# Patient Record
Sex: Male | Born: 1975 | Race: Black or African American | Hispanic: No | Marital: Married | Smoking: Never smoker
Health system: Southern US, Community
[De-identification: ages and names within clinical notes are randomized; demographics above are authoritative.]

## PROBLEM LIST (undated history)

## (undated) DIAGNOSIS — C801 Malignant (primary) neoplasm, unspecified: Secondary | ICD-10-CM

## (undated) DIAGNOSIS — E119 Type 2 diabetes mellitus without complications: Secondary | ICD-10-CM

## (undated) DIAGNOSIS — I10 Essential (primary) hypertension: Secondary | ICD-10-CM

## (undated) HISTORY — DX: Type 2 diabetes mellitus without complications: E11.9

## (undated) HISTORY — DX: Essential (primary) hypertension: I10

---

## 2013-02-14 ENCOUNTER — Ambulatory Visit (INDEPENDENT_AMBULATORY_CARE_PROVIDER_SITE_OTHER): Payer: 59 | Admitting: Family Medicine

## 2013-02-14 VITALS — BP 118/64 | HR 104 | Temp 98.1°F | Resp 18 | Ht 75.0 in | Wt 286.0 lb

## 2013-02-14 DIAGNOSIS — Z5189 Encounter for other specified aftercare: Secondary | ICD-10-CM

## 2013-02-14 DIAGNOSIS — Z0000 Encounter for general adult medical examination without abnormal findings: Secondary | ICD-10-CM

## 2013-02-14 LAB — POCT URINALYSIS DIPSTICK
Blood, UA: NEGATIVE
Glucose, UA: NEGATIVE
Nitrite, UA: NEGATIVE
Protein, UA: NEGATIVE
Spec Grav, UA: >=1.03
Urobilinogen, UA: 0.2
pH, UA: 5

## 2013-02-14 LAB — POCT CBC
Hemoglobin: 14.2 g/dL (ref 14.1–18.1)
MPV: 9.7 fL (ref 0–99.8)
POC Granulocyte: 3 (ref 2–6.9)
POC LYMPH PERCENT: 50.3 %L — AB (ref 10–50)
POC MID %: 6.8 %M (ref 0–12)
Platelet Count, POC: 274 10*3/uL (ref 142–424)
RBC: 4.9 M/uL (ref 4.69–6.13)
WBC: 6.9 10*3/uL (ref 4.6–10.2)

## 2013-02-14 LAB — POCT UA - MICROSCOPIC ONLY
Casts, Ur, LPF, POC: NEGATIVE
Crystals, Ur, HPF, POC: NEGATIVE
Mucus, UA: NEGATIVE

## 2013-02-14 LAB — POCT GLYCOSYLATED HEMOGLOBIN (HGB A1C): Hemoglobin A1C: 6.1

## 2013-02-14 NOTE — Progress Notes (Signed)
Subjective:    Patient ID: Edward Todd, male    DOB: 12-10-1975, 37 y.o.   MRN: [id number]  Chief Complaint  Patient presents with  . Annual Exam    pre employment   HPI HPI Comments: Edward Todd is a 37 y.o. 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 Patient reports that his last physical examination was approximately 1 year and 7 months ago. His last eye exam was 1 year and 4 months ago, no glaucoma or cataracts. His last dental appointment was 8 months ago. He denies ever having a colonoscopy performed or needing one.   He states that his last tetanus vaccination was last year. He states that he has recently received the full course of vaccinations for Hepatitis B. He states that he will be receiving a flu vaccination tomorrow.  He denies any pertinent medical history, surgeries, or hospitalizations. Patient reports a possible allergy to shellfish (shrimp, causes urticarial rash on the hands). He reports familial history of DM (maternal grandmother and paternal grandfather). He denies familial history of cardiac disease.   Patient is a non-smoker and does not consume alcohol. Patient reports that he exercises 3 days a week. He denies having any firearms at home. Patient reports that he has had 5 sexual partners throughout his life and is not currently sexually active. Patient has a 102 year old son. He reports that he has never been diagnosed with or treated for any STDs. Patient denies taking any medications daily.   He denies ear pain, tinnitus, dizziness, mouth sores, headaches, chest pain, palpitations, shortness of breath, cough, sleep apnea, recurrent sleep disturbances, shoulder or knee pain, neck pain, numbness, paresthesias, leg swelling, nausea, emesis, insomnia, depression, or anxiety.   History reviewed. No pertinent past medical history. No current outpatient prescriptions on file prior to  visit.   No current facility-administered medications on file prior to visit.   No Known Allergies  History   Social History  . Marital Status: Single    Spouse Name: N/A    Number of Children: N/A  . Years of Education: N/A   Occupational History  . Not on file.   Social History Main Topics  . Smoking status: Never Smoker   . Smokeless tobacco: Not on file  . Alcohol Use: No  . Drug Use: No  . Sexual Activity: Not on file   Other Topics Concern  . Not on file   Social History Narrative   Marital status:  Single; not dating.       Children:  1 child (2yo)      Lives: with son.      Employment: [employer] x 2 months. Moved from [place].      Tobacco:  None      Alcohol: none      Drugs: none      Exercise:  Three days per week.       Seatbelt:  100%; some texting n driving.      Guns:  None      Sexual activity:  5; no STDs; females only.         Family History  Problem Relation Age of Onset  . Diabetes Maternal Grandmother   . Diabetes Paternal Grandfather     Review of Systems  HENT: Negative for ear pain, mouth sores and tinnitus.   Respiratory: Negative for apnea, cough and shortness of breath.   Cardiovascular: Negative  for chest pain, palpitations and leg swelling.  Gastrointestinal: Negative for nausea and vomiting.  Musculoskeletal: Negative for arthralgias and neck pain.  Neurological: Negative for dizziness, numbness and headaches.  Psychiatric/Behavioral: Negative for sleep disturbance. The patient is not nervous/anxious.      BP 118/64  Pulse 104  Temp(Src) 98.1 F (36.7 C) (Oral)  Resp 18  Ht 6\' 3"  (1.905 m)  Wt 286 lb (129.729 kg)  BMI 35.75 kg/m2  SpO2 97%  Objective:   Physical Exam  Nursing note and vitals reviewed. Constitutional: He is oriented to person, place, and time. He appears well-developed and well-nourished. No distress.  HENT:  Head: Normocephalic and atraumatic.  Right Ear: External ear normal.  Left  Ear: External ear normal.  Nose: Nose normal.  Mouth/Throat: Oropharynx is clear and moist.  Eyes: Conjunctivae and EOM are normal. Pupils are equal, round, and reactive to light.  Neck: Normal range of motion. Neck supple. No thyromegaly present.  Full range of motion of the cervical spine without pain.   Cardiovascular: Normal rate, regular rhythm, normal heart sounds and intact distal pulses.  Exam reveals no gallop and no friction rub.   No murmur heard. 2+ DP pulses.   Pulmonary/Chest: Effort normal and breath sounds normal. No respiratory distress. He has no wheezes. He has no rales.  Abdominal: Soft. Bowel sounds are normal. He exhibits no distension and no mass. There is no tenderness. There is no rebound and no guarding. Hernia confirmed negative in the right inguinal area and confirmed negative in the left inguinal area.  Genitourinary: Testes normal and penis normal. No phimosis, paraphimosis, hypospadias, penile erythema or penile tenderness. No discharge found.  No inguinal hernias detected.  Musculoskeletal: Normal range of motion.  Lymphadenopathy:    He has no cervical adenopathy.  Neurological: He is alert and oriented to person, place, and time. He has normal reflexes. No cranial nerve deficit. Coordination normal.  2+ patellar reflexes bilaterally.   Skin: Skin is warm and dry. No rash noted. He is not diaphoretic.  Psychiatric: He has a normal mood and affect. His behavior is normal.       Assessment & Plan:  Routine general medical examination at a health care facility - Plan: POCT UA - Microscopic Only, POCT urinalysis dipstick, POCT CBC, HIV antibody, Comprehensive metabolic panel, TSH, RPR, GC/Chlamydia Probe Amp, POCT glycosylated hemoglobin (Hb A1C), Lipid panel  Unspecified convalescence - Plan: POCT UA - Microscopic Only, HIV antibody, RPR, GC/Chlamydia Probe Amp  1.  CPE: anticipatory guidance --- exercise, weight loss. Immunizations UTD.  Obtain labs. 2.  High risk sexual behavior/STD screening: counseling provided; obtain GC/CHLAM, RPR, HIV.  I personally performed the services described in this documentation, which was scribed in my presence.  The recorded information has been reviewed and is accurate.  Nilda Simmer, M.D.  [facility] [address] [place], [place]  [address] [phone] phone [phone] fax

## 2013-02-15 LAB — COMPREHENSIVE METABOLIC PANEL
ALT: 43 U/L (ref 0–53)
AST: 36 U/L (ref 0–37)
Alkaline Phosphatase: 104 U/L (ref 39–117)
Calcium: 10 mg/dL (ref 8.4–10.5)
Chloride: 106 mEq/L (ref 96–112)
Creat: 1.39 mg/dL — ABNORMAL HIGH (ref 0.50–1.35)
Glucose, Bld: 98 mg/dL (ref 70–99)
Potassium: 4.4 mEq/L (ref 3.5–5.3)
Total Bilirubin: 0.4 mg/dL (ref 0.3–1.2)

## 2013-02-15 LAB — LIPID PANEL
Cholesterol: 248 mg/dL — ABNORMAL HIGH (ref 0–200)
HDL: 34 mg/dL — ABNORMAL LOW (ref >39)
LDL Cholesterol: 167 mg/dL — ABNORMAL HIGH (ref 0–99)
Triglycerides: 235 mg/dL — ABNORMAL HIGH (ref <150)

## 2013-02-15 LAB — HIV ANTIBODY (ROUTINE TESTING W REFLEX): HIV: NONREACTIVE

## 2013-02-15 LAB — RPR

## 2013-02-15 LAB — GC/CHLAMYDIA PROBE AMP: GC Probe RNA: NEGATIVE

## 2013-02-25 ENCOUNTER — Telehealth: Payer: Self-pay

## 2013-02-25 NOTE — Telephone Encounter (Signed)
Pt called about his labs. Notified. Will call us back later on this week to set up a follow up appt.

## 2013-10-09 ENCOUNTER — Ambulatory Visit: Payer: 59 | Admitting: Podiatry

## 2013-10-23 ENCOUNTER — Ambulatory Visit (INDEPENDENT_AMBULATORY_CARE_PROVIDER_SITE_OTHER): Payer: 59

## 2013-10-23 ENCOUNTER — Ambulatory Visit (INDEPENDENT_AMBULATORY_CARE_PROVIDER_SITE_OTHER): Payer: 59 | Admitting: Podiatry

## 2013-10-23 DIAGNOSIS — R52 Pain, unspecified: Secondary | ICD-10-CM

## 2013-10-23 DIAGNOSIS — B351 Tinea unguium: Secondary | ICD-10-CM

## 2013-10-23 DIAGNOSIS — M2010 Hallux valgus (acquired), unspecified foot: Secondary | ICD-10-CM

## 2013-10-23 NOTE — Progress Notes (Signed)
   Subjective:    Patient ID: Edward Todd, male    DOB: 1976-02-14, 38 y.o.   MRN: [id number]  HPI  Discolored great toe nails on both feet, pt states he noticed it 8 months ago. Used anti-fungal otc treatments that were not effective.   Review of Systems  All other systems reviewed and are negative.      Objective:   Physical Exam        Assessment & Plan:

## 2013-10-24 NOTE — Progress Notes (Signed)
Subjective:     Patient ID: Edward Todd, male   DOB: [birth year], 38 y.o.   MRN: [id number]  HPI patient states that his big toenails are really thick on both feet and that they become irritative. Also complains about his bunion deformity stating the one on the right is worse than the left and it hurts especially with steel toe shoes or if he wears shoes that are too tight   Review of Systems  All other systems reviewed and are negative.      Objective:   Physical Exam  Nursing note and vitals reviewed. Cardiovascular: Intact distal pulses.   Musculoskeletal: Normal range of motion.  Neurological: He is alert.  Skin: Skin is warm.   neurovascular status intact with muscle strength adequate in range of motion normal. Digits are well-perfused and arch height is found to be diminished upon weightbearing and I did note discoloration of the big toenails both feet with no discoloration of digits 2 through 5 and no indication of mycosis of the adjacent toes. 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 Patient wants surgery and understands what would be required and will have him back for consult and tentatively scheduled for surgery in about 6 weeks as far as the nails go since they're nontender currently were to hold off on treatment and he will use topical antifungal

## 2013-11-13 ENCOUNTER — Ambulatory Visit: Payer: 59 | Admitting: Podiatry

## 2013-11-25 ENCOUNTER — Encounter: Payer: 59 | Admitting: Podiatry

## 2014-02-21 ENCOUNTER — Ambulatory Visit (INDEPENDENT_AMBULATORY_CARE_PROVIDER_SITE_OTHER): Payer: 59 | Admitting: Family Medicine

## 2014-02-21 VITALS — BP 128/80 | HR 83 | Temp 98.1°F | Resp 16 | Ht 76.0 in | Wt 288.4 lb

## 2014-02-21 DIAGNOSIS — Z0000 Encounter for general adult medical examination without abnormal findings: Secondary | ICD-10-CM
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
## 2014-02-21 NOTE — Patient Instructions (Signed)

## 2015-07-21 ENCOUNTER — Other Ambulatory Visit: Payer: Self-pay | Admitting: Occupational Medicine

## 2015-07-21 ENCOUNTER — Ambulatory Visit: Payer: Self-pay

## 2015-07-21 DIAGNOSIS — M79671 Pain in right foot: Secondary | ICD-10-CM

## 2015-07-27 ENCOUNTER — Ambulatory Visit: Payer: Self-pay

## 2015-07-27 ENCOUNTER — Other Ambulatory Visit: Payer: Self-pay | Admitting: Occupational Medicine

## 2015-07-27 DIAGNOSIS — M79671 Pain in right foot: Secondary | ICD-10-CM

## 2015-07-28 ENCOUNTER — Ambulatory Visit: Payer: Self-pay

## 2015-07-28 ENCOUNTER — Other Ambulatory Visit: Payer: Self-pay | Admitting: Occupational Medicine

## 2015-07-28 DIAGNOSIS — M79671 Pain in right foot: Secondary | ICD-10-CM

## 2015-10-09 ENCOUNTER — Ambulatory Visit (INDEPENDENT_AMBULATORY_CARE_PROVIDER_SITE_OTHER): Payer: BC Managed Care – PPO | Admitting: Internal Medicine

## 2015-10-09 VITALS — BP 120/96 | HR 68 | Temp 98.0°F | Resp 16 | Ht 75.5 in | Wt 279.2 lb

## 2015-10-09 DIAGNOSIS — R7989 Other specified abnormal findings of blood chemistry: Secondary | ICD-10-CM

## 2015-10-09 DIAGNOSIS — R748 Abnormal levels of other serum enzymes: Secondary | ICD-10-CM

## 2015-10-09 DIAGNOSIS — Z0000 Encounter for general adult medical examination without abnormal findings: Secondary | ICD-10-CM | POA: Diagnosis not present

## 2015-10-09 DIAGNOSIS — E785 Hyperlipidemia, unspecified: Secondary | ICD-10-CM | POA: Diagnosis not present

## 2015-10-09 LAB — CBC WITH DIFFERENTIAL/PLATELET
BASOS ABS: 0 {cells}/uL (ref 0–200)
Basophils Relative: 0 %
EOS ABS: 134 {cells}/uL (ref 15–500)
EOS PCT: 2 %
HCT: 43.3 % (ref 38.5–50.0)
HEMOGLOBIN: 14.8 g/dL (ref 13.2–17.1)
LYMPHS ABS: 3283 {cells}/uL (ref 850–3900)
Lymphocytes Relative: 49 %
MCH: 29.4 pg (ref 27.0–33.0)
MCHC: 34.2 g/dL (ref 32.0–36.0)
MCV: 85.9 fL (ref 80.0–100.0)
MPV: 10.7 fL (ref 7.5–12.5)
Monocytes Absolute: 469 cells/uL (ref 200–950)
Monocytes Relative: 7 %
NEUTROS PCT: 42 %
Neutro Abs: 2814 cells/uL (ref 1500–7800)
Platelets: 268 10*3/uL (ref 140–400)
RBC: 5.04 MIL/uL (ref 4.20–5.80)
RDW: 13.2 % (ref 11.0–15.0)
WBC: 6.7 10*3/uL (ref 3.8–10.8)

## 2015-10-09 LAB — COMPREHENSIVE METABOLIC PANEL
ALBUMIN: 4.5 g/dL (ref 3.6–5.1)
ALT: 27 U/L (ref 9–46)
AST: 27 U/L (ref 10–40)
Alkaline Phosphatase: 85 U/L (ref 40–115)
BUN: 14 mg/dL (ref 7–25)
CHLORIDE: 107 mmol/L (ref 98–110)
CO2: 25 mmol/L (ref 20–31)
Calcium: 9.8 mg/dL (ref 8.6–10.3)
Creat: 1.17 mg/dL (ref 0.60–1.35)
Glucose, Bld: 113 mg/dL — ABNORMAL HIGH (ref 65–99)
POTASSIUM: 4.1 mmol/L (ref 3.5–5.3)
SODIUM: 141 mmol/L (ref 135–146)
TOTAL PROTEIN: 7.6 g/dL (ref 6.1–8.1)
Total Bilirubin: 0.4 mg/dL (ref 0.2–1.2)

## 2015-10-09 LAB — LIPID PANEL
CHOL/HDL RATIO: 5 ratio (ref <=5.0)
CHOLESTEROL: 219 mg/dL — AB (ref 125–200)
HDL: 44 mg/dL (ref >=40)
LDL CALC: 152 mg/dL — AB (ref <130)
Triglycerides: 114 mg/dL (ref <150)
VLDL: 23 mg/dL (ref <30)

## 2015-10-09 NOTE — Progress Notes (Signed)
   Subjective:    Patient ID: Edward Todd, male    DOB: 1975/08/08, 40 y.o.   MRN: [id number]  HPI    Review of Systems  Constitutional: Negative.   HENT: Negative.   Eyes: Negative.   Respiratory: Negative.   Cardiovascular: Negative.   Gastrointestinal: Negative.   Endocrine: Negative.   Genitourinary: Negative.   Musculoskeletal: Negative.   Skin: Negative.   Allergic/Immunologic: Negative.   Neurological: Negative.   Hematological: Negative.   Psychiatric/Behavioral: Negative.        Objective:   Physical Exam        Assessment & Plan:
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
## 2015-10-09 NOTE — Patient Instructions (Signed)
     IF you received an x-ray today, you will receive an invoice from [facility]. Please contact [facility] at [phone] with questions or concerns regarding your invoice.   IF you received labwork today, you will receive an invoice from [facility]. Please contact [facility] at [phone] with questions or concerns regarding your invoice.   Our billing staff will not be able to assist you with questions regarding bills from these companies.  You will be contacted with the lab results as soon as they are available. The fastest way to get your results is to activate your My Chart account. Instructions are located on the last page of this paperwork. If you have not heard from us regarding the results in 2 weeks, please contact this office.      

## 2015-10-10 ENCOUNTER — Encounter: Payer: Self-pay | Admitting: Internal Medicine

## 2015-10-10 LAB — PSA: PSA: 0.76 ng/mL (ref <=4.00)

## 2015-12-09 ENCOUNTER — Other Ambulatory Visit: Payer: Self-pay | Admitting: Occupational Medicine

## 2015-12-09 ENCOUNTER — Ambulatory Visit: Payer: Self-pay

## 2015-12-09 DIAGNOSIS — R52 Pain, unspecified: Secondary | ICD-10-CM

## 2016-02-25 ENCOUNTER — Ambulatory Visit (INDEPENDENT_AMBULATORY_CARE_PROVIDER_SITE_OTHER): Payer: BC Managed Care – PPO | Admitting: Physician Assistant

## 2016-02-25 VITALS — BP 124/84 | HR 72 | Temp 97.6°F | Resp 16 | Ht 75.5 in | Wt 282.2 lb

## 2016-02-25 DIAGNOSIS — J22 Unspecified acute lower respiratory infection: Secondary | ICD-10-CM | POA: Diagnosis not present

## 2016-02-25 MED ORDER — BENZONATATE 100 MG PO CAPS: 100.0000 mg | ORAL_CAPSULE | Freq: Three times a day (TID) | ORAL | 0 refills | Status: DC | PRN

## 2016-02-25 MED ORDER — ALBUTEROL SULFATE HFA 108 (90 BASE) MCG/ACT IN AERS: 2.0000 | INHALATION_SPRAY | RESPIRATORY_TRACT | 1 refills | Status: DC | PRN

## 2016-02-25 MED ORDER — GUAIFENESIN ER 1200 MG PO TB12: 1.0000 | ORAL_TABLET | Freq: Two times a day (BID) | ORAL | 1 refills | Status: DC | PRN

## 2016-02-25 MED ORDER — AZITHROMYCIN 250 MG PO TABS: ORAL_TABLET | ORAL | 0 refills | Status: DC

## 2016-02-25 NOTE — Progress Notes (Signed)
[facility] [address] [place] [address] [phone]  Date:  02/25/2016   Name:  ABASS MAGAN   DOB:  24-Jul-1975   MRN:  [id number]  PCP:  Ivar Drape, PA    History of Present Illness:  Malcom ISHAMEL GARRIS is a 40 y.o. male patient who presents to [facility] for cc of cough. Patient reports 2 weeks of worsening cough and congestion. He has a tight cough that appears to be getting heavier and heavier. It is a productive cough of green phlegm. Coughing is worse at night. The cough has caused a headache and he also has slight abdominal wall pain with coughing episodes. There is winded Ms. when he is exercising. 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 He will have coughing in addition when he tries to work out. He denies any fever. There is no throat pain. There is minimal nasal congestion. He has a sick contact at work. He has attempted NyQuil, [employer], Emergen-C, and TheraFlu. These have helped minimally.      There are no active problems to display for this patient.   No past medical history on file.  No past surgical history on file.  Social History  Substance Use Topics  . Smoking status: Never Smoker  . Smokeless tobacco: Not on file  . Alcohol use No    Family History  Problem Relation Age of Onset  . Diabetes Maternal Grandmother   . Diabetes Paternal Grandfather     Allergies  Allergen Reactions  . Shellfish Allergy Rash    Medication list has been reviewed and updated.  No current outpatient prescriptions on file prior to visit.   No current facility-administered medications on file prior to visit.     ROS ROS otherwise unremarkable unless listed above.  Physical Examination: BP 124/84 (BP Location: Right Arm, Patient Position: Sitting, Cuff Size: Large)   Pulse 72   Temp 97.6 F (36.4 C) (Oral)   Resp 16   Ht 6' 3.5" (1.918 m)   Wt 282 lb 3.2 oz (128 kg)   SpO2 97%   BMI  34.81 kg/m  Ideal Body Weight: Weight in (lb) to have BMI = 25: 202.3  Physical Exam  Constitutional: He is oriented to person, place, and time. He appears well-developed and well-nourished. No distress.  HENT:  Head: Atraumatic.  Right Ear: Tympanic membrane, external ear and ear canal normal.  Left Ear: Tympanic membrane, external ear and ear canal normal.  Nose: Mucosal edema and rhinorrhea present. Right sinus exhibits no maxillary sinus tenderness and no frontal sinus tenderness. Left sinus exhibits no maxillary sinus tenderness and no frontal sinus tenderness.  Mouth/Throat: No uvula swelling. No oropharyngeal exudate, posterior oropharyngeal edema or posterior oropharyngeal erythema.  Eyes: Conjunctivae, EOM and lids are normal. Pupils are equal, round, and reactive to light. Right eye exhibits normal extraocular motion. Left eye exhibits normal extraocular motion.  Neck: Trachea normal and full passive range of motion without pain. No edema and no erythema present.  Cardiovascular: Normal rate and regular rhythm.  Exam reveals no friction rub.   No murmur heard. Pulmonary/Chest: Effort normal. No respiratory distress. He has no decreased breath sounds. He has no wheezes. He has no rhonchi.  Sounds are minimally amphoric.  Neurological: He is alert and oriented to person, place, and time.  Skin: Skin is warm and dry. He is not diaphoretic.  Psychiatric: He has a normal  mood and affect. His behavior is normal.     Assessment and Plan: Malcom ZATHAN BERTH is a 40 y.o. male who is here today for cough for 2 weeks. We will treat for possible bacterial etiology though this could be a viral cough, however symptoms are worsening. We will give him azithromycin and Z-Pak form today. I've also advised using Mucinex and to increase his hydration to 64 ounces of water. Given Tessalon Perles for cough. I would also advised that he can use an albuterol if he begins to have coughing in his  workouts. Return to clinic as needed.   Lower respiratory infection (e.g., bronchitis, pneumonia, pneumonitis, pulmonitis) - Plan: Guaifenesin (MUCINEX MAXIMUM STRENGTH) 1200 MG TB12, azithromycin (ZITHROMAX) 250 MG tablet, albuterol (PROVENTIL HFA;VENTOLIN HFA) 108 (90 Base) MCG/ACT inhaler, benzonatate (TESSALON) 100 MG capsule  Ivar Drape, PA-C [facility] 11/2/201712:12 PM

## 2016-02-25 NOTE — Patient Instructions (Addendum)
Please hydrate well with 64 oz of water if not more.   You can try the delsym to see if this will help with cough. Take the antibiotic to completion.   You can use the albuterol if you develop a coughing fit, or windedness in exercise.   Acute Bronchitis Bronchitis is inflammation of the airways that extend from the windpipe into the lungs (bronchi). The inflammation often causes mucus to develop. This leads to a cough, which is the most common symptom of bronchitis.  In acute bronchitis, the condition usually develops suddenly and goes away over time, usually in a couple weeks. Smoking, allergies, and asthma can make bronchitis worse. Repeated episodes of bronchitis may cause further lung problems.  CAUSES Acute bronchitis is most often caused by the same virus that causes a cold. The virus can spread from person to person (contagious) through coughing, sneezing, and touching contaminated objects. SIGNS AND SYMPTOMS   Cough.   Fever.   Coughing up mucus.   Body aches.   Chest congestion.   Chills.   Shortness of breath.   Sore throat.  DIAGNOSIS  Acute bronchitis is usually diagnosed through a physical exam. Your health care provider will also ask you questions about your medical history. Tests, such as chest X-rays, are sometimes done to rule out other conditions.  TREATMENT  Acute bronchitis usually goes away in a couple weeks. Oftentimes, no medical treatment is necessary. Medicines are sometimes given for relief of fever or cough. Antibiotic medicines are usually not needed but may be prescribed in certain situations. In some cases, an inhaler may be recommended to help reduce shortness of breath and control the cough. A cool mist vaporizer may also be used to help thin bronchial secretions and make it easier to clear the chest.  HOME CARE INSTRUCTIONS  Get plenty of rest.   Drink enough fluids to keep your urine clear or pale yellow (unless you have a medical  condition that requires fluid restriction). Increasing fluids may help thin your respiratory secretions (sputum) and reduce chest congestion, and it will prevent dehydration.   Take medicines only as directed by your health care provider.  If you were prescribed an antibiotic medicine, finish it all even if you start to feel better.  Avoid smoking and secondhand smoke. Exposure to cigarette smoke or irritating chemicals will make bronchitis worse. If you are a smoker, consider using nicotine gum or skin patches to help control withdrawal symptoms. Quitting smoking will help your lungs heal faster.   Reduce the chances of another bout of acute bronchitis by washing your hands frequently, avoiding people with cold symptoms, and trying not to touch your hands to your mouth, nose, or eyes.   Keep all follow-up visits as directed by your health care provider.  SEEK MEDICAL CARE IF: Your symptoms do not improve after 1 week of treatment.  SEEK IMMEDIATE MEDICAL CARE IF:  You develop an increased fever or chills.   You have chest pain.   You have severe shortness of breath.  You have bloody sputum.   You develop dehydration.  You faint or repeatedly feel like you are going to pass out.  You develop repeated vomiting.  You develop a severe headache. MAKE SURE YOU:   Understand these instructions.  Will watch your condition.  Will get help right away if you are not doing well or get worse.   This information is not intended to replace advice given to you by your health care provider.  Make sure you discuss any questions you have with your health care provider.   Document Released: 05/19/2004 Document Revised: 05/02/2014 Document Reviewed: 10/02/2012 Elsevier Interactive Patient Education 2016 [employer].     IF you received an x-ray today, you will receive an invoice from [facility]. Please contact [facility] at [phone] with questions or concerns  regarding your invoice.   IF you received labwork today, you will receive an invoice from [employer]. Please contact [facility] at [phone] with questions or concerns regarding your invoice.   Our billing staff will not be able to assist you with questions regarding bills from these companies.  You will be contacted with the lab results as soon as they are available. The fastest way to get your results is to activate your My Chart account. Instructions are located on the last page of this paperwork. If you have not heard from [place] regarding the results in 2 weeks, please contact this office.

## 2016-03-21 ENCOUNTER — Other Ambulatory Visit: Payer: Self-pay | Admitting: Urgent Care

## 2016-03-21 ENCOUNTER — Ambulatory Visit (INDEPENDENT_AMBULATORY_CARE_PROVIDER_SITE_OTHER): Payer: Self-pay | Admitting: Urgent Care

## 2016-03-21 VITALS — BP 122/80 | HR 63 | Temp 98.1°F | Resp 18 | Ht 75.5 in | Wt 288.2 lb

## 2016-03-21 DIAGNOSIS — J22 Unspecified acute lower respiratory infection: Secondary | ICD-10-CM

## 2016-03-21 DIAGNOSIS — Z024 Encounter for examination for driving license: Secondary | ICD-10-CM

## 2016-03-21 MED ORDER — BENZONATATE 100 MG PO CAPS: 100.0000 mg | ORAL_CAPSULE | Freq: Three times a day (TID) | ORAL | 0 refills | Status: DC | PRN

## 2016-03-21 NOTE — Patient Instructions (Addendum)
Keeping you healthy  Get these tests  Blood pressure- Have your blood pressure checked once a year by your healthcare provider.  Normal blood pressure is 120/80.  Weight- Have your body mass index (BMI) calculated to screen for obesity.  BMI is a measure of body fat based on height and weight. You can also calculate your own BMI at [employer]velB[employer].it.  Cholesterol- Have your cholesterol checked regularly starting at age 41, sooner may be necessary if you have diabetes, high blood pressure, if a family member developed heart diseases at an early age or if you smoke.   Chlamydia, HIV, and other sexual transmitted disease- Get screened each year until the age of 67 then within three months of each new sexual partner.  Diabetes- Have your blood sugar checked regularly if you have high blood pressure, high cholesterol, a family history of diabetes or if you are overweight.  Get these vaccines  Flu shot- Every fall.  Tetanus shot- Every 10 years.  Menactra- Single dose; prevents meningitis.  Take these steps  Don't smoke- If you do smoke, ask your healthcare provider about quitting. For tips on how to quit, go to www.smokefree.gov or call 1-800-QUIT-NOW.  Be physically active- Exercise 5 days a week for at least 30 minutes.  If you are not already physically active start slow and gradually work up to 30 minutes of moderate physical activity.  Examples of moderate activity include walking briskly, mowing the yard, dancing, swimming bicycling, etc.  Eat a healthy diet- Eat a variety of healthy foods such as fruits, vegetables, low fat milk, low fat cheese, yogurt, lean meats, poultry, fish, beans, tofu, etc.  For more information on healthy eating, go to www.thenutritionsource.org  Drink alcohol in moderation- Limit alcohol intake two drinks or less a day.  Never drink and drive.  Dentist- Brush and floss teeth twice daily; visit your dentis twice a year.  Depression-Your emotional  health is as important as your physical health.  If you're feeling down, losing interest in things you normally enjoy please talk with your healthcare provider.  Gun Safety- If you keep a gun in your home, keep it unloaded and with the safety lock on.  Bullets should be stored separately.  Helmet use- Always wear a helmet when riding a motorcycle, bicycle, rollerblading or skateboarding.  Safe sex- If you may be exposed to a sexually transmitted infection, use a condom  Seat belts- Seat bels can save your life; always wear one.  Smoke/Carbon Monoxide detectors- These detectors need to be installed on the appropriate level of your home.  Replace batteries at least once a year.  Skin Cancer- When out in the sun, cover up and use sunscreen SPF 15 or higher.  Violence- If anyone is threatening or hurting you, please tell your healthcare provider.    IF you received an x-ray today, you will receive an invoice from [facility]. Please contact [facility] at [phone] with questions or concerns regarding your invoice.   IF you received labwork today, you will receive an invoice from [employer]. Please contact [facility] at [phone] with questions or concerns regarding your invoice.   Our billing staff will not be able to assist you with questions regarding bills from these companies.  You will be contacted with the lab results as soon as they are available. The fastest way to get your results is to activate your My Chart account. Instructions are located on the last page of this paperwork. If you have  not heard from [place] regarding the results in 2 weeks, please contact this office.

## 2016-03-21 NOTE — Progress Notes (Signed)
Commercial Driver Medical Examination   Edward Todd is a 40 y.o. male who presents today for a DOT physical exam. The patient reports using albuterol inhaler short term for a respiratory infection. Denies dizziness, chronic headache, blurred vision, chest pain, shortness of breath, heart racing, palpitations, nausea, vomiting, abdominal pain, hematuria, lower leg swelling. Denies smoking cigarettes or drinking alcohol.   The following portions of the patient's history were reviewed and updated as appropriate: allergies, current medications, past family history, past medical history, past social history and past surgical history.  Objective:   BP 122/80 (BP Location: Right Arm, Patient Position: Sitting, Cuff Size: Large)   Pulse 63   Temp 98.1 F (36.7 C) (Oral)   Resp 18   Ht 6' 3.5" (1.918 m)   Wt 288 lb 3.2 oz (130.7 kg)   SpO2 98%   BMI 35.55 kg/m   Vision/hearing:  Visual Acuity Screening   Right eye Left eye Both eyes  Without correction:     With correction: 20/20 20/15 20/15   Comments: 85 degrees peripheral  Hearing Screening Comments: Hears a whisper at 10 feet   Patient can recognize and distinguish among traffic control signals and devices showing standard red, green, and amber colors.  Corrective lenses required: Yes  Monocular Vision?: No  Hearing aid requirement: No  Physical Exam  Constitutional: He is oriented to person, place, and time. He appears well-developed and well-nourished.  HENT:  TM's intact bilaterally, no effusions or erythema. Nasal turbinates pink and moist, nasal passages patent. No sinus tenderness. Oropharynx clear, mucous membranes moist, dentition in good repair.  Eyes: Conjunctivae and EOM are normal. Pupils are equal, round, and reactive to light. Right eye exhibits no discharge. Left eye exhibits no discharge. No scleral icterus.  Neck: Normal range of motion. Neck supple. No thyromegaly present.  Cardiovascular: Normal rate,  regular rhythm and intact distal pulses.  Exam reveals no gallop and no friction rub.   No murmur heard. Pulmonary/Chest: No stridor. No respiratory distress. He has no wheezes. He has no rales.  Abdominal: Soft. Bowel sounds are normal. He exhibits no distension and no mass. There is no tenderness.  Musculoskeletal: Normal range of motion. He exhibits no edema or tenderness.  Lymphadenopathy:    He has no cervical adenopathy.  Neurological: He is alert and oriented to person, place, and time. He has normal reflexes.  Skin: Skin is warm and dry. No rash noted. No erythema. No pallor.  Psychiatric: He has a normal mood and affect.   Labs: Urine - Sp Gr: 1.015, Bl: neg, Sug: neg, Pro: neg  Assessment:   Healthy male exam.  Meets standards in 34 CFR 391.41;  qualifies for 2 year certificate.   Plan:   Medical examiners certificate completed and printed. Return as needed.

## 2017-07-26 ENCOUNTER — Encounter: Payer: Self-pay | Admitting: Physician Assistant

## 2017-12-20 DIAGNOSIS — J40 Bronchitis, not specified as acute or chronic: Secondary | ICD-10-CM | POA: Diagnosis not present

## 2017-12-20 DIAGNOSIS — J0140 Acute pansinusitis, unspecified: Secondary | ICD-10-CM | POA: Diagnosis not present

## 2018-03-12 DIAGNOSIS — J029 Acute pharyngitis, unspecified: Secondary | ICD-10-CM | POA: Diagnosis not present

## 2018-03-23 DIAGNOSIS — B009 Herpesviral infection, unspecified: Secondary | ICD-10-CM | POA: Diagnosis not present

## 2018-03-23 DIAGNOSIS — E1365 Other specified diabetes mellitus with hyperglycemia: Secondary | ICD-10-CM | POA: Diagnosis not present

## 2018-03-23 DIAGNOSIS — R69 Illness, unspecified: Secondary | ICD-10-CM | POA: Diagnosis not present

## 2018-08-20 ENCOUNTER — Other Ambulatory Visit: Payer: Self-pay | Admitting: Emergency Medicine

## 2018-08-20 DIAGNOSIS — S39012A Strain of muscle, fascia and tendon of lower back, initial encounter: Secondary | ICD-10-CM

## 2018-08-29 ENCOUNTER — Other Ambulatory Visit: Payer: Self-pay | Admitting: Family Medicine

## 2018-08-29 DIAGNOSIS — R05 Cough: Secondary | ICD-10-CM

## 2018-08-29 DIAGNOSIS — R059 Cough, unspecified: Secondary | ICD-10-CM

## 2018-08-30 ENCOUNTER — Inpatient Hospital Stay (HOSPITAL_COMMUNITY)
Admission: EM | Admit: 2018-08-30 | Discharge: 2018-09-07 | DRG: 824 | Disposition: A | Discharge status: Rehabilitation Facility | Payer: 59 | Attending: Internal Medicine | Admitting: Internal Medicine

## 2018-08-30 ENCOUNTER — Encounter (HOSPITAL_COMMUNITY): Payer: Self-pay | Admitting: Emergency Medicine

## 2018-08-30 ENCOUNTER — Other Ambulatory Visit: Payer: Self-pay

## 2018-08-30 ENCOUNTER — Emergency Department (HOSPITAL_COMMUNITY): Payer: 59

## 2018-08-30 DIAGNOSIS — R29898 Other symptoms and signs involving the musculoskeletal system: Secondary | ICD-10-CM | POA: Insufficient documentation

## 2018-08-30 DIAGNOSIS — Z1159 Encounter for screening for other viral diseases: Secondary | ICD-10-CM | POA: Diagnosis not present

## 2018-08-30 DIAGNOSIS — E1169 Type 2 diabetes mellitus with other specified complication: Secondary | ICD-10-CM | POA: Diagnosis not present

## 2018-08-30 DIAGNOSIS — E119 Type 2 diabetes mellitus without complications: Secondary | ICD-10-CM

## 2018-08-30 DIAGNOSIS — M549 Dorsalgia, unspecified: Secondary | ICD-10-CM

## 2018-08-30 DIAGNOSIS — T40605A Adverse effect of unspecified narcotics, initial encounter: Secondary | ICD-10-CM | POA: Diagnosis not present

## 2018-08-30 DIAGNOSIS — G479 Sleep disorder, unspecified: Secondary | ICD-10-CM | POA: Diagnosis not present

## 2018-08-30 DIAGNOSIS — Z91013 Allergy to seafood: Secondary | ICD-10-CM

## 2018-08-30 DIAGNOSIS — R030 Elevated blood-pressure reading, without diagnosis of hypertension: Secondary | ICD-10-CM | POA: Diagnosis not present

## 2018-08-30 DIAGNOSIS — D649 Anemia, unspecified: Secondary | ICD-10-CM | POA: Diagnosis present

## 2018-08-30 DIAGNOSIS — M62838 Other muscle spasm: Secondary | ICD-10-CM | POA: Diagnosis present

## 2018-08-30 DIAGNOSIS — K5903 Drug induced constipation: Secondary | ICD-10-CM | POA: Diagnosis not present

## 2018-08-30 DIAGNOSIS — E860 Dehydration: Secondary | ICD-10-CM | POA: Diagnosis present

## 2018-08-30 DIAGNOSIS — Z833 Family history of diabetes mellitus: Secondary | ICD-10-CM

## 2018-08-30 DIAGNOSIS — R5381 Other malaise: Secondary | ICD-10-CM | POA: Diagnosis not present

## 2018-08-30 DIAGNOSIS — D72819 Decreased white blood cell count, unspecified: Secondary | ICD-10-CM | POA: Diagnosis present

## 2018-08-30 DIAGNOSIS — Z6835 Body mass index (BMI) 35.0-35.9, adult: Secondary | ICD-10-CM

## 2018-08-30 DIAGNOSIS — N289 Disorder of kidney and ureter, unspecified: Secondary | ICD-10-CM | POA: Diagnosis not present

## 2018-08-30 DIAGNOSIS — R509 Fever, unspecified: Secondary | ICD-10-CM | POA: Diagnosis not present

## 2018-08-30 DIAGNOSIS — M48061 Spinal stenosis, lumbar region without neurogenic claudication: Secondary | ICD-10-CM | POA: Diagnosis present

## 2018-08-30 DIAGNOSIS — E871 Hypo-osmolality and hyponatremia: Secondary | ICD-10-CM | POA: Diagnosis present

## 2018-08-30 DIAGNOSIS — E669 Obesity, unspecified: Secondary | ICD-10-CM | POA: Diagnosis not present

## 2018-08-30 DIAGNOSIS — G8929 Other chronic pain: Secondary | ICD-10-CM | POA: Diagnosis not present

## 2018-08-30 DIAGNOSIS — I10 Essential (primary) hypertension: Secondary | ICD-10-CM | POA: Diagnosis present

## 2018-08-30 DIAGNOSIS — C9000 Multiple myeloma not having achieved remission: Principal | ICD-10-CM | POA: Diagnosis present

## 2018-08-30 DIAGNOSIS — M8448XA Pathological fracture, other site, initial encounter for fracture: Secondary | ICD-10-CM | POA: Diagnosis present

## 2018-08-30 DIAGNOSIS — D630 Anemia in neoplastic disease: Secondary | ICD-10-CM | POA: Diagnosis present

## 2018-08-30 DIAGNOSIS — R0989 Other specified symptoms and signs involving the circulatory and respiratory systems: Secondary | ICD-10-CM | POA: Diagnosis not present

## 2018-08-30 DIAGNOSIS — E1165 Type 2 diabetes mellitus with hyperglycemia: Secondary | ICD-10-CM | POA: Diagnosis present

## 2018-08-30 DIAGNOSIS — S22000A Wedge compression fracture of unspecified thoracic vertebra, initial encounter for closed fracture: Secondary | ICD-10-CM | POA: Diagnosis not present

## 2018-08-30 DIAGNOSIS — M545 Low back pain, unspecified: Secondary | ICD-10-CM | POA: Insufficient documentation

## 2018-08-30 DIAGNOSIS — E1129 Type 2 diabetes mellitus with other diabetic kidney complication: Secondary | ICD-10-CM | POA: Diagnosis not present

## 2018-08-30 DIAGNOSIS — D638 Anemia in other chronic diseases classified elsewhere: Secondary | ICD-10-CM | POA: Diagnosis present

## 2018-08-30 DIAGNOSIS — N179 Acute kidney failure, unspecified: Secondary | ICD-10-CM | POA: Diagnosis not present

## 2018-08-30 DIAGNOSIS — K5900 Constipation, unspecified: Secondary | ICD-10-CM | POA: Diagnosis present

## 2018-08-30 DIAGNOSIS — R7309 Other abnormal glucose: Secondary | ICD-10-CM | POA: Diagnosis not present

## 2018-08-30 DIAGNOSIS — E8809 Other disorders of plasma-protein metabolism, not elsewhere classified: Secondary | ICD-10-CM | POA: Diagnosis present

## 2018-08-30 LAB — BASIC METABOLIC PANEL
Anion gap: 8 (ref 5–15)
BUN: 20 mg/dL (ref 6–20)
CO2: 23 mmol/L (ref 22–32)
Calcium: 9.7 mg/dL (ref 8.9–10.3)
Chloride: 101 mmol/L (ref 98–111)
Creatinine, Ser: 1.21 mg/dL (ref 0.61–1.24)
GFR calc Af Amer: >60 mL/min (ref >60)
GFR calc non Af Amer: >60 mL/min (ref >60)
Glucose, Bld: 114 mg/dL — ABNORMAL HIGH (ref 70–99)
Potassium: 4 mmol/L (ref 3.5–5.1)
Sodium: 132 mmol/L — ABNORMAL LOW (ref 135–145)

## 2018-08-30 LAB — CBC WITH DIFFERENTIAL/PLATELET
Abs Immature Granulocytes: 0.02 10*3/uL (ref 0.00–0.07)
Basophils Absolute: 0 10*3/uL (ref 0.0–0.1)
Basophils Relative: 0 %
Eosinophils Absolute: 0 10*3/uL (ref 0.0–0.5)
Eosinophils Relative: 0 %
HCT: 23 % — ABNORMAL LOW (ref 39.0–52.0)
Hemoglobin: 7.6 g/dL — ABNORMAL LOW (ref 13.0–17.0)
Immature Granulocytes: 0 %
Lymphocytes Relative: 46 %
Lymphs Abs: 2.2 10*3/uL (ref 0.7–4.0)
MCH: 30.2 pg (ref 26.0–34.0)
MCHC: 33 g/dL (ref 30.0–36.0)
MCV: 91.3 fL (ref 80.0–100.0)
Monocytes Absolute: 0.4 10*3/uL (ref 0.1–1.0)
Monocytes Relative: 9 %
Neutro Abs: 2.2 10*3/uL (ref 1.7–7.7)
Neutrophils Relative %: 45 %
Platelets: 206 10*3/uL (ref 150–400)
RBC: 2.52 MIL/uL — ABNORMAL LOW (ref 4.22–5.81)
RDW: 13.9 % (ref 11.5–15.5)
WBC: 4.9 10*3/uL (ref 4.0–10.5)
nRBC: 0.4 % — ABNORMAL HIGH (ref 0.0–0.2)

## 2018-08-30 LAB — URINALYSIS, ROUTINE W REFLEX MICROSCOPIC
Bacteria, UA: NONE SEEN
Bilirubin Urine: NEGATIVE
Glucose, UA: NEGATIVE mg/dL
Hgb urine dipstick: NEGATIVE
Ketones, ur: NEGATIVE mg/dL
Leukocytes,Ua: NEGATIVE
Nitrite: NEGATIVE
Protein, ur: 30 mg/dL — AB
Specific Gravity, Urine: 1.019 (ref 1.005–1.030)
pH: 5 (ref 5.0–8.0)

## 2018-08-30 LAB — HEPATIC FUNCTION PANEL
ALT: 13 U/L (ref 0–44)
AST: 20 U/L (ref 15–41)
Albumin: 2.9 g/dL — ABNORMAL LOW (ref 3.5–5.0)
Alkaline Phosphatase: 50 U/L (ref 38–126)
Bilirubin, Direct: 0.1 mg/dL (ref 0.0–0.2)
Indirect Bilirubin: 0.6 mg/dL (ref 0.3–0.9)
Total Bilirubin: 0.7 mg/dL (ref 0.3–1.2)
Total Protein: >12 g/dL — ABNORMAL HIGH (ref 6.5–8.1)

## 2018-08-30 LAB — DIC (DISSEMINATED INTRAVASCULAR COAGULATION)PANEL
D-Dimer, Quant: 0.88 ug/mL-FEU — ABNORMAL HIGH (ref 0.00–0.50)
Fibrinogen: 365 mg/dL (ref 210–475)
INR: 1.3 — ABNORMAL HIGH (ref 0.8–1.2)
Platelets: 215 10*3/uL (ref 150–400)
Prothrombin Time: 15.7 seconds — ABNORMAL HIGH (ref 11.4–15.2)
Smear Review: NONE SEEN
aPTT: 28 seconds (ref 24–36)

## 2018-08-30 LAB — RETICULOCYTES
Immature Retic Fract: 16.8 % — ABNORMAL HIGH (ref 2.3–15.9)
RBC.: 2.49 MIL/uL — ABNORMAL LOW (ref 4.22–5.81)
Retic Count, Absolute: 31.9 10*3/uL (ref 19.0–186.0)
Retic Ct Pct: 1.3 % (ref 0.4–3.1)

## 2018-08-30 LAB — FOLATE: Folate: 9.9 ng/mL (ref >5.9)

## 2018-08-30 LAB — TYPE AND SCREEN
ABO/RH(D): O POS
Antibody Screen: NEGATIVE

## 2018-08-30 LAB — SARS CORONAVIRUS 2 BY RT PCR (HOSPITAL ORDER, PERFORMED IN ~~LOC~~ HOSPITAL LAB): SARS Coronavirus 2: NEGATIVE

## 2018-08-30 LAB — VITAMIN B12: Vitamin B-12: 250 pg/mL (ref 180–914)

## 2018-08-30 LAB — FERRITIN: Ferritin: 360 ng/mL — ABNORMAL HIGH (ref 24–336)

## 2018-08-30 MED ORDER — CYCLOBENZAPRINE HCL 5 MG PO TABS
5.0000 mg | ORAL_TABLET | Freq: Three times a day (TID) | ORAL | Status: DC | PRN
  Administered 2018-08-30 – 2018-09-07 (×12): 5 mg via ORAL
  Filled 2018-08-30 (×12): qty 1

## 2018-08-30 MED ORDER — MORPHINE SULFATE (PF) 4 MG/ML IV SOLN
4.0000 mg | Freq: Once | INTRAVENOUS | Status: AC
  Administered 2018-08-30: 4 mg via INTRAVENOUS
  Filled 2018-08-30: qty 1

## 2018-08-30 MED ORDER — SODIUM CHLORIDE 0.45 % IV SOLN
INTRAVENOUS | Status: AC
  Administered 2018-08-30: 18:00:00 via INTRAVENOUS

## 2018-08-30 MED ORDER — LORAZEPAM 2 MG/ML IJ SOLN
1.0000 mg | Freq: Once | INTRAMUSCULAR | Status: AC
  Administered 2018-08-30: 1 mg via INTRAVENOUS
  Filled 2018-08-30: qty 1

## 2018-08-30 MED ORDER — GADOBUTROL 1 MMOL/ML IV SOLN
10.0000 mL | Freq: Once | INTRAVENOUS | Status: AC | PRN
  Administered 2018-08-30: 10 mL via INTRAVENOUS

## 2018-08-30 MED ORDER — HYDROMORPHONE HCL 1 MG/ML IJ SOLN
0.5000 mg | INTRAMUSCULAR | Status: DC | PRN
  Administered 2018-08-30 – 2018-09-07 (×25): 0.5 mg via INTRAVENOUS
  Filled 2018-08-30 (×25): qty 0.5

## 2018-08-30 MED ORDER — SODIUM CHLORIDE 0.9 % IV BOLUS
1000.0000 mL | Freq: Once | INTRAVENOUS | Status: AC
  Administered 2018-08-30: 1000 mL via INTRAVENOUS

## 2018-08-30 MED ORDER — HYDRALAZINE HCL 20 MG/ML IJ SOLN
5.0000 mg | INTRAMUSCULAR | Status: DC | PRN
  Administered 2018-08-30 – 2018-08-31 (×2): 5 mg via INTRAVENOUS
  Filled 2018-08-30 (×2): qty 1

## 2018-08-30 NOTE — ED Provider Notes (Addendum)
[place] DEPT Provider Note   CSN: [id number] Arrival date & time: 08/30/18  1231    History   Chief Complaint Chief Complaint  Patient presents with  . Back Pain    HPI Edward Todd is a 43 y.o. male.     HPI Patient presents to the emergency department with lower back discomfort that increases with certain movements and walking.  The patient states that when he tries to stand he feels like at times that his back will give out.  He will fall down to the ground.  The patient states that this started about a month ago.  He states that he was treated for pneumonia over a month ago and he feels like that is when the symptoms started.  Patient states he has no other symptoms from the pneumonia at this time.  He states that he has no trouble walking but he states at times his back will give out.  The patient denies chest pain, shortness of breath, headache,blurred vision, neck pain, fever, cough, weakness, numbness, dizziness, anorexia, edema, abdominal pain, nausea, vomiting, diarrhea, rash, dysuria, hematemesis, bloody stool, near syncope, or syncope. History reviewed. No pertinent past medical history.  There are no active problems to display for this patient.   History reviewed. No pertinent surgical history.      Home Medications    Prior to Admission medications   Medication Sig Start Date End Date Taking? Authorizing Provider  albuterol (PROVENTIL HFA;VENTOLIN HFA) 108 (90 Base) MCG/ACT inhaler Inhale 2 puffs into the lungs every 4 (four) hours as needed for wheezing or shortness of breath (cough, shortness of breath or wheezing.). 02/25/16   Ivar Drape D, PA  azithromycin (ZITHROMAX) 250 MG tablet Take 2 tabs PO x 1 dose, then 1 tab PO QD x 4 days Patient not taking: Reported on 03/21/2016 02/25/16   Ivar Drape D, PA  benzonatate (TESSALON) 100 MG capsule Take 1-2 capsules (100-200 mg total) by mouth 3 (three) times daily  as needed for cough. 03/21/16   Jaynee Eagles, PA-C  Guaifenesin [facility] MAXIMUM STRENGTH) 1200 MG TB12 Take 1 tablet (1,200 mg total) by mouth every 12 (twelve) hours as needed. Patient not taking: Reported on 03/21/2016 02/25/16   Joretta Bachelor, PA    Family History Family History  Problem Relation Age of Onset  . Diabetes Maternal Grandmother   . Diabetes Paternal Grandfather     Social History Social History   Tobacco Use  . Smoking status: Never Smoker  . Smokeless tobacco: Never Used  Substance Use Topics  . Alcohol use: No    Alcohol/week: 0.0 standard drinks  . Drug use: No     Allergies   Shellfish allergy   Review of Systems Review of Systems All other systems negative except as documented in the HPI. All pertinent positives and negatives as reviewed in the HPI.  Physical Exam Updated Vital Signs BP (!) 146/103   Pulse 95   Temp 98.2 F (36.8 C) (Oral)   Resp 16   SpO2 100%   Physical Exam Vitals signs and nursing note reviewed.  Constitutional:      General: He is not in acute distress.    Appearance: He is well-developed.  HENT:     Head: Normocephalic and atraumatic.  Eyes:     Pupils: Pupils are equal, round, and reactive to light.  Neck:     Musculoskeletal: Normal range of motion and neck supple.  Cardiovascular:  Rate and Rhythm: Normal rate and regular rhythm.     Heart sounds: Normal heart sounds. No murmur. No friction rub. No gallop.   Pulmonary:     Effort: Pulmonary effort is normal. No respiratory distress.     Breath sounds: Normal breath sounds. No wheezing.  Abdominal:     General: Bowel sounds are normal. There is no distension.     Palpations: Abdomen is soft.     Tenderness: There is no abdominal tenderness.  Musculoskeletal:     Lumbar back: He exhibits no tenderness and no bony tenderness.  Skin:    General: Skin is warm and dry.     Capillary Refill: Capillary refill takes less than 2 seconds.     Findings:  No erythema or rash.  Neurological:     Mental Status: He is alert and oriented to person, place, and time.     GCS: GCS eye subscore is 4. GCS verbal subscore is 5. GCS motor subscore is 6.     Sensory: Sensation is intact.     Motor: Motor function is intact. No weakness or abnormal muscle tone.     Coordination: Coordination is intact. Coordination normal.     Comments: Patient does have difficulty raising his legs up off the bed.  Patient has pain with sitting up in bed.  Psychiatric:        Behavior: Behavior normal.      ED Treatments / Results  Labs (all labs ordered are listed, but only abnormal results are displayed) Labs Reviewed  CBC WITH DIFFERENTIAL/PLATELET - Abnormal; Notable for the following components:      Result Value   RBC 2.52 (*)    Hemoglobin 7.6 (*)    HCT 23.0 (*)    nRBC 0.4 (*)    All other components within normal limits  URINALYSIS, ROUTINE W REFLEX MICROSCOPIC - Abnormal; Notable for the following components:   Protein, ur 30 (*)    All other components within normal limits  BASIC METABOLIC PANEL    EKG None  Radiology No results found.  Procedures Procedures (including critical care time)  Medications Ordered in ED Medications  sodium chloride 0.9 % bolus 1,000 mL (1,000 mLs Intravenous New Bag/Given 08/30/18 1450)  LORazepam (ATIVAN) injection 1 mg (1 mg Intravenous Given 08/30/18 1342)  morphine 4 MG/ML injection 4 mg (4 mg Intravenous Given 08/30/18 1342)  gadobutrol (GADAVIST) 1 MMOL/ML injection 10 mL (10 mLs Intravenous Contrast Given 08/30/18 1426)     Initial Impression / Assessment and Plan / ED Course  I have reviewed the triage vital signs and the nursing notes.  Pertinent labs & imaging results that were available during my care of the patient were reviewed by me and considered in my medical decision making (see chart for details).  Clinical Course as of Aug 30 1456  Thu Aug 30, 2018  1612 Discussed with Hospitalist   [BM]     Clinical Course User Index [BM] Deliah Boston, PA-C       Patient will have MRI and laboratory testing to determine if there is any significant cause of this back pain.  Will need further treatment of this and follow-up.  There is concern about the patient's anemia and what this place and the course of his treatment.  The MRI results have not been resulted and will sign out to the oncoming PA-C.  Patient has been stable otherwise will reassess once the laboratory testing has resulted.  Final Clinical Impressions(s) /  ED Diagnoses   Final diagnoses:  None    ED Discharge Orders    None       Dalia Heading, PA-C 08/30/18 1513    Dalia Heading, PA-C 08/31/18 1459    Valarie Merino, MD 09/03/18 2233

## 2018-08-30 NOTE — ED Provider Notes (Signed)
Care handoff received from [facility] PA-C at shift change please see his note for more details of visit.  In short patient presenting for midline lower back pain x3 weeks.  Reports pain began after he began treatment for pneumonia.  Patient has attempted muscle relaxers and Mobic without relief of symptoms.  Additionally patient noted to be anemic with hemoglobin of 7.6 here in the ED.  Plan of care at time of handoff is to await MRI results follow-up on hemoglobin and recheck patient. Physical Exam  BP (!) 146/103   Pulse 95   Temp 98.2 F (36.8 C) (Oral)   Resp 16   SpO2 100%   Physical Exam Constitutional:      General: He is not in acute distress.    Appearance: Normal appearance. He is well-developed. He is not ill-appearing or diaphoretic.  HENT:     Head: Normocephalic and atraumatic.     Right Ear: External ear normal.     Left Ear: External ear normal.     Nose: Nose normal.  Eyes:     General: Vision grossly intact. Gaze aligned appropriately.     Pupils: Pupils are equal, round, and reactive to light.  Neck:     Musculoskeletal: Normal range of motion.     Trachea: Trachea and phonation normal. No tracheal deviation.  Cardiovascular:     Rate and Rhythm: Normal rate and regular rhythm.     Pulses: Normal pulses.          Dorsalis pedis pulses are 2+ on the right side and 2+ on the left side.       Posterior tibial pulses are 2+ on the right side and 2+ on the left side.     Heart sounds: Normal heart sounds.  Pulmonary:     Effort: Pulmonary effort is normal. No respiratory distress.  Abdominal:     General: There is no distension.     Palpations: Abdomen is soft.     Tenderness: There is no abdominal tenderness. There is no guarding or rebound.  Musculoskeletal: Normal range of motion.       Back:     Comments: Midline lumbar spinal tenderness approximately L3-L5 without signs of injury.  Feet:     Right foot:     Protective Sensation: 5 sites tested.  5 sites sensed.     Left foot:     Protective Sensation: 5 sites tested. 5 sites sensed.  Skin:    General: Skin is warm and dry.  Neurological:     Mental Status: He is alert.     GCS: GCS eye subscore is 4. GCS verbal subscore is 5. GCS motor subscore is 6.     Comments: Speech is clear and goal oriented, follows commands Major Cranial nerves without deficit, no facial droop   Psychiatric:        Behavior: Behavior normal.     ED Course/Procedures   Clinical Course as of Aug 30 1647  Thu Aug 30, 2018  1612 Discussed with Hospitalist   [BM]    Clinical Course User Index [BM] Deliah Boston, PA-C    .Critical Care Performed by: Deliah Boston, PA-C Authorized by: Deliah Boston, PA-C   Critical care provider statement:    Critical care time (minutes):  35   Critical care was necessary to treat or prevent imminent or life-threatening deterioration of the following conditions:  Circulatory failure (Symptomatic anemia, hemoglobin 7.6)   Critical care was time spent  personally by me on the following activities:  Ordering and performing treatments and interventions, ordering and review of laboratory studies, ordering and review of radiographic studies, pulse oximetry, re-evaluation of patient's condition, review of old charts, obtaining history from patient or surrogate, examination of patient, evaluation of patient's response to treatment, discussions with consultants and development of treatment plan with patient or surrogate   MDM  3:45 PM: Initial evaluation patient resting comfortably in bed no acute distress.  Endorsing severe midline lumbar spinal tenderness to palpation.  He is unable to hold his legs off of the bed for greater than 1 second on examination.  Patient endorses feeling lightheaded and dizzy with standing and attempts at ambulation x1 week and was not able to ambulate into the emergency department today.  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  Subarticular zone narrowing could affect both L5 nerve roots.  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    Chronic appearing deformities of L2 and L4, but no acute or subacute  bone marrow edema to suggest a recent injury.    Diffusely abnormal bone marrow signal, with subcentimeter foci of  postcontrast enhancement. This could be related to severe anemia, or  body habitus, but a marrow replacement process cannot completely be  excluded. Hematologic consultation may be warranted.   CBC with hemoglobin of 7.6, appears as new symptomatic anemia BMP unremarkable Urinalysis with 30 protein LFTs pending - Patient with symptomatic anemia, concerning MRI today he is unable to ambulate or hold legs off of bed here in the emergency department.  Patient will need admission for further work-up.  Consult called to neurosurgery, plan admit to hospitalist service.  Case discussed with Dr. Thurnell Garbe who agrees with plan. - Still awaiting call from neurosurgery.  Discussed case with hospitalist service who is seeing patient in ER.   - Patient has been admitted to hospital service for further evaluation.   Note: Portions of this report may have been transcribed using voice recognition software. Every effort was made to ensure accuracy; however, inadvertent computerized transcription errors may still be present.    Deliah Boston, PA-C 08/30/18 [name], Hector, DO 09/01/18 [phone]

## 2018-08-30 NOTE — H&P (Signed)
History and Physical    Edward Todd NIO:[id number] DOB: 1975/06/25 DOA: 08/30/2018  PCP: Patient, No Pcp Per Patient coming from: Home lives at home with family wife and children  Chief Complaint: Back pain  HPI: Edward Todd is a 43 y.o. male with no significant past medical history had an accident at work in March 2020 where the oxygen tank fell on his back at the lower lumbar upper sacral area he went to [employer] doctor and ordered muscle relaxant and pain pills but has not had any imaging done at that time.  Since March she has been out of work walking with a cane but today he was unable to get out of bed unable to walk even 1 step.  No urinary complaints no bowel complaints no nausea vomiting diarrhea fever chills abdominal pain chest pain shortness of breath cough headaches.  No complaints of hematemesis hematuria blood in the urine blood in stool.  He reports being treated for pneumonia over a month ago.  No complaints of pneumonia at this time or symptoms of pneumonia at this time.  No syncope but feels dizzy upon standing.    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 Subarticular zone narrowing could affect both L5 nerve roots. 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  Chronic appearing deformities of L2 and L4, but no acute or subacute bone marrow edema to suggest a recent injury.  Diffusely abnormal bone marrow signal, with subcentimeter foci of postcontrast enhancement. This could be related to severe anemia, or body habitus, but a marrow replacement process cannot completely be excluded. Hematologic consultation may be warranted.  Hemoglobin 7.6 from 14.8 in 2017.  He has never been told he has anemia.  Creatinine 1.21 sodium 132 potassium 4.0  white count 4.9 hematocrit 23 platelet count 206.    Review of Systems: As per HPI otherwise all other systems reviewed and are negative  Ambulatory Status: Prior to the accident in March he was walking without any walker  History reviewed. No pertinent past medical history.  History reviewed. No pertinent surgical history.  Social History   Socioeconomic History   Marital status: Single    Spouse name: Not on file   Number of children: Not on file   Years of education: Not on file   Highest education level: Not on file  Occupational History   Not on file  Social Needs   Financial resource strain: Not on file   Food insecurity:    Worry: Not on file    Inability: Not on file   Transportation needs:    Medical: Not on file    Non-medical: Not on file  Tobacco Use   Smoking status: Never Smoker   Smokeless tobacco: Never Used  Substance and Sexual Activity   Alcohol use: No    Alcohol/week: 0.0 standard drinks   Drug use: No   Sexual activity: Yes  Lifestyle   Physical activity:    Days per week: Not on file    Minutes per session: Not on file   Stress: Not on file  Relationships   Social connections:    Talks on phone: Not on file    Gets together: Not on file    Attends religious service: Not on file    Active member of club  or organization: Not on file    Attends meetings of clubs or organizations: Not on file    Relationship status: Not on file   Intimate partner violence:    Fear of current or ex partner: Not on file    Emotionally abused: Not on file    Physically abused: Not on file    Forced sexual activity: Not on file  Other Topics Concern   Not on file  Social History Narrative   Marital status:  Single; not dating.       Children:  1 child (2yo)      Lives: with son.      Employment: [employer] x 2 months. Moved from [place].      Tobacco:  None      Alcohol: none      Drugs: none      Exercise:  Three days  per week.       Seatbelt:  100%; some texting n driving.      Guns:  None      Sexual activity:  5; no STDs; females only.   Education: College          Allergies  Allergen Reactions   Shellfish Allergy Rash    Family History  Problem Relation Age of Onset   Diabetes Maternal Grandmother    Diabetes Paternal Grandfather     Prior to Admission medications   Medication Sig Start Date End Date Taking? Authorizing Provider  Ascorbic Acid (VITAMIN C PO) Take 1 tablet by mouth daily.   Yes [provider]  Cyanocobalamin (B-12 PO) Take 1 tablet by mouth daily.   Yes [provider]  meloxicam (MOBIC) 15 MG tablet Take 7.5-15 mg by mouth daily. 08/13/18  Yes [provider]  methocarbamol (ROBAXIN) 500 MG tablet Take 500-1,000 mg by mouth at bedtime as needed for muscle pain. 08/13/18  Yes [provider]  POTASSIUM PO Take 1 tablet by mouth daily.   Yes [provider]  valACYclovir (VALTREX) 1000 MG tablet Take 1,000 mg by mouth daily as needed (cold sores).  08/27/18  Yes [provider]  albuterol (PROVENTIL HFA;VENTOLIN HFA) 108 (90 Base) MCG/ACT inhaler Inhale 2 puffs into the lungs every 4 (four) hours as needed for wheezing or shortness of breath (cough, shortness of breath or wheezing.). Patient not taking: Reported on 08/30/2018 02/25/16   Ivar Drape D, [place]    Physical Exam: Vitals:   08/30/18 1300 08/30/18 1315 08/30/18 1450  BP: (!) 154/103 139/88 (!) 146/103  Pulse: 79 84 95  Resp: 19 18 16   Temp: 98.2 F (36.8 C)    TempSrc: Oral    SpO2: 97% 99% 100%      General:PERRL, EOMI, normal lids, iris  ENT:  grossly normal hearing, lips & tongue, mmm  Neck:  no LAD, masses or thyromegaly  Cardiovascular:  RRR, no m/r/g. No LE edema.   Respiratory: CTA bilaterally, no w/r/r. Normal respiratory effort.  Abdomen:  soft, ntnd, NABS  Skin:  no rash or induration seen on limited exam  Musculoskeletal: Moves  all extremities but with decreased range of motion in the lower extremity unable to walk due to pain lower lumbar and upper sacral spinal tenderness   psychiatric:  grossly normal mood and affect, speech fluent and appropriate, AOx3  Neurologic:  CN 2-12 grossly intact, moves all extremities in coordinated fashion, sensation intact  Labs on Admission: I have personally reviewed following labs and imaging studies  CBC: Recent Labs  Lab 08/30/18 1337  WBC 4.9  NEUTROABS 2.2  HGB 7.6*  HCT 23.0*  MCV 91.3  PLT 709   Basic Metabolic Panel: Recent Labs  Lab 08/30/18 1337  NA 132*  K 4.0  CL 101  CO2 23  GLUCOSE 114*  BUN 20  CREATININE 1.21  CALCIUM 9.7   GFR: CrCl cannot be calculated (Unknown ideal weight.). Liver Function Tests: Recent Labs  Lab 08/30/18 1337  AST 20  ALT 13  ALKPHOS 50  BILITOT 0.7  PROT PENDING  ALBUMIN 2.9*   No results for input(s): LIPASE, AMYLASE in the last 168 hours. No results for input(s): AMMONIA in the last 168 hours. Coagulation Profile: No results for input(s): INR, PROTIME in the last 168 hours. Cardiac Enzymes: No results for input(s): CKTOTAL, CKMB, CKMBINDEX, TROPONINI in the last 168 hours. BNP (last 3 results) No results for input(s): PROBNP in the last 8760 hours. HbA1C: No results for input(s): HGBA1C in the last 72 hours. CBG: No results for input(s): GLUCAP in the last 168 hours. Lipid Profile: No results for input(s): CHOL, HDL, LDLCALC, TRIG, CHOLHDL, LDLDIRECT in the last 72 hours. Thyroid Function Tests: No results for input(s): TSH, T4TOTAL, FREET4, T3FREE, THYROIDAB in the last 72 hours. Anemia Panel: No results for input(s): VITAMINB12, FOLATE, FERRITIN, TIBC, IRON, RETICCTPCT in the last 72 hours. Urine analysis:    Component Value Date/Time   COLORURINE YELLOW 08/30/2018 1337   APPEARANCEUR CLEAR 08/30/2018 1337   LABSPEC 1.019 08/30/2018 1337   PHURINE 5.0 08/30/2018 1337   GLUCOSEU NEGATIVE  08/30/2018 1337   HGBUR NEGATIVE 08/30/2018 1337   BILIRUBINUR NEGATIVE 08/30/2018 1337   BILIRUBINUR neg 02/14/2013 2017   KETONESUR NEGATIVE 08/30/2018 1337   PROTEINUR 30 (A) 08/30/2018 1337   UROBILINOGEN 0.2 02/14/2013 2017   NITRITE NEGATIVE 08/30/2018 1337   LEUKOCYTESUR NEGATIVE 08/30/2018 1337    Creatinine Clearance: CrCl cannot be calculated (Unknown ideal weight.).  Sepsis Labs: @LABRCNTIP (procalcitonin:4,lacticidven:4) )No results found for this or any previous visit (from the past 240 hour(s)).   Radiological Exams on Admission: Mr Lumbar Spine W Wo Contrast (assess For Abscess, Cord Compression)  Result Date: 08/30/2018 CLINICAL DATA:  Heavy object fell patient on March 5th. Patient states now unable to move legs. Severe back pain. EXAM: MRI LUMBAR SPINE WITHOUT AND WITH CONTRAST TECHNIQUE: Multiplanar and multiecho pulse sequences of the lumbar spine were obtained without and with intravenous contrast. CONTRAST:  Gadavist 10 mL. COMPARISON:  No plain films or other cross-sectional imaging are available. FINDINGS: Segmentation:  Standard. Alignment: Straightening of the normal lumbar lordosis. No subluxation. Vertebrae: Chronic deformities of L2 and L4 with endplate softening, but no acute fracture or bone marrow edema. Diffuse low signal intensity bone marrow T1 and T2 weighted images, nonspecific, potentially related to anemia or body habitus, but in the setting of endplate softening is somewhat more concerning. No retropulsion. 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 Postcontrast, diffuse punctate abnormal enhancement of the spinous processes, greater than vertebral bodies, uncertain significance. Conus medullaris and cauda equina: Conus extends to the L1 level. Conus and cauda equina appear normal. Paraspinal and other soft tissues: Renal cystic disease, non worrisome. Disc levels: T12-L1: Normal. L1-L2:  Normal disc space.  Mild facet arthropathy.   No impingement. L2-L3:  Normal disc space.  Mild facet arthropathy.  No impingement. L3-L4: Mild congenital stenosis, posterior element hypertrophy. Normal disc space. No impingement. 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  Short pedicles. Annular bulge. Annular rent extends to the LEFT extraforaminal compartment, and is mildly vascularized indicating chronicity. BILATERAL subarticular zone narrowing affects both L5 nerve roots. BILATERAL foraminal narrowing is not clearly compressive. L5-S1: Mild congenital stenosis. Mild facet arthropathy. No impingement. 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 Subarticular zone narrowing could affect both L5 nerve roots. 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 Chronic appearing deformities of L2 and L4, but no acute or subacute bone marrow edema to suggest a recent injury. Diffusely abnormal bone marrow signal, with subcentimeter foci of postcontrast enhancement. This could be related to severe anemia, or body habitus, but a marrow replacement process cannot completely be excluded. Hematologic consultation may be warranted. Electronically Signed   By: Staci Righter M.D.   On: 08/30/2018 15:22    Assessment/Plan Active Problems:   * No active hospital problems. *    #1  Acute anemia of unclear etiology at this time-the last hemoglobin I have is from 2017 which is normal.  Patient denies any hematemesis hematochezia hematuria.  I will check FOBT anemia panel DIC panel and SPEP and UPEP.  Dr. Burr Medico will see him from oncology.  MRI of the lumbar spine concerning for abnormal marrow signal question marrow replacement process.  I will hold off on blood transfusion at this time since he is asymptomatic from anemia standpoint.  #2 acute lower back pain since oxygen tank  fell on his back in March 2020.  He is very tender on the lower lumbar upper sacral spine.  I will order muscle relaxants and pain control with Dilaudid.  #3 hypertension secondary to pain no history of hypertension I will start him on hydralazine as needed.   Estimated body mass index is 35.55 kg/m as calculated from the following:   Height as of 03/21/16: 6' 3.5" (1.918 m).   Weight as of 03/21/16: 130.7 kg.   DVT prophylaxis: I have ordered SCD due to acute anemia and abnormal MRI Code Status: Full code  family Communication: Discussed with patient Disposition Plan: Pending clinical improvement Consults called: Hematology Dr. Annamaria Boots Admission status: Inpatient   Georgette Shell MD [facility]  If 7PM-7AM, please contact night-coverage [email or web address] Password [facility]  08/30/2018, 4:17 PM

## 2018-08-30 NOTE — ED Notes (Signed)
ED TO INPATIENT HANDOFF REPORT  ED Nurse Name and Phone #: [phone]  S Name/Age/Gender Edward Todd 43 y.o. male Room/Bed: WA15/WA15  Code Status   Code Status: Full Code  Home/SNF/Other Home Patient oriented to: self, place, time and situation Is this baseline? Yes   Triage Complete: Triage complete  Chief Complaint back pain, pt falling  Triage Note Patient c/o lower back pain and spasms after recent dx of pneumonia. Reports pain worsens with movement and started after coughing and falls after taking promethazine. Seen and prescribed muscle relaxer and anti inflammatory with some relief. States completed z-pack and denies sx of pneumonia. Denies changes in bowel and bladder.   Allergies Allergies  Allergen Reactions  . Shellfish Allergy Rash    Level of Care/Admitting Diagnosis ED Disposition    ED Disposition Condition Comment   Admit  Hospital Area: [facility] [[id number]]  Level of Care: Med-Surg [16]  Covid Evaluation: N/A  Diagnosis: Symptomatic anemia [[id number]]  Admitting Physician: Georgette Shell [[id number]]  Attending Physician: Georgette Shell [[id number]]  Estimated length of stay: 3 - 4 days  Certification:: I certify this patient will need inpatient services for at least 2 midnights  PT Class (Do Not Modify): Inpatient [101]  PT Acc Code (Do Not Modify): Private [1]       B Medical/Surgery History History reviewed. No pertinent past medical history. History reviewed. No pertinent surgical history.   A IV Location/Drains/Wounds Patient Lines/Drains/Airways Status   Active Line/Drains/Airways    Name:   Placement date:   Placement time:   Site:   Days:   Peripheral IV 08/30/18 Left Antecubital   08/30/18    1336    Antecubital   less than 1          Intake/Output Last 24 hours  Intake/Output Summary (Last 24 hours) at 08/30/2018 1706 Last data filed at 08/30/2018 1554 Gross per 24 hour  Intake 1000 ml  Output -   Net 1000 ml    Labs/Imaging Results for orders placed or performed during the hospital encounter of 08/30/18 (from the past 48 hour(s))  Basic metabolic panel     Status: Abnormal   Collection Time: 08/30/18  1:37 PM  Result Value Ref Range   Sodium 132 (L) 135 - 145 mmol/L   Potassium 4.0 3.5 - 5.1 mmol/L   Chloride 101 98 - 111 mmol/L   CO2 23 22 - 32 mmol/L   Glucose, Bld 114 (H) 70 - 99 mg/dL   BUN 20 6 - 20 mg/dL   Creatinine, Ser 1.21 0.61 - 1.24 mg/dL   Calcium 9.7 8.9 - 10.3 mg/dL   GFR calc non Af Amer >60 >60 mL/min   GFR calc Af Amer >60 >60 mL/min   Anion gap 8 5 - 15    Comment: Performed at [facility], [place] [address] [place], [place] [address]  CBC with Differential     Status: Abnormal   Collection Time: 08/30/18  1:37 PM  Result Value Ref Range   WBC 4.9 4.0 - 10.5 K/uL   RBC 2.52 (L) 4.22 - 5.81 MIL/uL   Hemoglobin 7.6 (L) 13.0 - 17.0 g/dL   HCT 23.0 (L) 39.0 - 52.0 %   MCV 91.3 80.0 - 100.0 fL   MCH 30.2 26.0 - 34.0 pg   MCHC 33.0 30.0 - 36.0 g/dL   RDW 13.9 11.5 - 15.5 %   Platelets 206 150 - 400 K/uL   nRBC 0.4 (  H) 0.0 - 0.2 %   Neutrophils Relative % 45 %   Neutro Abs 2.2 1.7 - 7.7 K/uL   Lymphocytes Relative 46 %   Lymphs Abs 2.2 0.7 - 4.0 K/uL   Monocytes Relative 9 %   Monocytes Absolute 0.4 0.1 - 1.0 K/uL   Eosinophils Relative 0 %   Eosinophils Absolute 0.0 0.0 - 0.5 K/uL   Basophils Relative 0 %   Basophils Absolute 0.0 0.0 - 0.1 K/uL   Immature Granulocytes 0 %   Abs Immature Granulocytes 0.02 0.00 - 0.07 K/uL    Comment: Performed at [facility], [place] [address] [place], [place] [address]  Urinalysis, Routine w reflex microscopic     Status: Abnormal   Collection Time: 08/30/18  1:37 PM  Result Value Ref Range   Color, Urine YELLOW YELLOW   APPearance CLEAR CLEAR   Specific Gravity, Urine 1.019 1.005 - 1.030   pH 5.0 5.0 - 8.0   Glucose, UA NEGATIVE NEGATIVE mg/dL   Hgb urine dipstick  NEGATIVE NEGATIVE   Bilirubin Urine NEGATIVE NEGATIVE   Ketones, ur NEGATIVE NEGATIVE mg/dL   Protein, ur 30 (A) NEGATIVE mg/dL   Nitrite NEGATIVE NEGATIVE   Leukocytes,Ua NEGATIVE NEGATIVE   RBC / HPF 0-5 0 - 5 RBC/hpf   WBC, UA 0-5 0 - 5 WBC/hpf   Bacteria, UA NONE SEEN NONE SEEN   Squamous Epithelial / LPF 0-5 0 - 5   Mucus PRESENT     Comment: Performed at [facility], [place] [address], [place], [place] [address]  Hepatic function panel     Status: Abnormal   Collection Time: 08/30/18  1:37 PM  Result Value Ref Range   Total Protein >12.0 (H) 6.5 - 8.1 g/dL    Comment: REPEATED TO VERIFY   Albumin 2.9 (L) 3.5 - 5.0 g/dL   AST 20 15 - 41 U/L   ALT 13 0 - 44 U/L   Alkaline Phosphatase 50 38 - 126 U/L   Total Bilirubin 0.7 0.3 - 1.2 mg/dL   Bilirubin, Direct 0.1 0.0 - 0.2 mg/dL   Indirect Bilirubin 0.6 0.3 - 0.9 mg/dL    Comment: Performed at [facility], [place] [address], [place], [place] [address]  Type and screen [place]     Status: None   Collection Time: 08/30/18  3:54 PM  Result Value Ref Range   ABO/RH(D) O POS    Antibody Screen NEG    Sample Expiration      09/02/2018,2359 Performed at [facility], [place] [address], [place], [place] [address]    Mr Lumbar Spine W Wo Contrast (assess For Abscess, Cord Compression)  Result Date: 08/30/2018 CLINICAL DATA:  Heavy object fell patient on March 5th. Patient states now unable to move legs. Severe back pain. EXAM: MRI LUMBAR SPINE WITHOUT AND WITH CONTRAST TECHNIQUE: Multiplanar and multiecho pulse sequences of the lumbar spine were obtained without and with intravenous contrast. CONTRAST:  Gadavist 10 mL. COMPARISON:  No plain films or other cross-sectional imaging are available. FINDINGS: Segmentation:  Standard. Alignment: Straightening of the normal lumbar lordosis. No subluxation. Vertebrae: Chronic deformities of L2 and L4 with endplate softening,  but no acute fracture or bone marrow edema. Diffuse low signal intensity bone marrow T1 and T2 weighted images, nonspecific, potentially related to anemia or body habitus, but in the setting of endplate softening is somewhat more concerning. No retropulsion. Congenital stenosis, with short pedicles notably  at L3, L4, and L5. Postcontrast, diffuse punctate abnormal enhancement of the spinous processes, greater than vertebral bodies, uncertain significance. Conus medullaris and cauda equina: Conus extends to the L1 level. Conus and cauda equina appear normal. Paraspinal and other soft tissues: Renal cystic disease, non worrisome. Disc levels: T12-L1: Normal. L1-L2:  Normal disc space.  Mild facet arthropathy.  No impingement. L2-L3:  Normal disc space.  Mild facet arthropathy.  No impingement. L3-L4: Mild congenital stenosis, posterior element hypertrophy. Normal disc space. No impingement. 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 Short pedicles. Annular bulge. Annular rent extends to the LEFT extraforaminal compartment, and is mildly vascularized indicating chronicity. BILATERAL subarticular zone narrowing affects both L5 nerve roots. BILATERAL foraminal narrowing is not clearly compressive. L5-S1: Mild congenital stenosis. Mild facet arthropathy. No impingement. 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 Subarticular zone narrowing could affect both L5 nerve roots. 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 Chronic appearing deformities of L2 and L4, but no acute or subacute bone marrow edema to suggest a recent injury. Diffusely abnormal bone marrow signal, with subcentimeter foci of postcontrast enhancement. This could be related to severe anemia, or body habitus, but a marrow replacement process cannot  completely be excluded. Hematologic consultation may be warranted. Electronically Signed   By: Staci Righter M.D.   On: 08/30/2018 15:22    Pending Labs Unresulted Labs (From admission, onward)    Start     Ordered   08/31/18 0500  CBC  Tomorrow morning,   R     08/30/18 1628   08/31/18 0500  Comprehensive metabolic panel  Tomorrow morning,   R     08/30/18 1628   08/31/18 0500  Protime-INR  Tomorrow morning,   R     08/30/18 1628   08/31/18 0500  APTT  Tomorrow morning,   R     08/30/18 1628   08/30/18 1643  Protein electrophoresis, serum  Once,   R     08/30/18 1642   08/30/18 1631  Vitamin B12  (Anemia Panel (PNL))  Once,   R     08/30/18 1630   08/30/18 1631  Folate  (Anemia Panel (PNL))  Once,   R     08/30/18 1630   08/30/18 1631  Iron and TIBC  (Anemia Panel (PNL))  Once,   R     08/30/18 1630   08/30/18 1631  Ferritin  (Anemia Panel (PNL))  Once,   R     08/30/18 1630   08/30/18 1631  Reticulocytes  (Anemia Panel (PNL))  Once,   R     08/30/18 1630   08/30/18 1631  DIC (disseminated intravasc coag) panel  Once,   R     08/30/18 1630   08/30/18 1628  HIV antibody (Routine Testing)  Once,   R     08/30/18 1628   08/30/18 1614  SARS Coronavirus 2 (CEPHEID - Performed in [place] hospital lab), [place]  (Asymptomatic Patients Labs)  Once,   R    Question:  Rule Out  Answer:  Yes   08/30/18 1614   08/30/18 1554  ABO/Rh  Once,   R     08/30/18 1554   Unscheduled  Occult blood card to lab, stool  As needed,   R     08/30/18 1630  Vitals/Pain Today's Vitals   08/30/18 1450 08/30/18 1454 08/30/18 1630 08/30/18 1641  BP: (!) 146/103  (!) 165/106   Pulse: 95  87   Resp: 16  16   Temp:      TempSrc:      SpO2: 100%  100%   PainSc:  Asleep  4     Isolation Precautions No active isolations  Medications Medications  cyclobenzaprine (FLEXERIL) tablet 5 mg (has no administration in time range)  HYDROmorphone (DILAUDID) injection 0.5 mg (has no  administration in time range)  0.45 % sodium chloride infusion (has no administration in time range)  hydrALAZINE (APRESOLINE) injection 5 mg (has no administration in time range)  sodium chloride 0.9 % bolus 1,000 mL (0 mLs Intravenous Stopped 08/30/18 1554)  LORazepam (ATIVAN) injection 1 mg (1 mg Intravenous Given 08/30/18 1342)  morphine 4 MG/ML injection 4 mg (4 mg Intravenous Given 08/30/18 1342)  gadobutrol (GADAVIST) 1 MMOL/ML injection 10 mL (10 mLs Intravenous Contrast Given 08/30/18 1426)  morphine 4 MG/ML injection 4 mg (4 mg Intravenous Given 08/30/18 1603)    Mobility walks with device Moderate fall risk   Focused Assessments Musculoskeletal.    R Recommendations: See Admitting Provider Note  Report given to: Rozell Searing  Additional Notes:

## 2018-08-30 NOTE — ED Triage Notes (Addendum)
Patient c/o lower back pain and spasms after recent dx of pneumonia. Reports pain worsens with movement and started after coughing and falls after taking promethazine. Seen and prescribed muscle relaxer and anti inflammatory with some relief. States completed z-pack and denies sx of pneumonia. Denies changes in bowel and bladder.

## 2018-08-30 NOTE — ED Notes (Signed)
Pt reports being claustrophobic, unable to lay flat for MRI d/t back pain.  PA Gerald Stabs made aware.

## 2018-08-30 NOTE — ED Notes (Signed)
Pt transported to MRI 
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
## 2018-08-31 ENCOUNTER — Encounter (HOSPITAL_COMMUNITY): Payer: Self-pay | Admitting: *Deleted

## 2018-08-31 ENCOUNTER — Other Ambulatory Visit: Payer: Self-pay

## 2018-08-31 ENCOUNTER — Inpatient Hospital Stay (HOSPITAL_COMMUNITY): Payer: 59

## 2018-08-31 DIAGNOSIS — I10 Essential (primary) hypertension: Secondary | ICD-10-CM

## 2018-08-31 DIAGNOSIS — E8809 Other disorders of plasma-protein metabolism, not elsewhere classified: Secondary | ICD-10-CM

## 2018-08-31 DIAGNOSIS — E871 Hypo-osmolality and hyponatremia: Secondary | ICD-10-CM

## 2018-08-31 DIAGNOSIS — S22000A Wedge compression fracture of unspecified thoracic vertebra, initial encounter for closed fracture: Secondary | ICD-10-CM

## 2018-08-31 DIAGNOSIS — C9000 Multiple myeloma not having achieved remission: Secondary | ICD-10-CM | POA: Diagnosis present

## 2018-08-31 LAB — COMPREHENSIVE METABOLIC PANEL
ALT: 12 U/L (ref 0–44)
AST: 17 U/L (ref 15–41)
Albumin: 2.6 g/dL — ABNORMAL LOW (ref 3.5–5.0)
Alkaline Phosphatase: 51 U/L (ref 38–126)
Anion gap: 9 (ref 5–15)
BUN: 16 mg/dL (ref 6–20)
CO2: 22 mmol/L (ref 22–32)
Calcium: 9.6 mg/dL (ref 8.9–10.3)
Chloride: 101 mmol/L (ref 98–111)
Creatinine, Ser: 1.19 mg/dL (ref 0.61–1.24)
GFR calc Af Amer: >60 mL/min (ref >60)
GFR calc non Af Amer: >60 mL/min (ref >60)
Glucose, Bld: 112 mg/dL — ABNORMAL HIGH (ref 70–99)
Potassium: 3.5 mmol/L (ref 3.5–5.1)
Sodium: 132 mmol/L — ABNORMAL LOW (ref 135–145)
Total Bilirubin: 0.5 mg/dL (ref 0.3–1.2)
Total Protein: >12 g/dL — ABNORMAL HIGH (ref 6.5–8.1)

## 2018-08-31 LAB — CBC
HCT: 22.7 % — ABNORMAL LOW (ref 39.0–52.0)
Hemoglobin: 7.6 g/dL — ABNORMAL LOW (ref 13.0–17.0)
MCH: 30.3 pg (ref 26.0–34.0)
MCHC: 33.5 g/dL (ref 30.0–36.0)
MCV: 90.4 fL (ref 80.0–100.0)
Platelets: 185 10*3/uL (ref 150–400)
RBC: 2.51 MIL/uL — ABNORMAL LOW (ref 4.22–5.81)
RDW: 14 % (ref 11.5–15.5)
WBC: 4 10*3/uL (ref 4.0–10.5)
nRBC: 0.5 % — ABNORMAL HIGH (ref 0.0–0.2)

## 2018-08-31 LAB — IRON AND TIBC
Iron: 57 ug/dL (ref 45–182)
Saturation Ratios: 22 % (ref 17.9–39.5)
TIBC: 252 ug/dL (ref 250–450)
UIBC: 195 ug/dL

## 2018-08-31 LAB — PROTEIN ELECTROPHORESIS, SERUM
A/G Ratio: 0.5 — ABNORMAL LOW (ref 0.7–1.7)
Albumin ELP: 4 g/dL (ref 2.9–4.4)
Alpha-1-Globulin: 0.3 g/dL (ref 0.0–0.4)
Alpha-2-Globulin: 1.1 g/dL — ABNORMAL HIGH (ref 0.4–1.0)
Beta Globulin: 6.8 g/dL — ABNORMAL HIGH (ref 0.7–1.3)
Gamma Globulin: 0.1 g/dL — ABNORMAL LOW (ref 0.4–1.8)
Globulin, Total: 8.4 g/dL — ABNORMAL HIGH (ref 2.2–3.9)
M-Spike, %: 5.7 g/dL — ABNORMAL HIGH
Total Protein ELP: 12.4 g/dL — ABNORMAL HIGH (ref 6.0–8.5)

## 2018-08-31 LAB — ABO/RH: ABO/RH(D): O POS

## 2018-08-31 LAB — APTT: aPTT: 35 seconds (ref 24–36)

## 2018-08-31 LAB — PROTIME-INR
INR: 1.2 (ref 0.8–1.2)
Prothrombin Time: 15.3 seconds — ABNORMAL HIGH (ref 11.4–15.2)

## 2018-08-31 LAB — LACTATE DEHYDROGENASE: LDH: 245 U/L — ABNORMAL HIGH (ref 98–192)

## 2018-08-31 LAB — HIV ANTIBODY (ROUTINE TESTING W REFLEX): HIV Screen 4th Generation wRfx: NONREACTIVE

## 2018-08-31 MED ORDER — SODIUM CHLORIDE 0.9 % IV SOLN
INTRAVENOUS | Status: DC
  Administered 2018-08-31 – 2018-09-02 (×2): via INTRAVENOUS

## 2018-08-31 NOTE — Progress Notes (Addendum)
[place] Monday   DOB:[birth year]   YJ#:[id number]   YOV#:[id number]  Hem/Onc follow up note   Subjective: Pt declined bone marrow biopsy this morning. He talked to his wife and other family member again afterwards, and I spoke with his wife and mother on the phone around noon, they finally agreed with bone marrow biopsy. Pt is clinically stable, still has significant back pain, has not tried walking yet. No other new symptoms.    Objective:  Vitals:   08/31/18 1500 08/31/18 2052  BP: (!) 136/98 134/89  Pulse: 85 89  Resp: 18 16  Temp: 98.4 F (36.9 C) 98.3 F (36.8 C)  SpO2: 100% 100%    There is no height or weight on file to calculate BMI.  Intake/Output Summary (Last 24 hours) at 08/31/2018 2113 Last data filed at 08/31/2018 0515 Gross per 24 hour  Intake -  Output 950 ml  Net -950 ml     Sclerae unicteric  Oropharynx clear  No peripheral adenopathy  Lungs clear -- no rales or rhonchi  Heart regular rate and rhythm  Abdomen benign  Neuro nonfocal    CBG (last 3)  No results for input(s): GLUCAP in the last 72 hours.   Labs:  Urine Studies No results for input(s): UHGB, CRYS in the last 72 hours.  Invalid input(s): UACOL, UAPR, USPG, UPH, UTP, UGL, UKET, UBIL, UNIT, UROB, ULEU, UEPI, UWBC, URBC, UBAC, CAST, UCOM, BILUA  Basic Metabolic Panel: Recent Labs  Lab 08/30/18 1337 08/31/18 0551  NA 132* 132*  K 4.0 3.5  CL 101 101  CO2 23 22  GLUCOSE 114* 112*  BUN 20 16  CREATININE 1.21 1.19  CALCIUM 9.7 9.6   GFR CrCl cannot be calculated (Unknown ideal weight.). Liver Function Tests: Recent Labs  Lab 08/30/18 1337 08/31/18 0551  AST 20 17  ALT 13 12  ALKPHOS 50 51  BILITOT 0.7 0.5  PROT >12.0* >12.0*  ALBUMIN 2.9* 2.6*   No results for input(s): LIPASE, AMYLASE in the last 168 hours. No results for input(s): AMMONIA in the last 168 hours. Coagulation profile Recent Labs  Lab 08/30/18 1733 08/31/18 0551  INR 1.3* 1.2    CBC: Recent Labs   Lab 08/30/18 1337 08/30/18 1733 08/31/18 0551  WBC 4.9  --  4.0  NEUTROABS 2.2  --   --   HGB 7.6*  --  7.6*  HCT 23.0*  --  22.7*  MCV 91.3  --  90.4  PLT 206 215 185   Cardiac Enzymes: No results for input(s): CKTOTAL, CKMB, CKMBINDEX, TROPONINI in the last 168 hours. BNP: Invalid input(s): POCBNP CBG: No results for input(s): GLUCAP in the last 168 hours. D-Dimer Recent Labs    08/30/18 1733  DDIMER 0.88*   Hgb A1c No results for input(s): HGBA1C in the last 72 hours. Lipid Profile No results for input(s): CHOL, HDL, LDLCALC, TRIG, CHOLHDL, LDLDIRECT in the last 72 hours. Thyroid function studies No results for input(s): TSH, T4TOTAL, T3FREE, THYROIDAB in the last 72 hours.  Invalid input(s): FREET3 Anemia work up Recent Labs    08/30/18 1732 08/30/18 1733  VITAMINB12  --  250  FOLATE  --  9.9  FERRITIN  --  360*  TIBC  --  252  IRON  --  57  RETICCTPCT 1.3  --    Microbiology Recent Results (from the past 240 hour(s))  SARS Coronavirus 2 (CEPHEID - Performed in [place] hospital lab), Hosp Order     Status:  None   Collection Time: 08/30/18  4:41 PM  Result Value Ref Range Status   SARS Coronavirus 2 NEGATIVE NEGATIVE Final    Comment: (NOTE) If result is NEGATIVE SARS-CoV-2 target nucleic acids are NOT DETECTED. The SARS-CoV-2 RNA is generally detectable in upper and lower  respiratory specimens during the acute phase of infection. The lowest  concentration of SARS-CoV-2 viral copies this assay can detect is 250  copies / mL. A negative result does not preclude SARS-CoV-2 infection  and should not be used as the sole basis for treatment or other  patient management decisions.  A negative result may occur with  improper specimen collection / handling, submission of specimen other  than nasopharyngeal swab, presence of viral mutation(s) within the  areas targeted by this assay, and inadequate number of viral copies  (<250 copies / mL). A negative  result must be combined with clinical  observations, patient history, and epidemiological information. If result is POSITIVE SARS-CoV-2 target nucleic acids are DETECTED. The SARS-CoV-2 RNA is generally detectable in upper and lower  respiratory specimens dur ing the acute phase of infection.  Positive  results are indicative of active infection with SARS-CoV-2.  Clinical  correlation with patient history and other diagnostic information is  necessary to determine patient infection status.  Positive results do  not rule out bacterial infection or co-infection with other viruses. If result is PRESUMPTIVE POSTIVE SARS-CoV-2 nucleic acids MAY BE PRESENT.   A presumptive positive result was obtained on the submitted specimen  and confirmed on repeat testing.  While 2019 novel coronavirus  (SARS-CoV-2) nucleic acids may be present in the submitted sample  additional confirmatory testing may be necessary for epidemiological  and / or clinical management purposes  to differentiate between  SARS-CoV-2 and other Sarbecovirus currently known to infect humans.  If clinically indicated additional testing with an alternate test  methodology [phone]) is advised. The SARS-CoV-2 RNA is generally  detectable in upper and lower respiratory sp ecimens during the acute  phase of infection. The expected result is Negative. Fact Sheet for Patients:  StrictlyIdeas.no Fact Sheet for Healthcare Providers: BankingDealers.co.za This test is not yet approved or cleared by the [place] FDA and has been authorized for detection and/or diagnosis of SARS-CoV-2 by FDA under an Emergency Use Authorization (EUA).  This EUA will remain in effect (meaning this test can be used) for the duration of the COVID-19 declaration under Section 564(b)(1) of the Act, 21 U.S.C. section 360bbb-3(b)(1), unless the authorization is terminated or revoked sooner. Performed at [facility], [place] [address], [place], [place] [address]       Studies:  Mr Lumbar Spine W Wo Contrast (assess For Abscess, Cord Compression)  Result Date: 08/30/2018 CLINICAL DATA:  Heavy object fell patient on March 5th. Patient states now unable to move legs. Severe back pain. EXAM: MRI LUMBAR SPINE WITHOUT AND WITH CONTRAST TECHNIQUE: Multiplanar and multiecho pulse sequences of the lumbar spine were obtained without and with intravenous contrast. CONTRAST:  Gadavist 10 mL. COMPARISON:  No plain films or other cross-sectional imaging are available. FINDINGS: Segmentation:  Standard. Alignment: Straightening of the normal lumbar lordosis. No subluxation. Vertebrae: Chronic deformities of L2 and L4 with endplate softening, but no acute fracture or bone marrow edema. Diffuse low signal intensity bone marrow T1 and T2 weighted images, nonspecific, potentially related to anemia or body habitus, but in the setting of endplate softening is somewhat more concerning. No retropulsion. Congenital stenosis, with short pedicles  notably at L3, L4, and L5. Postcontrast, diffuse punctate abnormal enhancement of the spinous processes, greater than vertebral bodies, uncertain significance. Conus medullaris and cauda equina: Conus extends to the L1 level. Conus and cauda equina appear normal. Paraspinal and other soft tissues: Renal cystic disease, non worrisome. Disc levels: T12-L1: Normal. L1-L2:  Normal disc space.  Mild facet arthropathy.  No impingement. L2-L3:  Normal disc space.  Mild facet arthropathy.  No impingement. L3-L4: Mild congenital stenosis, posterior element hypertrophy. Normal disc space. No impingement. 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 Short pedicles. Annular bulge. Annular rent extends to the LEFT extraforaminal compartment, and is mildly vascularized indicating chronicity. BILATERAL subarticular zone narrowing affects both L5 nerve roots. BILATERAL foraminal  narrowing is not clearly compressive. L5-S1: Mild congenital stenosis. Mild facet arthropathy. No impingement. 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 Subarticular zone narrowing could affect both L5 nerve roots. 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 Chronic appearing deformities of L2 and L4, but no acute or subacute bone marrow edema to suggest a recent injury. Diffusely abnormal bone marrow signal, with subcentimeter foci of postcontrast enhancement. This could be related to severe anemia, or body habitus, but a marrow replacement process cannot completely be excluded. Hematologic consultation may be warranted. Electronically Signed   By: Staci Righter M.D.   On: 08/30/2018 15:22   Dg Bone Survey Met  Result Date: 08/31/2018 CLINICAL DATA:  Low back pain, history of multiple myeloma EXAM: METASTATIC BONE SURVEY COMPARISON:  None. FINDINGS: Small lucencies within the scapula bilaterally as can be seen with multiple myeloma. No other aggressive lytic or sclerotic osseous lesion. No periosteal reaction or bone destruction. No acute osseous abnormality. Chronic L1, L2 and L4 vertebral body compression fractures. Age-indeterminate T10, T9, T8, T6 vertebral body compression fractures. IMPRESSION: Small lucencies within the scapula bilaterally as can be seen with multiple myeloma. Age-indeterminate T6, T8, T9 and T10 vertebral body compression fractures. Electronically Signed   By: Kathreen Devoid   On: 08/31/2018 11:12    Assessment: 43 y.o. African-American male, without past medical history, presented with debilitating low back pain, and moderate anemia.  1. Normocytic hypo-productive anemia  2. Abnormal marrow signal on lumbar MRI  3. Hyperproteinemia  4. Severe low back pain, from thoracic and lumbar compression  fracture, lumbar spine stenosis   Plan:  -this is highly suspicious for multiple myeloma, I repeatedly discussed the importance of bone marrow biopsy for definitive diagnosis, and disease risk stratification, which is very important for prognosis and choosing treatment regimen. After lengthy discussion, both pt and his wife agreed with the procedure. He prefers to let my NP Mendel Ryder to do it, and I will schedule it for next Tuesday morning 8am.  -I reviewed the bone survey results, unfortunately it showed multiple thoracic and lumbar vertebral body compression fractures, which explains his sever back pain. Please consult IR to see if he is a candidate for vertebroplasty, to improve his pain and spinal stability, help him to regain his walking function  -pain management: he is on iv dilaudid, and flexeril, I recommend low dose oxycodone 22m every 8 hours  -MM lab result pending, H/H stable, no need for blood transfusion for now -I do not think he needs daily CBC, I recommend checking BMP every other day  -I will f/u on Monday, please  call me or my on-call partner for questions over the weekend   I spent a total of 60 mins on coordinating his care today, >50% time on face-to-face counseling.  Truitt Merle, MD 08/31/2018

## 2018-08-31 NOTE — Progress Notes (Signed)
Oncology note  I recommend a bone marrow biopsy for diagnosis and risk stratification, had long conversation about the biopsy procedure, the importance for diagnosis and treatment decision making, risks and logistics etc. Pt was reluctant to do the biopsy. He called his wife and decided to hold off the biopsy for now. I will call his wife later today.  Truitt Merle  08/31/2018

## 2018-08-31 NOTE — Progress Notes (Signed)
D-dimer 0.88 denies shortness of breath/ no noted edema or tenderness to lower extremities,  good pedal pulses.  On call aware of noted lab, no further orders noted

## 2018-08-31 NOTE — Progress Notes (Signed)
PROGRESS NOTE    Edward Todd  KKX:[id number] DOB: 1975/10/12 DOA: 08/30/2018 PCP: Patient, No Pcp Per   Brief Narrative:  HPI per Dr. Jacki Cones on 08/30/2018 Edward Todd is a 43 y.o. male with no significant past medical history had an accident at work in March 2020 where the oxygen tank fell on his back at the lower lumbar upper sacral area he went to [employer] doctor and ordered muscle relaxant and pain pills but has not had any imaging done at that time.  Since March she has been out of work walking with a cane but today he was unable to get out of bed unable to walk even 1 step.  No urinary complaints no bowel complaints no nausea vomiting diarrhea fever chills abdominal pain chest pain shortness of breath cough headaches.  No complaints of hematemesis hematuria blood in the urine blood in stool.  He reports being treated for pneumonia over a month ago.  No complaints of pneumonia at this time or symptoms of pneumonia at this time.  No syncope but feels dizzy upon standing.  **Interim History  Patient was still complaining of some back pain and soreness and states that it is more difficult to ambulate now.  I discussed with him about concern for multiple myeloma as he had just refused bone marrow biopsy.  Discussed with him the importance of this and he is willing to reconsider but is going on for bone scan.  Assessment & Plan:   Active Problems:   Symptomatic anemia   Acute Anemia of unclear etiology at this time but likely from suspected Multiple Myeloma -The Hgb in 2014 and 2017 was normal and 14.2 and 14.8 respectively  -Patient denies any hematemesis, hematochezia, hematuria.  -Anemia Panel done and showed iron level of 57, U IBC 195, TIBC of 252, saturation ratios of 22, ferritin level 360, folate level 9.9, vitamin B12 level 250 -DIC panel done showed a d-dimer of 0.88, fibrinogen level of 365, PT of 15.7, INR 1.3, APTT of 28 -No evidence for Hemolytic or  Nutritional Anemia per Oncology  -FOBT has been ordered and is pending to be done  -Checking SPEP and UPEP and will be checking 24-Hour Urine  -MRI of the lumbar spine concerning for abnormal marrow signal question marrow replacement process.  -Patient's Hb/Hct went from 7.6/23.0 -> 7.6/22.7 -Currentlyl holding off on blood transfusion at this time since he is asymptomatic from anemia standpoint. -Oncology Consulted for further evaluation and currently patient is being worked up -Continue to Monitor for S/Sx of Bleeding -Repeat CBC in AM    Suspected Multiple Myeloma -Total Protein is >12 and Albumin Continues to Be Low at 2.6 (Yesterday was 2.9) -Patient has Anemia and Severe Back Pain with Abnormal Marrow Signal on MRI -SPEP ordered and Dr. Burr Medico evaluated and ordering IFE, Light Chain Level, and Beta-2 Microglobulin and collect 24 Hour Urine for UPEP/IFE -LDH was 245 -Ordered a bone survey to be done today.  Patient was taken down for bone survey and this showed in the scapula bilaterally that can be seen with multiple myeloma.  Is also age indeterminate T6, T8, T9, and T10 vertebral body compression fractures -Dr. Burr Medico of Oncology recommending Bone Marrow Bx but patient used at this time and was reluctant and will need to discuss further with wife.  I recommended that the patient proceed with the bone marrow biopsy in order to get a confirmation diagnosis  Acute Lower Back Pain  -Since oxygen tank fell on  his back in March 2020.   -He is very tender on the lower lumbar upper sacral spine.   -Bone Survey Shows Multiple Thoracic Fractures of Indeterminate Age  -C/w Cyclobenzaprine 5 mg po TIDprn Muscle Spasms -Given IV Morphine 4 mg Once and will continue IV Hydromorphone 0.5 mg q3hprn Severe Pain  -PT/OT to evaluate and Treat   Hypertension  -Secondary to pain as patient has no Hx of HTN -Started on IV Hydralazine 5 mg IV q4hprn HBP -BP is now improved and is  135/62  Obesity -Estimated body mass index is 35.55 kg/m as calculated from the following:   Height as of 03/21/16: 6' 3.5" (1.918 m).   Weight as of 03/21/16: 130.7 kg. -Weight Loss and Dietary Counseling given   Hyponatremia  -Patient's Na+ was 132 -Given NS bolus of 1 Liter Yesterday -Continue to Monitor and Trend  -Will start Maintenance IVF with NS at 75 mL/hr as his half-normal saline at 100 mL's per hour is now stopped -Repeat CMP in AM   DVT prophylaxis: SCDs Code Status: FULL CODE Family Communication: No family present at bedside but Dr. Burr Medico is calling his wife Disposition Plan: Pending further work-up for multiple myeloma improvement of symptoms  Consultants:   Medical Oncology   Procedures: Bone Survey   Antimicrobials:  Anti-infectives (From admission, onward)   None     Subjective: Seen and examined bedside and was still complaining of some back pain he denies chest pain, lightheadedness or dizziness.  No nausea or vomiting but states his little bit more difficult to ambulate. No other concerns or complaints at this time and felt that his anemia was secondary to something familial as his family has a history of anemia.  Had refuses bone marrow biopsy and had several questions about that.  Objective: Vitals:   08/30/18 1732 08/30/18 1944 08/31/18 0513 08/31/18 0627  BP: (!) 154/103 (!) 142/97 (!) 133/98 135/62  Pulse: 90 100 83   Resp:  16 16   Temp: 98 F (36.7 C) 98 F (36.7 C) 98.3 F (36.8 C)   TempSrc:  Oral Oral   SpO2:  100% 100%     Intake/Output Summary (Last 24 hours) at 08/31/2018 1404 Last data filed at 08/31/2018 0515 Gross per 24 hour  Intake 1039.57 ml  Output 950 ml  Net 89.57 ml   There were no vitals filed for this visit.  Examination: Physical Exam:  Constitutional: WN/WD obese AAM in NAD and appears calm but somewhat uncomfortable  Eyes: Lids and conjunctivae normal, sclerae anicteric  ENMT: External Ears, Nose appear  normal. Grossly normal hearing. Mucous membranes are moist.  Neck: Appears normal, supple, no cervical masses, normal ROM, no appreciable thyromegaly; no JVD Respiratory: Diminished to auscultation bilaterally, no wheezing, rales, rhonchi or crackles. Normal respiratory effort and patient is not tachypenic. No accessory muscle use.  Cardiovascular: RRR, no murmurs / rubs / gallops. S1 and S2 auscultated. No extremity edema.  Abdomen: Soft, non-tender, Distended due to body habitus. No masses palpated. No appreciable hepatosplenomegaly. Bowel sounds positive x4.  GU: Deferred. Musculoskeletal: No clubbing / cyanosis of digits/nails. No joint deformity upper and lower extremities. Skin: No rashes, lesions, ulcers on a limited skin evaluation. No induration; Warm and dry.  Neurologic: CN 2-12 grossly intact with no focal deficits. Romberg sign and cerebellar reflexes not assessed.  Psychiatric: Normal judgment and insight. Alert and oriented x 3. Normal mood and appropriate affect.   Data Reviewed: I have personally reviewed following labs and  imaging studies  CBC: Recent Labs  Lab 08/30/18 1337 08/30/18 1733 08/31/18 0551  WBC 4.9  --  4.0  NEUTROABS 2.2  --   --   HGB 7.6*  --  7.6*  HCT 23.0*  --  22.7*  MCV 91.3  --  90.4  PLT 206 215 628   Basic Metabolic Panel: Recent Labs  Lab 08/30/18 1337 08/31/18 0551  NA 132* 132*  K 4.0 3.5  CL 101 101  CO2 23 22  GLUCOSE 114* 112*  BUN 20 16  CREATININE 1.21 1.19  CALCIUM 9.7 9.6   GFR: CrCl cannot be calculated (Unknown ideal weight.). Liver Function Tests: Recent Labs  Lab 08/30/18 1337 08/31/18 0551  AST 20 17  ALT 13 12  ALKPHOS 50 51  BILITOT 0.7 0.5  PROT >12.0* >12.0*  ALBUMIN 2.9* 2.6*   No results for input(s): LIPASE, AMYLASE in the last 168 hours. No results for input(s): AMMONIA in the last 168 hours. Coagulation Profile: Recent Labs  Lab 08/30/18 1733 08/31/18 0551  INR 1.3* 1.2   Cardiac  Enzymes: No results for input(s): CKTOTAL, CKMB, CKMBINDEX, TROPONINI in the last 168 hours. BNP (last 3 results) No results for input(s): PROBNP in the last 8760 hours. HbA1C: No results for input(s): HGBA1C in the last 72 hours. CBG: No results for input(s): GLUCAP in the last 168 hours. Lipid Profile: No results for input(s): CHOL, HDL, LDLCALC, TRIG, CHOLHDL, LDLDIRECT in the last 72 hours. Thyroid Function Tests: No results for input(s): TSH, T4TOTAL, FREET4, T3FREE, THYROIDAB in the last 72 hours. Anemia Panel: Recent Labs    08/30/18 1732 08/30/18 1733  VITAMINB12  --  250  FOLATE  --  9.9  FERRITIN  --  360*  TIBC  --  252  IRON  --  57  RETICCTPCT 1.3  --    Sepsis Labs: No results for input(s): PROCALCITON, LATICACIDVEN in the last 168 hours.  Recent Results (from the past 240 hour(s))  SARS Coronavirus 2 (CEPHEID - Performed in [place] hospital lab), Hosp Order     Status: None   Collection Time: 08/30/18  4:41 PM  Result Value Ref Range Status   SARS Coronavirus 2 NEGATIVE NEGATIVE Final    Comment: (NOTE) If result is NEGATIVE SARS-CoV-2 target nucleic acids are NOT DETECTED. The SARS-CoV-2 RNA is generally detectable in upper and lower  respiratory specimens during the acute phase of infection. The lowest  concentration of SARS-CoV-2 viral copies this assay can detect is 250  copies / mL. A negative result does not preclude SARS-CoV-2 infection  and should not be used as the sole basis for treatment or other  patient management decisions.  A negative result may occur with  improper specimen collection / handling, submission of specimen other  than nasopharyngeal swab, presence of viral mutation(s) within the  areas targeted by this assay, and inadequate number of viral copies  (<250 copies / mL). A negative result must be combined with clinical  observations, patient history, and epidemiological information. If result is POSITIVE SARS-CoV-2 target  nucleic acids are DETECTED. The SARS-CoV-2 RNA is generally detectable in upper and lower  respiratory specimens dur ing the acute phase of infection.  Positive  results are indicative of active infection with SARS-CoV-2.  Clinical  correlation with patient history and other diagnostic information is  necessary to determine patient infection status.  Positive results do  not rule out bacterial infection or co-infection with other viruses. If result is  PRESUMPTIVE POSTIVE SARS-CoV-2 nucleic acids MAY BE PRESENT.   A presumptive positive result was obtained on the submitted specimen  and confirmed on repeat testing.  While 2019 novel coronavirus  (SARS-CoV-2) nucleic acids may be present in the submitted sample  additional confirmatory testing may be necessary for epidemiological  and / or clinical management purposes  to differentiate between  SARS-CoV-2 and other Sarbecovirus currently known to infect humans.  If clinically indicated additional testing with an alternate test  methodology [id number]) is advised. The SARS-CoV-2 RNA is generally  detectable in upper and lower respiratory sp ecimens during the acute  phase of infection. The expected result is Negative. Fact Sheet for Patients:  StrictlyIdeas.no Fact Sheet for Healthcare Providers: BankingDealers.co.za This test is not yet approved or cleared by the [place] FDA and has been authorized for detection and/or diagnosis of SARS-CoV-2 by FDA under an Emergency Use Authorization (EUA).  This EUA will remain in effect (meaning this test can be used) for the duration of the COVID-19 declaration under Section 564(b)(1) of the Act, 21 U.S.C. section 360bbb-3(b)(1), unless the authorization is terminated or revoked sooner. Performed at [facility], [place] [address], [place], [place] [address]     Radiology Studies: Mr Lumbar Spine W Wo Contrast (assess For  Abscess, Cord Compression)  Result Date: 08/30/2018 CLINICAL DATA:  Heavy object fell patient on March 5th. Patient states now unable to move legs. Severe back pain. EXAM: MRI LUMBAR SPINE WITHOUT AND WITH CONTRAST TECHNIQUE: Multiplanar and multiecho pulse sequences of the lumbar spine were obtained without and with intravenous contrast. CONTRAST:  Gadavist 10 mL. COMPARISON:  No plain films or other cross-sectional imaging are available. FINDINGS: Segmentation:  Standard. Alignment: Straightening of the normal lumbar lordosis. No subluxation. Vertebrae: Chronic deformities of L2 and L4 with endplate softening, but no acute fracture or bone marrow edema. Diffuse low signal intensity bone marrow T1 and T2 weighted images, nonspecific, potentially related to anemia or body habitus, but in the setting of endplate softening is somewhat more concerning. No retropulsion. 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 Postcontrast, diffuse punctate abnormal enhancement of the spinous processes, greater than vertebral bodies, uncertain significance. Conus medullaris and cauda equina: Conus extends to the L1 level. Conus and cauda equina appear normal. Paraspinal and other soft tissues: Renal cystic disease, non worrisome. Disc levels: T12-L1: Normal. L1-L2:  Normal disc space.  Mild facet arthropathy.  No impingement. L2-L3:  Normal disc space.  Mild facet arthropathy.  No impingement. L3-L4: Mild congenital stenosis, posterior element hypertrophy. Normal disc space. No impingement. 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 Short pedicles. Annular bulge. Annular rent extends to the LEFT extraforaminal compartment, and is mildly vascularized indicating chronicity. BILATERAL subarticular zone narrowing affects both L5 nerve roots. BILATERAL foraminal narrowing is not clearly compressive. L5-S1: Mild congenital stenosis. Mild facet arthropathy. No impingement. 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 Subarticular zone narrowing could affect both L5 nerve roots. 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 Chronic appearing deformities of L2 and L4, but no acute or subacute bone marrow edema to suggest a recent injury. Diffusely abnormal bone marrow signal, with subcentimeter foci of postcontrast enhancement. This could be related to severe anemia, or body habitus, but a marrow replacement process cannot completely be excluded. Hematologic consultation  may be warranted. Electronically Signed   By: Staci Righter M.D.   On: 08/30/2018 15:22   Dg Bone Survey Met  Result Date: 08/31/2018 CLINICAL DATA:  Low back pain, history of multiple myeloma EXAM: METASTATIC BONE SURVEY COMPARISON:  None. FINDINGS: Small lucencies within the scapula bilaterally as can be seen with multiple myeloma. No other aggressive lytic or sclerotic osseous lesion. No periosteal reaction or bone destruction. No acute osseous abnormality. Chronic L1, L2 and L4 vertebral body compression fractures. Age-indeterminate T10, T9, T8, T6 vertebral body compression fractures. IMPRESSION: Small lucencies within the scapula bilaterally as can be seen with multiple myeloma. Age-indeterminate T6, T8, T9 and T10 vertebral body compression fractures. Electronically Signed   By: Kathreen Devoid   On: 08/31/2018 11:12    LOS: 1 day   Kerney Elbe, DO [facility] PAGER is on [place]  If 7PM-7AM, please contact night-coverage [email or web address] Password [email or web address] 08/31/2018, 2:04 PM

## 2018-09-01 ENCOUNTER — Inpatient Hospital Stay (HOSPITAL_COMMUNITY): Payer: 59

## 2018-09-01 LAB — BETA 2 MICROGLOBULIN, SERUM: Beta-2 Microglobulin: 5.5 mg/L — ABNORMAL HIGH (ref 0.6–2.4)

## 2018-09-01 LAB — COMPREHENSIVE METABOLIC PANEL
ALT: 9 U/L (ref 0–44)
AST: 14 U/L — ABNORMAL LOW (ref 15–41)
Albumin: 2.5 g/dL — ABNORMAL LOW (ref 3.5–5.0)
Alkaline Phosphatase: 49 U/L (ref 38–126)
Anion gap: 10 (ref 5–15)
BUN: 14 mg/dL (ref 6–20)
CO2: 24 mmol/L (ref 22–32)
Calcium: 9.4 mg/dL (ref 8.9–10.3)
Chloride: 97 mmol/L — ABNORMAL LOW (ref 98–111)
Creatinine, Ser: 1.17 mg/dL (ref 0.61–1.24)
GFR calc Af Amer: >60 mL/min (ref >60)
GFR calc non Af Amer: >60 mL/min (ref >60)
Glucose, Bld: 111 mg/dL — ABNORMAL HIGH (ref 70–99)
Potassium: 3.6 mmol/L (ref 3.5–5.1)
Sodium: 131 mmol/L — ABNORMAL LOW (ref 135–145)
Total Bilirubin: 0.3 mg/dL (ref 0.3–1.2)
Total Protein: 11.9 g/dL — ABNORMAL HIGH (ref 6.5–8.1)

## 2018-09-01 LAB — PHOSPHORUS: Phosphorus: 2.4 mg/dL — ABNORMAL LOW (ref 2.5–4.6)

## 2018-09-01 LAB — CBC WITH DIFFERENTIAL/PLATELET
Abs Immature Granulocytes: 0.01 10*3/uL (ref 0.00–0.07)
Basophils Absolute: 0 10*3/uL (ref 0.0–0.1)
Basophils Relative: 0 %
Eosinophils Absolute: 0 10*3/uL (ref 0.0–0.5)
Eosinophils Relative: 1 %
HCT: 23.1 % — ABNORMAL LOW (ref 39.0–52.0)
Hemoglobin: 7.6 g/dL — ABNORMAL LOW (ref 13.0–17.0)
Immature Granulocytes: 0 %
Lymphocytes Relative: 50 %
Lymphs Abs: 1.9 10*3/uL (ref 0.7–4.0)
MCH: 30.5 pg (ref 26.0–34.0)
MCHC: 32.9 g/dL (ref 30.0–36.0)
MCV: 92.8 fL (ref 80.0–100.0)
Monocytes Absolute: 0.4 10*3/uL (ref 0.1–1.0)
Monocytes Relative: 10 %
Neutro Abs: 1.5 10*3/uL — ABNORMAL LOW (ref 1.7–7.7)
Neutrophils Relative %: 39 %
Platelets: 208 10*3/uL (ref 150–400)
RBC: 2.49 MIL/uL — ABNORMAL LOW (ref 4.22–5.81)
RDW: 14.2 % (ref 11.5–15.5)
WBC: 3.8 10*3/uL — ABNORMAL LOW (ref 4.0–10.5)
nRBC: 0 % (ref 0.0–0.2)

## 2018-09-01 LAB — MAGNESIUM: Magnesium: 1.8 mg/dL (ref 1.7–2.4)

## 2018-09-01 MED ORDER — OXYCODONE HCL 5 MG PO TABS
5.0000 mg | ORAL_TABLET | Freq: Three times a day (TID) | ORAL | Status: DC | PRN
  Administered 2018-09-01 – 2018-09-07 (×6): 5 mg via ORAL
  Filled 2018-09-01 (×7): qty 1

## 2018-09-01 MED ORDER — K PHOS MONO-SOD PHOS DI & MONO 155-852-130 MG PO TABS
500.0000 mg | ORAL_TABLET | Freq: Two times a day (BID) | ORAL | Status: AC
  Administered 2018-09-01 (×2): 500 mg via ORAL
  Filled 2018-09-01 (×2): qty 2

## 2018-09-01 NOTE — Progress Notes (Signed)
PROGRESS NOTE    Edward Todd  LAG:[id number] DOB: September 24, 1975 DOA: 08/30/2018 PCP: Patient, No Pcp Per   Brief Narrative:  HPI per Dr. Jacki Cones on 08/30/2018 Edward Todd is a 43 y.o. male with no significant past medical history had an accident at work in March 2020 where the oxygen tank fell on his back at the lower lumbar upper sacral area he went to [employer] doctor and ordered muscle relaxant and pain pills but has not had any imaging done at that time.  Since March she has been out of work walking with a cane but today he was unable to get out of bed unable to walk even 1 step.  No urinary complaints no bowel complaints no nausea vomiting diarrhea fever chills abdominal pain chest pain shortness of breath cough headaches.  No complaints of hematemesis hematuria blood in the urine blood in stool.  He reports being treated for pneumonia over a month ago.  No complaints of pneumonia at this time or symptoms of pneumonia at this time.  No syncope but feels dizzy upon standing.  **Interim History  Patient was still complaining of some back pain and soreness and states that it is more difficult to ambulate now.  I discussed with him about concern for multiple myeloma as he had just refused bone marrow biopsy.  Discussed with him the importance of this and he is willing to reconsider but is going on for bone scan.  Assessment & Plan:   Active Problems:   Symptomatic anemia   Multiple myeloma (HCC)  Acute Anemia of unclear etiology at this time but likely from suspected Multiple Myeloma -The Hgb in 2014 and 2017 was normal and 14.2 and 14.8 respectively  -Patient denies any hematemesis, hematochezia, hematuria.  -Anemia Panel done and showed iron level of 57, U IBC 195, TIBC of 252, saturation ratios of 22, ferritin level 360, folate level 9.9, vitamin B12 level 250 -DIC panel done showed a d-dimer of 0.88, fibrinogen level of 365, PT of 15.7, INR 1.3, APTT of 28 -No  evidence for Hemolytic or Nutritional Anemia per Oncology  -FOBT has been ordered and is pending to be done  -Checking SPEP and UPEP and will be checking 24-Hour Urine -MRI of the lumbar spine concerning for abnormal marrow signal question marrow replacement process.  -Patient's Hb/Hct went from 7.6/23.0 -> 7.6/22.7 and repeat this AM is pending  -Currentlyl holding off on blood transfusion at this time since he is asymptomatic from anemia standpoint. -Oncology Consulted for further evaluation and currently patient is being worked up -Continue to Monitor for S/Sx of Bleeding -Repeat CBC in AM    Suspected Multiple Myeloma -Total Protein was >12 and Albumin was low at 2.6; Now Total Protein is 11.9 and Albumin is 2.5 -Patient has Anemia and Severe Back Pain with Abnormal Marrow Signal on MRI -SPEP ordered and Dr. Burr Medico evaluated and ordering IFE, Light Chain Level, and Beta-2 Microglobulin (5.5) and collect 24 Hour Urine for UPEP/IFE -LDH was 245 -Ordered a bone survey to be done this showed in the scapula bilaterally that can be seen with multiple myeloma. Also showed  age indeterminate T6, T8, T9, and T10 vertebral body compression fractures and Chronic L1, L2, and L4 Fractures -Patient's Total Protein ELP was 12.4, Total Globulin was 8.4, A/G Ratio was 0.5, Alpha-2-Globulin was 1.1, Beta Globulin was 6.8, and M-Spike was 5.7% -Dr. Burr Medico of Oncology recommending Bone Marrow Bx and this will be done on Tuesday 09/04/2018  Acute Lower Back Pain  -Since oxygen tank fell on his back in March 2020.   -He is very tender on the lower lumbar upper sacral spine.   -Bone Survey Shows Multiple Thoracic Fractures of Indeterminate Age and Chronic L1, L2, and L4 pain -IR to Evaluate and Treat and he has No Acute Lumbar Fractures so they will not intervene on those for KP/VP but they recommend MRI of Thoracic Spine to further investigate the Age Indeterminate Thoracic Fractures  -C/w Cyclobenzaprine 5 mg po  TIDprn Muscle Spasms -Given IV Morphine 4 mg Once and will continue IV Hydromorphone 0.5 mg q3hprn Severe Pain  -Added 5 mg of Oxycodone every 8 PRN for Moderate Pain -PT/OT to evaluate and Treat   Thoracic Compression Fractures -As Above  -Bone survey to be done this showed in the scapula bilaterally that can be seen with multiple myeloma. Also showed  age indeterminate T6, T8, T9, and T10 vertebral body compression fractures and Chronic L1, L2, and L4 Fractures -IR to Evaluate and Treat and he has No Acute Lumbar Fractures so they will not intervene on those for KP/VP but they recommend MRI of Thoracic Spine to further investigate the Age Indeterminate Thoracic Fractures   Hypertension  -Secondary to pain as patient has no Hx of HTN -Started on IV Hydralazine 5 mg IV q4hprn HBP -BP is now improved and is 137/89  Obesity -Estimated body mass index is 35.55 kg/m as calculated from the following:   Height as of 03/21/16: 6' 3.5" (1.918 m).   Weight as of 03/21/16: 130.7 kg. -Weight Loss and Dietary Counseling given   Hyponatremia  -Patient's Na+ was 131 -Given NS bolus of 1 Liter the day before yesterday  -Continue to Monitor and Trend  -Will start Maintenance IVF with NS at 75 mL/hr as his half-normal saline at 100 mL's per hour is now stopped and will continue today  -Repeat CMP in AM   Hyperphosphatemia -Patient's Phos Level was 2.4 -Replete with po K Phos Neutral 500 mg po BID x2 -Continue to Monitor and Replete as Necessary -Repeat CMP in AM   Hyperglycemia -Patient's Blood Sugars ranging from 111-114 on CMP's -Check HbA1c -Continue to Monitor Blood Sugars Carefully -If necessary will need to place on Sensitive Novolog SSI AC  DVT prophylaxis: SCDs Code Status: FULL CODE Family Communication: No family present at bedside but Dr. Burr Medico is calling his wife Disposition Plan: Pending further work-up for multiple myeloma improvement of symptoms  Consultants:   Medical  Oncology  Interventional Radiology    Procedures: Bone Survey  Bone Marrow Biopsy to be done on 09/04/2018   Antimicrobials:  Anti-infectives (From admission, onward)   None     Subjective: Seen and examined bedside and states his pain was controlled with the Dilaudid he received.  No nausea or vomiting.  Denies chest pain, lightheadedness or dizziness.  Now is agreeable to obtaining a bone marrow biopsy.  Had questions about possible vertebroplasty and I discussed with him that I will still come see him for possible intervention depending on the acuity of his fractures.  IR spoke with me and they recommending an MRI of the thoracic spine and this will be ordered.  Objective: Vitals:   08/31/18 0627 08/31/18 1500 08/31/18 2052 09/01/18 0604  BP: 135/62 (!) 136/98 134/89 125/87  Pulse:  85 89 85  Resp:  '18 16 16  ' Temp:  98.4 F (36.9 C) 98.3 F (36.8 C) 98.2 F (36.8 C)  TempSrc:  Oral  Oral Oral  SpO2:  100% 100% 96%    Intake/Output Summary (Last 24 hours) at 09/01/2018 1328 Last data filed at 09/01/2018 0900 Gross per 24 hour  Intake 1254.34 ml  Output 1150 ml  Net 104.34 ml   There were no vitals filed for this visit.  Examination: Physical Exam:  Constitutional: Well-nourished, well-developed obese African-American male currently no acute distress appears calm and a little bit more comfortable after he is received his Dilaudid Eyes: Lids and conjunctive are normal.  Sclera is anicteric ENMT: External ears and nose appear normal.  Grossly normal hearing.  Mucous members are moist Neck: Appears supple no JVD Respiratory: Mildly diminished auscultation bilaterally no appreciable wheezing, rales rhonchi.  Patient not tachypneic wheezing and accessory muscle breathe Cardiovascular: Regular rate and rhythm.  No appreciable murmurs, rubs, gallops.  No lower extremity edema noted Abdomen: Soft, nontender, distended secondary body habitus.  Bowel sounds present GU: Deferred  Musculoskeletal: No contractures or cyanosis.  No joint deformities in the upper and lower extremities Skin: No appreciable rashes or lesions on limited skin evaluation.  Skin is warm and dry Neurologic: Cranial nerves II through XII gross intact no appreciable focal deficits.  Romberg sign cerebellar reflexes were not assessed Psychiatric: And insight.  Patient is awake, alert, oriented x3.  Has a pleasant mood and affect  Data Reviewed: I have personally reviewed following labs and imaging studies  CBC: Recent Labs  Lab 08/30/18 1337 08/30/18 1733 08/31/18 0551  WBC 4.9  --  4.0  NEUTROABS 2.2  --   --   HGB 7.6*  --  7.6*  HCT 23.0*  --  22.7*  MCV 91.3  --  90.4  PLT 206 215 846   Basic Metabolic Panel: Recent Labs  Lab 08/30/18 1337 08/31/18 0551 09/01/18 0602  NA 132* 132* 131*  K 4.0 3.5 3.6  CL 101 101 97*  CO2 '23 22 24  ' GLUCOSE 114* 112* 111*  BUN '20 16 14  ' CREATININE 1.21 1.19 1.17  CALCIUM 9.7 9.6 9.4  MG  --   --  1.8  PHOS  --   --  2.4*   GFR: CrCl cannot be calculated (Unknown ideal weight.). Liver Function Tests: Recent Labs  Lab 08/30/18 1337 08/31/18 0551 09/01/18 0602  AST 20 17 14*  ALT '13 12 9  ' ALKPHOS 50 51 49  BILITOT 0.7 0.5 0.3  PROT >12.0* >12.0* 11.9*  ALBUMIN 2.9* 2.6* 2.5*   No results for input(s): LIPASE, AMYLASE in the last 168 hours. No results for input(s): AMMONIA in the last 168 hours. Coagulation Profile: Recent Labs  Lab 08/30/18 1733 08/31/18 0551  INR 1.3* 1.2   Cardiac Enzymes: No results for input(s): CKTOTAL, CKMB, CKMBINDEX, TROPONINI in the last 168 hours. BNP (last 3 results) No results for input(s): PROBNP in the last 8760 hours. HbA1C: No results for input(s): HGBA1C in the last 72 hours. CBG: No results for input(s): GLUCAP in the last 168 hours. Lipid Profile: No results for input(s): CHOL, HDL, LDLCALC, TRIG, CHOLHDL, LDLDIRECT in the last 72 hours. Thyroid Function Tests: No results for  input(s): TSH, T4TOTAL, FREET4, T3FREE, THYROIDAB in the last 72 hours. Anemia Panel: Recent Labs    08/30/18 1732 08/30/18 1733  VITAMINB12  --  250  FOLATE  --  9.9  FERRITIN  --  360*  TIBC  --  252  IRON  --  57  RETICCTPCT 1.3  --    Sepsis Labs: No results for  input(s): PROCALCITON, LATICACIDVEN in the last 168 hours.  Recent Results (from the past 240 hour(s))  SARS Coronavirus 2 (CEPHEID - Performed in [facility] hospital lab), Hosp Order     Status: None   Collection Time: 08/30/18  4:41 PM  Result Value Ref Range Status   SARS Coronavirus 2 NEGATIVE NEGATIVE Final    Comment: (NOTE) If result is NEGATIVE SARS-CoV-2 target nucleic acids are NOT DETECTED. The SARS-CoV-2 RNA is generally detectable in upper and lower  respiratory specimens during the acute phase of infection. The lowest  concentration of SARS-CoV-2 viral copies this assay can detect is 250  copies / mL. A negative result does not preclude SARS-CoV-2 infection  and should not be used as the sole basis for treatment or other  patient management decisions.  A negative result may occur with  improper specimen collection / handling, submission of specimen other  than nasopharyngeal swab, presence of viral mutation(s) within the  areas targeted by this assay, and inadequate number of viral copies  (<250 copies / mL). A negative result must be combined with clinical  observations, patient history, and epidemiological information. If result is POSITIVE SARS-CoV-2 target nucleic acids are DETECTED. The SARS-CoV-2 RNA is generally detectable in upper and lower  respiratory specimens dur ing the acute phase of infection.  Positive  results are indicative of active infection with SARS-CoV-2.  Clinical  correlation with patient history and other diagnostic information is  necessary to determine patient infection status.  Positive results do  not rule out bacterial infection or co-infection with other viruses.  If result is PRESUMPTIVE POSTIVE SARS-CoV-2 nucleic acids MAY BE PRESENT.   A presumptive positive result was obtained on the submitted specimen  and confirmed on repeat testing.  While 2019 novel coronavirus  (SARS-CoV-2) nucleic acids may be present in the submitted sample  additional confirmatory testing may be necessary for epidemiological  and / or clinical management purposes  to differentiate between  SARS-CoV-2 and other Sarbecovirus currently known to infect humans.  If clinically indicated additional testing with an alternate test  methodology [phone]) is advised. The SARS-CoV-2 RNA is generally  detectable in upper and lower respiratory sp ecimens during the acute  phase of infection. The expected result is Negative. Fact Sheet for Patients:  StrictlyIdeas.no Fact Sheet for Healthcare Providers: BankingDealers.co.za This test is not yet approved or cleared by the [place] FDA and has been authorized for detection and/or diagnosis of SARS-CoV-2 by FDA under an Emergency Use Authorization (EUA).  This EUA will remain in effect (meaning this test can be used) for the duration of the COVID-19 declaration under Section 564(b)(1) of the Act, 21 U.S.C. section 360bbb-3(b)(1), unless the authorization is terminated or revoked sooner. Performed at [facility], [place] [address] [place], [place] [address]     Radiology Studies: Mr Lumbar Spine W Wo Contrast (assess For Abscess, Cord Compression)  Result Date: 08/30/2018 CLINICAL DATA:  Heavy object fell patient on March 5th. Patient states now unable to move legs. Severe back pain. EXAM: MRI LUMBAR SPINE WITHOUT AND WITH CONTRAST TECHNIQUE: Multiplanar and multiecho pulse sequences of the lumbar spine were obtained without and with intravenous contrast. CONTRAST:  Gadavist 10 mL. COMPARISON:  No plain films or other cross-sectional imaging are available. FINDINGS:  Segmentation:  Standard. Alignment: Straightening of the normal lumbar lordosis. No subluxation. Vertebrae: Chronic deformities of L2 and L4 with endplate softening, but no acute fracture or bone marrow edema. Diffuse low signal intensity  bone marrow T1 and T2 weighted images, nonspecific, potentially related to anemia or body habitus, but in the setting of endplate softening is somewhat more concerning. No retropulsion. 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 Postcontrast, diffuse punctate abnormal enhancement of the spinous processes, greater than vertebral bodies, uncertain significance. Conus medullaris and cauda equina: Conus extends to the L1 level. Conus and cauda equina appear normal. Paraspinal and other soft tissues: Renal cystic disease, non worrisome. Disc levels: T12-L1: Normal. L1-L2:  Normal disc space.  Mild facet arthropathy.  No impingement. L2-L3:  Normal disc space.  Mild facet arthropathy.  No impingement. L3-L4: Mild congenital stenosis, posterior element hypertrophy. Normal disc space. No impingement. 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 Short pedicles. Annular bulge. Annular rent extends to the LEFT extraforaminal compartment, and is mildly vascularized indicating chronicity. BILATERAL subarticular zone narrowing affects both L5 nerve roots. BILATERAL foraminal narrowing is not clearly compressive. L5-S1: Mild congenital stenosis. Mild facet arthropathy. No impingement. 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 Subarticular zone narrowing could affect both L5 nerve roots. 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 Chronic appearing deformities of L2 and L4, but no acute or subacute bone marrow edema to suggest a recent injury. Diffusely abnormal bone marrow  signal, with subcentimeter foci of postcontrast enhancement. This could be related to severe anemia, or body habitus, but a marrow replacement process cannot completely be excluded. Hematologic consultation may be warranted. Electronically Signed   By: Staci Righter M.D.   On: 08/30/2018 15:22   Dg Bone Survey Met  Result Date: 08/31/2018 CLINICAL DATA:  Low back pain, history of multiple myeloma EXAM: METASTATIC BONE SURVEY COMPARISON:  None. FINDINGS: Small lucencies within the scapula bilaterally as can be seen with multiple myeloma. No other aggressive lytic or sclerotic osseous lesion. No periosteal reaction or bone destruction. No acute osseous abnormality. Chronic L1, L2 and L4 vertebral body compression fractures. Age-indeterminate T10, T9, T8, T6 vertebral body compression fractures. IMPRESSION: Small lucencies within the scapula bilaterally as can be seen with multiple myeloma. Age-indeterminate T6, T8, T9 and T10 vertebral body compression fractures. Electronically Signed   By: Kathreen Devoid   On: 08/31/2018 11:12    LOS: 2 days   Kerney Elbe, DO [facility] PAGER is on [place]  If 7PM-7AM, please contact night-coverage [email or web address] Password [email or web address] 09/01/2018, 1:28 PM

## 2018-09-01 NOTE — Evaluation (Signed)
Physical Therapy Evaluation Patient Details Name: Edward Todd MRN: [id number] DOB: [birth year] Today's Date: 09/01/2018   History of Present Illness  43 yo male admitted with symptomatic anemia, weakness, falls at home. Workup for possible multiple myeloma-oncology following. Hx of age indeterminate thoracic and lumbar compression fractures.   Clinical Impression  On eval, pt required Mod-Max assist +2 for mobility. He was able to stand ~10 seconds at the beside with RW support. Sharp, severe spasms in back and weakness cause pt to buckle-pain rated 10/10 with activity on today. Bil LE strength is at least 3/5 bilaterally. Min assist initially required for static sitting at EOB. This progressed to Min guard with time but pt is heavily reliant on bil UEs for static sitting balance. Discussed possible need for continued rehab after inpatient stay. At this time, recommendation is for CIR consult.     Follow Up Recommendations CIR    Equipment Recommendations  (continuing to assess)    Recommendations for Other Services Rehab consult     Precautions / Restrictions Precautions Precautions: Fall;Back Precaution Comments: back precautions/logroll Restrictions Weight Bearing Restrictions: No      Mobility  Bed Mobility Overal bed mobility: Needs Assistance Bed Mobility: Rolling;Sidelying to Sit;Sit to Sidelying Rolling: Mod assist;+2 for physical assistance;+2 for safety/equipment Sidelying to sit: Mod assist;+2 for physical assistance;+2 for safety/equipment     Sit to sidelying: Mod assist;+2 for physical assistance;+2 for safety/equipment General bed mobility comments: Assist for trunk and bil LEs. Increased time. Cues for safety, technique.   Transfers Overall transfer level: Needs assistance Equipment used: Rolling walker (2 wheeled) Transfers: Sit to/from Stand Sit to Stand: From elevated surface;Max assist;+2 physical assistance;+2 safety/equipment         General  transfer comment: Assist to rise, stabilize, control descent. Cues for safety, technique. Pt preferred to have both hands on RW handles. Increased time. Pt was able to stand for ~10 seconds before having to sit 2* spasms/severe pain.   Ambulation/Gait             General Gait Details: NT-pt not yet safely able 2* pain/fall risk  Stairs            Wheelchair Mobility    Modified Rankin (Stroke Patients Only)       Balance Overall balance assessment: History of Falls;Needs assistance         Standing balance support: Bilateral upper extremity supported Standing balance-Leahy Scale: Zero                               Pertinent Vitals/Pain Pain Assessment: 0-10 Pain Score: 10-Worst pain ever Pain Location: 10/10 along length of back Pain Descriptors / Indicators: Spasm;Grimacing;Guarding;Sharp Pain Intervention(s): Limited activity within patient's tolerance;Repositioned;RN gave pain meds during session    [place] expects to be discharged to:: Private residence Living Arrangements: Spouse/significant other   Type of Home: House Home Access: Stairs to enter   Technical brewer of Steps: 1 Home Layout: One level [place]: [place] - single point      Prior Function Level of Independence: Independent with assistive device(s)         Comments: using cane for ambulation up until 08/30/18 then had significant difficulty ambulating      Hand Dominance        Extremity/Trunk Assessment   Upper Extremity Assessment Upper Extremity Assessment: Defer to OT evaluation    Lower Extremity Assessment Lower Extremity Assessment: RLE deficits/detail;LLE  deficits/detail RLE Deficits / Details: hip flex at least 3/5, knee ext at least 3+/5. DP/PF 5/5. Limited by pain as well LLE Deficits / Details: hip flex at least 3/5, knee ext at least 3+/5. DP/PF 5/5. Limited by pain as well    Cervical / Trunk Assessment Cervical /  Trunk Assessment: Normal  Communication   Communication: No difficulties  Cognition Arousal/Alertness: Awake/alert Behavior During Therapy: WFL for tasks assessed/performed Overall Cognitive Status: Within Functional Limits for tasks assessed                                        General Comments      Exercises     Assessment/Plan    PT Assessment Patient needs continued PT services  PT Problem List Decreased strength;Decreased balance;Decreased mobility;Decreased activity tolerance;Pain;Decreased knowledge of use of DME       PT Treatment Interventions DME instruction;Gait training;Therapeutic activities;Functional mobility training;Balance training;Patient/family education;Therapeutic exercise    PT Goals (Current goals can be found in the Care Plan section)  Acute Rehab PT Goals Patient Stated Goal: less pain. regain ability to ambulate PT Goal Formulation: With patient Time For Goal Achievement: 09/15/18 Potential to Achieve Goals: Good    Frequency Min 3X/week   Barriers to discharge        Co-evaluation               AM-PAC PT "6 Clicks" Mobility  Outcome Measure Help needed turning from your back to your side while in a flat bed without using bedrails?: A Lot Help needed moving from lying on your back to sitting on the side of a flat bed without using bedrails?: A Lot Help needed moving to and from a bed to a chair (including a wheelchair)?: Total Help needed standing up from a chair using your arms (e.g., wheelchair or bedside chair)?: Total Help needed to walk in hospital room?: Total Help needed climbing 3-5 steps with a railing? : Total 6 Click Score: 8    End of Session Equipment Utilized During Treatment: Gait belt Activity Tolerance: Patient limited by pain Patient left: in bed;with call bell/phone within reach;with bed alarm set   PT Visit Diagnosis: Pain;Difficulty in walking, not elsewhere classified (R26.2);History of  falling (Z91.81);Repeated falls (R29.6);Other abnormalities of gait and mobility (R26.89) Pain - part of body: (back)    Time: 5997-7414 PT Time Calculation (min) (ACUTE ONLY): 42 min   Charges:   PT Evaluation $PT Eval Moderate Complexity: 1 Mod PT Treatments $Therapeutic Activity: 23-37 mins          Weston Anna, PT Acute Rehabilitation Services Pager: [phone] Office: [phone]

## 2018-09-01 NOTE — Progress Notes (Signed)
OT Cancellation Note  Patient Details Name: Edward Todd MRN: [id number] DOB: 08-21-1975   Cancelled Treatment:    Reason Eval/Treat Not Completed: Other (comment); just finished working with PT, will follow up for OT eval at a later time and as schedule permits.  Lou Cal, OT Supplemental Rehabilitation Services Pager [phone] Office [place] 09/01/2018, 2:27 PM

## 2018-09-02 DIAGNOSIS — N289 Disorder of kidney and ureter, unspecified: Secondary | ICD-10-CM

## 2018-09-02 DIAGNOSIS — D72819 Decreased white blood cell count, unspecified: Secondary | ICD-10-CM

## 2018-09-02 LAB — CBC WITH DIFFERENTIAL/PLATELET
Abs Immature Granulocytes: 0.02 10*3/uL (ref 0.00–0.07)
Basophils Absolute: 0 10*3/uL (ref 0.0–0.1)
Basophils Relative: 0 %
Eosinophils Absolute: 0 10*3/uL (ref 0.0–0.5)
Eosinophils Relative: 1 %
HCT: 23.2 % — ABNORMAL LOW (ref 39.0–52.0)
Hemoglobin: 7.5 g/dL — ABNORMAL LOW (ref 13.0–17.0)
Immature Granulocytes: 1 %
Lymphocytes Relative: 47 %
Lymphs Abs: 1.6 10*3/uL (ref 0.7–4.0)
MCH: 29.3 pg (ref 26.0–34.0)
MCHC: 32.3 g/dL (ref 30.0–36.0)
MCV: 90.6 fL (ref 80.0–100.0)
Monocytes Absolute: 0.3 10*3/uL (ref 0.1–1.0)
Monocytes Relative: 9 %
Neutro Abs: 1.4 10*3/uL — ABNORMAL LOW (ref 1.7–7.7)
Neutrophils Relative %: 42 %
Platelets: 196 10*3/uL (ref 150–400)
RBC: 2.56 MIL/uL — ABNORMAL LOW (ref 4.22–5.81)
RDW: 13.8 % (ref 11.5–15.5)
WBC: 3.4 10*3/uL — ABNORMAL LOW (ref 4.0–10.5)
nRBC: 0 % (ref 0.0–0.2)

## 2018-09-02 LAB — COMPREHENSIVE METABOLIC PANEL
ALT: 10 U/L (ref 0–44)
AST: 16 U/L (ref 15–41)
Albumin: 2.5 g/dL — ABNORMAL LOW (ref 3.5–5.0)
Alkaline Phosphatase: 49 U/L (ref 38–126)
Anion gap: 11 (ref 5–15)
BUN: 13 mg/dL (ref 6–20)
CO2: 24 mmol/L (ref 22–32)
Calcium: 9.5 mg/dL (ref 8.9–10.3)
Chloride: 97 mmol/L — ABNORMAL LOW (ref 98–111)
Creatinine, Ser: 1.28 mg/dL — ABNORMAL HIGH (ref 0.61–1.24)
GFR calc Af Amer: >60 mL/min (ref >60)
GFR calc non Af Amer: >60 mL/min (ref >60)
Glucose, Bld: 113 mg/dL — ABNORMAL HIGH (ref 70–99)
Potassium: 3.6 mmol/L (ref 3.5–5.1)
Sodium: 132 mmol/L — ABNORMAL LOW (ref 135–145)
Total Bilirubin: 0.4 mg/dL (ref 0.3–1.2)
Total Protein: 11.7 g/dL — ABNORMAL HIGH (ref 6.5–8.1)

## 2018-09-02 LAB — PHOSPHORUS: Phosphorus: 4.3 mg/dL (ref 2.5–4.6)

## 2018-09-02 LAB — MAGNESIUM: Magnesium: 1.9 mg/dL (ref 1.7–2.4)

## 2018-09-02 NOTE — Progress Notes (Signed)
Patient ID: Edward Todd, male   DOB: 1975/12/11, 43 y.o.   MRN: [id number] Aware of request for consideration of VP/KP on pt. Latest MRI thoracic spine will be reviewed by IR MD tomorrow and update given afterwards as to whether pt candidate for procedure.

## 2018-09-02 NOTE — Progress Notes (Signed)
Physical Therapy Treatment Patient Details Name: Edward Todd MRN: [id number] DOB: 03-25-1976 Today's Date: 09/02/2018    History of Present Illness 43 yo male admitted with symptomatic anemia, weakness, falls at home. Workup for possible multiple myeloma-oncology following. Hx of age indeterminate thoracic and lumbar compression fractures.     PT Comments    Pt very cooperative and motivated to mobilize but requiring increased time 2* back pain/spasms with movement.  Pt mod-max A +2 for bed mobility and needing significant increase in time to manage pain and breathing. Constant cues needed for safety, direction, and reassurance. Once seated EOB pt brushed teeth with mod A, needing increased time to shift weight to one hand for task.  At this time recommend pt d/c to CIR for intensive therapy to help pt safely return to mod I PLOF.   Follow Up Recommendations  CIR     Equipment Recommendations  None recommended by PT    Recommendations for Other Services Rehab consult     Precautions / Restrictions Precautions Precautions: Fall;Back Precaution Comments: back precautions/logroll Restrictions Weight Bearing Restrictions: No    Mobility  Bed Mobility Overal bed mobility: Needs Assistance Bed Mobility: Rolling;Sidelying to Sit;Sit to Sidelying Rolling: Mod assist;+2 for physical assistance;+2 for safety/equipment Sidelying to sit: +2 for physical assistance;+2 for safety/equipment;Max assist     Sit to sidelying: Max assist;+2 for physical assistance;+2 for safety/equipment General bed mobility comments: significant increased time per pt request to manage pain and breathing; assist needed at trunk and BLEs througout t/f, cues for technique and safety  Transfers                 General transfer comment: NT this date 2* pt fatigue from effort required for bed mobility and moveing to EOB sitting  Ambulation/Gait             General Gait Details: NT-pt not yet  safely able 2* pain/fall risk   Stairs             Wheelchair Mobility    Modified Rankin (Stroke Patients Only)       Balance Overall balance assessment: History of Falls;Needs assistance Sitting-balance support: Single extremity supported Sitting balance-Leahy Scale: Poor     Standing balance support: Bilateral upper extremity supported Standing balance-Leahy Scale: Zero                              Cognition Arousal/Alertness: Awake/alert Behavior During Therapy: WFL for tasks assessed/performed Overall Cognitive Status: Within Functional Limits for tasks assessed                                        Exercises      General Comments        Pertinent Vitals/Pain Pain Assessment: Faces Faces Pain Scale: Hurts worst Pain Location: along length of back Pain Descriptors / Indicators: Spasm;Grimacing;Guarding;Sharp Pain Intervention(s): Limited activity within patient's tolerance;Monitored during session;Premedicated before session    [place] expects to be discharged to:: Private residence Living Arrangements: Spouse/significant other Available Help at Discharge: Family Type of Home: House Home Access: Stairs to enter   [place]: One level [place]: Kasandra Knudsen - single point      Prior Function Level of Independence: Independent with assistive device(s)      Comments: usine cane for functional mobility until 08/30/18 then had sudden  increase and pain and decrease in function   PT Goals (current goals can now be found in the care plan section) Acute Rehab PT Goals Patient Stated Goal: decerase pain and keep moving to gain ind PT Goal Formulation: With patient Time For Goal Achievement: 09/15/18 Potential to Achieve Goals: Good Progress towards PT goals: Progressing toward goals    Frequency    Min 3X/week      PT Plan Current plan remains appropriate    Co-evaluation PT/OT/SLP  Co-Evaluation/Treatment: Yes Reason for Co-Treatment: For patient/therapist safety PT goals addressed during session: Mobility/safety with mobility OT goals addressed during session: ADL's and self-care      AM-PAC PT "6 Clicks" Mobility   Outcome Measure  Help needed turning from your back to your side while in a flat bed without using bedrails?: A Lot Help needed moving from lying on your back to sitting on the side of a flat bed without using bedrails?: A Lot Help needed moving to and from a bed to a chair (including a wheelchair)?: Total Help needed standing up from a chair using your arms (e.g., wheelchair or bedside chair)?: Total Help needed to walk in hospital room?: Total Help needed climbing 3-5 steps with a railing? : Total 6 Click Score: 8    End of Session   Activity Tolerance: Patient limited by pain;Patient limited by fatigue Patient left: in bed;with call bell/phone within reach;with bed alarm set Nurse Communication: Mobility status PT Visit Diagnosis: Pain;Difficulty in walking, not elsewhere classified (R26.2);History of falling (Z91.81);Repeated falls (R29.6);Other abnormalities of gait and mobility (R26.89)     Time: 7564-3329 PT Time Calculation (min) (ACUTE ONLY): 60 min  Charges:  $Therapeutic Activity: 23-37 mins                     [place] Pager [phone] Office [phone]    Ebrahim Deremer 09/02/2018, 1:32 PM

## 2018-09-02 NOTE — Progress Notes (Signed)
°PROGRESS NOTE ° ° ° °Edward Todd  MRN:[id number] DOB: 01/19/1976 DOA: 08/30/2018 °PCP: Patient, No Pcp Per  ° °Brief Narrative:  °HPI per Dr. Elizabeth Matthews on 08/30/2018 °Edward Todd is a 43 y.o. male with no significant past medical history had an accident at work in March 2020 where the oxygen tank fell on his back at the lower lumbar upper sacral area he went to Worker's [facility]. doctor and ordered muscle relaxant and pain pills but has not had any imaging done at that time.  Since March she has been out of work walking with a cane but today he was unable to get out of bed unable to walk even 1 step.  No urinary complaints no bowel complaints no nausea vomiting diarrhea fever chills abdominal pain chest pain shortness of breath cough headaches.  No complaints of hematemesis hematuria blood in the urine blood in stool.  He reports being treated for pneumonia over a month ago.  No complaints of pneumonia at this time or symptoms of pneumonia at this time.  No syncope but feels dizzy upon standing. ° °**Interim History  °Patient was still complaining of some back pain and soreness and states that it is more difficult to ambulate now.  I discussed with him about concern for multiple myeloma. He is now agreeable to Bone Biopsy. Bone scan showed multiple Thoracic and Lumbar Compression fractures and Thoracic Fractures were Age Indeterminate so an MRI was done of the Thoracic Spine and IR to review it to see if he is a candidate for Kyphoplasty/Vertebroplasty. ° °Assessment & Plan: °  °Active Problems: °  Symptomatic anemia °  Multiple myeloma (HCC) °  °Acute Anemia of unclear etiology at this time but likely from suspected Multiple Myeloma, stable °-The Hgb in 2014 and 2017 was normal and 14.2 and 14.8 respectively  °-Patient denies any hematemesis, hematochezia, hematuria.  °-Anemia Panel done and showed iron level of 57, U IBC 195, TIBC of 252, saturation ratios of 22, ferritin level 360, folate level  9.9, vitamin B12 level 250 °-DIC panel done showed a d-dimer of 0.88, fibrinogen level of 365, PT of 15.7, INR 1.3, APTT of 28 °-No evidence for Hemolytic or Nutritional Anemia per Oncology  °-FOBT has been ordered and is pending to be done  °-Checking SPEP and UPEP and will be checking 24-Hour Urine °-MRI of the lumbar spine concerning for abnormal marrow signal question marrow replacement process.  °-Patient's Hb/Hct went from 7.6/23.0 -> 7.6/22.7 -> 7.6/23.1 -> 7.5/23.2 °-Currentlyl holding off on blood transfusion at this time since he is asymptomatic from anemia standpoint. °-Oncology Consulted for further evaluation and currently patient is being worked up °-Continue to Monitor for S/Sx of Bleeding °-Repeat CBC in AM   °  °Suspected Multiple Myeloma °-Total Protein was >12 and Albumin was low at 2.6; Now Total Protein is 11.7 and Albumin is 2.5 °-Patient has Anemia and Severe Back Pain with Abnormal Marrow Signal on MRI of L Spine °-Thoracic Spine MRI showed Diffusely abnormal bone marrow signal highly suspicious for diffuse skeletal multiple myeloma. Pathologic mild-to-moderate thoracic compression fractures T3 and T6 through T10, but no associated marrow edema or complicating features. Normal thoracic spinal cord.  No spinal stenosis. Trace layering pleural effusions. °-SPEP ordered and Dr. Feng evaluated and ordering IFE, Light Chain Level, and Beta-2 Microglobulin (5.5) and collect 24 Hour Urine for UPEP/IFE °-LDH was 245 °-Ordered a bone survey to be done this showed in the scapula bilaterally that can be seen with multiple myeloma. Also   Also showed  age indeterminate T6, T8, T9, and T10 vertebral body compression fractures and Chronic L1, L2, and L4 Fractures -Patient's Total Protein ELP was 12.4, Total Globulin was 8.4, A/G Ratio was 0.5, Alpha-2-Globulin was 1.1, Beta Globulin was 6.8, and M-Spike was 5.7% -Dr. Burr Medico of Oncology recommending Bone Marrow Bx and this will be done on Tuesday 09/04/2018    Acute Lower Back Pain  -Since oxygen tank fell on his back in March 2020.   -He is very tender on the lower lumbar upper sacral spine.   -Bone Survey Shows Multiple Thoracic Fractures of Indeterminate Age and Chronic L1, L2, and L4 pain -IR to Evaluate and Treat and he has No Acute Lumbar Fractures so they will not intervene on those for KP/VP but they recommend MRI of Thoracic Spine to further investigate the Age Indeterminate Thoracic Fractures  -C/w Cyclobenzaprine 5 mg po TIDprn Muscle Spasms -Given IV Morphine 4 mg Once and will continue IV Hydromorphone 0.5 mg q3hprn Severe Pain  -Added 5 mg of Oxycodone every 8 PRN for Moderate Pain -PT/OT to evaluate and Treat and recommending CIR and have consulted Inpatient Rehab  Thoracic Compression Fractures -As Above  -Bone survey to be done this showed in the scapula bilaterally that can be seen with multiple myeloma. Also showed  age indeterminate T6, T8, T9, and T10 vertebral body compression fractures and Chronic L1, L2, and L4 Fractures -IR to Evaluate and Treat and he has No Acute Lumbar Fractures so they will not intervene on those for KP/VP but they recommend MRI of Thoracic Spine to further investigate the Age Indeterminate Thoracic Fractures  -Thoracic Spine MRI showed Diffusely abnormal bone marrow signal highly suspicious for diffuse skeletal multiple myeloma. Pathologic mild-to-moderate thoracic compression fractures T3 and T6 through T10, but no associated marrow edema or complicating features. Normal thoracic spinal cord.  No spinal stenosis. Trace layering pleural effusions. -IR to review Throracic MRI to see if he is a candidate for Kyphoplasty/Vertebroplasty tomorrow   Hypertension  -Secondary to pain as patient has no Hx of HTN -Started on IV Hydralazine 5 mg IV q4hprn HBP -BP is now improved and is 132/93  Obesity -Estimated body mass index is 35.55 kg/m as calculated from the following:   Height as of 03/21/16: 6'  3.5" (1.918 m).   Weight as of 03/21/16: 130.7 kg. -Weight Loss and Dietary Counseling given   Hyponatremia and Hypocholremia -Patient's Na+ was 131 and is now 132 -Chloride went from 101 -> 97 -Given NS bolus of 1 Liter since admission -Continue to Monitor and Trend  -Started Maintenance IVF with NS at 75 mL/hr as his half-normal saline at 100 mL's per hour is now stopped and was continued yesterday and will D/C later today as he has had 2 days of IVF -Repeat CMP in AM   Hyperphosphatemia -Patient's Phos Level was 2.4 and improved to 4.3 -Continue to Monitor and Replete as Necessary -Repeat CMP in AM   Hyperglycemia -Patient's Blood Sugars ranging from 111-114 on CMP's -Check HbA1c in AM  -Continue to Monitor Blood Sugars Carefully -If necessary will need to place on Sensitive Novolog SSI AC  Leukopenia -Patient's WBC is trending down and went from 4.9 -> 4.0 -> 3.8 -> 3.4 -Continue to Monitor and Likely from suspected MM -Repeat CBC in AM  Renal Insuffiencey -Mild -Patient's BUN/Cr went from 20/1.21 -> 16/1.19 -> 14/1.17 -> 13/1.28 -Will Stop IVF today as she has gotten 75 mL/hr for 2 Days -Continue to Monitor  and Avoid Nephrotoxic Medications if possible -Repeat CMP in AM   DVT prophylaxis: SCDs Code Status: FULL CODE Family Communication: No family present at bedside but Dr. Burr Medico is calling his wife Disposition Plan: Pending further work-up for multiple myeloma improvement of symptoms  Consultants:   Medical Oncology  Interventional Radiology    Procedures: Bone Survey  Bone Marrow Biopsy to be done on 09/04/2018   Antimicrobials:  Anti-infectives (From admission, onward)   None     Subjective: Seen and examined bedside and very difficult time ambulating and had a difficult time finest set up with assistance with therapy.  Therapy is recommending inpatient rehab and is open to this.  Awaiting biopsy on Tuesday and also awaiting interventional radiology  opinion about kyphoplasty/vertebroplasty as they are to review his thoracic spine MRI.  No other concerns or complaints at this time and states that his pain is fairly well-controlled but hurts when he tries to ambulate.   Objective: Vitals:   09/01/18 2055 09/02/18 0501 09/02/18 1115 09/02/18 1346  BP: (!) 131/97 (!) 130/97  (!) 132/93  Pulse: 97 96  96  Resp: _0 Temp: 98.8 F (37.1 C) 99 F (37.2 C)  98.4 F (36.9 C)  TempSrc: Oral Oral    SpO2: 97% 100% 97% 96%    Intake/Output Summary (Last 24 hours) at 09/02/2018 1517 Last data filed at 09/02/2018 1345 Gross per 24 hour  Intake 240 ml  Output 2700 ml  Net -2460 ml   There were no vitals filed for this visit.  Examination: Physical Exam:  Constitutional: Well-nourished, well-developed obese African-American male who is currently no acute distress appears calm but does seem a little bit uncomfortable working with therapy and is having a hard time moving Eyes: Lids and conjunctive are normal.  Sclerae anicteric ENMT: External ears and nose appear normal.  Grossly normal hearing.  Mucous members are moist Neck: Appears supple no JVD Respiratory: Mildly diminished at the bases with some slight crackles but no appreciable wheezing, rales, rhonchi.  Patient was not tachypneic or using any accessory muscles to breathe Cardiovascular: Regular rate and rhythm.  No appreciable murmurs, rubs, gallops.  No lower extremity edema noted Abdomen: Soft, nontender, distended secondary body habitus.  Bowel sounds present GU: Deferred Musculoskeletal: No contractures or cyanosis.  No joint deformities in the upper extremities Skin: Skin is warm and dry no appreciable rashes or lesions limited skin evaluation Neurologic: Cranial nerves II through XII grossly intact no appreciable focal deficits.  Romberg sign is cerebellar reflexes were not assessed Psychiatric: Pleasant mood and affect.  Intact judgment and insight.  Patient is awake,  alert, oriented x3  Data Reviewed: I have personally reviewed following labs and imaging studies  CBC: Recent Labs  Lab 08/30/18 1337 08/30/18 1733 08/31/18 0551 09/01/18 0602 09/02/18 0508  WBC 4.9  --  4.0 3.8* 3.4*  NEUTROABS 2.2  --   --  1.5* 1.4*  HGB 7.6*  --  7.6* 7.6* 7.5*  HCT 23.0*  --  22.7* 23.1* 23.2*  MCV 91.3  --  90.4 92.8 90.6  PLT 206 215 185 208 749   Basic Metabolic Panel: Recent Labs  Lab 08/30/18 1337 08/31/18 0551 09/01/18 0602 09/02/18 0508  NA 132* 132* 131* 132*  K 4.0 3.5 3.6 3.6  CL 101 101 97* 97*  CO2 _1 GLUCOSE 114* 112* 111* 113*  BUN _2 CREATININE 1.21 1.19 1.17 1.28*  9.7 9.6 9.4 9.5  °MG  --   --  1.8 1.9  °PHOS  --   --  2.4* 4.3  ° °GFR: °CrCl cannot be calculated (Unknown ideal weight.). °Liver Function Tests: °Recent Labs  °Lab 08/30/18 °1337 08/31/18 °0551 09/01/18 °0602 09/02/18 °0508  °AST 20 17 14* 16  °ALT 13 12 9 10  °ALKPHOS 50 51 49 49  °BILITOT 0.7 0.5 0.3 0.4  °PROT >12.0* >12.0* 11.9* 11.7*  °ALBUMIN 2.9* 2.6* 2.5* 2.5*  ° °No results for input(s): LIPASE, AMYLASE in the last 168 hours. °No results for input(s): AMMONIA in the last 168 hours. °Coagulation Profile: °Recent Labs  °Lab 08/30/18 °1733 08/31/18 °0551  °INR 1.3* 1.2  ° °Cardiac Enzymes: °No results for input(s): CKTOTAL, CKMB, CKMBINDEX, TROPONINI in the last 168 hours. °BNP (last 3 results) °No results for input(s): PROBNP in the last 8760 hours. °HbA1C: °No results for input(s): HGBA1C in the last 72 hours. °CBG: °No results for input(s): GLUCAP in the last 168 hours. °Lipid Profile: °No results for input(s): CHOL, HDL, LDLCALC, TRIG, CHOLHDL, LDLDIRECT in the last 72 hours. °Thyroid Function Tests: °No results for input(s): TSH, T4TOTAL, FREET4, T3FREE, THYROIDAB in the last 72 hours. °Anemia Panel: °Recent Labs  °  08/30/18 °1732 08/30/18 °1733  °VITAMINB12  --  250  °FOLATE  --  9.9  °FERRITIN  --  360*  °TIBC  --  252  °IRON  --  57   °RETICCTPCT 1.3  --   ° °Sepsis Labs: °No results for input(s): PROCALCITON, LATICACIDVEN in the last 168 hours. ° °Recent Results (from the past 240 hour(s))  °SARS Coronavirus 2 (CEPHEID - Performed in [place] hospital lab), Hosp Order     Status: None  ° Collection Time: 08/30/18  4:41 PM  °Result Value Ref Range Status  ° SARS Coronavirus 2 NEGATIVE NEGATIVE Final  °  Comment: (NOTE) °If result is NEGATIVE °SARS-CoV-2 target nucleic acids are NOT DETECTED. °The SARS-CoV-2 RNA is generally detectable in upper and lower  °respiratory specimens during the acute phase of infection. The lowest  °concentration of SARS-CoV-2 viral copies this assay can detect is 250  °copies / mL. A negative result does not preclude SARS-CoV-2 infection  °and should not be used as the sole basis for treatment or other  °patient management decisions.  A negative result may occur with  °improper specimen collection / handling, submission of specimen other  °than nasopharyngeal swab, presence of viral mutation(s) within the  °areas targeted by this assay, and inadequate number of viral copies  °(<250 copies / mL). A negative result must be combined with clinical  °observations, patient history, and epidemiological information. °If result is POSITIVE °SARS-CoV-2 target nucleic acids are DETECTED. °The SARS-CoV-2 RNA is generally detectable in upper and lower  °respiratory specimens dur °ing the acute phase of infection.  Positive  °results are indicative of active infection with SARS-CoV-2.  Clinical  °correlation with patient history and other diagnostic information is  °necessary to determine patient infection status.  Positive results do  °not rule out bacterial infection or co-infection with other viruses. °If result is PRESUMPTIVE POSTIVE °SARS-CoV-2 nucleic acids MAY BE PRESENT.   °A presumptive positive result was obtained on the submitted specimen  °and confirmed on repeat testing.  While 2019 novel coronavirus  °(SARS-CoV-2)  nucleic acids may be present in the submitted sample  °additional confirmatory testing may be necessary for epidemiological  °and / or clinical management purposes  to differentiate   differentiate between  SARS-CoV-2 and other Sarbecovirus currently known to infect humans.  If clinically indicated additional testing with an alternate test  methodology [phone]) is advised. The SARS-CoV-2 RNA is generally  detectable in upper and lower respiratory sp ecimens during the acute  phase of infection. The expected result is Negative. Fact Sheet for Patients:  StrictlyIdeas.no Fact Sheet for Healthcare Providers: BankingDealers.co.za This test is not yet approved or cleared by the [place] FDA and has been authorized for detection and/or diagnosis of SARS-CoV-2 by FDA under an Emergency Use Authorization (EUA).  This EUA will remain in effect (meaning this test can be used) for the duration of the COVID-19 declaration under Section 564(b)(1) of the Act, 21 U.S.C. section 360bbb-3(b)(1), unless the authorization is terminated or revoked sooner. Performed at [facility], [address], [place], [place] [address]     Radiology Studies: Mr Thoracic Spine Wo Contrast  Result Date: 09/01/2018 CLINICAL DATA:  43 year old male with back pain and anemia, suspicion of multiple myeloma. EXAM: MRI THORACIC SPINE WITHOUT CONTRAST TECHNIQUE: Multiplanar, multisequence MR imaging of the thoracic spine was performed. No intravenous contrast was administered. COMPARISON:  Skeletal survey 08/31/2018.  Lumbar MRI 08/30/2018. FINDINGS: Limited cervical spine imaging: Heterogeneous bone marrow signal particularly in the mid and lower cervical vertebrae. Thoracic spine segmentation: Normal on the comparison radiographs. And this numbering system appears concordant with that on the recent lumbar MRI. Alignment: Mildly exaggerated thoracic kyphosis. No  spondylolisthesis. Vertebrae: Diffusely abnormal bone marrow signal throughout the thoracic spine and visible ribs, most apparent on series 9. Pathologic compression fractures of T3 (mild), and T6 through T10 (mild-to-moderate). However, no confluent marrow edema at these levels. No retropulsion of bone. Cord: Spinal cord signal is within normal limits at all visualized levels. Mostly visible conus medullaris at T12-L1 appears normal. There is mild thoracic epidural lipomatosis which is maximal at the T5 and T6 levels. Paraspinal and other soft tissues: Small or trace layering pleural effusions. Otherwise negative visible thoracic viscera. Grossly negative visible upper abdominal viscera. No paraspinal soft tissue edema. Disc levels: No significant superimposed degenerative changes. IMPRESSION: 1. Diffusely abnormal bone marrow signal highly suspicious for diffuse skeletal multiple myeloma. 2. Pathologic mild-to-moderate thoracic compression fractures T3 and T6 through T10, but no associated marrow edema or complicating features. 3. Normal thoracic spinal cord.  No spinal stenosis. 4. Trace layering pleural effusions. Electronically Signed   By: Genevie Ann M.D.   On: 09/01/2018 19:59    LOS: 3 days   Kerney Elbe, DO [facility] PAGER is on [place]  If 7PM-7AM, please contact night-coverage [email or web address] Password [email or web address] 09/02/2018, 3:17 PM

## 2018-09-02 NOTE — Evaluation (Signed)
Occupational Therapy Evaluation Patient Details Name: Edward Todd MRN: [id number] DOB: [birth year] Today's Date: 09/02/2018    History of Present Illness 43 yo male admitted with symptomatic anemia, weakness, falls at home. Workup for possible multiple myeloma-oncology following. Hx of age indeterminate thoracic and lumbar compression fractures.    Clinical Impression   Pt admitted with above diagnoses, with thoracic spine pain, LE weakness, and decreased activity tolerance limiting ability to engage in BADL at desired level of ind. PTA pt states he was mod I using a cane prior to 5/7, then experiencing sudden onset of debilitating pain which eventually lead to 3 falls at home. At time of eval pt very motivated to work with OT. Pt mod-max A +2 for bed mobility and needing significant increase in time to manage pain and breathing. Constant cues needed for safety, direction, and reassurance. Once seated EOB pt brushed teeth with mod A, needing increased time to shift weight to one hand for task. Assist needed for spitting, water, and wiping mouth in this task. Pt stopped mid brushing teeth x2 to brace self with RUE due to pain. At this time recommend pt d/c to CIR for intensive therapy to help pt safely return to mod I PLOF. Will continue to follow acutely and administer acute OT services during IP stay.    Follow Up Recommendations  CIR    Equipment Recommendations  Other (comment)(defer to next venue)    Recommendations for Other Services       Precautions / Restrictions Precautions Precautions: Fall;Back Precaution Comments: back precautions/logroll Restrictions Weight Bearing Restrictions: No      Mobility Bed Mobility Overal bed mobility: Needs Assistance Bed Mobility: Rolling;Sidelying to Sit;Sit to Sidelying Rolling: Mod assist;+2 for physical assistance;+2 for safety/equipment Sidelying to sit: +2 for physical assistance;+2 for safety/equipment;Max assist     Sit to  sidelying: Max assist;+2 for physical assistance;+2 for safety/equipment General bed mobility comments: significant increased time per pt request to manage pain and breathing; assist needed at trunk and BLEs througout t/f, cues for technique and safety  Transfers                 General transfer comment: NT this date    Balance Overall balance assessment: History of Falls                                         ADL either performed or assessed with clinical judgement   ADL Overall ADL's : Needs assistance/impaired Eating/Feeding: Minimal assistance;Moderate assistance;Sitting Eating/Feeding Details (indicate cue type and reason): sitting EOB, needs to use BUEs for support sitting EOB. Able to use one UE for few minutes, then requiring assist Grooming: Moderate assistance;Sitting Grooming Details (indicate cue type and reason): sitting EOB, increased time to be able to shift weight to hold TB with R hand. Needed management for water, spitting, wiping mouth. Pt having to stop x2 and keep TB in mouth and place RUE down on bed to brace for pain Upper Body Bathing: Maximal assistance;Sitting Upper Body Bathing Details (indicate cue type and reason): increased time and assist when sitting Lower Body Bathing: Total assistance;Sitting/lateral leans;Sit to/from stand;Adhering to back precautions   Upper Body Dressing : Maximal assistance;Sitting   Lower Body Dressing: Total assistance;Sitting/lateral leans;Sit to/from stand;Adhering to back precautions   Toilet Transfer: Total assistance;BSC;RW Toilet Transfer Details (indicate cue type and reason): currently using bed pan and urinal at  bed level; OOB t/f not able to be safely attempted this date Toileting- Clothing Manipulation and Hygiene: Total assistance;Adhering to back precautions   Tub/ Shower Transfer: Total assistance   Functional mobility during ADLs: Total assistance General ADL Comments: pt currently total  A for OOB activities and general max A for ADL, pain limiting- anticipate this to improve with pain management and pt motivation     Vision Baseline Vision/History: Wears glasses Patient Visual Report: No change from baseline       Perception     Praxis      Pertinent Vitals/Pain Pain Assessment: Faces Faces Pain Scale: Hurts worst Pain Location: along length of back Pain Descriptors / Indicators: Spasm;Grimacing;Guarding;Sharp Pain Intervention(s): Limited activity within patient's tolerance;Monitored during session;Repositioned;Premedicated before session     Hand Dominance     Extremity/Trunk Assessment Upper Extremity Assessment Upper Extremity Assessment: Overall WFL for tasks assessed(5/5 BUE strength of benefit to transfers)   Lower Extremity Assessment Lower Extremity Assessment: Defer to PT evaluation       Communication Communication Communication: No difficulties   Cognition Arousal/Alertness: Awake/alert Behavior During Therapy: WFL for tasks assessed/performed Overall Cognitive Status: Within Functional Limits for tasks assessed                                     General Comments       Exercises     Shoulder Instructions      Home Living Family/patient expects to be discharged to:: Private residence Living Arrangements: Spouse/significant other Available Help at Discharge: Family Type of Home: House Home Access: Stairs to enter Technical brewer of Steps: 1   Home Layout: One level               Home Equipment: Cane - single point          Prior Functioning/Environment Level of Independence: Independent with assistive device(s)        Comments: usine cane for functional mobility until 08/30/18 then had sudden increase and pain and decrease in function        OT Problem List: Decreased strength;Decreased activity tolerance;Decreased knowledge of use of DME or AE;Impaired balance (sitting and/or  standing);Decreased coordination;Pain      OT Treatment/Interventions: Self-care/ADL training;DME and/or AE instruction;Therapeutic activities;Balance training;Therapeutic exercise;Energy conservation;Patient/family education    OT Goals(Current goals can be found in the care plan section) Acute Rehab OT Goals Patient Stated Goal: decerase pain and keep moving to gain ind OT Goal Formulation: With patient Time For Goal Achievement: 09/16/18 Potential to Achieve Goals: Good  OT Frequency: Min 2X/week   Barriers to D/C:            Co-evaluation PT/OT/SLP Co-Evaluation/Treatment: Yes Reason for Co-Treatment: For patient/therapist safety;Complexity of the patient's impairments (multi-system involvement);To address functional/ADL transfers PT goals addressed during session: Mobility/safety with mobility OT goals addressed during session: ADL's and self-care      AM-PAC OT "6 Clicks" Daily Activity     Outcome Measure Help from another person eating meals?: A Lot(if seated EOB) Help from another person taking care of personal grooming?: A Lot(brush teeth EOB) Help from another person toileting, which includes using toliet, bedpan, or urinal?: Total Help from another person bathing (including washing, rinsing, drying)?: Total Help from another person to put on and taking off regular upper body clothing?: A Lot Help from another person to put on and taking off regular lower body clothing?: Total  6 Click Score: 9   End of Session Nurse Communication: Mobility status  Activity Tolerance: Patient tolerated treatment well Patient left: in bed;with call bell/phone within reach  OT Visit Diagnosis: History of falling (Z91.81);Repeated falls (R29.6);Other abnormalities of gait and mobility (R26.89);Muscle weakness (generalized) (M62.81);Unsteadiness on feet (R26.81);Pain Pain - part of body: (back)                Time: 9747-1855 OT Time Calculation (min): 58 min Charges:  OT General  Charges $OT Visit: 1 Visit OT Evaluation $OT Eval Moderate Complexity: 1 Mod OT Treatments $Self Care/Home Management : 8-22 mins  Zenovia Jarred, MSOT, OTR/L Behavioral Health OT/ Acute Relief OT [facility] Office: [phone]   Zenovia Jarred 09/02/2018, 1:02 PM

## 2018-09-03 LAB — CBC WITH DIFFERENTIAL/PLATELET
Abs Immature Granulocytes: 0.01 10*3/uL (ref 0.00–0.07)
Basophils Absolute: 0 10*3/uL (ref 0.0–0.1)
Basophils Relative: 0 %
Eosinophils Absolute: 0 10*3/uL (ref 0.0–0.5)
Eosinophils Relative: 1 %
HCT: 22.1 % — ABNORMAL LOW (ref 39.0–52.0)
Hemoglobin: 7.3 g/dL — ABNORMAL LOW (ref 13.0–17.0)
Immature Granulocytes: 0 %
Lymphocytes Relative: 43 %
Lymphs Abs: 1.4 10*3/uL (ref 0.7–4.0)
MCH: 29.8 pg (ref 26.0–34.0)
MCHC: 33 g/dL (ref 30.0–36.0)
MCV: 90.2 fL (ref 80.0–100.0)
Monocytes Absolute: 0.3 10*3/uL (ref 0.1–1.0)
Monocytes Relative: 9 %
Neutro Abs: 1.5 10*3/uL — ABNORMAL LOW (ref 1.7–7.7)
Neutrophils Relative %: 47 %
Platelets: 204 10*3/uL (ref 150–400)
RBC: 2.45 MIL/uL — ABNORMAL LOW (ref 4.22–5.81)
RDW: 13.8 % (ref 11.5–15.5)
WBC: 3.3 10*3/uL — ABNORMAL LOW (ref 4.0–10.5)
nRBC: 0 % (ref 0.0–0.2)

## 2018-09-03 LAB — KAPPA/LAMBDA LIGHT CHAINS
Kappa free light chain: 664.4 mg/L — ABNORMAL HIGH (ref 3.3–19.4)
Kappa, lambda light chain ratio: 103.81 — ABNORMAL HIGH (ref 0.26–1.65)
Lambda free light chains: 6.4 mg/L (ref 5.7–26.3)

## 2018-09-03 LAB — COMPREHENSIVE METABOLIC PANEL
ALT: 9 U/L (ref 0–44)
AST: 16 U/L (ref 15–41)
Albumin: 2.7 g/dL — ABNORMAL LOW (ref 3.5–5.0)
Alkaline Phosphatase: 50 U/L (ref 38–126)
Anion gap: 9 (ref 5–15)
BUN: 15 mg/dL (ref 6–20)
CO2: 23 mmol/L (ref 22–32)
Calcium: 9.7 mg/dL (ref 8.9–10.3)
Chloride: 98 mmol/L (ref 98–111)
Creatinine, Ser: 1.29 mg/dL — ABNORMAL HIGH (ref 0.61–1.24)
GFR calc Af Amer: >60 mL/min (ref >60)
GFR calc non Af Amer: >60 mL/min (ref >60)
Glucose, Bld: 119 mg/dL — ABNORMAL HIGH (ref 70–99)
Potassium: 3.6 mmol/L (ref 3.5–5.1)
Sodium: 130 mmol/L — ABNORMAL LOW (ref 135–145)
Total Bilirubin: 0.3 mg/dL (ref 0.3–1.2)
Total Protein: >12 g/dL — ABNORMAL HIGH (ref 6.5–8.1)

## 2018-09-03 LAB — MAGNESIUM: Magnesium: 1.9 mg/dL (ref 1.7–2.4)

## 2018-09-03 LAB — PHOSPHORUS: Phosphorus: 4.1 mg/dL (ref 2.5–4.6)

## 2018-09-03 MED ORDER — DOCUSATE SODIUM 100 MG PO CAPS
100.0000 mg | ORAL_CAPSULE | Freq: Two times a day (BID) | ORAL | Status: DC
  Administered 2018-09-03 – 2018-09-06 (×6): 100 mg via ORAL
  Filled 2018-09-03 (×6): qty 1

## 2018-09-03 MED ORDER — POLYETHYLENE GLYCOL 3350 17 G PO PACK
17.0000 g | PACK | Freq: Every day | ORAL | Status: DC
  Administered 2018-09-03 – 2018-09-07 (×5): 17 g via ORAL
  Filled 2018-09-03 (×5): qty 1

## 2018-09-03 NOTE — Consult Note (Signed)
Chief Complaint: Patient was seen in consultation today for possible vertebroplasty/kyphoplasty Chief Complaint  Patient presents with   Back Pain    Referring Physician(s): Feng,Y  Supervising Physician: Markus Daft  Patient Status: [facility] - In-pt  History of Present Illness: Edward Todd is a 43 y.o. male with no significant past medical history who suffered an accident at work in March 2020 in which an oxygen tank fell onto his back in the lower lumbar/upper sacral region.  He presented to [facility] on 5/7 due to persistent/progressive lower back pain along with muscle spasms.  His back pain is exacerbated with movement.  Subsequent lumbar MRI revealed chronic appearing deformities of L2, L4 but no acute or subacute bone marrow edema to suggest a recent injury.  There was diffuse abnormal bone marrow signal noted with marrow replacement process not completely excluded, specifically multiple myeloma.  Thoracic MRI revealed pathologic mild to moderate compression fractures T3 and T6-T10 but no associated marrow edema or complicating features.  Request received for consideration of possible vertebroplasty kyphoplasty on patient.  He is scheduled for bone marrow biopsy on 5/12 by oncology.     History reviewed. No pertinent past medical history.  History reviewed. No pertinent surgical history.  Allergies: Shellfish allergy  Medications: Prior to Admission medications   Medication Sig Start Date End Date Taking? Authorizing Provider  Ascorbic Acid (VITAMIN C PO) Take 1 tablet by mouth daily.   Yes [provider]  Cyanocobalamin (B-12 PO) Take 1 tablet by mouth daily.   Yes [provider]  meloxicam (MOBIC) 15 MG tablet Take 7.5-15 mg by mouth daily. 08/13/18  Yes [provider]  methocarbamol (ROBAXIN) 500 MG tablet Take 500-1,000 mg by mouth at bedtime as needed for muscle pain. 08/13/18  Yes [provider]  POTASSIUM PO  Take 1 tablet by mouth daily.   Yes [provider]  valACYclovir (VALTREX) 1000 MG tablet Take 1,000 mg by mouth daily as needed (cold sores).  08/27/18  Yes [provider]  albuterol (PROVENTIL HFA;VENTOLIN HFA) 108 (90 Base) MCG/ACT inhaler Inhale 2 puffs into the lungs every 4 (four) hours as needed for wheezing or shortness of breath (cough, shortness of breath or wheezing.). Patient not taking: Reported on 08/30/2018 02/25/16   Joretta Bachelor, PA     Family History  Problem Relation Age of Onset   Diabetes Maternal Grandmother    Diabetes Paternal Grandfather     Social History   Socioeconomic History   Marital status: Single    Spouse name: Not on file   Number of children: Not on file   Years of education: Not on file   Highest education level: Not on file  Occupational History   Not on file  Social Needs   Financial resource strain: Not on file   Food insecurity:    Worry: Not on file    Inability: Not on file   Transportation needs:    Medical: Not on file    Non-medical: Not on file  Tobacco Use   Smoking status: Never Smoker   Smokeless tobacco: Never Used  Substance and Sexual Activity   Alcohol use: No    Alcohol/week: 0.0 standard drinks   Drug use: No   Sexual activity: Yes  Lifestyle   Physical activity:    Days per week: Not on file    Minutes per session: Not on file   Stress: Not on file  Relationships   Social connections:  Talks on phone: Not on file    Gets together: Not on file    Attends religious service: Not on file    Active member of club or organization: Not on file    Attends meetings of clubs or organizations: Not on file    Relationship status: Not on file  Other Topics Concern   Not on file  Social History Narrative   Marital status:  Single; not dating.       Children:  1 child (2yo)      Lives: with son.      Employment: [employer] x 2 months. Moved from [place].       Tobacco:  None      Alcohol: none      Drugs: none      Exercise:  Three days per week.       Seatbelt:  100%; some texting n driving.      Guns:  None      Sexual activity:  5; no STDs; females only.   Education: College            Review of Systems : see above; denies fever, headache, respiratory difficulties, nausea, vomiting or bleeding.  Denies problems with bowel or bladder function.  Vital Signs: BP 120/80 (BP Location: Right Arm)    Pulse 92    Temp 98.9 F (37.2 C) (Oral)    Resp 16    Ht 6' 3.51" (1.918 m)    Wt 288 lb 2.3 oz (130.7 kg)    SpO2 95%    BMI 35.53 kg/m   Physical Exam awake, alert.  Palpation along thoracic spine reveals no significant paravertebral tenderness; there is some mild to moderate lower lumbar/sacral region tenderness to palpation.  Pain is exacerbated with movement.   Imaging: Mr Thoracic Spine Wo Contrast  Result Date: 09/01/2018 CLINICAL DATA:  43 year old male with back pain and anemia, suspicion of multiple myeloma. EXAM: MRI THORACIC SPINE WITHOUT CONTRAST TECHNIQUE: Multiplanar, multisequence MR imaging of the thoracic spine was performed. No intravenous contrast was administered. COMPARISON:  Skeletal survey 08/31/2018.  Lumbar MRI 08/30/2018. FINDINGS: Limited cervical spine imaging: Heterogeneous bone marrow signal particularly in the mid and lower cervical vertebrae. Thoracic spine segmentation: Normal on the comparison radiographs. And this numbering system appears concordant with that on the recent lumbar MRI. Alignment: Mildly exaggerated thoracic kyphosis. No spondylolisthesis. Vertebrae: Diffusely abnormal bone marrow signal throughout the thoracic spine and visible ribs, most apparent on series 9. Pathologic compression fractures of T3 (mild), and T6 through T10 (mild-to-moderate). However, no confluent marrow edema at these levels. No retropulsion of bone. Cord: Spinal cord signal is within normal limits at all visualized levels. Mostly  visible conus medullaris at T12-L1 appears normal. There is mild thoracic epidural lipomatosis which is maximal at the T5 and T6 levels. Paraspinal and other soft tissues: Small or trace layering pleural effusions. Otherwise negative visible thoracic viscera. Grossly negative visible upper abdominal viscera. No paraspinal soft tissue edema. Disc levels: No significant superimposed degenerative changes. IMPRESSION: 1. Diffusely abnormal bone marrow signal highly suspicious for diffuse skeletal multiple myeloma. 2. Pathologic mild-to-moderate thoracic compression fractures T3 and T6 through T10, but no associated marrow edema or complicating features. 3. Normal thoracic spinal cord.  No spinal stenosis. 4. Trace layering pleural effusions. Electronically Signed   By: Genevie Ann M.D.   On: 09/01/2018 19:59   Mr Lumbar Spine W Wo Contrast (assess For Abscess, Cord Compression)  Result Date: 08/30/2018 CLINICAL  DATA:  Heavy object fell patient on March 5th. Patient states now unable to move legs. Severe back pain. EXAM: MRI LUMBAR SPINE WITHOUT AND WITH CONTRAST TECHNIQUE: Multiplanar and multiecho pulse sequences of the lumbar spine were obtained without and with intravenous contrast. CONTRAST:  Gadavist 10 mL. COMPARISON:  No plain films or other cross-sectional imaging are available. FINDINGS: Segmentation:  Standard. Alignment: Straightening of the normal lumbar lordosis. No subluxation. Vertebrae: Chronic deformities of L2 and L4 with endplate softening, but no acute fracture or bone marrow edema. Diffuse low signal intensity bone marrow T1 and T2 weighted images, nonspecific, potentially related to anemia or body habitus, but in the setting of endplate softening is somewhat more concerning. No retropulsion. 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 Postcontrast, diffuse punctate abnormal enhancement of the spinous processes, greater than vertebral bodies, uncertain significance. Conus  medullaris and cauda equina: Conus extends to the L1 level. Conus and cauda equina appear normal. Paraspinal and other soft tissues: Renal cystic disease, non worrisome. Disc levels: T12-L1: Normal. L1-L2:  Normal disc space.  Mild facet arthropathy.  No impingement. L2-L3:  Normal disc space.  Mild facet arthropathy.  No impingement. L3-L4: Mild congenital stenosis, posterior element hypertrophy. Normal disc space. No impingement. 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 Short pedicles. Annular bulge. Annular rent extends to the LEFT extraforaminal compartment, and is mildly vascularized indicating chronicity. BILATERAL subarticular zone narrowing affects both L5 nerve roots. BILATERAL foraminal narrowing is not clearly compressive. L5-S1: Mild congenital stenosis. Mild facet arthropathy. No impingement. 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 Subarticular zone narrowing could affect both L5 nerve roots. 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 Chronic appearing deformities of L2 and L4, but no acute or subacute bone marrow edema to suggest a recent injury. Diffusely abnormal bone marrow signal, with subcentimeter foci of postcontrast enhancement. This could be related to severe anemia, or body habitus, but a marrow replacement process cannot completely be excluded. Hematologic consultation may be warranted. Electronically Signed   By: Staci Righter M.D.   On: 08/30/2018 15:22   Dg Bone Survey Met  Result Date: 08/31/2018 CLINICAL DATA:  Low back pain, history of multiple myeloma EXAM: METASTATIC BONE SURVEY COMPARISON:  None. FINDINGS: Small lucencies within the scapula bilaterally as can be seen with multiple myeloma. No other aggressive lytic or sclerotic osseous lesion. No periosteal reaction or bone  destruction. No acute osseous abnormality. Chronic L1, L2 and L4 vertebral body compression fractures. Age-indeterminate T10, T9, T8, T6 vertebral body compression fractures. IMPRESSION: Small lucencies within the scapula bilaterally as can be seen with multiple myeloma. Age-indeterminate T6, T8, T9 and T10 vertebral body compression fractures. Electronically Signed   By: Kathreen Devoid   On: 08/31/2018 11:12    Labs:  CBC: Recent Labs    08/31/18 0551 09/01/18 0602 09/02/18 0508 09/03/18 0517  WBC 4.0 3.8* 3.4* 3.3*  HGB 7.6* 7.6* 7.5* 7.3*  HCT 22.7* 23.1* 23.2* 22.1*  PLT 185 208 196 204    COAGS: Recent Labs    08/30/18 1733 08/31/18 0551  INR 1.3* 1.2  APTT 28 35    BMP: Recent Labs    08/31/18 0551 09/01/18 0602 09/02/18 0508 09/03/18 0517  NA 132* 131* 132* 130*  K 3.5 3.6 3.6 3.6  CL 101 97* 97*  98  CO2 _0 GLUCOSE 112* 111* 113* 119*  BUN _1 CALCIUM 9.6 9.4 9.5 9.7  CREATININE 1.19 1.17 1.28* 1.29*  GFRNONAA >60 >60 >60 >60  GFRAA >60 >60 >60 >60    LIVER FUNCTION TESTS: Recent Labs    08/31/18 0551 09/01/18 0602 09/02/18 0508 09/03/18 0517  BILITOT 0.5 0.3 0.4 0.3  AST 17 14* 16 16  ALT _2 ALKPHOS 51 49 49 50  PROT >12.0* 11.9* 11.7* >12.0*  ALBUMIN 2.6* 2.5* 2.5* 2.7*    TUMOR MARKERS: No results for input(s): AFPTM, CEA, CA199, CHROMGRNA in the last 8760 hours.  Assessment and Plan: 43 y.o. male with no significant past medical history who suffered an accident at work in March 2020 in which an oxygen tank fell onto his back in the lower lumbar/upper sacral region.  He presented to [facility] on 5/7 due to persistent/progressive lower back pain along with muscle spasms.  His back pain is exacerbated with movement.  Subsequent lumbar MRI revealed chronic appearing deformities of L2, L4 but no acute or subacute bone marrow edema to suggest a recent injury.  There was diffuse abnormal bone marrow signal noted  with marrow replacement process not completely excluded, specifically multiple myeloma.  Patient has positive M spike on SPEP. Thoracic MRI revealed pathologic mild to moderate compression fractures T3 and T6-T10 but no associated marrow edema or complicating features.  Request received for consideration of possible vertebroplasty kyphoplasty on patient.  He is scheduled for bone marrow biopsy on 5/12 by oncology.  Imaging studies were reviewed by Dr. Anselm Pancoast.  Patient exhibits no significant paravertebral tenderness in the thoracic spine region.  Majority of his pain is in the lower lumbar/sacral region.  There are no findings to suggest acute or subacute fractures of the thoracic or lumbar spine or associated edema based on MRI review, therefore VP/ KP not indicated at this time.    Thank you for this interesting consult.  I greatly enjoyed meeting Edward Todd and look forward to participating in their care.  A copy of this report was sent to the requesting provider on this date.  Electronically Signed: D. Rowe Robert, PA-C 09/03/2018, 2:09 PM   I spent a total of 25 minutes  in face to face in clinical consultation, greater than 50% of which was counseling/coordinating care for vertebroplasty/kyphoplasty

## 2018-09-03 NOTE — Progress Notes (Addendum)
Edward Todd   DOB:10-28-1975   XF#:[id number]   IRC#:[id number]  Hem/Onc follow up note   Subjective: The patient reports ongoing back pain.  He is still not walking.  Reports constipation this morning.  The patient is scheduled for a bone marrow biopsy tomorrow morning.  IR has been consulted for possible vertebroplasty/kyphoplasty.  The patient has no other complaints today.  Objective:  Vitals:   09/03/18 0500 09/03/18 0813  BP: 121/84 120/80  Pulse: 95 92  Resp: 17 16  Temp: 98.9 F (37.2 C) 98.9 F (37.2 C)  SpO2: 95%     Body mass index is 35.53 kg/m.  Intake/Output Summary (Last 24 hours) at 09/03/2018 1012 Last data filed at 09/03/2018 0500 Gross per 24 hour  Intake 675 ml  Output 2700 ml  Net -2025 ml     Sclerae unicteric  Oropharynx clear  No peripheral adenopathy  Lungs clear -- no rales or rhonchi  Heart regular rate and rhythm  Abdomen benign  Neuro nonfocal    CBG (last 3)  No results for input(s): GLUCAP in the last 72 hours.   Labs:  Urine Studies No results for input(s): UHGB, CRYS in the last 72 hours.  Invalid input(s): UACOL, UAPR, USPG, UPH, UTP, UGL, [place], UBIL, UNIT, UROB, [place], UEPI, UWBC, Junie Panning [place], [place], [name]  Basic Metabolic Panel: Recent Labs  Lab 08/30/18 1337 08/31/18 0551 09/01/18 0602 09/02/18 0508 09/03/18 0517  NA 132* 132* 131* 132* 130*  K 4.0 3.5 3.6 3.6 3.6  CL 101 101 97* 97* 98  CO2 '23 22 24 24 23  ' GLUCOSE 114* 112* 111* 113* 119*  BUN '20 16 14 13 15  ' CREATININE 1.21 1.19 1.17 1.28* 1.29*  CALCIUM 9.7 9.6 9.4 9.5 9.7  MG  --   --  1.8 1.9 1.9  PHOS  --   --  2.4* 4.3 4.1   GFR Estimated Creatinine Clearance: 107.2 mL/min (A) (by C-G formula based on SCr of 1.29 mg/dL (H)). Liver Function Tests: Recent Labs  Lab 08/30/18 1337 08/31/18 0551 09/01/18 0602 09/02/18 0508 09/03/18 0517  AST 20 17 14* 16 16  ALT '13 12 9 10 9  ' ALKPHOS 50 51 49 49 50  BILITOT 0.7 0.5 0.3 0.4 0.3  PROT >12.0*  >12.0* 11.9* 11.7* >12.0*  ALBUMIN 2.9* 2.6* 2.5* 2.5* 2.7*   No results for input(s): LIPASE, AMYLASE in the last 168 hours. No results for input(s): AMMONIA in the last 168 hours. Coagulation profile Recent Labs  Lab 08/30/18 1733 08/31/18 0551  INR 1.3* 1.2    CBC: Recent Labs  Lab 08/30/18 1337 08/30/18 1733 08/31/18 0551 09/01/18 0602 09/02/18 0508 09/03/18 0517  WBC 4.9  --  4.0 3.8* 3.4* 3.3*  NEUTROABS 2.2  --   --  1.5* 1.4* 1.5*  HGB 7.6*  --  7.6* 7.6* 7.5* 7.3*  HCT 23.0*  --  22.7* 23.1* 23.2* 22.1*  MCV 91.3  --  90.4 92.8 90.6 90.2  PLT 206 215 185 208 196 204   Cardiac Enzymes: No results for input(s): CKTOTAL, CKMB, CKMBINDEX, TROPONINI in the last 168 hours. BNP: Invalid input(s): POCBNP CBG: No results for input(s): GLUCAP in the last 168 hours. D-Dimer No results for input(s): DDIMER in the last 72 hours. Hgb A1c No results for input(s): HGBA1C in the last 72 hours. Lipid Profile No results for input(s): CHOL, HDL, LDLCALC, TRIG, CHOLHDL, LDLDIRECT in the last 72 hours. Thyroid function studies No results for input(s): TSH,  T4TOTAL, T3FREE, THYROIDAB in the last 72 hours.  Invalid input(s): FREET3 Anemia work up No results for input(s): VITAMINB12, FOLATE, FERRITIN, TIBC, IRON, RETICCTPCT in the last 72 hours. Microbiology Recent Results (from the past 240 hour(s))  SARS Coronavirus 2 (CEPHEID - Performed in [place] hospital lab), Hosp Order     Status: None   Collection Time: 08/30/18  4:41 PM  Result Value Ref Range Status   SARS Coronavirus 2 NEGATIVE NEGATIVE Final    Comment: (NOTE) If result is NEGATIVE SARS-CoV-2 target nucleic acids are NOT DETECTED. The SARS-CoV-2 RNA is generally detectable in upper and lower  respiratory specimens during the acute phase of infection. The lowest  concentration of SARS-CoV-2 viral copies this assay can detect is 250  copies / mL. A negative result does not preclude SARS-CoV-2 infection  and  should not be used as the sole basis for treatment or other  patient management decisions.  A negative result may occur with  improper specimen collection / handling, submission of specimen other  than nasopharyngeal swab, presence of viral mutation(s) within the  areas targeted by this assay, and inadequate number of viral copies  (<250 copies / mL). A negative result must be combined with clinical  observations, patient history, and epidemiological information. If result is POSITIVE SARS-CoV-2 target nucleic acids are DETECTED. The SARS-CoV-2 RNA is generally detectable in upper and lower  respiratory specimens dur ing the acute phase of infection.  Positive  results are indicative of active infection with SARS-CoV-2.  Clinical  correlation with patient history and other diagnostic information is  necessary to determine patient infection status.  Positive results do  not rule out bacterial infection or co-infection with other viruses. If result is PRESUMPTIVE POSTIVE SARS-CoV-2 nucleic acids MAY BE PRESENT.   A presumptive positive result was obtained on the submitted specimen  and confirmed on repeat testing.  While 2019 novel coronavirus  (SARS-CoV-2) nucleic acids may be present in the submitted sample  additional confirmatory testing may be necessary for epidemiological  and / or clinical management purposes  to differentiate between  SARS-CoV-2 and other Sarbecovirus currently known to infect humans.  If clinically indicated additional testing with an alternate test  methodology [phone]) is advised. The SARS-CoV-2 RNA is generally  detectable in upper and lower respiratory sp ecimens during the acute  phase of infection. The expected result is Negative. Fact Sheet for Patients:  StrictlyIdeas.no Fact Sheet for Healthcare Providers: BankingDealers.co.za This test is not yet approved or cleared by the [place] FDA and has been  authorized for detection and/or diagnosis of SARS-CoV-2 by FDA under an Emergency Use Authorization (EUA).  This EUA will remain in effect (meaning this test can be used) for the duration of the COVID-19 declaration under Section 564(b)(1) of the Act, 21 U.S.C. section 360bbb-3(b)(1), unless the authorization is terminated or revoked sooner. Performed at [facility], [place] [address], [place], [place] [address]       Studies:  Mr Thoracic Spine Wo Contrast  Result Date: 09/01/2018 CLINICAL DATA:  43 year old male with back pain and anemia, suspicion of multiple myeloma. EXAM: MRI THORACIC SPINE WITHOUT CONTRAST TECHNIQUE: Multiplanar, multisequence MR imaging of the thoracic spine was performed. No intravenous contrast was administered. COMPARISON:  Skeletal survey 08/31/2018.  Lumbar MRI 08/30/2018. FINDINGS: Limited cervical spine imaging: Heterogeneous bone marrow signal particularly in the mid and lower cervical vertebrae. Thoracic spine segmentation: Normal on the comparison radiographs. And this numbering system appears concordant with that  on the recent lumbar MRI. Alignment: Mildly exaggerated thoracic kyphosis. No spondylolisthesis. Vertebrae: Diffusely abnormal bone marrow signal throughout the thoracic spine and visible ribs, most apparent on series 9. Pathologic compression fractures of T3 (mild), and T6 through T10 (mild-to-moderate). However, no confluent marrow edema at these levels. No retropulsion of bone. Cord: Spinal cord signal is within normal limits at all visualized levels. Mostly visible conus medullaris at T12-L1 appears normal. There is mild thoracic epidural lipomatosis which is maximal at the T5 and T6 levels. Paraspinal and other soft tissues: Small or trace layering pleural effusions. Otherwise negative visible thoracic viscera. Grossly negative visible upper abdominal viscera. No paraspinal soft tissue edema. Disc levels: No significant superimposed  degenerative changes. IMPRESSION: 1. Diffusely abnormal bone marrow signal highly suspicious for diffuse skeletal multiple myeloma. 2. Pathologic mild-to-moderate thoracic compression fractures T3 and T6 through T10, but no associated marrow edema or complicating features. 3. Normal thoracic spinal cord.  No spinal stenosis. 4. Trace layering pleural effusions. Electronically Signed   By: Genevie Ann M.D.   On: 09/01/2018 19:59    Assessment: 43 y.o. African-American male, without past medical history, presented with debilitating low back pain, and moderate anemia.  1. Normocytic hypo-productive anemia  2. Abnormal marrow signal on lumbar MRI  3. Hyperproteinemia  4. Severe low back pain, from thoracic and lumbar compression fracture, lumbar spine stenosis   Plan:  -this is highly suspicious for multiple myeloma.  He will have a bone marrow biopsy tomorrow morning on 09/04/2018. -He has multiple thoracic and lumbar vertebral body compression fractures, which explains his severe back pain.  Interventional radiology has been consulted for evaluation for possible vertebroplasty/kyphoplasty. -pain management: he is on iv dilaudid, and flexeril, I recommend low dose oxycodone 52m every 8 hours  -MM lab result pending, H/H stable, no need for blood transfusion for now -For constipation, I have added Colace 100 mg twice a day along with MiraLAX daily.  KMikey Bussing NP 09/03/2018    Addendum I have seen the patient, examined him. I agree with the assessment and and plan and have edited the notes.   His SPEP showed M-protein 5.7g/dl, IFE and light chain level is still pending. Bone marrow biopsy is scheduled for tomorrow morning, will be done by my NP LMendel Ryder   I spoke with IR Dr. HAnselm Pancoast pt is not a candidate for vertebroplasty or kyphoplasty at this time. Pt still has severe back pain/spasm and not able to walk. I encourage him to participate PT/OT in chair. I will refer him to orthopedic after  discharge.   YTruitt Merle 09/03/2018

## 2018-09-03 NOTE — Progress Notes (Signed)
PROGRESS NOTE    Edward Todd  TSV:[id number] DOB: 11-09-1975 DOA: 08/30/2018 PCP: Patient, No Pcp Per   Brief Narrative:  HPI per Dr. Jacki Cones on 08/30/2018 Edward Todd is a 43 y.o. male with no significant past medical history had an accident at work in March 2020 where the oxygen tank fell on his back at the lower lumbar upper sacral area he went to [employer] doctor and ordered muscle relaxant and pain pills but has not had any imaging done at that time.  Since March she has been out of work walking with a cane but today he was unable to get out of bed unable to walk even 1 step.  No urinary complaints no bowel complaints no nausea vomiting diarrhea fever chills abdominal pain chest pain shortness of breath cough headaches.  No complaints of hematemesis hematuria blood in the urine blood in stool.  He reports being treated for pneumonia over a month ago.  No complaints of pneumonia at this time or symptoms of pneumonia at this time.  No syncope but feels dizzy upon standing.  **Interim History  Patient was still complaining of some back pain and soreness and states that it is more difficult to ambulate now.  I discussed with him about concern for multiple myeloma. He is now agreeable to Bone Biopsy and this is to be done tomorrow. Bone scan showed multiple Thoracic and Lumbar Compression fractures and Thoracic Fractures were Age Indeterminate so an MRI was done of the Thoracic Spine and IR to review it to see if he is a candidate for Kyphoplasty/Vertebroplasty. IR to review today and patient was made NPO in case any intervention is warranted.   Assessment & Plan:   Active Problems:   Symptomatic anemia   Multiple myeloma (HCC)  Acute Anemia of unclear etiology at this time but likely from suspected Multiple Myeloma, stable -The Hgb in 2014 and 2017 was normal and 14.2 and 14.8 respectively  -Patient denies any hematemesis, hematochezia, hematuria.  -Anemia Panel  done and showed iron level of 57, U IBC 195, TIBC of 252, saturation ratios of 22, ferritin level 360, folate level 9.9, vitamin B12 level 250 -DIC panel done showed a d-dimer of 0.88, fibrinogen level of 365, PT of 15.7, INR 1.3, APTT of 28 -No evidence for Hemolytic or Nutritional Anemia per Oncology  -FOBT has been ordered and is pending to be done  -Checking SPEP and UPEP and will be checking 24-Hour Urine -MRI of the lumbar spine concerning for abnormal marrow signal question marrow replacement process.  -Patient's Hb/Hct went from 7.6/23.0 -> 7.6/22.7 -> 7.6/23.1 -> 7.5/23.2 -> 7.3/22.1 -Currentlyl holding off on blood transfusion at this time since he is asymptomatic from anemia standpoint. -Oncology Consulted for further evaluation and currently patient is being worked up -Continue to Monitor for S/Sx of Bleeding -Repeat CBC in AM    Suspected Multiple Myeloma -Total Protein was >12 and Albumin was low at 2.6; Now Total Protein is >12.0 and Albumin is 2.7 -Patient has Anemia and Severe Back Pain with Abnormal Marrow Signal on MRI of L Spine -Thoracic Spine MRI showed Diffusely abnormal bone marrow signal highly suspicious for diffuse skeletal multiple myeloma. Pathologic mild-to-moderate thoracic compression fractures T3 and T6 through T10, but no associated marrow edema or complicating features. Normal thoracic spinal cord.  No spinal stenosis. Trace layering pleural effusions. -SPEP ordered and Dr. Burr Medico evaluated and ordering IFE, Light Chain Level, and Beta-2 Microglobulin (5.5) and collect 24 Hour Urine for  UPEP/IFE -LDH was 245 -Oncology is checking IgG, IgA, IgM -Bone Survey done and this showed in the scapula bilaterally that can be seen with multiple myeloma. Also showed  age indeterminate T6, T8, T9, and T10 vertebral body compression fractures and Chronic L1, L2, and L4 Fractures -Patient's Total Protein ELP was 12.4, Total Globulin was 8.4, A/G Ratio was 0.5, Alpha-2-Globulin  was 1.1, Beta Globulin was 6.8, and M-Spike was 5.7% -Dr. Burr Medico of Oncology recommending Bone Marrow Bx and this will be done on Tuesday 09/04/2018   Acute Lower Back Pain  -Since oxygen tank fell on his back in March 2020.   -He is very tender on the lower lumbar upper sacral spine.   -Bone Survey Shows Multiple Thoracic Fractures of Indeterminate Age and Chronic L1, L2, and L4 pain -IR to Evaluate and Treat and he has No Acute Lumbar Fractures so they will not intervene on those for KP/VP but they recommend MRI of Thoracic Spine to further investigate the Age Indeterminate Thoracic Fractures  -C/w Cyclobenzaprine 5 mg po TIDprn Muscle Spasms -Given IV Morphine 4 mg Once and will continue IV Hydromorphone 0.5 mg q3hprn Severe Pain  -Added 5 mg of Oxycodone every 8 PRN for Moderate Pain -PT/OT to evaluate and Treat and recommending CIR and have consulted Inpatient Rehab  Thoracic Compression Fractures -As Above  -Bone survey to be done this showed in the scapula bilaterally that can be seen with multiple myeloma. Also showed  age indeterminate T6, T8, T9, and T10 vertebral body compression fractures and Chronic L1, L2, and L4 Fractures -IR to Evaluate and Treat and he has No Acute Lumbar Fractures so they will not intervene on those for KP/VP but they recommend MRI of Thoracic Spine to further investigate the Age Indeterminate Thoracic Fractures  -Thoracic Spine MRI showed Diffusely abnormal bone marrow signal highly suspicious for diffuse skeletal multiple myeloma. Pathologic mild-to-moderate thoracic compression fractures T3 and T6 through T10, but no associated marrow edema or complicating features. Normal thoracic spinal cord.  No spinal stenosis. Trace layering pleural effusions. -IR to review Throracic MRI to see if he is a candidate for Kyphoplasty/Vertebroplasty today   Hypertension  -Secondary to pain as patient has no Hx of HTN -Started on IV Hydralazine 5 mg IV q4hprn HBP -BP is now  improved and is 120/80  Obesity -Estimated body mass index is 35.53 kg/m as calculated from the following:   Height as of this encounter: 6' 3.51" (1.918 m).   Weight as of this encounter: 130.7 kg. -Weight Loss and Dietary Counseling given   Hyponatremia and Hypocholremia -Patient's Na+ now is 130 -Chloride went  101 ->97 and is now 9.8 -Given NS bolus of 1 Liter since admission -Continue to Monitor and Trend  -IVF now stopped  -Repeat CMP in AM   Hyperphosphatemia -Patient's Phos Level was 2.4 and improved to 4.3 -Continue to Monitor and Replete as Necessary -Repeat CMP in AM   Hyperglycemia in the setting of Pre-Diabetes -Patient's Blood Sugars ranging from 111-119 on CMP's -Check HbA1c in AM; Last HbA1c in 2009 was 6.1 -Continue to Monitor Blood Sugars Carefully -If necessary will need to place on Sensitive Novolog SSI AC  Leukopenia -Patient's WBC is trending down and went from 4.9 -> 4.0 -> 3.8 -> 3.4 -> 3.3 -Continue to Monitor and Likely from suspected MM -Repeat CBC in AM  Renal Insuffiencey -Mild. ? If he has CKD -Patient's BUN/Cr went from 20/1.21 -> 16/1.19 -> 14/1.17 -> 13/1.28 -> 15/1.29 -IVF  have now stopped  -Continue to Monitor and Avoid Nephrotoxic Medications if possible -Repeat CMP in AM   Constipation -Likely opiate induced -Started on docusate 100 mg p.o. twice daily along with MiraLAX 17 g p.o. daily  DVT prophylaxis: SCDs Code Status: FULL CODE Family Communication: No family present at bedside Disposition Plan: Pending further work-up for multiple myeloma improvement of symptoms. IR is to see the patient today   Consultants:   Medical Oncology  Interventional Radiology    Procedures: Bone Survey  Bone Marrow Biopsy to be done on 09/04/2018   Antimicrobials:  Anti-infectives (From admission, onward)   None     Subjective: Seen and examined bedside and still complaining of back pain.  Still has difficult time ambulating.   Wanting to know what I are thinking about possible vertebroplasty.  No other concerns or complaints at this time and understands that he will be going for bone biopsy tomorrow.  Objective: Vitals:   09/02/18 1346 09/02/18 1951 09/03/18 0500 09/03/18 0813  BP: (!) 132/93 139/87 121/84 120/80  Pulse: 96 94 95 92  Resp: '18 18 17 16  ' Temp: 98.4 F (36.9 C) 99.4 F (37.4 C) 98.9 F (37.2 C) 98.9 F (37.2 C)  TempSrc:  Oral Oral Oral  SpO2: 96% 98% 95%   Weight:    130.7 kg  Height:    6' 3.51" (1.918 m)    Intake/Output Summary (Last 24 hours) at 09/03/2018 1329 Last data filed at 09/03/2018 0500 Gross per 24 hour  Intake 675 ml  Output 2700 ml  Net -2025 ml   Filed Weights   09/03/18 0813  Weight: 130.7 kg    Examination: Physical Exam:  Constitutional: Well-nourished, well-developed obese African-American male who is currently in no acute distress but does seem a little bit uncomfortable and states that he has very difficult time ambulating. Eyes: Lids extract are normal.  Sclera anicteric ENMT: External ears nose appear normal.  Grossly normal hearing.  Mucous members are moist Neck: Appears supple no JVD Respiratory: Diminished at the bases but no appreciable wheezing, rales, rhonchi.  Patient not tachypneic or using any accessory muscles to breathe Cardiovascular: Regular rate and rhythm.  No appreciable murmurs, rubs, gallops.  No lower extremity edema noted Abdomen: Soft, nontender, distended secondary body habitus.  Bowel sounds present GU: Deferred Musculoskeletal: No contractures or cyanosis.  No joint deformities noted lower extremities Skin: Skin is warm and dry no appreciable rashes or lesions on to skin evaluation Neurologic: Cranial nerves II through XII grossly intact no appreciable focal deficits.  Romberg sign and cerebellar reflexes were not assessed Psychiatric: Has a pleasant mood and affect.  Intact judgment and insight.  Patient is awake, alert, oriented  x3  Data Reviewed: I have personally reviewed following labs and imaging studies  CBC: Recent Labs  Lab 08/30/18 1337 08/30/18 1733 08/31/18 0551 09/01/18 0602 09/02/18 0508 09/03/18 0517  WBC 4.9  --  4.0 3.8* 3.4* 3.3*  NEUTROABS 2.2  --   --  1.5* 1.4* 1.5*  HGB 7.6*  --  7.6* 7.6* 7.5* 7.3*  HCT 23.0*  --  22.7* 23.1* 23.2* 22.1*  MCV 91.3  --  90.4 92.8 90.6 90.2  PLT 206 215 185 208 196 226   Basic Metabolic Panel: Recent Labs  Lab 08/30/18 1337 08/31/18 0551 09/01/18 0602 09/02/18 0508 09/03/18 0517  NA 132* 132* 131* 132* 130*  K 4.0 3.5 3.6 3.6 3.6  CL 101 101 97* 97* 98  CO2 23  '22 24 24 23  ' GLUCOSE 114* 112* 111* 113* 119*  BUN '20 16 14 13 15  ' CREATININE 1.21 1.19 1.17 1.28* 1.29*  CALCIUM 9.7 9.6 9.4 9.5 9.7  MG  --   --  1.8 1.9 1.9  PHOS  --   --  2.4* 4.3 4.1   GFR: Estimated Creatinine Clearance: 107.2 mL/min (A) (by C-G formula based on SCr of 1.29 mg/dL (H)). Liver Function Tests: Recent Labs  Lab 08/30/18 1337 08/31/18 0551 09/01/18 0602 09/02/18 0508 09/03/18 0517  AST 20 17 14* 16 16  ALT '13 12 9 10 9  ' ALKPHOS 50 51 49 49 50  BILITOT 0.7 0.5 0.3 0.4 0.3  PROT >12.0* >12.0* 11.9* 11.7* >12.0*  ALBUMIN 2.9* 2.6* 2.5* 2.5* 2.7*   No results for input(s): LIPASE, AMYLASE in the last 168 hours. No results for input(s): AMMONIA in the last 168 hours. Coagulation Profile: Recent Labs  Lab 08/30/18 1733 08/31/18 0551  INR 1.3* 1.2   Cardiac Enzymes: No results for input(s): CKTOTAL, CKMB, CKMBINDEX, TROPONINI in the last 168 hours. BNP (last 3 results) No results for input(s): PROBNP in the last 8760 hours. HbA1C: No results for input(s): HGBA1C in the last 72 hours. CBG: No results for input(s): GLUCAP in the last 168 hours. Lipid Profile: No results for input(s): CHOL, HDL, LDLCALC, TRIG, CHOLHDL, LDLDIRECT in the last 72 hours. Thyroid Function Tests: No results for input(s): TSH, T4TOTAL, FREET4, T3FREE, THYROIDAB in the last  72 hours. Anemia Panel: No results for input(s): VITAMINB12, FOLATE, FERRITIN, TIBC, IRON, RETICCTPCT in the last 72 hours. Sepsis Labs: No results for input(s): PROCALCITON, LATICACIDVEN in the last 168 hours.  Recent Results (from the past 240 hour(s))  SARS Coronavirus 2 (CEPHEID - Performed in [place] hospital lab), Hosp Order     Status: None   Collection Time: 08/30/18  4:41 PM  Result Value Ref Range Status   SARS Coronavirus 2 NEGATIVE NEGATIVE Final    Comment: (NOTE) If result is NEGATIVE SARS-CoV-2 target nucleic acids are NOT DETECTED. The SARS-CoV-2 RNA is generally detectable in upper and lower  respiratory specimens during the acute phase of infection. The lowest  concentration of SARS-CoV-2 viral copies this assay can detect is 250  copies / mL. A negative result does not preclude SARS-CoV-2 infection  and should not be used as the sole basis for treatment or other  patient management decisions.  A negative result may occur with  improper specimen collection / handling, submission of specimen other  than nasopharyngeal swab, presence of viral mutation(s) within the  areas targeted by this assay, and inadequate number of viral copies  (<250 copies / mL). A negative result must be combined with clinical  observations, patient history, and epidemiological information. If result is POSITIVE SARS-CoV-2 target nucleic acids are DETECTED. The SARS-CoV-2 RNA is generally detectable in upper and lower  respiratory specimens dur ing the acute phase of infection.  Positive  results are indicative of active infection with SARS-CoV-2.  Clinical  correlation with patient history and other diagnostic information is  necessary to determine patient infection status.  Positive results do  not rule out bacterial infection or co-infection with other viruses. If result is PRESUMPTIVE POSTIVE SARS-CoV-2 nucleic acids MAY BE PRESENT.   A presumptive positive result was obtained on  the submitted specimen  and confirmed on repeat testing.  While 2019 novel coronavirus  (SARS-CoV-2) nucleic acids may be present in the submitted sample  additional confirmatory testing  may be necessary for epidemiological  and / or clinical management purposes  to differentiate between  SARS-CoV-2 and other Sarbecovirus currently known to infect humans.  If clinically indicated additional testing with an alternate test  methodology [phone]) is advised. The SARS-CoV-2 RNA is generally  detectable in upper and lower respiratory sp ecimens during the acute  phase of infection. The expected result is Negative. Fact Sheet for Patients:  StrictlyIdeas.no Fact Sheet for Healthcare Providers: BankingDealers.co.za This test is not yet approved or cleared by the [place] FDA and has been authorized for detection and/or diagnosis of SARS-CoV-2 by FDA under an Emergency Use Authorization (EUA).  This EUA will remain in effect (meaning this test can be used) for the duration of the COVID-19 declaration under Section 564(b)(1) of the Act, 21 U.S.C. section 360bbb-3(b)(1), unless the authorization is terminated or revoked sooner. Performed at [facility], [place] [address] [place], [place] [address]     Radiology Studies: Mr Thoracic Spine Wo Contrast  Result Date: 09/01/2018 CLINICAL DATA:  43 year old male with back pain and anemia, suspicion of multiple myeloma. EXAM: MRI THORACIC SPINE WITHOUT CONTRAST TECHNIQUE: Multiplanar, multisequence MR imaging of the thoracic spine was performed. No intravenous contrast was administered. COMPARISON:  Skeletal survey 08/31/2018.  Lumbar MRI 08/30/2018. FINDINGS: Limited cervical spine imaging: Heterogeneous bone marrow signal particularly in the mid and lower cervical vertebrae. Thoracic spine segmentation: Normal on the comparison radiographs. And this numbering system appears concordant  with that on the recent lumbar MRI. Alignment: Mildly exaggerated thoracic kyphosis. No spondylolisthesis. Vertebrae: Diffusely abnormal bone marrow signal throughout the thoracic spine and visible ribs, most apparent on series 9. Pathologic compression fractures of T3 (mild), and T6 through T10 (mild-to-moderate). However, no confluent marrow edema at these levels. No retropulsion of bone. Cord: Spinal cord signal is within normal limits at all visualized levels. Mostly visible conus medullaris at T12-L1 appears normal. There is mild thoracic epidural lipomatosis which is maximal at the T5 and T6 levels. Paraspinal and other soft tissues: Small or trace layering pleural effusions. Otherwise negative visible thoracic viscera. Grossly negative visible upper abdominal viscera. No paraspinal soft tissue edema. Disc levels: No significant superimposed degenerative changes. IMPRESSION: 1. Diffusely abnormal bone marrow signal highly suspicious for diffuse skeletal multiple myeloma. 2. Pathologic mild-to-moderate thoracic compression fractures T3 and T6 through T10, but no associated marrow edema or complicating features. 3. Normal thoracic spinal cord.  No spinal stenosis. 4. Trace layering pleural effusions. Electronically Signed   By: Genevie Ann M.D.   On: 09/01/2018 19:59    LOS: 4 days   Kerney Elbe, DO [facility] PAGER is on [place]  If 7PM-7AM, please contact night-coverage [email or web address] Password [facility] 09/03/2018, 1:29 PM

## 2018-09-04 ENCOUNTER — Telehealth: Payer: Self-pay | Admitting: Pharmacist

## 2018-09-04 ENCOUNTER — Other Ambulatory Visit: Payer: Self-pay | Admitting: Hematology

## 2018-09-04 DIAGNOSIS — C9000 Multiple myeloma not having achieved remission: Secondary | ICD-10-CM

## 2018-09-04 LAB — IMMUNOFIXATION ELECTROPHORESIS
IgA: 49 mg/dL — ABNORMAL LOW (ref 90–386)
IgG (Immunoglobin G), Serum: 6501 mg/dL — ABNORMAL HIGH (ref 603–1613)
IgM (Immunoglobulin M), Srm: 26 mg/dL (ref 20–172)
Total Protein ELP: 11.7 g/dL — ABNORMAL HIGH (ref 6.0–8.5)

## 2018-09-04 LAB — COMPREHENSIVE METABOLIC PANEL
ALT: 11 U/L (ref 0–44)
AST: 18 U/L (ref 15–41)
Albumin: 2.7 g/dL — ABNORMAL LOW (ref 3.5–5.0)
Alkaline Phosphatase: 51 U/L (ref 38–126)
Anion gap: 10 (ref 5–15)
BUN: 19 mg/dL (ref 6–20)
CO2: 24 mmol/L (ref 22–32)
Calcium: 10.3 mg/dL (ref 8.9–10.3)
Chloride: 98 mmol/L (ref 98–111)
Creatinine, Ser: 1.28 mg/dL — ABNORMAL HIGH (ref 0.61–1.24)
GFR calc Af Amer: >60 mL/min (ref >60)
GFR calc non Af Amer: >60 mL/min (ref >60)
Glucose, Bld: 108 mg/dL — ABNORMAL HIGH (ref 70–99)
Potassium: 3.6 mmol/L (ref 3.5–5.1)
Sodium: 132 mmol/L — ABNORMAL LOW (ref 135–145)
Total Bilirubin: 0.4 mg/dL (ref 0.3–1.2)
Total Protein: >12 g/dL — ABNORMAL HIGH (ref 6.5–8.1)

## 2018-09-04 LAB — CBC WITH DIFFERENTIAL/PLATELET
Abs Immature Granulocytes: 0.01 10*3/uL (ref 0.00–0.07)
Basophils Absolute: 0 10*3/uL (ref 0.0–0.1)
Basophils Relative: 0 %
Eosinophils Absolute: 0 10*3/uL (ref 0.0–0.5)
Eosinophils Relative: 0 %
HCT: 23.2 % — ABNORMAL LOW (ref 39.0–52.0)
Hemoglobin: 7.8 g/dL — ABNORMAL LOW (ref 13.0–17.0)
Immature Granulocytes: 0 %
Lymphocytes Relative: 54 %
Lymphs Abs: 2.3 10*3/uL (ref 0.7–4.0)
MCH: 30.4 pg (ref 26.0–34.0)
MCHC: 33.6 g/dL (ref 30.0–36.0)
MCV: 90.3 fL (ref 80.0–100.0)
Monocytes Absolute: 0.3 10*3/uL (ref 0.1–1.0)
Monocytes Relative: 7 %
Neutro Abs: 1.7 10*3/uL (ref 1.7–7.7)
Neutrophils Relative %: 39 %
Platelets: 208 10*3/uL (ref 150–400)
RBC: 2.57 MIL/uL — ABNORMAL LOW (ref 4.22–5.81)
RDW: 13.8 % (ref 11.5–15.5)
WBC: 4.3 10*3/uL (ref 4.0–10.5)
nRBC: 0 % (ref 0.0–0.2)

## 2018-09-04 LAB — UPEP/UIFE/LIGHT CHAINS/TP, 24-HR UR
% BETA, Urine: 12 %
ALPHA 1 URINE: 2.4 %
Albumin, U: 30.4 %
Alpha 2, Urine: 13.3 %
Free Kappa Lt Chains,Ur: 233.51 mg/L — ABNORMAL HIGH (ref 0.63–113.79)
Free Kappa/Lambda Ratio: 34.8 — ABNORMAL HIGH (ref 1.03–31.76)
Free Lambda Lt Chains,Ur: 6.71 mg/L (ref 0.47–11.77)
GAMMA GLOBULIN URINE: 41.9 %
M-SPIKE %, Urine: 22.5 % — ABNORMAL HIGH
M-Spike, Mg/24 Hr: 74 mg/24 hr — ABNORMAL HIGH
Total Protein, Urine-Ur/day: 329 mg/24 hr — ABNORMAL HIGH (ref 30–150)
Total Protein, Urine: 28.6 mg/dL
Total Volume: 1150

## 2018-09-04 LAB — PHOSPHORUS: Phosphorus: 4.5 mg/dL (ref 2.5–4.6)

## 2018-09-04 LAB — MAGNESIUM: Magnesium: 1.9 mg/dL (ref 1.7–2.4)

## 2018-09-04 LAB — HEMOGLOBIN A1C
Hgb A1c MFr Bld: 9.4 % — ABNORMAL HIGH (ref 4.8–5.6)
Mean Plasma Glucose: 223.08 mg/dL

## 2018-09-04 LAB — GLUCOSE, CAPILLARY
Glucose-Capillary: 99 mg/dL (ref 70–99)
Glucose-Capillary: 120 mg/dL — ABNORMAL HIGH (ref 70–99)

## 2018-09-04 MED ORDER — INSULIN ASPART 100 UNIT/ML ~~LOC~~ SOLN
0.0000 [IU] | Freq: Three times a day (TID) | SUBCUTANEOUS | Status: DC
  Administered 2018-09-05: 1 [IU] via SUBCUTANEOUS
  Administered 2018-09-05 – 2018-09-06 (×3): 2 [IU] via SUBCUTANEOUS
  Administered 2018-09-06: 1 [IU] via SUBCUTANEOUS

## 2018-09-04 NOTE — Procedures (Signed)
INDICATION: rule out multiple myeloma  Bone Marrow Biopsy and Aspiration Procedure Note   Informed consent was obtained and potential risks including bleeding, infection and pain were reviewed with the patient.  The patient's name, date of birth, identification, consent and allergies were verified prior to the start of procedure and time out was performed.  The left posterior iliac crest was chosen as the site of biopsy.  The skin was prepped with ChloraPrep.   9 cc of 2% lidocaine was used to provide local anaesthesia.   10 cc of bone marrow aspirate was obtained followed by 0.5cm biopsy. At which point patient requested to terminate procedure due to generalized discomfort from underlying pain issues. Pressure was applied to the biopsy site and bandage was placed over the biopsy site. Patient RN instructed to have patient lie on the back for 30 mins.   COMPLICATIONS: Despite receiving his normal pain medication prior to the procedure, the patient had generalized pain (from underlying issues) throughout the procedure which made it technically difficult to perform.   BLOOD LOSS: none Report given to bedside RN.  Patient was provided with post bone marrow biopsy instructions and nursing staff was instructed to call if there was any bleeding or worsening pain.  Specimens sent for flow cytometry, cytogenetics and additional studies.  Signed Scot Dock, NP  [phone] office [phone] cell

## 2018-09-04 NOTE — Progress Notes (Signed)
Edward Todd   DOB:January 30, 1976   FA#:[id number]   ION#:[id number]  Hem/Onc follow up note   Subjective: The patient reports ongoing back pain.  He is not a candidate for kyphoplasty/vertebroplasty.  He reports that he has been up walking around.  Bowels are now moving.  Bone marrow biopsy was performed earlier today and he tolerated this procedure well.  He has no other complaints this morning.  Objective:  Vitals:   09/04/18 0530 09/04/18 0930  BP: 116/87 124/88  Pulse: 96 (!) 105  Resp: 18 18  Temp: 98 F (36.7 C) 98.3 F (36.8 C)  SpO2: 97% 96%    Body mass index is 35.53 kg/m.  Intake/Output Summary (Last 24 hours) at 09/04/2018 1018 Last data filed at 09/04/2018 0754 Gross per 24 hour  Intake -  Output 1550 ml  Net -1550 ml     Sclerae unicteric  Oropharynx clear  No peripheral adenopathy  Lungs clear -- no rales or rhonchi  Heart regular rate and rhythm  Abdomen benign  Neuro nonfocal    CBG (last 3)  No results for input(s): GLUCAP in the last 72 hours.   Labs:  Urine Studies No results for input(s): UHGB, CRYS in the last 72 hours.  Invalid input(s): UACOL, UAPR, USPG, UPH, UTP, UGL, [place], UBIL, UNIT, UROB, [place], UEPI, UWBC, Junie Panning [place], [place], [name]  Basic Metabolic Panel: Recent Labs  Lab 08/31/18 0551 09/01/18 0602 09/02/18 0508 09/03/18 0517 09/04/18 0518  NA 132* 131* 132* 130* 132*  K 3.5 3.6 3.6 3.6 3.6  CL 101 97* 97* 98 98  CO2 '22 24 24 23 24  ' GLUCOSE 112* 111* 113* 119* 108*  BUN '16 14 13 15 19  ' CREATININE 1.19 1.17 1.28* 1.29* 1.28*  CALCIUM 9.6 9.4 9.5 9.7 10.3  MG  --  1.8 1.9 1.9 1.9  PHOS  --  2.4* 4.3 4.1 4.5   GFR Estimated Creatinine Clearance: 108 mL/min (A) (by C-G formula based on SCr of 1.28 mg/dL (H)). Liver Function Tests: Recent Labs  Lab 08/31/18 0551 09/01/18 0602 09/02/18 0508 09/03/18 0517 09/04/18 0518  AST 17 14* '16 16 18  ' ALT '12 9 10 9 11  ' ALKPHOS 51 49 49 50 51  BILITOT 0.5 0.3 0.4 0.3 0.4   PROT >12.0* 11.9* 11.7* >12.0* >12.0*  ALBUMIN 2.6* 2.5* 2.5* 2.7* 2.7*   No results for input(s): LIPASE, AMYLASE in the last 168 hours. No results for input(s): AMMONIA in the last 168 hours. Coagulation profile Recent Labs  Lab 08/30/18 1733 08/31/18 0551  INR 1.3* 1.2    CBC: Recent Labs  Lab 08/30/18 1337  08/31/18 0551 09/01/18 0602 09/02/18 0508 09/03/18 0517 09/04/18 0518  WBC 4.9  --  4.0 3.8* 3.4* 3.3* 4.3  NEUTROABS 2.2  --   --  1.5* 1.4* 1.5* 1.7  HGB 7.6*  --  7.6* 7.6* 7.5* 7.3* 7.8*  HCT 23.0*  --  22.7* 23.1* 23.2* 22.1* 23.2*  MCV 91.3  --  90.4 92.8 90.6 90.2 90.3  PLT 206   < > 185 208 196 204 208   < > = values in this interval not displayed.   Cardiac Enzymes: No results for input(s): CKTOTAL, CKMB, CKMBINDEX, TROPONINI in the last 168 hours. BNP: Invalid input(s): POCBNP CBG: No results for input(s): GLUCAP in the last 168 hours. D-Dimer No results for input(s): DDIMER in the last 72 hours. Hgb A1c Recent Labs    09/04/18 0518  HGBA1C 9.4*  Lipid Profile No results for input(s): CHOL, HDL, LDLCALC, TRIG, CHOLHDL, LDLDIRECT in the last 72 hours. Thyroid function studies No results for input(s): TSH, T4TOTAL, T3FREE, THYROIDAB in the last 72 hours.  Invalid input(s): FREET3 Anemia work up No results for input(s): VITAMINB12, FOLATE, FERRITIN, TIBC, IRON, RETICCTPCT in the last 72 hours. Microbiology Recent Results (from the past 240 hour(s))  SARS Coronavirus 2 (CEPHEID - Performed in [place] hospital lab), Hosp Order     Status: None   Collection Time: 08/30/18  4:41 PM  Result Value Ref Range Status   SARS Coronavirus 2 NEGATIVE NEGATIVE Final    Comment: (NOTE) If result is NEGATIVE SARS-CoV-2 target nucleic acids are NOT DETECTED. The SARS-CoV-2 RNA is generally detectable in upper and lower  respiratory specimens during the acute phase of infection. The lowest  concentration of SARS-CoV-2 viral copies this assay can  detect is 250  copies / mL. A negative result does not preclude SARS-CoV-2 infection  and should not be used as the sole basis for treatment or other  patient management decisions.  A negative result may occur with  improper specimen collection / handling, submission of specimen other  than nasopharyngeal swab, presence of viral mutation(s) within the  areas targeted by this assay, and inadequate number of viral copies  (<250 copies / mL). A negative result must be combined with clinical  observations, patient history, and epidemiological information. If result is POSITIVE SARS-CoV-2 target nucleic acids are DETECTED. The SARS-CoV-2 RNA is generally detectable in upper and lower  respiratory specimens dur ing the acute phase of infection.  Positive  results are indicative of active infection with SARS-CoV-2.  Clinical  correlation with patient history and other diagnostic information is  necessary to determine patient infection status.  Positive results do  not rule out bacterial infection or co-infection with other viruses. If result is PRESUMPTIVE POSTIVE SARS-CoV-2 nucleic acids MAY BE PRESENT.   A presumptive positive result was obtained on the submitted specimen  and confirmed on repeat testing.  While 2019 novel coronavirus  (SARS-CoV-2) nucleic acids may be present in the submitted sample  additional confirmatory testing may be necessary for epidemiological  and / or clinical management purposes  to differentiate between  SARS-CoV-2 and other Sarbecovirus currently known to infect humans.  If clinically indicated additional testing with an alternate test  methodology [id number] is advised. The SARS-CoV-2 RNA is generally  detectable in upper and lower respiratory sp ecimens during the acute  phase of infection. The expected result is Negative. Fact Sheet for Patients:  StrictlyIdeas.no Fact Sheet for Healthcare  Providers: BankingDealers.co.za This test is not yet approved or cleared by the [place] FDA and has been authorized for detection and/or diagnosis of SARS-CoV-2 by FDA under an Emergency Use Authorization (EUA).  This EUA will remain in effect (meaning this test can be used) for the duration of the COVID-19 declaration under Section 564(b)(1) of the Act, 21 U.S.C. section 360bbb-3(b)(1), unless the authorization is terminated or revoked sooner. Performed at [facility], [place] [address] [place],  [address]       Studies:  No results found.  Assessment: 43 y.o. African-American male, without past medical history, presented with debilitating low back pain, and moderate anemia.  1. Normocytic hypo-productive anemia  2. Abnormal marrow signal on lumbar MRI  3. Hyperproteinemia  4. Severe low back pain, from thoracic and lumbar compression fracture, lumbar spine stenosis   Plan:  -this is highly suspicious for  multiple myeloma.  Bone marrow biopsy was performed this morning.  Will await results. -He has multiple thoracic and lumbar vertebral body compression fractures, which explains his severe back pain.  Interventional radiology has been consulted for evaluation for possible vertebroplasty/kyphoplasty and this procedure was not recommended. -pain management: he is on iv dilaudid, and flexeril, I recommend low dose oxycodone 5m every 8 hours  -His SPEP showed M-protein 5.7g/dl, kappa, lambda light chain ratio was elevated at 103.81 and kappa free light chain was elevated at 664.4.  Quantitative immunoglobulins are pending.  Hemoglobin remained stable and there is no need for transfusion. -For constipation, continue Colace and MiraLAX. -Recommend PT/OT.  Will refer to orthopedics upon discharge.  KMikey Bussing NP 09/04/2018

## 2018-09-04 NOTE — Progress Notes (Signed)
PROGRESS NOTE    Edward Todd  ZWC:[id number] DOB: April 17, 1976 DOA: 08/30/2018 PCP: Patient, No Pcp Per   Brief Narrative:  HPI per Dr. Jacki Cones on 08/30/2018 Edward Todd is a 43 y.o. male with no significant past medical history had an accident at work in March 2020 where the oxygen tank fell on his back at the lower lumbar upper sacral area he went to [employer] doctor and ordered muscle relaxant and pain pills but has not had any imaging done at that time.  Since March she has been out of work walking with a cane but today he was unable to get out of bed unable to walk even 1 step.  No urinary complaints no bowel complaints no nausea vomiting diarrhea fever chills abdominal pain chest pain shortness of breath cough headaches.  No complaints of hematemesis hematuria blood in the urine blood in stool.  He reports being treated for pneumonia over a month ago.  No complaints of pneumonia at this time or symptoms of pneumonia at this time.  No syncope but feels dizzy upon standing.  **Interim History  Patient was still complaining of some back pain and soreness and states that it is more difficult to ambulate now.  I discussed with him about concern for multiple myeloma. He is now agreeable to Bone Biopsy and this is to be done tomorrow. Bone scan showed multiple Thoracic and Lumbar Compression fractures and Thoracic Fractures were Age Indeterminate so an MRI was done of the Thoracic Spine and IR to review it to see if he is a candidate for Kyphoplasty/Vertebroplasty. IR reviewed his images and deemed the patient not a candidate for kyphoplasty or vertebroplasty.  Patient underwent a bone marrow biopsy today and likely has multiple myeloma but results will be available tomorrow and Dr. Burr Medico starting Chemotherapy with Bortezomib, Lenalidomide, and Dexamethasone.  Assessment & Plan:   Active Problems:   Symptomatic anemia   Multiple myeloma (HCC)  Acute Anemia of unclear  etiology at this time but likely from suspected Multiple Myeloma, stable -The Hgb in 2014 and 2017 was normal and 14.2 and 14.8 respectively  -Patient denies any hematemesis, hematochezia, hematuria.  -Anemia Panel done and showed iron level of 57, U IBC 195, TIBC of 252, saturation ratios of 22, ferritin level 360, folate level 9.9, vitamin B12 level 250 -DIC panel done showed a d-dimer of 0.88, fibrinogen level of 365, PT of 15.7, INR 1.3, APTT of 28 -No evidence for Hemolytic or Nutritional Anemia per Oncology  -FOBT has been ordered and is pending to be done  -Checking SPEP and UPEP and will be checking 24-Hour Urine -MRI of the lumbar spine concerning for abnormal marrow signal question marrow replacement process.  -Patient's Hb/Hct went from 7.6/23.0 -> 7.6/22.7 -> 7.6/23.1 -> 7.5/23.2 -> 7.3/22.1 -> 7.8/23.2 -Currentlyl holding off on blood transfusion at this time since he is asymptomatic from anemia standpoint. -Oncology Consulted for further evaluation and currently patient is being worked up -Continue to Monitor for S/Sx of Bleeding -Repeat CBC in AM    Suspected Multiple Myeloma -Total Protein was >12 and Albumin was low at 2.6; Now Total Protein is >12.0 and Albumin is 2.7 -Patient has Anemia and Severe Back Pain with Abnormal Marrow Signal on MRI of L Spine -Thoracic Spine MRI showed Diffusely abnormal bone marrow signal highly suspicious for diffuse skeletal multiple myeloma. Pathologic mild-to-moderate thoracic compression fractures T3 and T6 through T10, but no associated marrow edema or complicating features. Normal thoracic spinal cord.  No spinal stenosis. Trace layering pleural effusions. -SPEP ordered and Dr. Burr Medico evaluated and ordering IFE, Light Chain Level, and Beta-2 Microglobulin (5.5) and collect 24 Hour Urine for UPEP/IFE -LDH was 245 -Oncology is checking IgG, IgA, IgM;  -Patient's Kappa Free Light Chain was 664.4, Lambda Free Light hain was 6.4, and Kappa, Lambda  Light Chain Ratio was 103.81 -Bone Survey done and this showed in the scapula bilaterally that can be seen with multiple myeloma. Also showed  age indeterminate T6, T8, T9, and T10 vertebral body compression fractures and Chronic L1, L2, and L4 Fractures -Patient's Total Protein ELP was 12.4, Total Globulin was 8.4, A/G Ratio was 0.5, Alpha-2-Globulin was 1.1, Beta Globulin was 6.8, and M-Spike was 5.7% -Dr. Burr Medico of Oncology recommending Bone Marrow Bx this was done today with results hopefully tomorrow -Dr. Burr Medico starting Chemotherapy with Bortezomib, Lenalidomide, and Dexamethasone once results are confirmed  Acute Lower Back Pain  -Since oxygen tank fell on his back in March 2020.   -He is very tender on the lower lumbar upper sacral spine.   -Bone Survey Shows Multiple Thoracic Fractures of Indeterminate Age and Chronic L1, L2, and L4 pain -IR to Evaluate and Treat and he has No Acute Lumbar Fractures so they will not intervene on those for KP/VP but they recommend MRI of Thoracic Spine to further investigate the Age Indeterminate Thoracic Fractures  -C/w Cyclobenzaprine 5 mg po TIDprn Muscle Spasms -Given IV Morphine 4 mg Once and will continue IV Hydromorphone 0.5 mg q3hprn Severe Pain  -Added 5 mg of Oxycodone every 8 PRN for Moderate Pain -PT/OT to evaluate and Treat and recommending CIR and have consulted Inpatient Rehab -Unfortunately the patient is not a candidate for kyphoplasty or vertebroplasty.  I discussed the case with Dr. Lorin Mercy of orthopedics who recommends no TLSO brace currently as these fractures are not new.  He recommends continuing with therapy to help and recommend outpatient Orthopedics follow-up  Thoracic Compression Fractures -As Above  -Bone survey to be done this showed in the scapula bilaterally that can be seen with multiple myeloma. Also showed  age indeterminate T6, T8, T9, and T10 vertebral body compression fractures and Chronic L1, L2, and L4 Fractures -IR to  Evaluate and Treat and he has No Acute Lumbar Fractures so they will not intervene on those for KP/VP but they recommend MRI of Thoracic Spine to further investigate the Age Indeterminate Thoracic Fractures  -Thoracic Spine MRI showed Diffusely abnormal bone marrow signal highly suspicious for diffuse skeletal multiple myeloma. Pathologic mild-to-moderate thoracic compression fractures T3 and T6 through T10, but no associated marrow edema or complicating features. Normal thoracic spinal cord.  No spinal stenosis. Trace layering pleural effusions. -IR to review Throracic MRI to see if he is a candidate for Kyphoplasty/Vertebroplasty and unfortunately was not -I discussed the case with Dr. Lorin Mercy of orthopedics who recommends no TLSO brace currently as these fractures are not new.  He recommends continuing with therapy to help and recommend outpatient Orthopedics follow-up   Hypertension  -Secondary to pain as patient has no Hx of HTN -Started on IV Hydralazine 5 mg IV q4hprn HBP -BP is now improved and is 124/88  Obesity -Estimated body mass index is 35.53 kg/m as calculated from the following:   Height as of this encounter: 6' 3.51" (1.918 m).   Weight as of this encounter: 130.7 kg. -Weight Loss and Dietary Counseling given   Hyponatremia and Hypocholremia -Patient's Na+ now is 132 -Chloride went  101 ->97  and is now 21 -Given NS bolus of 1 Liter since admission -Continue to Monitor and Trend  -IVF now stopped  -Repeat CMP in AM   Hyperphosphatemia -Patient's Phos Level was 2.4 and improved to 4.5 -Continue to Monitor and Replete as Necessary -Repeat CMP in AM   Hyperglycemia in the Setting of Uncontrolled Diabetes -Previously was Pre-Diabetic  -Patient's Blood Sugars ranging from 111-119 on CMP's -Checked HbA1c and was 9.4 but ? If Accurate; Last HbA1c in 2009 was 6.1 -Continue to Monitor Blood Sugars Carefully -Will need diabetes education coordinator follow-up -If necessary  will need to place on Sensitive Novolog SSI AC  Leukopenia -Patient's WBC is trending down and went from 4.9 -> 4.0 -> 3.8 -> 3.4 -> 3.3 -> 4.3 -Continue to Monitor and Likely from suspected MM -Repeat CBC in AM  Renal Insuffiencey -Mild. ? If he has CKD -Patient's BUN/Cr went from 20/1.21 -> 16/1.19 -> 14/1.17 -> 13/1.28 -> 15/1.29 -> 19/1.28 -IVF have now stopped  -Continue to Monitor and Avoid Nephrotoxic Medications if possible -Repeat CMP in AM   Constipation, improved -Likely opiate induced -Started on docusate 100 mg p.o. twice daily along with MiraLAX 17 g p.o. daily  DVT prophylaxis: SCDs Code Status: FULL CODE Family Communication: No family present at bedside Disposition Plan: Pending further work-up for multiple myeloma improvement of symptoms; Awaiting Bone Marrow Biopsy Results and will need CIR   Consultants:   Medical Oncology  Interventional Radiology   Discussed Case with Orthopedic Surgery Dr. Lorin Mercy   Procedures: Bone Survey  Bone Marrow Biopsy to be done on 09/04/2018   Antimicrobials:  Anti-infectives (From admission, onward)   None     Subjective: Seen and examined bedside and states that his back pain is doing okay for now but still hurting.  No chest pain, lightheadedness or dizziness.  No nausea or vomiting.  Agreeable to CIR if a candidate.  States that he had bowel movement yesterday.  No other concerns complaints at this time.  Objective: Vitals:   09/03/18 1839 09/03/18 2103 09/04/18 0530 09/04/18 0930  BP: 126/88 (!) 139/92 116/87 124/88  Pulse: 93 92 96 (!) 105  Resp: _0 Temp: 98.2 F (36.8 C) 98.2 F (36.8 C) 98 F (36.7 C) 98.3 F (36.8 C)  TempSrc: Oral Oral Oral Oral  SpO2: 98% 97% 97% 96%  Weight:      Height:        Intake/Output Summary (Last 24 hours) at 09/04/2018 1241 Last data filed at 09/04/2018 0754 Gross per 24 hour  Intake -  Output 1550 ml  Net -1550 ml   Filed Weights   09/03/18 0813  Weight:  130.7 kg   Examination: Physical Exam:  Constitutional: Well-nourished, well-developed obese African-American currently no acute distress but does appear little bit uncomfortable laying in bed due to back pain Eyes: Lids and conjunctive are normal.  Sclera anicteric ENMT: External ears nose appear normal.  Grossly normal hearing.  Mucous members are moist Neck: Appears supple with no JVD Respiratory: Diminished auscultation bilaterally no appreciable wheezing, rales, rhonchi.  Patient not tachypneic or using any accessory muscles to breathe Cardiovascular: Regular rate and rhythm.  No appreciable murmurs, rubs, gallops. Abdomen: Soft, non-trichomonacide second body habitus.  Bowel sounds present GU: Deferred Musculoskeletal: No contractures or cyanosis.  No joint deformities in the upper lower extremities noted Skin: Skin is warm and dry no appreciable rashes or lesions limited skin evaluation Neurologic: Cranial nerves II through XII grossly  intact no appreciable focal deficits.  Romberg sign cerebellar reflexes were not assessed Psychiatric: Normal mood and affect.  Intact judgment and insight.  Patient is awake, alert, oriented x3.  Data Reviewed: I have personally reviewed following labs and imaging studies  CBC: Recent Labs  Lab 08/30/18 1337  08/31/18 0551 09/01/18 0602 09/02/18 0508 09/03/18 0517 09/04/18 0518  WBC 4.9  --  4.0 3.8* 3.4* 3.3* 4.3  NEUTROABS 2.2  --   --  1.5* 1.4* 1.5* 1.7  HGB 7.6*  --  7.6* 7.6* 7.5* 7.3* 7.8*  HCT 23.0*  --  22.7* 23.1* 23.2* 22.1* 23.2*  MCV 91.3  --  90.4 92.8 90.6 90.2 90.3  PLT 206   < > 185 208 196 204 208   < > = values in this interval not displayed.   Basic Metabolic Panel: Recent Labs  Lab 08/31/18 0551 09/01/18 0602 09/02/18 0508 09/03/18 0517 09/04/18 0518  NA 132* 131* 132* 130* 132*  K 3.5 3.6 3.6 3.6 3.6  CL 101 97* 97* 98 98  CO2 _0 GLUCOSE 112* 111* 113* 119* 108*  BUN _1 CREATININE 1.19 1.17 1.28* 1.29* 1.28*  CALCIUM 9.6 9.4 9.5 9.7 10.3  MG  --  1.8 1.9 1.9 1.9  PHOS  --  2.4* 4.3 4.1 4.5   GFR: Estimated Creatinine Clearance: 108 mL/min (A) (by C-G formula based on SCr of 1.28 mg/dL (H)). Liver Function Tests: Recent Labs  Lab 08/31/18 0551 09/01/18 0602 09/02/18 0508 09/03/18 0517 09/04/18 0518  AST 17 14* _2 ALT _3 ALKPHOS 51 49 49 50 51  BILITOT 0.5 0.3 0.4 0.3 0.4  PROT >12.0* 11.9* 11.7* >12.0* >12.0*  ALBUMIN 2.6* 2.5* 2.5* 2.7* 2.7*   No results for input(s): LIPASE, AMYLASE in the last 168 hours. No results for input(s): AMMONIA in the last 168 hours. Coagulation Profile: Recent Labs  Lab 08/30/18 1733 08/31/18 0551  INR 1.3* 1.2   Cardiac Enzymes: No results for input(s): CKTOTAL, CKMB, CKMBINDEX, TROPONINI in the last 168 hours. BNP (last 3 results) No results for input(s): PROBNP in the last 8760 hours. HbA1C: Recent Labs    09/04/18 0518  HGBA1C 9.4*   CBG: No results for input(s): GLUCAP in the last 168 hours. Lipid Profile: No results for input(s): CHOL, HDL, LDLCALC, TRIG, CHOLHDL, LDLDIRECT in the last 72 hours. Thyroid Function Tests: No results for input(s): TSH, T4TOTAL, FREET4, T3FREE, THYROIDAB in the last 72 hours. Anemia Panel: No results for input(s): VITAMINB12, FOLATE, FERRITIN, TIBC, IRON, RETICCTPCT in the last 72 hours. Sepsis Labs: No results for input(s): PROCALCITON, LATICACIDVEN in the last 168 hours.  Recent Results (from the past 240 hour(s))  SARS Coronavirus 2 (CEPHEID - Performed in [place] hospital lab), Hosp Order     Status: None   Collection Time: 08/30/18  4:41 PM  Result Value Ref Range Status   SARS Coronavirus 2 NEGATIVE NEGATIVE Final    Comment: (NOTE) If result is NEGATIVE SARS-CoV-2 target nucleic acids are NOT DETECTED. The SARS-CoV-2 RNA is generally detectable in upper and lower  respiratory specimens during the acute phase of infection. The  lowest  concentration of SARS-CoV-2 viral copies this assay can detect is 250  copies / mL. A negative result does not preclude SARS-CoV-2 infection  and should not be used as the sole basis for treatment or other  patient management decisions.  A negative result may occur with  improper specimen collection / handling, submission of specimen other  than nasopharyngeal swab, presence of viral mutation(s) within the  areas targeted by this assay, and inadequate number of viral copies  (<250 copies / mL). A negative result must be combined with clinical  observations, patient history, and epidemiological information. If result is POSITIVE SARS-CoV-2 target nucleic acids are DETECTED. The SARS-CoV-2 RNA is generally detectable in upper and lower  respiratory specimens dur ing the acute phase of infection.  Positive  results are indicative of active infection with SARS-CoV-2.  Clinical  correlation with patient history and other diagnostic information is  necessary to determine patient infection status.  Positive results do  not rule out bacterial infection or co-infection with other viruses. If result is PRESUMPTIVE POSTIVE SARS-CoV-2 nucleic acids MAY BE PRESENT.   A presumptive positive result was obtained on the submitted specimen  and confirmed on repeat testing.  While 2019 novel coronavirus  (SARS-CoV-2) nucleic acids may be present in the submitted sample  additional confirmatory testing may be necessary for epidemiological  and / or clinical management purposes  to differentiate between  SARS-CoV-2 and other Sarbecovirus currently known to infect humans.  If clinically indicated additional testing with an alternate test  methodology [phone]) is advised. The SARS-CoV-2 RNA is generally  detectable in upper and lower respiratory sp ecimens during the acute  phase of infection. The expected result is Negative. Fact Sheet for Patients:  StrictlyIdeas.no  Fact Sheet for Healthcare Providers: BankingDealers.co.za This test is not yet approved or cleared by the [place] FDA and has been authorized for detection and/or diagnosis of SARS-CoV-2 by FDA under an Emergency Use Authorization (EUA).  This EUA will remain in effect (meaning this test can be used) for the duration of the COVID-19 declaration under Section 564(b)(1) of the Act, 21 U.S.C. section 360bbb-3(b)(1), unless the authorization is terminated or revoked sooner. Performed at [facility], [place] [address] [place], [place] [address]     Radiology Studies: No results found.  LOS: 5 days   Kerney Elbe, DO [facility] PAGER is on [place]  If 7PM-7AM, please contact night-coverage [email or web address] Password [email or web address] 09/04/2018, 12:41 PM

## 2018-09-04 NOTE — Progress Notes (Signed)
Inpatient Rehab Admissions:  Inpatient Rehab Consult received.  I discussed with patient over the phone for rehabilitation assessment and to discuss goals and expectations of an inpatient rehab admission.  Pt is interested in CIR admission.  Wife verified 24/7 support at home.  Will need insurance authorization for possible admission when medical workup complete and treatment plan solidified.  I will continue to follow.   Signed: Shann Medal, PT, DPT Admissions Coordinator [phone] 09/04/18  10:19 AM

## 2018-09-04 NOTE — Progress Notes (Signed)
START ON PATHWAY REGIMEN - Multiple Myeloma and Other Plasma Cell Dyscrasias     A cycle is every 21 days:     Bortezomib      Lenalidomide      Dexamethasone   **Always confirm dose/schedule in your pharmacy ordering system**  Patient Characteristics: Newly Diagnosed, Transplant Eligible, Unknown or Awaiting Test Results R-ISS Staging: III Disease Classification: Newly Diagnosed Is Patient Eligible for Transplant<= Transplant Eligible Risk Status: Unknown Intent of Therapy: Curative Intent, Discussed with Patient

## 2018-09-04 NOTE — Telephone Encounter (Signed)
Oral Oncology Pharmacist Encounter  Received new prescription for Revlimid (lenolidomide) for the treatment of newly diagnosed multiple myeloma not having achieve remission in conjunction with Velcade (bortezimib) and dexamethasone, planned duration 4-6 induction cycles then reassessment.  Patient is currently admitted and treatment is planned to be initiated as soon as possible  Revlimid is planned to be administered at 28m by mouth once daily for 14 days on, 7 days off, and repeated  Labs from 09/04/2018 assessed, OK for treatment initiation. SCr=1.28, est CrCl ~ >100 mL/min (wt=130.7 kg, age=43)  Current medication list in Epic reviewed, no significant DDIs with Revlimid identified.  Velcade and ascorbic acid: category D interaction: additional vitamin C supplementation is possibly with associated with decreased bortezomib-induced multiple myeloma cell death and decreased bortezomib-induced inhibition of multiple myeloma cell growth. Discontinuation of additional supplementation will be discussed with patient.  Patient will need medication for VZV prophylaxis, will discuss initiation of acyclovir with MD, noted patient with valacyclovir prn on medication list Patient will need medication for thromboprophylaxis, will discuss aspirin 855monce daily initiation with MD  Prescription for Revlimid will be e-scribed to appropriate specialty pharmacy for dispensing once insurance authorization is approved. Revlimid is not available for dispensing at the [facility] it is a limited distribution medication.  Revlimimd also requires [employer] portal authorization number, collaborative practice RN has been notified.  Oral Oncology Clinic will continue to follow for insurance authorization, copayment issues, initial counseling and start date.  JeJohny DrillingPharmD, BCPS, BCOP  09/04/2018 2:52 PM Oral Oncology Clinic [phone]

## 2018-09-04 NOTE — Progress Notes (Signed)
PT Cancellation Note  Patient Details Name: Jammie Clink MRN: [id number] DOB: [birth year]   Cancelled Treatment:     Biopsy this morning.  Unable to attempt this afternoon due to schedule.  Will attempt tomorrow.   Rica Koyanagi  PTA Acute  Rehabilitation Services Pager      [phone] Office      [phone]

## 2018-09-05 ENCOUNTER — Telehealth: Payer: Self-pay

## 2018-09-05 DIAGNOSIS — K5900 Constipation, unspecified: Secondary | ICD-10-CM | POA: Diagnosis present

## 2018-09-05 DIAGNOSIS — E119 Type 2 diabetes mellitus without complications: Secondary | ICD-10-CM

## 2018-09-05 DIAGNOSIS — D638 Anemia in other chronic diseases classified elsewhere: Secondary | ICD-10-CM | POA: Diagnosis present

## 2018-09-05 DIAGNOSIS — I10 Essential (primary) hypertension: Secondary | ICD-10-CM | POA: Diagnosis present

## 2018-09-05 LAB — GLUCOSE, CAPILLARY
Glucose-Capillary: 143 mg/dL — ABNORMAL HIGH (ref 70–99)
Glucose-Capillary: 142 mg/dL — ABNORMAL HIGH (ref 70–99)
Glucose-Capillary: 186 mg/dL — ABNORMAL HIGH (ref 70–99)
Glucose-Capillary: 208 mg/dL — ABNORMAL HIGH (ref 70–99)

## 2018-09-05 LAB — CBC WITH DIFFERENTIAL/PLATELET
Abs Immature Granulocytes: 0.02 10*3/uL (ref 0.00–0.07)
Basophils Absolute: 0 10*3/uL (ref 0.0–0.1)
Basophils Relative: 0 %
Eosinophils Absolute: 0 10*3/uL (ref 0.0–0.5)
Eosinophils Relative: 1 %
HCT: 24.3 % — ABNORMAL LOW (ref 39.0–52.0)
Hemoglobin: 8 g/dL — ABNORMAL LOW (ref 13.0–17.0)
Immature Granulocytes: 1 %
Lymphocytes Relative: 53 %
Lymphs Abs: 2.3 10*3/uL (ref 0.7–4.0)
MCH: 29.7 pg (ref 26.0–34.0)
MCHC: 32.9 g/dL (ref 30.0–36.0)
MCV: 90.3 fL (ref 80.0–100.0)
Monocytes Absolute: 0.4 10*3/uL (ref 0.1–1.0)
Monocytes Relative: 8 %
Neutro Abs: 1.6 10*3/uL — ABNORMAL LOW (ref 1.7–7.7)
Neutrophils Relative %: 37 %
Platelets: 212 10*3/uL (ref 150–400)
RBC: 2.69 MIL/uL — ABNORMAL LOW (ref 4.22–5.81)
RDW: 13.8 % (ref 11.5–15.5)
WBC: 4.4 10*3/uL (ref 4.0–10.5)
nRBC: 0 % (ref 0.0–0.2)

## 2018-09-05 LAB — COMPREHENSIVE METABOLIC PANEL
ALT: 12 U/L (ref 0–44)
AST: 18 U/L (ref 15–41)
Albumin: 2.8 g/dL — ABNORMAL LOW (ref 3.5–5.0)
Alkaline Phosphatase: 56 U/L (ref 38–126)
Anion gap: 12 (ref 5–15)
BUN: 25 mg/dL — ABNORMAL HIGH (ref 6–20)
CO2: 24 mmol/L (ref 22–32)
Calcium: 10.2 mg/dL (ref 8.9–10.3)
Chloride: 96 mmol/L — ABNORMAL LOW (ref 98–111)
Creatinine, Ser: 1.42 mg/dL — ABNORMAL HIGH (ref 0.61–1.24)
GFR calc Af Amer: >60 mL/min (ref >60)
GFR calc non Af Amer: 60 mL/min — ABNORMAL LOW (ref >60)
Glucose, Bld: 124 mg/dL — ABNORMAL HIGH (ref 70–99)
Potassium: 3.8 mmol/L (ref 3.5–5.1)
Sodium: 132 mmol/L — ABNORMAL LOW (ref 135–145)
Total Bilirubin: 0.4 mg/dL (ref 0.3–1.2)
Total Protein: >12 g/dL — ABNORMAL HIGH (ref 6.5–8.1)

## 2018-09-05 LAB — IGG, IGA, IGM
IgA: 53 mg/dL — ABNORMAL LOW (ref 90–386)
IgG (Immunoglobin G), Serum: 6909 mg/dL — ABNORMAL HIGH (ref 603–1613)
IgM (Immunoglobulin M), Srm: 26 mg/dL (ref 20–172)

## 2018-09-05 LAB — PHOSPHORUS: Phosphorus: 3.7 mg/dL (ref 2.5–4.6)

## 2018-09-05 LAB — MAGNESIUM: Magnesium: 2 mg/dL (ref 1.7–2.4)

## 2018-09-05 MED ORDER — LIVING WELL WITH DIABETES BOOK
Freq: Once | Status: AC
  Administered 2018-09-05: 11:00:00
  Filled 2018-09-05: qty 1

## 2018-09-05 MED ORDER — PROCHLORPERAZINE MALEATE 10 MG PO TABS
10.0000 mg | ORAL_TABLET | Freq: Once | ORAL | Status: AC
  Administered 2018-09-05: 10 mg via ORAL
  Filled 2018-09-05: qty 1

## 2018-09-05 MED ORDER — ACYCLOVIR 400 MG PO TABS
400.0000 mg | ORAL_TABLET | Freq: Two times a day (BID) | ORAL | Status: DC
  Administered 2018-09-05 – 2018-09-07 (×5): 400 mg via ORAL
  Filled 2018-09-05 (×5): qty 1

## 2018-09-05 MED ORDER — ASPIRIN 81 MG PO CHEW
81.0000 mg | CHEWABLE_TABLET | Freq: Every day | ORAL | Status: DC
  Administered 2018-09-05 – 2018-09-07 (×3): 81 mg via ORAL
  Filled 2018-09-05 (×3): qty 1

## 2018-09-05 MED ORDER — BORTEZOMIB CHEMO SQ INJECTION 3.5 MG (2.5MG/ML)
1.3000 mg/m2 | Freq: Once | INTRAMUSCULAR | Status: AC
  Administered 2018-09-05: 3.5 mg via SUBCUTANEOUS
  Filled 2018-09-05: qty 1.4

## 2018-09-05 MED ORDER — PANTOPRAZOLE SODIUM 40 MG PO TBEC
40.0000 mg | DELAYED_RELEASE_TABLET | Freq: Every day | ORAL | Status: DC
  Administered 2018-09-05 – 2018-09-07 (×3): 40 mg via ORAL
  Filled 2018-09-05 (×3): qty 1

## 2018-09-05 MED ORDER — ZOLEDRONIC ACID 4 MG/5ML IV CONC
4.0000 mg | Freq: Once | INTRAVENOUS | Status: AC
  Administered 2018-09-05: 4 mg via INTRAVENOUS
  Filled 2018-09-05: qty 5

## 2018-09-05 MED ORDER — LENALIDOMIDE 25 MG PO CAPS: 25.0000 mg | ORAL_CAPSULE | Freq: Every day | ORAL | 0 refills | Status: DC

## 2018-09-05 MED ORDER — DEXAMETHASONE 4 MG PO TABS
40.0000 mg | ORAL_TABLET | Freq: Once | ORAL | Status: AC
  Administered 2018-09-05: 40 mg via ORAL
  Filled 2018-09-05: qty 10

## 2018-09-05 MED ORDER — ENOXAPARIN SODIUM 40 MG/0.4ML ~~LOC~~ SOLN
40.0000 mg | SUBCUTANEOUS | Status: DC
  Administered 2018-09-05 – 2018-09-06 (×2): 40 mg via SUBCUTANEOUS
  Filled 2018-09-05 (×2): qty 0.4

## 2018-09-05 NOTE — Progress Notes (Signed)
Inpatient Diabetes Program Recommendations  AACE/ADA: New Consensus Statement on Inpatient Glycemic Control (2015)  Target Ranges:  Prepandial:   less than 140 mg/dL      Peak postprandial:   less than 180 mg/dL (1-2 hours)      Critically ill patients:  140 - 180 mg/dL   Lab Results  Component Value Date   GLUCAP 143 (H) 09/05/2018   HGBA1C 9.4 (H) 09/04/2018    Review of Glycemic Control  Diabetes history: DM2 Outpatient Diabetes medications: None (Per EMR in 2016, on Janumet 50/1000 mg bid) Current orders for Inpatient glycemic control: Novolog 0-9 units tidwc  Inpatient Diabetes Program Recommendations:     Agree with orders. Ordered living well with diabetes book Please add CHO mod med to heart healthy diet  Plan to see pt this afternoon regarding his diabetes and HgbA1C of 9.4%. Doubtful pt will need insulin at discharge.  Continue to follow closely.  Thank you. Lorenda Peck, RD, LDN, CDE Inpatient Diabetes Coordinator [phone]

## 2018-09-05 NOTE — Progress Notes (Signed)
Independently checked chemotherapy with Lottie Dawson, RN based on patients BSA and normal dosing of Velcade.

## 2018-09-05 NOTE — Telephone Encounter (Signed)
Oral Oncology Patient Advocate Encounter  Prior Authorization for Revlimid has been approved.    PA# [id number] Effective dates: 09/05/18 through 09/05/19  Oral Oncology Clinic will continue to follow.   Must use [facility].  [facility] Patient [place] Phone [phone] Fax [phone] 09/05/2018    10:14 AM

## 2018-09-05 NOTE — Progress Notes (Signed)
Inpatient Diabetes Program Recommendations  AACE/ADA: New Consensus Statement on Inpatient Glycemic Control (2015)  Target Ranges:  Prepandial:   less than 140 mg/dL      Peak postprandial:   less than 180 mg/dL (1-2 hours)      Critically ill patients:  140 - 180 mg/dL   Lab Results  Component Value Date   GLUCAP 186 (H) 09/05/2018   HGBA1C 9.4 (H) 09/04/2018    Review of Glycemic Control  Spoke with pt regarding new diagnosis of DM. Discussed HgbA1C of 9.4% and goal of 7-8%. Discussed basic pathophysiology of DM2, importance of home care, monitoring, eating healthy diet and exercising. Discussed portion control and eliminating sweetened beverages such as sweet tea, sodas and gatorade. Pt is having first chemo for multiple myeloma today. Planning on going to CIR at [facility]. Will follow-up when d/ced to Rehab. Seems to be very motivated to control his blood sugars. Discussed Living Well with Diabetes book.  Thank you. Lorenda Peck, RD, LDN, CDE Inpatient Diabetes Coordinator [phone]

## 2018-09-05 NOTE — Telephone Encounter (Signed)
Oral Oncology Patient Advocate Encounter  Received notification from [facility] that prior authorization for Revlimid is required.  PA submitted on CoverMyMeds Key [id number] Status is pending  Oral Oncology Clinic will continue to follow.  [place] Patient [place] Phone [phone] Fax [phone] 09/05/2018    9:56 AM

## 2018-09-05 NOTE — Progress Notes (Addendum)
Edward Todd   DOB:[birth year]   LG#:[id number]   YCX#:[id number]  Hem/Onc follow up note   Subjective: The patient reports that his back pain is overall controlled.  He is trying to get up and move a little bit more.  Bowels are moving.  He has no other complaints this morning.  Bone marrow biopsy results are pending.  We are hopeful to have a preliminary report later today.  Objective:  Vitals:   09/04/18 2154 09/05/18 0628  BP: 130/83 126/87  Pulse: 89 89  Resp: 18 18  Temp: 98.1 F (36.7 C) 98.2 F (36.8 C)  SpO2: 97% 98%    Body mass index is 35.53 kg/m.  Intake/Output Summary (Last 24 hours) at 09/05/2018 0854 Last data filed at 09/05/2018 0630 Gross per 24 hour  Intake -  Output 1350 ml  Net -1350 ml     Sclerae unicteric  Oropharynx clear  No peripheral adenopathy  Lungs clear -- no rales or rhonchi  Heart regular rate and rhythm  Abdomen benign  Neuro nonfocal    CBG (last 3)  Recent Labs    09/04/18 1327 09/04/18 1707 09/05/18 0820  GLUCAP 99 120* 143*     Labs:  Urine Studies No results for input(s): UHGB, CRYS in the last 72 hours.  Invalid input(s): UACOL, UAPR, USPG, UPH, UTP, UGL, [place], UBIL, UNIT, UROB, [place], UEPI, UWBC, [place], [place], [place], [place], [name]  Basic Metabolic Panel: Recent Labs  Lab 09/01/18 0602 09/02/18 0508 09/03/18 0517 09/04/18 0518 09/05/18 0727  NA 131* 132* 130* 132* 132*  K 3.6 3.6 3.6 3.6 3.8  CL 97* 97* 98 98 96*  CO2 '24 24 23 24 24  ' GLUCOSE 111* 113* 119* 108* 124*  BUN '14 13 15 19 ' 25*  CREATININE 1.17 1.28* 1.29* 1.28* 1.42*  CALCIUM 9.4 9.5 9.7 10.3 10.2  MG 1.8 1.9 1.9 1.9 2.0  PHOS 2.4* 4.3 4.1 4.5 3.7   GFR Estimated Creatinine Clearance: 97.4 mL/min (A) (by C-G formula based on SCr of 1.42 mg/dL (H)). Liver Function Tests: Recent Labs  Lab 09/01/18 0602 09/02/18 0508 09/03/18 0517 09/04/18 0518 09/05/18 0727  AST 14* '16 16 18 18  ' ALT '9 10 9 11 12  ' ALKPHOS 49 49 50 51 56  BILITOT 0.3 0.4 0.3  0.4 0.4  PROT 11.9* 11.7* >12.0* >12.0* >12.0*  ALBUMIN 2.5* 2.5* 2.7* 2.7* 2.8*   No results for input(s): LIPASE, AMYLASE in the last 168 hours. No results for input(s): AMMONIA in the last 168 hours. Coagulation profile Recent Labs  Lab 08/30/18 1733 08/31/18 0551  INR 1.3* 1.2    CBC: Recent Labs  Lab 09/01/18 0602 09/02/18 0508 09/03/18 0517 09/04/18 0518 09/05/18 0727  WBC 3.8* 3.4* 3.3* 4.3 4.4  NEUTROABS 1.5* 1.4* 1.5* 1.7 1.6*  HGB 7.6* 7.5* 7.3* 7.8* 8.0*  HCT 23.1* 23.2* 22.1* 23.2* 24.3*  MCV 92.8 90.6 90.2 90.3 90.3  PLT 208 196 204 208 212   Cardiac Enzymes: No results for input(s): CKTOTAL, CKMB, CKMBINDEX, TROPONINI in the last 168 hours. BNP: Invalid input(s): POCBNP CBG: Recent Labs  Lab 09/04/18 1327 09/04/18 1707 09/05/18 0820  GLUCAP 99 120* 143*   D-Dimer No results for input(s): DDIMER in the last 72 hours. Hgb A1c Recent Labs    09/04/18 0518  HGBA1C 9.4*   Lipid Profile No results for input(s): CHOL, HDL, LDLCALC, TRIG, CHOLHDL, LDLDIRECT in the last 72 hours. Thyroid function studies No results for input(s): TSH, T4TOTAL, T3FREE, THYROIDAB in  the last 72 hours.  Invalid input(s): FREET3 Anemia work up No results for input(s): VITAMINB12, FOLATE, FERRITIN, TIBC, IRON, RETICCTPCT in the last 72 hours. Microbiology Recent Results (from the past 240 hour(s))  SARS Coronavirus 2 (CEPHEID - Performed in [place] hospital lab), Hosp Order     Status: None   Collection Time: 08/30/18  4:41 PM  Result Value Ref Range Status   SARS Coronavirus 2 NEGATIVE NEGATIVE Final    Comment: (NOTE) If result is NEGATIVE SARS-CoV-2 target nucleic acids are NOT DETECTED. The SARS-CoV-2 RNA is generally detectable in upper and lower  respiratory specimens during the acute phase of infection. The lowest  concentration of SARS-CoV-2 viral copies this assay can detect is 250  copies / mL. A negative result does not preclude SARS-CoV-2 infection   and should not be used as the sole basis for treatment or other  patient management decisions.  A negative result may occur with  improper specimen collection / handling, submission of specimen other  than nasopharyngeal swab, presence of viral mutation(s) within the  areas targeted by this assay, and inadequate number of viral copies  (<250 copies / mL). A negative result must be combined with clinical  observations, patient history, and epidemiological information. If result is POSITIVE SARS-CoV-2 target nucleic acids are DETECTED. The SARS-CoV-2 RNA is generally detectable in upper and lower  respiratory specimens dur ing the acute phase of infection.  Positive  results are indicative of active infection with SARS-CoV-2.  Clinical  correlation with patient history and other diagnostic information is  necessary to determine patient infection status.  Positive results do  not rule out bacterial infection or co-infection with other viruses. If result is PRESUMPTIVE POSTIVE SARS-CoV-2 nucleic acids MAY BE PRESENT.   A presumptive positive result was obtained on the submitted specimen  and confirmed on repeat testing.  While 2019 novel coronavirus  (SARS-CoV-2) nucleic acids may be present in the submitted sample  additional confirmatory testing may be necessary for epidemiological  and / or clinical management purposes  to differentiate between  SARS-CoV-2 and other Sarbecovirus currently known to infect humans.  If clinically indicated additional testing with an alternate test  methodology [phone]) is advised. The SARS-CoV-2 RNA is generally  detectable in upper and lower respiratory sp ecimens during the acute  phase of infection. The expected result is Negative. Fact Sheet for Patients:  StrictlyIdeas.no Fact Sheet for Healthcare Providers: BankingDealers.co.za This test is not yet approved or cleared by the [place] FDA  and has been authorized for detection and/or diagnosis of SARS-CoV-2 by FDA under an Emergency Use Authorization (EUA).  This EUA will remain in effect (meaning this test can be used) for the duration of the COVID-19 declaration under Section 564(b)(1) of the Act, 21 U.S.C. section 360bbb-3(b)(1), unless the authorization is terminated or revoked sooner. Performed at [facility], [place] [address], [place], [place] [address]       Studies:  No results found.  Assessment: 43 y.o. African-American male, without past medical history, presented with debilitating low back pain, and moderate anemia.  1. Multiple myeloma, stage III 2. Moderate anemia  3. Hyperproteinemia  4. Severe low back pain, from thoracic and lumbar compression fracture, lumbar spine stenosis   Plan:  -this is highly suspicious for multiple myeloma.  Bone marrow biopsy was performed this morning.  We are hoping to have a preliminary report later today.  If it does show multiple myeloma, we will plan to treat  him with Velcade, dexamethasone, and Revlimid.  Further details on the plan to follow. -He has multiple thoracic and lumbar vertebral body compression fractures, which explains his severe back pain.  Interventional radiology has been consulted for evaluation for possible vertebroplasty/kyphoplasty and this procedure was not recommended. -pain management: Pain is currently well controlled.  Continue pain medications as needed. -His SPEP showed M-protein 5.7g/dl, kappa, lambda light chain ratio was elevated at 103.81 and kappa free light chain was elevated at 664.4.  IgG elevated at 6909. hemoglobin remained stable and there is no need for transfusion. -For constipation, continue Colace and MiraLAX. -Plan is for the patient to go to inpatient rehab at [facility].   Mikey Bussing, NP 09/05/2018    Addendum  I have seen the patient, examined him. I agree with the assessment and and plan and have  edited the notes.   I discussed with pathologist Dr. Melina Copa today, his bone marrow aspirates were consistent multiple myeloma, with 80-90 myeloma cells in the bone marrow, core biopsy will be reviewed later today, and cytogenetics and FISH will be ordered. I discussed the above with pt, and discussed treatment options, including induction chemo, possible BM transplant and maintenance therapy. WE discuss that his disease is potential curable but require prolonged therapy due to high risk of recurrence.  I recommend weekly Velcade and dexamethasone, Revlimid 25 mg daily on day 1-14 every 21 days. The benefit and potential side effects, expecially risk of infection, worsening anemia, risk of bleeding, neuropathy, diabetes, thrombosis, secondary cancer, fatigue, etc were discussed with pt and his wife (over phone) in details, chemo consent obtained from pt and will start velcade and dexa today, Revlimid prescription was sent out today, he will start as soon as he receives the medication. We discussed disease monitoring after treatment.   He will start aspirin, prophylactic dose lovenox to reduce his risk of thrombosis, will add PPI due to high dose steroid, will also watch his blood glucose closely.    I also recommend Zometa infusion monthly, side effects especially bone pain and jaw necrosis discussed with him, he agrees to proceed, will start today. I will start him on oral calcium and vitD next week.   He will likely go to [facility] for inpt rehab, will schedule his second dose Velcade at [facility] next week. I will f/u while he is in house.  I spent a total of 60 mins to coordinate his care, and >50% on face to face counseling.   Truitt Merle  09/05/2018

## 2018-09-05 NOTE — Telephone Encounter (Signed)
Oral Oncology Pharmacist Encounter  Insurance authorization for Revlimid has been obtained. [employer] authorization number for Revlimid dispensing.  Prescription for Revlimid 25mg  capsules, take 1 capsule by mouth once daily for 14 days on, 7 days off, repeat every 21 days, quantity #14, refills=0, has been e-scribed to [facility] specialty pharmacy in [place], [place].  Contact at [facility] has been notified to look out for prescription.  Supporting information including insurance information, demographic information, medication list, and insurance authorization has been faxed to dispensing pharmacy.   I will reach out to the patient to discuss initial counseling and site of dispensing once I confirm pharmacy is able to process patient's claim.  [name], PharmD, BCPS, BCOP  09/05/2018 3:43 PM Oral Oncology Clinic [phone]

## 2018-09-05 NOTE — Progress Notes (Signed)
Occupational Therapy Treatment Patient Details Name: Roddrick Sharron MRN: [id number] DOB: 05-01-1975 Today's Date: 09/05/2018    History of present illness 43 yo male admitted with symptomatic anemia, weakness, falls at home. Workup for possible multiple myeloma-oncology following. Hx of age indeterminate thoracic and lumbar compression fractures.    OT comments  Pt unable to sit up fully today but did movement he did mostly on his own, min A and extra time. Multiple people came during session (RN, NT, MD).  Returned to supine when Oncologist came in.  Pt got muscle relaxant during tx for spasms.  Pt is very motivated, but limited by pain  Follow Up Recommendations  CIR    Equipment Recommendations  (defer to next venue)    Recommendations for Other Services      Precautions / Restrictions Precautions Precautions: Fall;Back Precaution Comments: back precautions/logroll Restrictions Weight Bearing Restrictions: No       Mobility Bed Mobility Overal bed mobility: Needs Assistance Bed Mobility: Rolling;Sidelying to Sit;Sit to Sidelying           General bed mobility comments: patient with increased time and effort during all transitional movements to prevent pain. Offered physical assist to patient with patient declining, also attempted to use gait belt to guide LE off edge of bed. Patient only able to come to half sitting at EOB with heavy use of UE for assist. Pain limiting movement. Oncology MD arriving at end of session  Transfers                 General transfer comment: unable    Balance Overall balance assessment: History of Falls;Needs assistance Sitting-balance support: Bilateral upper extremity supported;Feet supported Sitting balance-Leahy Scale: Poor                                     ADL either performed or assessed with clinical judgement   ADL       Grooming: Set up;Bed level   Upper Body Bathing: Set up;Bed level   Lower  Body Bathing: Maximal assistance;Bed level   Upper Body Dressing : Minimal assistance;Bed level                     General ADL Comments: performed adl at bed level prior to sitting up.  reinforced back precautions. Pt would benefit from AE     Vision       Perception     Praxis      Cognition Arousal/Alertness: Awake/alert Behavior During Therapy: WFL for tasks assessed/performed Overall Cognitive Status: Within Functional Limits for tasks assessed                                          Exercises     Shoulder Instructions       General Comments      Pertinent Vitals/ Pain       Pain Assessment: Faces Faces Pain Scale: Hurts whole lot Pain Location: back and L hip Pain Descriptors / Indicators: Grimacing Pain Intervention(s): Limited activity within patient's tolerance;Monitored during session;Repositioned;Patient requesting pain meds-RN notified;RN gave pain meds during session  Home Living  Prior Functioning/Environment              Frequency  Min 2X/week        Progress Toward Goals  OT Goals(current goals can now be found in the care plan section)  Progress towards OT goals: Progressing toward goals  Acute Rehab OT Goals Patient Stated Goal: decrease pain  Plan      Co-evaluation    PT/OT/SLP Co-Evaluation/Treatment: Yes Reason for Co-Treatment: Complexity of the patient's impairments (multi-system involvement);For patient/therapist safety;To address functional/ADL transfers PT goals addressed during session: Mobility/safety with mobility OT goals addressed during session: ADL's and self-care      AM-PAC OT "6 Clicks" Daily Activity     Outcome Measure   Help from another person eating meals?: None Help from another person taking care of personal grooming?: A Little Help from another person toileting, which includes using toliet, bedpan, or urinal?:  Total Help from another person bathing (including washing, rinsing, drying)?: A Lot Help from another person to put on and taking off regular upper body clothing?: A Little Help from another person to put on and taking off regular lower body clothing?: Total 6 Click Score: 14    End of Session    OT Visit Diagnosis: History of falling (Z91.81);Repeated falls (R29.6);Other abnormalities of gait and mobility (R26.89);Muscle weakness (generalized) (M62.81);Unsteadiness on feet (R26.81);Pain   Activity Tolerance Patient tolerated treatment well   Patient Left in bed;with call bell/phone within reach   Nurse Communication          Time: 8286-7519 OT Time Calculation (min): 36 min  Charges: OT General Charges $OT Visit: 1 Visit OT Treatments $Self Care/Home Management : 8-22 mins  Lesle Chris, OTR/L Acute Rehabilitation Services [phone] [place] pager [phone] office 09/05/2018   [place] 09/05/2018, 1:33 PM

## 2018-09-05 NOTE — Progress Notes (Signed)
Physical Therapy Treatment Patient Details Name: Edward Todd MRN: [id number] DOB: [birth year] Today's Date: 09/05/2018    History of Present Illness 43 yo male admitted with symptomatic anemia, weakness, falls at home. Workup for possible multiple myeloma-oncology following. Hx of age indeterminate thoracic and lumbar compression fractures.     PT Comments    Patient received in bed and agreeable to participate with PT/OT. Patient educated on log rolling techniques and justification with good verbal understanding. Increased time and effort from patient for all transitional movements. Patient only able to come to half sitting at EOB with heavy use of B UE to offload pressure to prevent back pain. Declined physical assist from PT/OT for transfer - likely due to fear of increased pain. Attempted to use gait belt to help aide LE towards EOB, however patient unable. Primary limiting factor currently pain. Will continue to recommend CIR level therapies at discharge. PT to continue to follow acutely and progress as patient tolerates.     Follow Up Recommendations  CIR     Equipment Recommendations  None recommended by PT    Recommendations for Other Services Rehab consult     Precautions / Restrictions Precautions Precautions: Fall;Back Precaution Comments: back precautions/logroll Restrictions Weight Bearing Restrictions: No    Mobility  Bed Mobility Overal bed mobility: Needs Assistance Bed Mobility: Rolling;Sidelying to Sit;Sit to Sidelying           General bed mobility comments: patient with increased time and effort during all transitional movements to prevent pain. Offered physical assist to patient with patient declining, also attempted to use gait belt to guide LE off edge of bed. Patient only able to come to half sitting at EOB with heavy use of UE for assist. Pain limiting movement. Oncology MD arriving at end of session. Required use of bed rails as well as HOB  elevated.   Transfers                 General transfer comment: unable  Ambulation/Gait                 Stairs             Wheelchair Mobility    Modified Rankin (Stroke Patients Only)       Balance Overall balance assessment: History of Falls;Needs assistance Sitting-balance support: Bilateral upper extremity supported;Feet supported Sitting balance-Leahy Scale: Poor                                      Cognition Arousal/Alertness: Awake/alert Behavior During Therapy: WFL for tasks assessed/performed Overall Cognitive Status: Within Functional Limits for tasks assessed                                        Exercises      General Comments        Pertinent Vitals/Pain Pain Assessment: Faces Faces Pain Scale: Hurts whole lot Pain Location: back and L hip Pain Descriptors / Indicators: Grimacing Pain Intervention(s): Limited activity within patient's tolerance;Monitored during session;Repositioned;Patient requesting pain meds-RN notified;RN gave pain meds during session    Home Living                      Prior Function            PT Goals (current  goals can now be found in the care plan section) Acute Rehab PT Goals Patient Stated Goal: decerase pain and keep moving to gain ind PT Goal Formulation: With patient Time For Goal Achievement: 09/15/18 Potential to Achieve Goals: Good Progress towards PT goals: Progressing toward goals    Frequency    Min 3X/week      PT Plan Current plan remains appropriate    Co-evaluation PT/OT/SLP Co-Evaluation/Treatment: Yes Reason for Co-Treatment: Complexity of the patient's impairments (multi-system involvement);For patient/therapist safety;To address functional/ADL transfers PT goals addressed during session: Mobility/safety with mobility OT goals addressed during session: ADL's and self-care      AM-PAC PT "6 Clicks" Mobility   Outcome  Measure  Help needed turning from your back to your side while in a flat bed without using bedrails?: A Lot Help needed moving from lying on your back to sitting on the side of a flat bed without using bedrails?: A Lot Help needed moving to and from a bed to a chair (including a wheelchair)?: Total Help needed standing up from a chair using your arms (e.g., wheelchair or bedside chair)?: Total Help needed to walk in hospital room?: Total Help needed climbing 3-5 steps with a railing? : Total 6 Click Score: 8    End of Session   Activity Tolerance: Patient limited by pain Patient left: in bed;with call bell/phone within reach;with nursing/sitter in room Nurse Communication: Mobility status PT Visit Diagnosis: Pain;Difficulty in walking, not elsewhere classified (R26.2);History of falling (Z91.81);Repeated falls (R29.6);Other abnormalities of gait and mobility (R26.89) Pain - part of body: Hip(back)     Time: 6333-5456 PT Time Calculation (min) (ACUTE ONLY): 36 min  Charges:  $Therapeutic Activity: 8-22 mins                     Lanney Gins, PT, DPT Supplemental Physical Therapist 09/05/18 1:12 PM Pager: [phone] Office: [phone]

## 2018-09-05 NOTE — Progress Notes (Signed)
PROGRESS NOTE    Edward Todd  NKN:[id number] DOB: Nov 26, 1975 DOA: 08/30/2018 PCP: Patient, No Pcp Per    Brief Narrative: (Start on day 1 of progress note - keep it brief and live) HPI per Dr. Jacki Cones on 08/30/2018 Edward Todd a 43 y.o.malewithno significant pastmedical historyhad an accident at work in March 2020 where the oxygen tank fell on his back at the lower lumbar upper sacral area he went to [employer] doctor and ordered muscle relaxant and pain pills but has not had any imaging done at that time. Since March she has been out of work walking with a cane but today he was unable to get out of bed unable to walk even 1 step. No urinary complaints no bowel complaints no nausea vomiting diarrhea fever chills abdominal pain chest pain shortness of breath cough headaches. No complaints of hematemesis hematuria blood in the urine blood in stool. He reports being treated for pneumonia over a month ago. No complaints of pneumonia at this time or symptoms of pneumonia at this time. No syncope but feels dizzy upon standing.  **Interim History  Patient was still complaining of some back pain and soreness and states that it is more difficult to ambulate now.  I discussed with him about concern for multiple myeloma. He is now agreeable to Bone Biopsy and this is to be done tomorrow. Bone scan showed multiple Thoracic and Lumbar Compression fractures and Thoracic Fractures were Age Indeterminate so an MRI was done of the Thoracic Spine and IR to review it to see if he is a candidate for Kyphoplasty/Vertebroplasty. IR reviewed his images and deemed the patient not a candidate for kyphoplasty or vertebroplasty.  Patient underwent a bone marrow biopsy today and likely has multiple myeloma but results will be available tomorrow and Dr. Burr Medico starting Chemotherapy with Bortezomib, Lenalidomide, and Dexamethasone   Assessment & Plan:   Principal Problem:   Multiple  myeloma ([place]) Active Problems:   Symptomatic anemia   HTN (hypertension)   Type 2 diabetes mellitus without complication (HCC)   Hypophosphatemia   Constipation   Anemia of chronic disease  1 newly diagnosed multiple myeloma Patient had presented with back pain, noted to be anemic with hemoglobin dropping as low as 7.3 with no signs or symptoms of bleeding.  MRI of the L-spine showed abnormal marrow signal suspicious for diffuse skeletal multiple myeloma.  Patient also noted to have some pathologic thoracic fractures of T3 and T6-T10 but no associated marrow edema or complicating features.  Bone survey done showed lesions in the scapula bilaterally that can be seen with multiple myeloma.  IR was consulted for vertebroplasty/kyphoplasty however this was not recommended per them.  Patient's pain currently well controlled.  SPEP done showed a M protein of 5.7 g/dL, kappa lambda light chain ratio elevated at 103.81.  Kappa free light chain was elevated at 664.4.  IgG noted to be elevated at 10/02/2007.  Bone marrow biopsy was done with formal results pending however per oncology, bone marrow aspirate is consistent with multiple myeloma with 80-90 myeloma cells in the bone marrow.  Core biopsy pending with cytogenetics and FISH which has been ordered.  Patient being followed by oncology and patient to be started on weekly Velcade and dexamethasone, Revlimid 25 mg daily on day 1-14 every 21 days.  Continue current pain management.  PT/OT.  Oncology following and appreciate their input and recommendations.  2.  Anemia of chronic disease  likely secondary to problem #1.  Patient  with no overt bleeding.  Transfusion threshold hemoglobin less than 7.  Follow H&H.  3.  Thoracic compression fractures Likely secondary to problem #1.  Thoracic spine MRI showed diffusely abnormal bone marrow signal highly suspicious of diffuse skeletal multiple myeloma.  Pathologic mild to moderate thoracic compression fractures T3  and 6 through T10 but no associated marrow edema complicating features.  IR reviewed MRI and was felt patient was not a candidate for kyphoplasty or vertebroplasty.  Case was discussed by Dr. Shelton Silvas with orthopedics Dr. Lorin Mercy who recommends no TLSO brace as fractures noted not to be new and recommending outpatient follow-up with orthopedics.  PT/OT.  4.  Hypertension Blood pressure stable.  IV hydralazine as needed.  5.  Hypophosphatemia Repleted.  6.  Obesity  7.  Type 2 diabetes mellitus Hemoglobin A1c noted to be 9.4.  Hemoglobin A1c in 2009 was 6.1.  Continue sliding scale insulin.  May need to be started on oral hypoglycemic agents on discharge.  Diabetes coordinator following and appreciate input and recommendations.  8.  Constipation Likely opiate induced.  Improved.  Continue current bowel regimen of MiraLAX and Colace.   DVT prophylaxis: Lovenox Code Status: Full Family Communication: Updated patient.  No family at bedside. Disposition Plan: Hopefully to CIR once bed approved hopefully the next 24 to 48 hours.   Consultants:   Medical Oncology  Interventional Radiology   Discussed Case with Orthopedic Surgery Dr. Lorin Mercy  Procedures:   Skeletal bone survey 08/31/2018  MRI L-spine 08/30/2018  MRI T-spine 09/01/2018  Bone marrow biopsy 09/04/2018  Antimicrobials:   None.   Subjective: Patient sitting up around bed with physical therapy.  Patient feels back pain overall improving however not at baseline.  Denies any chest pain or shortness of breath.  No bleeding noted.  Objective: Vitals:   09/04/18 1850 09/04/18 2154 09/05/18 0628 09/05/18 1458  BP: 124/81 130/83 126/87 120/77  Pulse: 96 89 89 (!) 102  Resp: '18 18 18 18  ' Temp: 98.1 F (36.7 C) 98.1 F (36.7 C) 98.2 F (36.8 C) 97.6 F (36.4 C)  TempSrc: Oral Oral Oral Oral  SpO2: 98% 97% 98% 100%  Weight:      Height:        Intake/Output Summary (Last 24 hours) at 09/05/2018 1902 Last data filed at  09/05/2018 1848 Gross per 24 hour  Intake -  Output 2700 ml  Net -2700 ml   Filed Weights   09/03/18 0813  Weight: 130.7 kg    Examination:  General exam: Appears calm and comfortable  Respiratory system: Clear to auscultation. Respiratory effort normal. Cardiovascular system: S1 & S2 heard, RRR. No JVD, murmurs, rubs, gallops or clicks. No pedal edema. Gastrointestinal system: Abdomen is nondistended, soft and nontender. No organomegaly or masses felt. Normal bowel sounds heard. Central nervous system: Alert and oriented. No focal neurological deficits. Extremities: Symmetric 5 x 5 power. Skin: No rashes, lesions or ulcers Psychiatry: Judgement and insight appear normal. Mood & affect appropriate.     Data Reviewed: I have personally reviewed following labs and imaging studies  CBC: Recent Labs  Lab 09/01/18 0602 09/02/18 0508 09/03/18 0517 09/04/18 0518 09/05/18 0727  WBC 3.8* 3.4* 3.3* 4.3 4.4  NEUTROABS 1.5* 1.4* 1.5* 1.7 1.6*  HGB 7.6* 7.5* 7.3* 7.8* 8.0*  HCT 23.1* 23.2* 22.1* 23.2* 24.3*  MCV 92.8 90.6 90.2 90.3 90.3  PLT 208 196 204 208 694   Basic Metabolic Panel: Recent Labs  Lab 09/01/18 0602 09/02/18 0508 09/03/18 0517  09/04/18 0518 09/05/18 0727  NA 131* 132* 130* 132* 132*  K 3.6 3.6 3.6 3.6 3.8  CL 97* 97* 98 98 96*  CO2 '24 24 23 24 24  ' GLUCOSE 111* 113* 119* 108* 124*  BUN '14 13 15 19 ' 25*  CREATININE 1.17 1.28* 1.29* 1.28* 1.42*  CALCIUM 9.4 9.5 9.7 10.3 10.2  MG 1.8 1.9 1.9 1.9 2.0  PHOS 2.4* 4.3 4.1 4.5 3.7   GFR: Estimated Creatinine Clearance: 97.4 mL/min (A) (by C-G formula based on SCr of 1.42 mg/dL (H)). Liver Function Tests: Recent Labs  Lab 09/01/18 0602 09/02/18 0508 09/03/18 0517 09/04/18 0518 09/05/18 0727  AST 14* '16 16 18 18  ' ALT '9 10 9 11 12  ' ALKPHOS 49 49 50 51 56  BILITOT 0.3 0.4 0.3 0.4 0.4  PROT 11.9* 11.7* >12.0* >12.0* >12.0*  ALBUMIN 2.5* 2.5* 2.7* 2.7* 2.8*   No results for input(s): LIPASE, AMYLASE in  the last 168 hours. No results for input(s): AMMONIA in the last 168 hours. Coagulation Profile: Recent Labs  Lab 08/30/18 1733 08/31/18 0551  INR 1.3* 1.2   Cardiac Enzymes: No results for input(s): CKTOTAL, CKMB, CKMBINDEX, TROPONINI in the last 168 hours. BNP (last 3 results) No results for input(s): PROBNP in the last 8760 hours. HbA1C: Recent Labs    09/04/18 0518  HGBA1C 9.4*   CBG: Recent Labs  Lab 09/04/18 1327 09/04/18 1707 09/05/18 0820 09/05/18 1152 09/05/18 1807  GLUCAP 99 120* 143* 142* 186*   Lipid Profile: No results for input(s): CHOL, HDL, LDLCALC, TRIG, CHOLHDL, LDLDIRECT in the last 72 hours. Thyroid Function Tests: No results for input(s): TSH, T4TOTAL, FREET4, T3FREE, THYROIDAB in the last 72 hours. Anemia Panel: No results for input(s): VITAMINB12, FOLATE, FERRITIN, TIBC, IRON, RETICCTPCT in the last 72 hours. Sepsis Labs: No results for input(s): PROCALCITON, LATICACIDVEN in the last 168 hours.  Recent Results (from the past 240 hour(s))  SARS Coronavirus 2 (CEPHEID - Performed in [facility] hospital lab), Hosp Order     Status: None   Collection Time: 08/30/18  4:41 PM  Result Value Ref Range Status   SARS Coronavirus 2 NEGATIVE NEGATIVE Final    Comment: (NOTE) If result is NEGATIVE SARS-CoV-2 target nucleic acids are NOT DETECTED. The SARS-CoV-2 RNA is generally detectable in upper and lower  respiratory specimens during the acute phase of infection. The lowest  concentration of SARS-CoV-2 viral copies this assay can detect is 250  copies / mL. A negative result does not preclude SARS-CoV-2 infection  and should not be used as the sole basis for treatment or other  patient management decisions.  A negative result may occur with  improper specimen collection / handling, submission of specimen other  than nasopharyngeal swab, presence of viral mutation(s) within the  areas targeted by this assay, and inadequate number of viral copies   (<250 copies / mL). A negative result must be combined with clinical  observations, patient history, and epidemiological information. If result is POSITIVE SARS-CoV-2 target nucleic acids are DETECTED. The SARS-CoV-2 RNA is generally detectable in upper and lower  respiratory specimens dur ing the acute phase of infection.  Positive  results are indicative of active infection with SARS-CoV-2.  Clinical  correlation with patient history and other diagnostic information is  necessary to determine patient infection status.  Positive results do  not rule out bacterial infection or co-infection with other viruses. If result is PRESUMPTIVE POSTIVE SARS-CoV-2 nucleic acids MAY BE PRESENT.   A  presumptive positive result was obtained on the submitted specimen  and confirmed on repeat testing.  While 2019 novel coronavirus  (SARS-CoV-2) nucleic acids may be present in the submitted sample  additional confirmatory testing may be necessary for epidemiological  and / or clinical management purposes  to differentiate between  SARS-CoV-2 and other Sarbecovirus currently known to infect humans.  If clinically indicated additional testing with an alternate test  methodology [phone]) is advised. The SARS-CoV-2 RNA is generally  detectable in upper and lower respiratory sp ecimens during the acute  phase of infection. The expected result is Negative. Fact Sheet for Patients:  StrictlyIdeas.no Fact Sheet for Healthcare Providers: BankingDealers.co.za This test is not yet approved or cleared by the [place] FDA and has been authorized for detection and/or diagnosis of SARS-CoV-2 by FDA under an Emergency Use Authorization (EUA).  This EUA will remain in effect (meaning this test can be used) for the duration of the COVID-19 declaration under Section 564(b)(1) of the Act, 21 U.S.C. section 360bbb-3(b)(1), unless the authorization is terminated or  revoked sooner. Performed at [facility], [place] [address] [place], [place] [address]          Radiology Studies: No results found.      Scheduled Meds: . acyclovir  400 mg Oral BID  . aspirin  81 mg Oral Daily  . docusate sodium  100 mg Oral BID  . enoxaparin (LOVENOX) injection  40 mg Subcutaneous Q24H  . insulin aspart  0-9 Units Subcutaneous TID WC  . pantoprazole  40 mg Oral Q0600  . polyethylene glycol  17 g Oral Daily   Continuous Infusions:   LOS: 6 days    Time spent: 35 mins    Irine Seal, MD [facility]  If 7PM-7AM, please contact night-coverage [email or web address] 09/05/2018, 7:02 PM

## 2018-09-06 LAB — COMPREHENSIVE METABOLIC PANEL
ALT: 11 U/L (ref 0–44)
AST: 17 U/L (ref 15–41)
Albumin: 2.8 g/dL — ABNORMAL LOW (ref 3.5–5.0)
Alkaline Phosphatase: 51 U/L (ref 38–126)
Anion gap: 11 (ref 5–15)
BUN: 30 mg/dL — ABNORMAL HIGH (ref 6–20)
CO2: 19 mmol/L — ABNORMAL LOW (ref 22–32)
Calcium: 9.5 mg/dL (ref 8.9–10.3)
Chloride: 99 mmol/L (ref 98–111)
Creatinine, Ser: 1.44 mg/dL — ABNORMAL HIGH (ref 0.61–1.24)
GFR calc Af Amer: >60 mL/min (ref >60)
GFR calc non Af Amer: 59 mL/min — ABNORMAL LOW (ref >60)
Glucose, Bld: 186 mg/dL — ABNORMAL HIGH (ref 70–99)
Potassium: 3.9 mmol/L (ref 3.5–5.1)
Sodium: 129 mmol/L — ABNORMAL LOW (ref 135–145)
Total Bilirubin: 0.6 mg/dL (ref 0.3–1.2)
Total Protein: >12 g/dL — ABNORMAL HIGH (ref 6.5–8.1)

## 2018-09-06 LAB — CBC WITH DIFFERENTIAL/PLATELET
Abs Immature Granulocytes: 0.04 10*3/uL (ref 0.00–0.07)
Basophils Absolute: 0 10*3/uL (ref 0.0–0.1)
Basophils Relative: 0 %
Eosinophils Absolute: 0 10*3/uL (ref 0.0–0.5)
Eosinophils Relative: 0 %
HCT: 23.5 % — ABNORMAL LOW (ref 39.0–52.0)
Hemoglobin: 7.9 g/dL — ABNORMAL LOW (ref 13.0–17.0)
Immature Granulocytes: 1 %
Lymphocytes Relative: 34 %
Lymphs Abs: 2 10*3/uL (ref 0.7–4.0)
MCH: 30.3 pg (ref 26.0–34.0)
MCHC: 33.6 g/dL (ref 30.0–36.0)
MCV: 90 fL (ref 80.0–100.0)
Monocytes Absolute: 0.2 10*3/uL (ref 0.1–1.0)
Monocytes Relative: 3 %
Neutro Abs: 3.7 10*3/uL (ref 1.7–7.7)
Neutrophils Relative %: 62 %
Platelets: 210 10*3/uL (ref 150–400)
RBC: 2.61 MIL/uL — ABNORMAL LOW (ref 4.22–5.81)
RDW: 13.7 % (ref 11.5–15.5)
WBC: 6 10*3/uL (ref 4.0–10.5)
nRBC: 0 % (ref 0.0–0.2)

## 2018-09-06 LAB — MAGNESIUM: Magnesium: 1.9 mg/dL (ref 1.7–2.4)

## 2018-09-06 LAB — PHOSPHORUS: Phosphorus: 3.7 mg/dL (ref 2.5–4.6)

## 2018-09-06 LAB — GLUCOSE, CAPILLARY
Glucose-Capillary: 142 mg/dL — ABNORMAL HIGH (ref 70–99)
Glucose-Capillary: 191 mg/dL — ABNORMAL HIGH (ref 70–99)
Glucose-Capillary: 139 mg/dL — ABNORMAL HIGH (ref 70–99)

## 2018-09-06 MED ORDER — SENNOSIDES-DOCUSATE SODIUM 8.6-50 MG PO TABS
1.0000 | ORAL_TABLET | Freq: Two times a day (BID) | ORAL | Status: DC
  Administered 2018-09-06 – 2018-09-07 (×2): 1 via ORAL
  Filled 2018-09-06 (×2): qty 1

## 2018-09-06 MED ORDER — SODIUM CHLORIDE 0.9 % IV SOLN
INTRAVENOUS | Status: AC
  Administered 2018-09-06 – 2018-09-07 (×3): via INTRAVENOUS

## 2018-09-06 NOTE — Progress Notes (Signed)
PROGRESS NOTE    Edward Todd  MAY:[id number] DOB: 1975/10/27 DOA: 08/30/2018 PCP: Patient, No Pcp Per    Brief Narrative: (Start on day 1 of progress note - keep it brief and live) HPI per Edward Todd on 08/30/2018 Edward Todd a 43 y.o.malewithno significant pastmedical historyhad an accident at work in March 2020 where the oxygen tank fell on his back at the lower lumbar upper sacral area he went to [employer] doctor and ordered muscle relaxant and pain pills but has not had any imaging done at that time. Since March she has been out of work walking with a cane but today he was unable to get out of bed unable to walk even 1 step. No urinary complaints no bowel complaints no nausea vomiting diarrhea fever chills abdominal pain chest pain shortness of breath cough headaches. No complaints of hematemesis hematuria blood in the urine blood in stool. He reports being treated for pneumonia over a month ago. No complaints of pneumonia at this time or symptoms of pneumonia at this time. No syncope but feels dizzy upon standing.  **Interim History  Patient was still complaining of some back pain and soreness and states that it is more difficult to ambulate now.  I discussed with him about concern for multiple myeloma. He is now agreeable to Bone Biopsy and this is to be done tomorrow. Bone scan showed multiple Thoracic and Lumbar Compression fractures and Thoracic Fractures were Age Indeterminate so an MRI was done of the Thoracic Spine and IR to review it to see if he is a candidate for Kyphoplasty/Vertebroplasty. IR reviewed his images and deemed the patient not a candidate for kyphoplasty or vertebroplasty.  Patient underwent a bone marrow biopsy today and likely has multiple myeloma but results will be available tomorrow and Edward Todd starting Chemotherapy with Bortezomib, Lenalidomide, and Dexamethasone   Assessment & Plan:   Principal Problem:   Multiple  myeloma ([place]) Active Problems:   Symptomatic anemia   HTN (hypertension)   Type 2 diabetes mellitus without complication (HCC)   Hypophosphatemia   Constipation   Anemia of chronic disease  1 newly diagnosed multiple myeloma Patient had presented with back pain, noted to be anemic with hemoglobin dropping as low as 7.3 with no signs or symptoms of bleeding.  MRI of the L-spine showed abnormal marrow signal suspicious for diffuse skeletal multiple myeloma.  Patient also noted to have some pathologic thoracic fractures of T3 and T6-T10 but no associated marrow edema or complicating features.  Bone survey done showed lesions in the scapula bilaterally that can be seen with multiple myeloma.  IR was consulted for vertebroplasty/kyphoplasty however this was not recommended per them.  Patient's pain currently well controlled.  SPEP done showed a M protein of 5.7 g/dL, kappa lambda light chain ratio elevated at 103.81.  Kappa free light chain was elevated at 664.4.  IgG noted to be elevated at 10/02/2007.  Bone marrow biopsy was done with formal results pending however per oncology, bone marrow aspirate is consistent with multiple myeloma with 80-90 myeloma cells in the bone marrow.  Core biopsy pending with cytogenetics and FISH which has been ordered.  Patient being followed by oncology and patient has been started on weekly Velcade and dexamethasone on 09/05/2018.  Patient received a dose of Zometa.  Patient to be started on Revlimid in the outpatient setting per oncology and prescription has been sent out per oncology.  Continue current pain management.  PT/OT.  Oncology following and  appreciate their input and recommendations.  2.  Anemia of chronic disease  likely secondary to problem #1.  Patient with no overt bleeding.  Hemoglobin currently at 7.9.  Transfusion threshold hemoglobin less than 7.  Follow H&H.  3.  Thoracic compression fractures Likely secondary to problem #1.  Thoracic spine MRI showed  diffusely abnormal bone marrow signal highly suspicious of diffuse skeletal multiple myeloma.  Pathologic mild to moderate thoracic compression fractures T3 and 6 through T10 but no associated marrow edema complicating features.  IR reviewed MRI and was felt patient was not a candidate for kyphoplasty or vertebroplasty.  Case was discussed by Edward Todd with orthopedics Edward Todd who recommends no TLSO brace as fractures noted not to be new and recommending outpatient follow-up with orthopedics.  PT/OT.  4.  Hypertension Blood pressure stable.  IV hydralazine as needed.  5.  Hypophosphatemia Repleted.  6.  Obesity  7.  Type 2 diabetes mellitus Hemoglobin A1c noted to be 9.4.  Hemoglobin A1c in 2009 was 6.1.  CBG of 142 this morning.  Continue sliding scale insulin.  Change diet to carb modified diet.  Patient states was on metformin 500 mg twice daily before in the past however stopped taking them several months ago and realizes he needs to be compliant with his medications.  Will need to be started on oral hypoglycemic agents on discharge preferably metformin 500 mg twice daily with close outpatient follow-up.  Patient will need a prescription on discharge. Diabetes coordinator following and appreciate input and recommendations.  8.  Constipation Likely opiate induced.  Improved.  Change Colace to Senokot-S twice daily.  Continue MiraLAX daily.  Follow.  9.  Dehydration IV fluids x 24 hours.   DVT prophylaxis: Lovenox Code Status: Full Family Communication: Updated patient.  No family at bedside. Disposition Plan: CIR once insurance authorization has been obtained hopefully in the next 24 to 48 hours.    Consultants:   Medical Oncology  Interventional Radiology   Discussed Case with Orthopedic Surgery Edward Todd  Procedures:   Skeletal bone survey 08/31/2018  MRI L-spine 08/30/2018  MRI T-spine 09/01/2018  Bone marrow biopsy 09/04/2018  Antimicrobials:   None.   Subjective:  Patient laying in bed.  Denies any chest pain or shortness of breath.  Was very appreciative of Edward Todd who instituted his chemotherapy yesterday.  Patient tolerated first dose of chemotherapy, Zometa and dexamethasone yesterday well.  Patient stated some improvement with back spasm to the point he was able to sit on the edge of the bed last night by himself which he could not do before.   Objective: Vitals:   09/05/18 0628 09/05/18 1458 09/05/18 1954 09/06/18 0549  BP: 126/87 120/77 127/87 (!) 150/82  Pulse: 89 (!) 102 98 95  Resp: _0 Temp: 98.2 F (36.8 C) 97.6 F (36.4 C) 98.4 F (36.9 C) 97.9 F (36.6 C)  TempSrc: Oral Oral Oral Oral  SpO2: 98% 100% 98% 99%  Weight:      Height:        Intake/Output Summary (Last 24 hours) at 09/06/2018 1128 Last data filed at 09/06/2018 0827 Gross per 24 hour  Intake -  Output 2200 ml  Net -2200 ml   Filed Weights   09/03/18 0813  Weight: 130.7 kg    Examination:  General exam: NAD Respiratory system: CTAB.  No wheezes, no crackles, no rhonchi.  Speaking in full sentences.  Normal respiratory effort. Cardiovascular system: Regular rate and rhythm no  murmurs rubs or gallops.  No JVD.  No lower extremity edema. Gastrointestinal system: Abdomen is soft, nontender, nondistended, positive bowel sounds.  No rebound no guarding. Central nervous system: Alert and oriented. No focal neurological deficits. Extremities: Symmetric 5 x 5 power. Skin: No rashes, lesions or ulcers Psychiatry: Judgement and insight appear normal. Mood & affect appropriate.     Data Reviewed: I have personally reviewed following labs and imaging studies  CBC: Recent Labs  Lab 09/02/18 0508 09/03/18 0517 09/04/18 0518 09/05/18 0727 09/06/18 0614  WBC 3.4* 3.3* 4.3 4.4 6.0  NEUTROABS 1.4* 1.5* 1.7 1.6* 3.7  HGB 7.5* 7.3* 7.8* 8.0* 7.9*  HCT 23.2* 22.1* 23.2* 24.3* 23.5*  MCV 90.6 90.2 90.3 90.3 90.0  PLT 196 204 208 212 314   Basic Metabolic Panel:  Recent Labs  Lab 09/02/18 0508 09/03/18 0517 09/04/18 0518 09/05/18 0727 09/06/18 0614  NA 132* 130* 132* 132* 129*  K 3.6 3.6 3.6 3.8 3.9  CL 97* 98 98 96* 99  CO2 _0 19*  GLUCOSE 113* 119* 108* 124* 186*  BUN _1 25* 30*  CREATININE 1.28* 1.29* 1.28* 1.42* 1.44*  CALCIUM 9.5 9.7 10.3 10.2 9.5  MG 1.9 1.9 1.9 2.0 1.9  PHOS 4.3 4.1 4.5 3.7 3.7   GFR: Estimated Creatinine Clearance: 96 mL/min (A) (by C-G formula based on SCr of 1.44 mg/dL (H)). Liver Function Tests: Recent Labs  Lab 09/02/18 0508 09/03/18 0517 09/04/18 0518 09/05/18 0727 09/06/18 0614  AST _2 ALT _3 ALKPHOS 49 50 51 56 51  BILITOT 0.4 0.3 0.4 0.4 0.6  PROT 11.7* >12.0* >12.0* >12.0* >12.0*  ALBUMIN 2.5* 2.7* 2.7* 2.8* 2.8*   No results for input(s): LIPASE, AMYLASE in the last 168 hours. No results for input(s): AMMONIA in the last 168 hours. Coagulation Profile: Recent Labs  Lab 08/30/18 1733 08/31/18 0551  INR 1.3* 1.2   Cardiac Enzymes: No results for input(s): CKTOTAL, CKMB, CKMBINDEX, TROPONINI in the last 168 hours. BNP (last 3 results) No results for input(s): PROBNP in the last 8760 hours. HbA1C: Recent Labs    09/04/18 0518  HGBA1C 9.4*   CBG: Recent Labs  Lab 09/05/18 0820 09/05/18 1152 09/05/18 1807 09/05/18 2035 09/06/18 0800  GLUCAP 143* 142* 186* 208* 142*   Lipid Profile: No results for input(s): CHOL, HDL, LDLCALC, TRIG, CHOLHDL, LDLDIRECT in the last 72 hours. Thyroid Function Tests: No results for input(s): TSH, T4TOTAL, FREET4, T3FREE, THYROIDAB in the last 72 hours. Anemia Panel: No results for input(s): VITAMINB12, FOLATE, FERRITIN, TIBC, IRON, RETICCTPCT in the last 72 hours. Sepsis Labs: No results for input(s): PROCALCITON, LATICACIDVEN in the last 168 hours.  Recent Results (from the past 240 hour(s))  SARS Coronavirus 2 (CEPHEID - Performed in [facility] hospital lab), Hosp Order     Status: None   Collection  Time: 08/30/18  4:41 PM  Result Value Ref Range Status   SARS Coronavirus 2 NEGATIVE NEGATIVE Final    Comment: (NOTE) If result is NEGATIVE SARS-CoV-2 target nucleic acids are NOT DETECTED. The SARS-CoV-2 RNA is generally detectable in upper and lower  respiratory specimens during the acute phase of infection. The lowest  concentration of SARS-CoV-2 viral copies this assay can detect is 250  copies / mL. A negative result does not preclude SARS-CoV-2 infection  and should not be used as the sole basis for treatment or other  patient management decisions.  A negative result may occur with  improper specimen collection / handling, submission of specimen other  than nasopharyngeal swab, presence of viral mutation(s) within the  areas targeted by this assay, and inadequate number of viral copies  (<250 copies / mL). A negative result must be combined with clinical  observations, patient history, and epidemiological information. If result is POSITIVE SARS-CoV-2 target nucleic acids are DETECTED. The SARS-CoV-2 RNA is generally detectable in upper and lower  respiratory specimens dur ing the acute phase of infection.  Positive  results are indicative of active infection with SARS-CoV-2.  Clinical  correlation with patient history and other diagnostic information is  necessary to determine patient infection status.  Positive results do  not rule out bacterial infection or co-infection with other viruses. If result is PRESUMPTIVE POSTIVE SARS-CoV-2 nucleic acids MAY BE PRESENT.   A presumptive positive result was obtained on the submitted specimen  and confirmed on repeat testing.  While 2019 novel coronavirus  (SARS-CoV-2) nucleic acids may be present in the submitted sample  additional confirmatory testing may be necessary for epidemiological  and / or clinical management purposes  to differentiate between  SARS-CoV-2 and other Sarbecovirus currently known to infect humans.  If  clinically indicated additional testing with an alternate test  methodology [id number]) is advised. The SARS-CoV-2 RNA is generally  detectable in upper and lower respiratory sp ecimens during the acute  phase of infection. The expected result is Negative. Fact Sheet for Patients:  StrictlyIdeas.no Fact Sheet for Healthcare Providers: BankingDealers.co.za This test is not yet approved or cleared by the [place] FDA and has been authorized for detection and/or diagnosis of SARS-CoV-2 by FDA under an Emergency Use Authorization (EUA).  This EUA will remain in effect (meaning this test can be used) for the duration of the COVID-19 declaration under Section 564(b)(1) of the Act, 21 U.S.C. section 360bbb-3(b)(1), unless the authorization is terminated or revoked sooner. Performed at [facility], [place] [address], [place], [place] [address]          Radiology Studies: No results found.      Scheduled Meds: . acyclovir  400 mg Oral BID  . aspirin  81 mg Oral Daily  . docusate sodium  100 mg Oral BID  . enoxaparin (LOVENOX) injection  40 mg Subcutaneous Q24H  . insulin aspart  0-9 Units Subcutaneous TID WC  . pantoprazole  40 mg Oral Q0600  . polyethylene glycol  17 g Oral Daily   Continuous Infusions: . sodium chloride 125 mL/hr at 09/06/18 0909     LOS: 7 days    Time spent: 35 mins    Irine Seal, MD [facility]  If 7PM-7AM, please contact night-coverage [email or web address] 09/06/2018, 11:28 AM

## 2018-09-06 NOTE — H&P (Signed)
Physical Medicine and Rehabilitation Admission H&P    Chief Complaint  Patient presents with   Back Pain  Chief complaint: Back pain HPI: Edward Todd is a 43 year old right-handed male with with history of type II  diabetes mellitus however he had stopped taking his metformin sometime ago.  By report patient had an accident at work March 2020 where the oxygen tank fell on his back at the lower lumbar upper sacral area.  He went to [facility] physician ordered muscle relaxer and pain medications but did not receive any imaging or further work-up.  Since March he has been out of work walking with a cane with decreasing functional mobility.  No bowel or bladder disturbances.  He does report being treated for pneumonia approximately 1 month ago.  Per chart review lives with his spouse.  One level home one-step to entry.  Presented 08/30/2018 with increasing back pain.  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  Subarticular zone narrowing could affect both L5 nerve roots.  Chronic appearing deformities of L2 and L4 but no acute or subacute bone marrow edema to suggest a recent injury.  Diffusely abnormal bone marrow signal, with subcentimeter foci of postcontrast enhancement.  This felt could possibly be related to severe anemia, body habitus but a marrow replacement process could not be excluded.  Hematology service is consulted with noted hemoglobin 7.6, RBCs 2.52 as well as hyperproteinemia..  A bone survey showed lesions in the scapula bilaterally suggesting possible multiple myeloma.  IR was consulted for possible vertebroplasty kyphoplasty however this was not recommended per IR team.  A bone marrow biopsy was performed consistent with multiple myeloma.  He was started on weekly Velcade and dexamethasone 09/05/2018.  Subcutaneous Lovenox for DVT prophylaxis.  Findings of elevated  hemoglobin A1c of 9.4 placed on sliding scale insulin.  Therapy evaluations completed and patient was admitted for a comprehensive rehab program.  Review of Systems  Constitutional: Negative for chills and fever.  HENT: Negative for hearing loss.   Eyes: Negative for blurred vision and double vision.  Respiratory: Positive for shortness of breath.   Cardiovascular: Negative for chest pain, palpitations and leg swelling.  Gastrointestinal: Positive for constipation. Negative for heartburn, nausea and vomiting.  Genitourinary: Positive for urgency. Negative for dysuria, flank pain and hematuria.  Musculoskeletal: Positive for back pain and myalgias.  Skin: Negative for rash.  Neurological: Negative for weakness.  All other systems reviewed and are negative.  History reviewed. No pertinent past medical history. History reviewed. No pertinent surgical history. Family History  Problem Relation Age of Onset   Diabetes Maternal Grandmother    Diabetes Paternal Grandfather    Social History:  reports that he has never smoked. He has never used smokeless tobacco. He reports that he does not drink alcohol or use drugs. Allergies:  Allergies  Allergen Reactions   Shellfish Allergy Rash   Medications Prior to Admission  Medication Sig Dispense Refill   Ascorbic Acid (VITAMIN C PO) Take 1 tablet by mouth daily.     Cyanocobalamin (B-12 PO) Take 1 tablet by mouth daily.     meloxicam (MOBIC) 15 MG tablet Take 7.5-15 mg by mouth daily.     methocarbamol (ROBAXIN) 500 MG tablet Take 500-1,000 mg by mouth at bedtime as needed for muscle pain.     POTASSIUM PO Take 1 tablet by mouth daily.  valACYclovir (VALTREX) 1000 MG tablet Take 1,000 mg by mouth daily as needed (cold sores).      albuterol (PROVENTIL HFA;VENTOLIN HFA) 108 (90 Base) MCG/ACT inhaler Inhale 2 puffs into the lungs every 4 (four) hours as needed for wheezing or shortness of breath (cough, shortness of breath or  wheezing.). (Patient not taking: Reported on 08/30/2018) 1 Inhaler 1    Drug Regimen Review Drug regimen was reviewed and remains appropriate with no significant issues identified  Home: Home Living Family/patient expects to be discharged to:: Private residence Living Arrangements: Spouse/significant other Available Help at Discharge: Family Type of Home: House Home Access: Stairs to enter Technical brewer of Steps: 1 Home Layout: One level [place]: [place] - single point   Functional History: Prior Function Level of Independence: Independent with assistive device(s) Comments: usine cane for functional mobility until 08/30/18 then had sudden increase and pain and decrease in function  Functional Status:  Mobility: Bed Mobility Overal bed mobility: Needs Assistance Bed Mobility: Rolling, Sidelying to Sit, Sit to Sidelying Rolling: Mod assist, +2 for physical assistance, +2 for safety/equipment Sidelying to sit: +2 for physical assistance, +2 for safety/equipment, Max assist Sit to sidelying: Max assist, +2 for physical assistance, +2 for safety/equipment General bed mobility comments: patient with increased time and effort during all transitional movements to prevent pain. Offered physical assist to patient with patient declining, also attempted to use gait belt to guide LE off edge of bed. Patient only able to come to half sitting at EOB with heavy use of UE for assist. Pain limiting movement. Oncology MD arriving at end of session Transfers Overall transfer level: Needs assistance Equipment used: Rolling walker (2 wheeled) Transfers: Sit to/from Stand Sit to Stand: From elevated surface, Mod assist, +2 physical assistance, +2 safety/equipment General transfer comment: unable Ambulation/Gait General Gait Details: NT-pt not yet safely able 2* pain/fall risk    ADL: ADL Overall ADL's : Needs assistance/impaired Eating/Feeding: Minimal assistance, Moderate assistance,  Sitting Eating/Feeding Details (indicate cue type and reason): sitting EOB, needs to use BUEs for support sitting EOB. Able to use one UE for few minutes, then requiring assist Grooming: Set up, Bed level Grooming Details (indicate cue type and reason): sitting EOB, increased time to be able to shift weight to hold TB with R hand. Needed management for water, spitting, wiping mouth. Pt having to stop x2 and keep TB in mouth and place RUE down on bed to brace for pain Upper Body Bathing: Set up, Bed level Upper Body Bathing Details (indicate cue type and reason): increased time and assist when sitting Lower Body Bathing: Maximal assistance, Bed level Upper Body Dressing : Minimal assistance, Bed level Lower Body Dressing: Total assistance, Sitting/lateral leans, Sit to/from stand, Adhering to back precautions Toilet Transfer: Total assistance, BSC, RW Toilet Transfer Details (indicate cue type and reason): currently using bed pan and urinal at bed level; OOB t/f not able to be safely attempted this date Toileting- Clothing Manipulation and Hygiene: Total assistance, Adhering to back precautions Tub/ Shower Transfer: Total assistance Functional mobility during ADLs: Total assistance General ADL Comments: performed adl at bed level prior to sitting up.  reinforced back precautions. Pt would benefit from AE  Cognition: Cognition Overall Cognitive Status: Within Functional Limits for tasks assessed Cognition Arousal/Alertness: Awake/alert Behavior During Therapy: WFL for tasks assessed/performed Overall Cognitive Status: Within Functional Limits for tasks assessed  Physical Exam: Blood pressure (!) 150/82, pulse 95, temperature 97.9 F (36.6 C), temperature source Oral, resp. rate 19,  height 6' 3.51" (1.918 m), weight 130.7 kg, SpO2 99 %. Physical Exam  Constitutional: He is oriented to person, place, and time. He appears well-developed.  HENT:  Head: Normocephalic and atraumatic.    Mouth/Throat: Oropharynx is clear and moist. No oropharyngeal exudate.  Eyes: Pupils are equal, round, and reactive to light. EOM are normal.  Neck: Normal range of motion. Neck supple. No thyromegaly present.  Cardiovascular: Normal rate. Exam reveals no friction rub.  Murmur heard. Respiratory: Effort normal. No respiratory distress. He has no wheezes. He has no rales.  GI: Soft. He exhibits no distension. There is no abdominal tenderness. There is no rebound.  Musculoskeletal: Normal range of motion.        General: No edema.     Comments: LB TTP and painful with proximal LE movement  Neurological: He is alert and oriented to person, place, and time. He displays normal reflexes. No cranial nerve deficit. He exhibits normal muscle tone.  Alert in no acute distress.  Oriented x3.  Good awareness of deficits. UE 5/5, LE 3-/5 HF, 3/5 KE and 4/5 ADF/APF. No sensory deficits  Skin: Skin is warm and dry.  Psychiatric: He has a normal mood and affect. His behavior is normal. Judgment and thought content normal.    Results for orders placed or performed during the hospital encounter of 08/30/18 (from the past 48 hour(s))  Glucose, capillary     Status: None   Collection Time: 09/04/18  1:27 PM  Result Value Ref Range   Glucose-Capillary 99 70 - 99 mg/dL   Comment 1 Notify RN    Comment 2 Document in Chart   Glucose, capillary     Status: Abnormal   Collection Time: 09/04/18  5:07 PM  Result Value Ref Range   Glucose-Capillary 120 (H) 70 - 99 mg/dL  CBC with Differential/Platelet     Status: Abnormal   Collection Time: 09/05/18  7:27 AM  Result Value Ref Range   WBC 4.4 4.0 - 10.5 K/uL   RBC 2.69 (L) 4.22 - 5.81 MIL/uL   Hemoglobin 8.0 (L) 13.0 - 17.0 g/dL   HCT 24.3 (L) 39.0 - 52.0 %   MCV 90.3 80.0 - 100.0 fL   MCH 29.7 26.0 - 34.0 pg   MCHC 32.9 30.0 - 36.0 g/dL   RDW 13.8 11.5 - 15.5 %   Platelets 212 150 - 400 K/uL   nRBC 0.0 0.0 - 0.2 %   Neutrophils Relative % 37 %   Neutro  Abs 1.6 (L) 1.7 - 7.7 K/uL   Lymphocytes Relative 53 %   Lymphs Abs 2.3 0.7 - 4.0 K/uL   Monocytes Relative 8 %   Monocytes Absolute 0.4 0.1 - 1.0 K/uL   Eosinophils Relative 1 %   Eosinophils Absolute 0.0 0.0 - 0.5 K/uL   Basophils Relative 0 %   Basophils Absolute 0.0 0.0 - 0.1 K/uL   Immature Granulocytes 1 %   Abs Immature Granulocytes 0.02 0.00 - 0.07 K/uL    Comment: Performed at [facility], [place] [address] [place], [place] [address]  Comprehensive metabolic panel     Status: Abnormal   Collection Time: 09/05/18  7:27 AM  Result Value Ref Range   Sodium 132 (L) 135 - 145 mmol/L   Potassium 3.8 3.5 - 5.1 mmol/L   Chloride 96 (L) 98 - 111 mmol/L   CO2 24 22 - 32 mmol/L   Glucose, Bld 124 (H) 70 - 99 mg/dL   BUN  25 (H) 6 - 20 mg/dL   Creatinine, Ser 1.42 (H) 0.61 - 1.24 mg/dL   Calcium 10.2 8.9 - 10.3 mg/dL   Total Protein >12.0 (H) 6.5 - 8.1 g/dL    Comment: CONSISTENT WITH PREVIOUS RESULT   Albumin 2.8 (L) 3.5 - 5.0 g/dL   AST 18 15 - 41 U/L   ALT 12 0 - 44 U/L   Alkaline Phosphatase 56 38 - 126 U/L   Total Bilirubin 0.4 0.3 - 1.2 mg/dL   GFR calc non Af Amer 60 (L) >60 mL/min   GFR calc Af Amer >60 >60 mL/min   Anion gap 12 5 - 15    Comment: Performed at [facility], [place] [address], [place], [place] [address]  Magnesium     Status: None   Collection Time: 09/05/18  7:27 AM  Result Value Ref Range   Magnesium 2.0 1.7 - 2.4 mg/dL    Comment: Performed at [facility], [place] [address] [place], [place] [address]  Phosphorus     Status: None   Collection Time: 09/05/18  7:27 AM  Result Value Ref Range   Phosphorus 3.7 2.5 - 4.6 mg/dL    Comment: Performed at [facility], [place] [address] [place], [place] [address]  Glucose, capillary     Status: Abnormal   Collection Time: 09/05/18  8:20 AM  Result Value Ref Range   Glucose-Capillary 143 (H) 70 - 99 mg/dL   Comment 1 Notify RN    Comment  2 Document in Chart   Glucose, capillary     Status: Abnormal   Collection Time: 09/05/18 11:52 AM  Result Value Ref Range   Glucose-Capillary 142 (H) 70 - 99 mg/dL   Comment 1 Notify RN    Comment 2 Document in Chart   Glucose, capillary     Status: Abnormal   Collection Time: 09/05/18  6:07 PM  Result Value Ref Range   Glucose-Capillary 186 (H) 70 - 99 mg/dL   Comment 1 Notify RN    Comment 2 Document in Chart   Glucose, capillary     Status: Abnormal   Collection Time: 09/05/18  8:35 PM  Result Value Ref Range   Glucose-Capillary 208 (H) 70 - 99 mg/dL  Comprehensive metabolic panel     Status: Abnormal   Collection Time: 09/06/18  6:14 AM  Result Value Ref Range   Sodium 129 (L) 135 - 145 mmol/L   Potassium 3.9 3.5 - 5.1 mmol/L   Chloride 99 98 - 111 mmol/L   CO2 19 (L) 22 - 32 mmol/L   Glucose, Bld 186 (H) 70 - 99 mg/dL   BUN 30 (H) 6 - 20 mg/dL   Creatinine, Ser 1.44 (H) 0.61 - 1.24 mg/dL   Calcium 9.5 8.9 - 10.3 mg/dL   Total Protein >12.0 (H) 6.5 - 8.1 g/dL    Comment: CONSISTENT WITH PREVIOUS RESULT   Albumin 2.8 (L) 3.5 - 5.0 g/dL   AST 17 15 - 41 U/L   ALT 11 0 - 44 U/L   Alkaline Phosphatase 51 38 - 126 U/L   Total Bilirubin 0.6 0.3 - 1.2 mg/dL   GFR calc non Af Amer 59 (L) >60 mL/min   GFR calc Af Amer >60 >60 mL/min   Anion gap 11 5 - 15    Comment: Performed at [facility], [place] [address] [place],  [address]  CBC with Differential/Platelet     Status:  Abnormal   Collection Time: 09/06/18  6:14 AM  Result Value Ref Range   WBC 6.0 4.0 - 10.5 K/uL   RBC 2.61 (L) 4.22 - 5.81 MIL/uL   Hemoglobin 7.9 (L) 13.0 - 17.0 g/dL   HCT 23.5 (L) 39.0 - 52.0 %   MCV 90.0 80.0 - 100.0 fL   MCH 30.3 26.0 - 34.0 pg   MCHC 33.6 30.0 - 36.0 g/dL   RDW 13.7 11.5 - 15.5 %   Platelets 210 150 - 400 K/uL   nRBC 0.0 0.0 - 0.2 %   Neutrophils Relative % 62 %   Neutro Abs 3.7 1.7 - 7.7 K/uL   Lymphocytes Relative 34 %   Lymphs Abs 2.0 0.7 - 4.0  K/uL   Monocytes Relative 3 %   Monocytes Absolute 0.2 0.1 - 1.0 K/uL   Eosinophils Relative 0 %   Eosinophils Absolute 0.0 0.0 - 0.5 K/uL   Basophils Relative 0 %   Basophils Absolute 0.0 0.0 - 0.1 K/uL   Immature Granulocytes 1 %   Abs Immature Granulocytes 0.04 0.00 - 0.07 K/uL    Comment: Performed at [facility], [place] [address] [place], [place] [address]  Magnesium     Status: None   Collection Time: 09/06/18  6:14 AM  Result Value Ref Range   Magnesium 1.9 1.7 - 2.4 mg/dL    Comment: Performed at [facility], [place] [address] [place], [place] [address]  Phosphorus     Status: None   Collection Time: 09/06/18  6:14 AM  Result Value Ref Range   Phosphorus 3.7 2.5 - 4.6 mg/dL    Comment: Performed at [facility], [place] [address], [place], [place] [address]  Glucose, capillary     Status: Abnormal   Collection Time: 09/06/18  8:00 AM  Result Value Ref Range   Glucose-Capillary 142 (H) 70 - 99 mg/dL   Comment 1 Notify RN    Comment 2 Document in Chart    No results found.     Medical Problem List and Plan: 1.  Decreased functional mobility secondary to newly diagnosed multiple myeloma.   -admit to inpatient rehab  - weekly Velcade and dexamethasone initiated 09/05/2018   -Dr. Burr Medico is oncologist 2.  Antithrombotics: -DVT/anticoagulation: Subcutaneous Lovenox  -antiplatelet therapy: Aspirin 81 mg daily 3. Pain Management: Oxycodone as needed  -will schedule flexeril TID  -kpad prn for spasms 4. Mood: Provide emotional support  -antipsychotic agents: N/A 5. Neuropsych: This patient is capable of making decisions on his own behalf. 6. Skin/Wound Care: Routine skin checks 7. Fluids/Electrolytes/Nutrition: Routine in and outs with follow-up chemistries  -pt with poor appetite  -will request RD assistance in improving PO intake 8.  Acute on chronic anemia.  Follow-up hematology oncology services 9.  Diabetes  mellitus.  Hemoglobin A1c 9.4.  Patient stopped taking his Glucophage sometime ago.  Provide education 10.  Morbid obesity.  BMI 35.53.  Dietary follow-up 11.  Constipation.  Laxative assistance       Cathlyn Parsons, PA-C 09/06/2018

## 2018-09-06 NOTE — Progress Notes (Signed)
Edward Todd   DOB:[birth year]   GB#:[id number]   GFR#:[id number]  Oncology follow up note   Subjective: Patient tolerated first dose Velcade, zometa and dexamethasone well yesterday.  He is back spasm actually improved, he was able to sit on the age of bed last night by him self last night, which he could not do before. He had mild    Objective:  Vitals:   09/05/18 1954 09/06/18 0549  BP: 127/87 (!) 150/82  Pulse: 98 95  Resp: 20 19  Temp: 98.4 F (36.9 C) 97.9 F (36.6 C)  SpO2: 98% 99%    Body mass index is 35.53 kg/m.  Intake/Output Summary (Last 24 hours) at 09/06/2018 0854 Last data filed at 09/06/2018 0827 Gross per 24 hour  Intake -  Output 2600 ml  Net -2600 ml     Sclerae unicteric  Oropharynx clear  No peripheral adenopathy  Lungs clear -- no rales or rhonchi  Heart regular rate and rhythm  Abdomen benign  Neuro nonfocal  No leg edema   CBG (last 3)  Recent Labs    09/05/18 1807 09/05/18 2035 09/06/18 0800  GLUCAP 186* 208* 142*     Urine Studies No results for input(s): UHGB, CRYS in the last 72 hours.  Invalid input(s): UACOL, UAPR, USPG, UPH, UTP, UGL, [place], UBIL, UNIT, UROB, [place], UEPI, UWBC, Junie Panning [place], [place], [name]  Basic Metabolic Panel: Recent Labs  Lab 09/02/18 [phone] 09/03/18 0517 09/04/18 0518 09/05/18 0727 09/06/18 0614  NA 132* 130* 132* 132* 129*  K 3.6 3.6 3.6 3.8 3.9  CL 97* 98 98 96* 99  CO2 _0 19*  GLUCOSE 113* 119* 108* 124* 186*  BUN _1 25* 30*  CREATININE 1.28* 1.29* 1.28* 1.42* 1.44*  CALCIUM 9.5 9.7 10.3 10.2 9.5  MG 1.9 1.9 1.9 2.0 1.9  PHOS 4.3 4.1 4.5 3.7 3.7   GFR Estimated Creatinine Clearance: 96 mL/min (A) (by C-G formula based on SCr of 1.44 mg/dL (H)). Liver Function Tests: Recent Labs  Lab 09/02/18 0508 09/03/18 0517 09/04/18 0518 09/05/18 0727 09/06/18 0614  AST _2 ALT _3 ALKPHOS 49 50 51 56 51  BILITOT 0.4 0.3 0.4 0.4 0.6  PROT 11.7* >12.0*  >12.0* >12.0* >12.0*  ALBUMIN 2.5* 2.7* 2.7* 2.8* 2.8*   No results for input(s): LIPASE, AMYLASE in the last 168 hours. No results for input(s): AMMONIA in the last 168 hours. Coagulation profile Recent Labs  Lab 08/30/18 1733 08/31/18 0551  INR 1.3* 1.2    CBC: Recent Labs  Lab 09/02/18 0508 09/03/18 0517 09/04/18 0518 09/05/18 0727 09/06/18 0614  WBC 3.4* 3.3* 4.3 4.4 6.0  NEUTROABS 1.4* 1.5* 1.7 1.6* 3.7  HGB 7.5* 7.3* 7.8* 8.0* 7.9*  HCT 23.2* 22.1* 23.2* 24.3* 23.5*  MCV 90.6 90.2 90.3 90.3 90.0  PLT 196 204 208 212 210   Cardiac Enzymes: No results for input(s): CKTOTAL, CKMB, CKMBINDEX, TROPONINI in the last 168 hours. BNP: Invalid input(s): POCBNP CBG: Recent Labs  Lab 09/05/18 0820 09/05/18 1152 09/05/18 1807 09/05/18 2035 09/06/18 0800  GLUCAP 143* 142* 186* 208* 142*   D-Dimer No results for input(s): DDIMER in the last 72 hours. Hgb A1c Recent Labs    09/04/18 0518  HGBA1C 9.4*   Lipid Profile No results for input(s): CHOL, HDL, LDLCALC, TRIG, CHOLHDL, LDLDIRECT in the last 72 hours. Thyroid function studies No results for input(s): TSH, T4TOTAL, T3FREE, THYROIDAB in  the last 72 hours.  Invalid input(s): FREET3 Anemia work up No results for input(s): VITAMINB12, FOLATE, FERRITIN, TIBC, IRON, RETICCTPCT in the last 72 hours. Microbiology Recent Results (from the past 240 hour(s))  SARS Coronavirus 2 (CEPHEID - Performed in [place] hospital lab), Hosp Order     Status: None   Collection Time: 08/30/18  4:41 PM  Result Value Ref Range Status   SARS Coronavirus 2 NEGATIVE NEGATIVE Final    Comment: (NOTE) If result is NEGATIVE SARS-CoV-2 target nucleic acids are NOT DETECTED. The SARS-CoV-2 RNA is generally detectable in upper and lower  respiratory specimens during the acute phase of infection. The lowest  concentration of SARS-CoV-2 viral copies this assay can detect is 250  copies / mL. A negative result does not preclude  SARS-CoV-2 infection  and should not be used as the sole basis for treatment or other  patient management decisions.  A negative result may occur with  improper specimen collection / handling, submission of specimen other  than nasopharyngeal swab, presence of viral mutation(s) within the  areas targeted by this assay, and inadequate number of viral copies  (<250 copies / mL). A negative result must be combined with clinical  observations, patient history, and epidemiological information. If result is POSITIVE SARS-CoV-2 target nucleic acids are DETECTED. The SARS-CoV-2 RNA is generally detectable in upper and lower  respiratory specimens dur ing the acute phase of infection.  Positive  results are indicative of active infection with SARS-CoV-2.  Clinical  correlation with patient history and other diagnostic information is  necessary to determine patient infection status.  Positive results do  not rule out bacterial infection or co-infection with other viruses. If result is PRESUMPTIVE POSTIVE SARS-CoV-2 nucleic acids MAY BE PRESENT.   A presumptive positive result was obtained on the submitted specimen  and confirmed on repeat testing.  While 2019 novel coronavirus  (SARS-CoV-2) nucleic acids may be present in the submitted sample  additional confirmatory testing may be necessary for epidemiological  and / or clinical management purposes  to differentiate between  SARS-CoV-2 and other Sarbecovirus currently known to infect humans.  If clinically indicated additional testing with an alternate test  methodology [phone]) is advised. The SARS-CoV-2 RNA is generally  detectable in upper and lower respiratory sp ecimens during the acute  phase of infection. The expected result is Negative. Fact Sheet for Patients:  StrictlyIdeas.no Fact Sheet for Healthcare Providers: BankingDealers.co.za This test is not yet approved or cleared by the  [place] FDA and has been authorized for detection and/or diagnosis of SARS-CoV-2 by FDA under an Emergency Use Authorization (EUA).  This EUA will remain in effect (meaning this test can be used) for the duration of the COVID-19 declaration under Section 564(b)(1) of the Act, 21 U.S.C. section 360bbb-3(b)(1), unless the authorization is terminated or revoked sooner. Performed at [facility], [place] [address] [place], [place] [address]       Studies:  No results found.  Assessment: 43 y.o. African-American male, without past medical history, presented with debilitating low back pain,and moderate anemia.  1. Multiple myeloma, IgG Kappa, stage III 2. Moderate anemia  3. Type 2 diabetes  4. Severe low back pain, from thoracic and lumbar compression fracture, lumbar spine stenosis     Plan:  -he started weekly velcade and dexa yesterday, tolerated well, will continue, also received first dose zometa  -REvlimid prescription sent out yesterday, he will start as soon as he receives it  -HgA1c  in diabetic range, pt stated that he was on metformin beofre, stopped on his own a year ago, will continue ISS, and restart metformin on discharge, discussed with Dr. [place] Silos  -OK to repeat lab twice a week, no need daily  -OK to transfer to inpt rehab when it's ready  -will f/u    Truitt Merle, MD 09/06/2018  8:54 AM

## 2018-09-06 NOTE — PMR Pre-admission (Signed)
PMR Admission Coordinator Pre-Admission Assessment  Patient: Edward Todd is an 43 y.o., male MRN: [id number] DOB: 08/10/1975 Height: 6' 3.51" (191.8 cm) Weight: 130.7 kg  Insurance Information HMO:     PPO: yes    PCP:      IPA:      80/20:      OTHER:  PRIMARY: [employer]      Policy#: [id number]      Subscriber: patient CM Name: Almyra Free      Phone#: [phone]     Fax#: [phone] Pre-Cert#: [id number] Authorization provided by Almyra Free for admission 5/15 through 5/17 with updates due 5/18 to Victorino Dike (phone: [phone], fax [phone])     Employer: [employer] Benefits:  Phone #: [phone]     Name:  Irene Shipper. Date: 04/25/2018     Deduct: $4000 (met)      Out of Pocket Max: [phone] (met [phone])      Life Max: n/a CIR: 70%      SNF: 70% Outpatient: 70%     Co-Pay:  Home Health: 70%      Co-Pay:  DME: 70%     Co-Pay:  Providers: in network  SECONDARY:       Policy#:       Subscriber:  CM Name:       Phone#:      Fax#:  Pre-Cert#:       Employer:  Benefits:  Phone #:      Name:  Eff. Date:      Deduct:       Out of Pocket Max:       Life Max:  CIR:       SNF:  Outpatient:      Co-Pay:  Home Health:       Co-Pay:  DME:      Co-Pay:   Medicaid Application Date:       Case Manager:  Disability Application Date:       Case Worker:   The "Data Collection Information Summary" for patients in Inpatient Rehabilitation Facilities with attached "Privacy Act [place] Records" was provided and verbally reviewed with: Patient  Emergency Contact Information Contact Information    Name Relation Home Work Mobile   Edward Todd  [phone]     No,Contact  [phone]        Current Medical History  Patient Admitting Diagnosis: multiple myeloma  History of Present Illness: Edward Todd is a 43 year old right-handed male with with history of type II  diabetes mellitus, however he had stopped taking his metformin sometime ago.  By report  patient had an accident at work March 2020 where an oxygen tank fell on his back at the lower lumbar/upper sacral area.  He went to [facility] And the physician ordered muscle relaxer and pain medications but did not receive any imaging or further work-up.  Since March he has been out of work walking with a cane with decreasing functional mobility.  No bowel or bladder disturbances.  He does report being treated for pneumonia approximately 1 month ago.  Presented 08/30/2018 with increasing back pain and inability to ambulate.  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  Subarticular zone narrowing could affect both L5 nerve roots.  Chronic appearing deformities of L2 and L4 but no acute or subacute bone marrow edema to suggest a recent injury.  Diffusely abnormal bone marrow signal, with subcentimeter foci of postcontrast  enhancement.  This felt could possibly be related to severe anemia, body habitus but a marrow replacement process could not be excluded.  Multiple compression fractures noted in thoracic and lumbar spine.  Orthopedics consulted and recommended TLSO for conservative management of compression fractures.  Hematology service was consulted with noted hemoglobin 7.6, RBCs 2.52 as well as hyperproteinemia..  A bone survey showed lesions in the scapula bilaterally suggesting possible multiple myeloma.  IR was consulted for possible vertebroplasty/kyphoplasty however this was not recommended per IR team.  A bone marrow biopsy was performed consistent with multiple myeloma.  He was started on weekly Velcade and dexamethasone 09/05/2018.  Subcutaneous Lovenox for DVT prophylaxis.  Findings of elevated hemoglobin A1c of 9.4 placed on sliding scale insulin.    Patient's medical record from [facility] has been reviewed by the rehabilitation admission coordinator and physician.  Past  Medical History  History reviewed. No pertinent past medical history.  Family History   family history includes Diabetes in his maternal grandmother and paternal grandfather.  Prior Rehab/Hospitalizations Has the patient had prior rehab or hospitalizations prior to admission? No  Has the patient had major surgery during 100 days prior to admission? Yes, bone marrow biopsy   Current Medications  Current Facility-Administered Medications:  .  acyclovir (ZOVIRAX) tablet 400 mg, 400 mg, Oral, BID, Wofford, Drew A, RPH, 400 mg at 09/07/18 1032 .  aspirin chewable tablet 81 mg, 81 mg, Oral, Daily, Eugenie Filler, MD, 81 mg at 09/07/18 1031 .  cyclobenzaprine (FLEXERIL) tablet 5 mg, 5 mg, Oral, TID PRN, Georgette Shell, MD, 5 mg at 09/07/18 1224 .  enoxaparin (LOVENOX) injection 40 mg, 40 mg, Subcutaneous, Q24H, Eugenie Filler, MD, 40 mg at 09/06/18 1804 .  hydrALAZINE (APRESOLINE) injection 5 mg, 5 mg, Intravenous, Q4H PRN, Georgette Shell, MD, 5 mg at 08/31/18 0093 .  HYDROmorphone (DILAUDID) injection 0.5 mg, 0.5 mg, Intravenous, Q3H PRN, Georgette Shell, MD, 0.5 mg at 09/07/18 1035 .  insulin aspart (novoLOG) injection 0-9 Units, 0-9 Units, Subcutaneous, TID WC, SheikhGeorgina Quint [place], [place], 2 Units at 09/06/18 1824 .  oxyCODONE (Oxy IR/ROXICODONE) immediate release tablet 5 mg, 5 mg, Oral, Q8H PRN, Raiford Noble Latif, DO, 5 mg at 09/07/18 1224 .  pantoprazole (PROTONIX) EC tablet 40 mg, 40 mg, Oral, Q0600, Eugenie Filler, MD, 40 mg at 09/07/18 0524 .  polyethylene glycol (MIRALAX / GLYCOLAX) packet 17 g, 17 g, Oral, Daily, Curcio, Kristin R, NP, 17 g at 09/07/18 1031 .  senna-docusate (Senokot-S) tablet 1 tablet, 1 tablet, Oral, BID, Eugenie Filler, MD, 1 tablet at 09/07/18 1032  Patients Current Diet:  Diet Order            Diet Carb Modified Fluid consistency: Thin; Room service appropriate? Yes  Diet effective now              Precautions /  Restrictions Precautions Precautions: Fall, Back Precaution Comments: back precautions/logroll Restrictions Weight Bearing Restrictions: No Other Position/Activity Restrictions: T3, T6 - T 10 path Fx   Has the patient had 2 or more falls or a fall with injury in the past year? 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 Independent  Indoor Mobility: Did the patient need assistance with walking from room to room (with or without device)? Independent  Stairs: Did  the patient need assistance with internal or external stairs (with or without device)? Independent  Functional Cognition: Did the patient need help planning regular tasks such as shopping or remembering to take medications? [place] Devices/Equipment: Cane (specify quad or straight) Home Equipment: Cane - single point  Prior Device Use: Indicate devices/aids used by the patient prior to current illness, exacerbation or injury? had been using a cane immediately PTA, but before injury in March was independent with AD  Current Functional Level Cognition  Overall Cognitive Status: Within Functional Limits for tasks assessed    Extremity Assessment (includes Sensation/Coordination)  Upper Extremity Assessment: Overall WFL for tasks assessed  Lower Extremity Assessment: Defer to PT evaluation RLE Deficits / Details: hip flex at least 3/5, knee ext at least 3+/5. DP/PF 5/5. Limited by pain as well LLE Deficits / Details: hip flex at least 3/5, knee ext at least 3+/5. DP/PF 5/5. Limited by pain as well    ADLs  Overall ADL's : Needs assistance/impaired Eating/Feeding: Minimal assistance, Moderate assistance, Sitting Eating/Feeding Details (indicate cue type and reason): sitting EOB, needs to use BUEs  for support sitting EOB. Able to use one UE for few minutes, then requiring assist Grooming: Set up, Bed level Grooming Details (indicate cue type and reason): sitting EOB, increased time to be able to shift weight to hold TB with R hand. Needed management for water, spitting, wiping mouth. Pt having to stop x2 and keep TB in mouth and place RUE down on bed to brace for pain Upper Body Bathing: Set up, Bed level Upper Body Bathing Details (indicate cue type and reason): increased time and assist when sitting Lower Body Bathing: Maximal assistance, Bed level Upper Body Dressing : Minimal assistance, Bed level Lower Body Dressing: Total assistance, Sitting/lateral leans, Sit to/from stand, Adhering to back precautions Toilet Transfer: Total assistance, BSC, RW Toilet Transfer Details (indicate cue type and reason): currently using bed pan and urinal at bed level; OOB t/f not able to be safely attempted this date Toileting- Clothing Manipulation and Hygiene: Total assistance, Adhering to back precautions Tub/ Shower Transfer: Total assistance Functional mobility during ADLs: Total assistance General ADL Comments: performed adl at bed level prior to sitting up.  reinforced back precautions. Pt would benefit from AE    Mobility  Overal bed mobility: Needs Assistance Bed Mobility: Rolling, Sidelying to Sit, Sit to Sidelying Rolling: Mod assist, +2 for physical assistance, +2 for safety/equipment Sidelying to sit: Max assist Sit to sidelying: Max assist, +2 for physical assistance, +2 for safety/equipment General bed mobility comments: MAX Assist and increased time due to back pain.  But improved self ability from last session.      Transfers  Overall transfer level: Needs assistance Equipment used: Bilateral platform walker, None Transfers: Sit to/from Stand, Stand Pivot Transfers Sit to Stand: +2 physical assistance, +2 safety/equipment, Mod assist Stand pivot transfers: Mod assist, +2  physical assistance, +2 safety/equipment General transfer comment: excessive use of B UE's.  Assisted from elevated bed to [facility] then from [facility] to EVA walker to attemp gait.     Ambulation / Gait / Stairs / Wheelchair Mobility  Ambulation/Gait Ambulation/Gait assistance: Max assist, +2 physical assistance, +2 safety/equipment Gait Distance (Feet): 30 Feet Assistive device: Bilateral platform walker(EVA walker ) Gait Pattern/deviations: Step-to pattern, Decreased step length - left, Decreased step length - right, Trunk flexed General Gait Details: VERY weak B LE paraparesis with excessive lean on EVA walker.  Unable to  support self with regular walker.  Fkex hips and knees.  + 3 assist such that recliner was following.   Gait velocity: decreased     Posture / Balance Balance Overall balance assessment: History of Falls, Needs assistance Sitting-balance support: Bilateral upper extremity supported, Feet supported Sitting balance-Leahy Scale: Poor Standing balance support: Bilateral upper extremity supported Standing balance-Leahy Scale: Zero    Special needs/care consideration BiPAP/CPAP no CPM no Continuous Drip IV: will receive IV chemotherapy 1x/week Dialysis no        Days n/a Life Vest no Oxygen no Special Bed no Trach Size no Wound Vac (area) no      Location n/a Skin intact                               Bowel mgmt: continent, last BM 09/03/2018 Bladder mgmt: continent Diabetic mgmt: yes Behavioral consideration no Chemo/radiation yes, currently on IV Velcade and dexamethasone 1x/week, oral Revlimid QD.    Previous Home Environment (from acute therapy documentation) Living Arrangements: Spouse/significant other Available Help at Discharge: Family Type of Home: House Home Layout: One level Home Access: Stairs to enter Technical brewer of Steps: [place]: No  Discharge Living Setting Plans for Discharge Living Setting: Patient's home Type of Home at  Discharge: House Discharge Home Layout: One level Discharge Home Access: Stairs to enter Entrance Stairs-Rails: None Entrance Stairs-Number of Steps: 1 Discharge Bathroom Shower/Tub: Tub/shower unit, Curtain Discharge Bathroom Toilet: Standard Discharge Bathroom Accessibility: Yes How Accessible: Accessible via walker Does the patient have any problems obtaining your medications?: No  Social/Family/Support Systems Patient Roles: Spouse, Parent Anticipated Caregiver: wife, Marguarite Arbour Merchant navy officer), 2 children, 66 y/o daughter Lauralyn Primes, and an 46 y/o son Anticipated Ambulance person Information: Marguarite Arbour [phone] (c) [phone] (h) Ability/Limitations of Caregiver: Marguarite Arbour is a Pharmacist, hospital, currently working from home Caregiver Availability: 24/7 Discharge Plan Discussed with Primary Caregiver: Yes Is Caregiver In Agreement with Plan?: Yes Does Caregiver/Family have Issues with Lodging/Transportation while Pt is in Rehab?: No  Goals/Additional Needs Patient/Family Goal for Rehab: PT/OT supervision/min assist Expected length of stay: 15-18 days Dietary Needs: carb modified, thin Equipment Needs: tbd Special Service Needs: IV chemo 1x/week per IV therapy and oral chemo daily (per Dr. Burr Medico with Oncology) Pt/Family Agrees to Admission and willing to participate: Yes Program Orientation Provided & Reviewed with Pt/Caregiver Including Roles  & Responsibilities: Yes   Possible need for SNF placement upon discharge: not anticipated  Patient Condition: I have reviewed medical records from [facility], spoken with patient and spouse. I discussed via phone for inpatient rehabilitation assessment.  Patient will benefit from ongoing PT and OT, can actively participate in 3 hours of therapy a day 5 days of the week, and can make measurable gains during the admission.  Patient will also benefit from the coordinated team approach during an Inpatient Acute Rehabilitation admission.  The patient will  receive intensive therapy as well as Rehabilitation physician, nursing, social worker, and care management interventions.  Due to safety, skin/wound care, disease management, medication administration, pain management, patient education and IV chemotherapy the patient requires 24 hour a day rehabilitation nursing.  The patient is currently max+2 with mobility and basic ADLs.  Discharge setting and therapy post discharge at home with home health is anticipated.  Patient has agreed to participate in the Acute Inpatient Rehabilitation Program and will admit today.  Preadmission Screen Completed By:  Michel Santee, PT, DPT  09/07/2018  12:32 PM ______________________________________________________________________   Discussed status with Dr. Naaman Plummer on 09/07/18  at 12:32 PM  and received approval for admission today.  Admission Coordinator:  Michel Santee, PT, DPT time 12:32 PM Sudie Grumbling 09/07/18    Assessment/Plan: Diagnosis: Multiple myeloma with L2, L4 lesions, debility  1. Does the need for close, 24 hr/day Medical supervision in concert with the patient's rehab needs make it unreasonable for this patient to be served in a less intensive setting? Yes 2. Co-Morbidities requiring supervision/potential complications: anemia, pain, onc considerations 3. Due to bladder management, bowel management, safety, skin/wound care, disease management, medication administration, pain management and patient education, does the patient require 24 hr/day rehab nursing? Yes 4. Does the patient require coordinated care of a physician, rehab nurse, PT (1-2 hrs/day, 5 days/week) and OT (1-2 hrs/day, 5 days/week) to address physical and functional deficits in the context of the above medical diagnosis(es)? Yes Addressing deficits in the following areas: balance, endurance, locomotion, strength, transferring, bowel/bladder control, bathing, dressing, feeding, grooming, toileting and psychosocial support 5. Can the patient  actively participate in an intensive therapy program of at least 3 hrs of therapy 5 days a week? Yes 6. The potential for patient to make measurable gains while on inpatient rehab is excellent 7. Anticipated functional outcomes upon discharge from inpatients are: supervision and min assist PT, supervision and min assist OT, n/a SLP 8. Estimated rehab length of stay to reach the above functional goals is: 15-18 days 9. Anticipated D/C setting: Home 10. Anticipated post D/C treatments: [place] therapy 11. Overall Rehab/Functional Prognosis: excellent  MD Signature: Meredith Staggers, MD, [place] [facility] 09/07/2018

## 2018-09-06 NOTE — Progress Notes (Signed)
Physical Therapy Treatment Patient Details Name: Edward Todd MRN: [id number] DOB: March 09, 1976 Today's Date: 09/06/2018    History of Present Illness 43 yo male admitted with symptomatic anemia, weakness, falls at home. Workup for possible multiple myeloma-oncology following. Hx of age indeterminate thoracic and lumbar compression fractures.     PT Comments    Pt in bed on arrival.  Requesting to use the bathroom.  Assisted to EOB.  Sat EOB x 10 min to adjust to position.  Tolerated well.  General bed mobility comments: MAX Assist and increased time due to back pain.  But improved self ability from last session.  Performed several si t to stands using B bed rails.  Excessive UB use to achieve partial stance but unable to lock hips and knees enough to support his weight.  Severe B LE paraparesis.  + 2 assist from elevated bed to [facility] with partial stance and again excessive UB use.  Pt sat on BSC x 12 min for a BM tolerated well.  Minimal pain.  General Gait Details: VERY weak B LE paraparesis with excessive lean on EVA walker.  Unable to support self with regular walker.  Flex hips and knees.  + 3 assist such that recliner was following.  Pt tolerated amb 30 feet.  Pt progressing with his mobility and plans to D/C to CIR.   Follow Up Recommendations  CIR     Equipment Recommendations  None recommended by PT    Recommendations for Other Services       Precautions / Restrictions Precautions Precautions: Fall;Back Precaution Comments: back precautions/logroll Restrictions Weight Bearing Restrictions: No Other Position/Activity Restrictions: T3, T6 - T 10 path Fx    Mobility  Bed Mobility Overal bed mobility: Needs Assistance Bed Mobility: Rolling;Sidelying to Sit;Sit to Sidelying   Sidelying to sit: Max assist       General bed mobility comments: MAX Assist and increased time due to back pain.  But improved self ability from last session.    Transfers Overall transfer  level: Needs assistance Equipment used: Bilateral platform walker;None Transfers: Sit to/from [employer] Transfers Sit to Stand: +2 physical assistance;+2 safety/equipment;Mod assist Stand pivot transfers: Mod assist;+2 physical assistance;+2 safety/equipment       General transfer comment: excessive use of B UE's.  Assisted from elevated bed to [facility] then from [facility] to EVA walker to attemp gait.   Ambulation/Gait Ambulation/Gait assistance: Max assist;+2 physical assistance;+2 safety/equipment Gait Distance (Feet): 30 Feet Assistive device: Bilateral platform walker(EVA walker ) Gait Pattern/deviations: Step-to pattern;Decreased step length - left;Decreased step length - right;Trunk flexed Gait velocity: decreased    General Gait Details: VERY weak B LE paraparesis with excessive lean on EVA walker.  Unable to support self with regular walker.  Fkex hips and knees.  + 3 assist such that recliner was following.     Stairs             Wheelchair Mobility    Modified Rankin (Stroke Patients Only)       Balance                                            Cognition Arousal/Alertness: Awake/alert Behavior During Therapy: WFL for tasks assessed/performed Overall Cognitive Status: Within Functional Limits for tasks assessed  Exercises      General Comments        Pertinent Vitals/Pain Pain Assessment: 0-10 Pain Score: 5  Pain Location: back Pain Descriptors / Indicators: Grimacing Pain Intervention(s): Monitored during session;Patient requesting pain meds-RN notified    Home Living                      Prior Function            PT Goals (current goals can now be found in the care plan section) Progress towards PT goals: Progressing toward goals    Frequency    Min 3X/week      PT Plan Current plan remains appropriate    Co-evaluation              AM-PAC  PT "6 Clicks" Mobility   Outcome Measure  Help needed turning from your back to your side while in a flat bed without using bedrails?: A Lot Help needed moving from lying on your back to sitting on the side of a flat bed without using bedrails?: A Lot Help needed moving to and from a bed to a chair (including a wheelchair)?: Total Help needed standing up from a chair using your arms (e.g., wheelchair or bedside chair)?: Total Help needed to walk in hospital room?: Total Help needed climbing 3-5 steps with a railing? : Total 6 Click Score: 8    End of Session Equipment Utilized During Treatment: Gait belt Activity Tolerance: Other (comment)(B LE paraparesis) Patient left: in chair Nurse Communication: Mobility status;Need for lift equipment(instructed NT to use LIFT to assist pt back to bed ) PT Visit Diagnosis: Pain;Difficulty in walking, not elsewhere classified (R26.2);History of falling (Z91.81);Repeated falls (R29.6);Other abnormalities of gait and mobility (R26.89)     Time: 3428-7681 PT Time Calculation (min) (ACUTE ONLY): 45 min  Charges:  $Gait Training: 8-22 mins $Therapeutic Activity: 23-37 mins                     Rica Koyanagi  PTA Acute  Rehabilitation Services Pager      [phone] Office      [phone]

## 2018-09-07 ENCOUNTER — Inpatient Hospital Stay (HOSPITAL_COMMUNITY)
Admission: RE | Admit: 2018-09-07 | Discharge: 2018-09-25 | DRG: 945 | Disposition: A | Discharge status: Home Health | Payer: 59 | Source: Other Acute Inpatient Hospital | Attending: Physical Medicine & Rehabilitation | Admitting: Physical Medicine & Rehabilitation

## 2018-09-07 ENCOUNTER — Encounter (HOSPITAL_COMMUNITY): Payer: Self-pay

## 2018-09-07 ENCOUNTER — Telehealth: Payer: Self-pay

## 2018-09-07 ENCOUNTER — Other Ambulatory Visit: Payer: Self-pay

## 2018-09-07 DIAGNOSIS — E1165 Type 2 diabetes mellitus with hyperglycemia: Secondary | ICD-10-CM | POA: Diagnosis not present

## 2018-09-07 DIAGNOSIS — M48061 Spinal stenosis, lumbar region without neurogenic claudication: Secondary | ICD-10-CM | POA: Diagnosis present

## 2018-09-07 DIAGNOSIS — G4700 Insomnia, unspecified: Secondary | ICD-10-CM | POA: Diagnosis present

## 2018-09-07 DIAGNOSIS — E669 Obesity, unspecified: Secondary | ICD-10-CM | POA: Diagnosis not present

## 2018-09-07 DIAGNOSIS — M62838 Other muscle spasm: Secondary | ICD-10-CM

## 2018-09-07 DIAGNOSIS — C9000 Multiple myeloma not having achieved remission: Secondary | ICD-10-CM | POA: Diagnosis present

## 2018-09-07 DIAGNOSIS — X58XXXA Exposure to other specified factors, initial encounter: Secondary | ICD-10-CM | POA: Diagnosis present

## 2018-09-07 DIAGNOSIS — T380X5A Adverse effect of glucocorticoids and synthetic analogues, initial encounter: Secondary | ICD-10-CM | POA: Diagnosis present

## 2018-09-07 DIAGNOSIS — Z6835 Body mass index (BMI) 35.0-35.9, adult: Secondary | ICD-10-CM

## 2018-09-07 DIAGNOSIS — N179 Acute kidney failure, unspecified: Secondary | ICD-10-CM | POA: Diagnosis not present

## 2018-09-07 DIAGNOSIS — Z79899 Other long term (current) drug therapy: Secondary | ICD-10-CM | POA: Diagnosis not present

## 2018-09-07 DIAGNOSIS — R509 Fever, unspecified: Secondary | ICD-10-CM

## 2018-09-07 DIAGNOSIS — R110 Nausea: Secondary | ICD-10-CM | POA: Diagnosis not present

## 2018-09-07 DIAGNOSIS — G479 Sleep disorder, unspecified: Secondary | ICD-10-CM

## 2018-09-07 DIAGNOSIS — E1129 Type 2 diabetes mellitus with other diabetic kidney complication: Secondary | ICD-10-CM | POA: Diagnosis not present

## 2018-09-07 DIAGNOSIS — D649 Anemia, unspecified: Secondary | ICD-10-CM

## 2018-09-07 DIAGNOSIS — M545 Low back pain: Secondary | ICD-10-CM

## 2018-09-07 DIAGNOSIS — Z833 Family history of diabetes mellitus: Secondary | ICD-10-CM

## 2018-09-07 DIAGNOSIS — Z9221 Personal history of antineoplastic chemotherapy: Secondary | ICD-10-CM

## 2018-09-07 DIAGNOSIS — Z91013 Allergy to seafood: Secondary | ICD-10-CM | POA: Diagnosis not present

## 2018-09-07 DIAGNOSIS — R066 Hiccough: Secondary | ICD-10-CM | POA: Diagnosis not present

## 2018-09-07 DIAGNOSIS — M4854XD Collapsed vertebra, not elsewhere classified, thoracic region, subsequent encounter for fracture with routine healing: Secondary | ICD-10-CM | POA: Diagnosis present

## 2018-09-07 DIAGNOSIS — R5381 Other malaise: Secondary | ICD-10-CM | POA: Diagnosis present

## 2018-09-07 DIAGNOSIS — D709 Neutropenia, unspecified: Secondary | ICD-10-CM | POA: Diagnosis not present

## 2018-09-07 DIAGNOSIS — M62830 Muscle spasm of back: Secondary | ICD-10-CM | POA: Diagnosis present

## 2018-09-07 DIAGNOSIS — R7309 Other abnormal glucose: Secondary | ICD-10-CM

## 2018-09-07 DIAGNOSIS — R030 Elevated blood-pressure reading, without diagnosis of hypertension: Secondary | ICD-10-CM

## 2018-09-07 DIAGNOSIS — E871 Hypo-osmolality and hyponatremia: Secondary | ICD-10-CM | POA: Diagnosis present

## 2018-09-07 DIAGNOSIS — E876 Hypokalemia: Secondary | ICD-10-CM | POA: Diagnosis present

## 2018-09-07 DIAGNOSIS — E1169 Type 2 diabetes mellitus with other specified complication: Secondary | ICD-10-CM | POA: Diagnosis not present

## 2018-09-07 DIAGNOSIS — E8809 Other disorders of plasma-protein metabolism, not elsewhere classified: Secondary | ICD-10-CM | POA: Diagnosis present

## 2018-09-07 DIAGNOSIS — Z9109 Other allergy status, other than to drugs and biological substances: Secondary | ICD-10-CM

## 2018-09-07 DIAGNOSIS — Z791 Long term (current) use of non-steroidal anti-inflammatories (NSAID): Secondary | ICD-10-CM | POA: Diagnosis not present

## 2018-09-07 DIAGNOSIS — D630 Anemia in neoplastic disease: Secondary | ICD-10-CM | POA: Diagnosis present

## 2018-09-07 DIAGNOSIS — E119 Type 2 diabetes mellitus without complications: Secondary | ICD-10-CM | POA: Diagnosis present

## 2018-09-07 DIAGNOSIS — R0989 Other specified symptoms and signs involving the circulatory and respiratory systems: Secondary | ICD-10-CM | POA: Diagnosis not present

## 2018-09-07 DIAGNOSIS — K5900 Constipation, unspecified: Secondary | ICD-10-CM | POA: Diagnosis present

## 2018-09-07 DIAGNOSIS — X58XXXD Exposure to other specified factors, subsequent encounter: Secondary | ICD-10-CM | POA: Diagnosis present

## 2018-09-07 DIAGNOSIS — M4856XD Collapsed vertebra, not elsewhere classified, lumbar region, subsequent encounter for fracture with routine healing: Secondary | ICD-10-CM | POA: Diagnosis present

## 2018-09-07 LAB — CBC WITH DIFFERENTIAL/PLATELET
Abs Immature Granulocytes: 0.04 10*3/uL (ref 0.00–0.07)
Basophils Absolute: 0 10*3/uL (ref 0.0–0.1)
Basophils Relative: 0 %
Eosinophils Absolute: 0 10*3/uL (ref 0.0–0.5)
Eosinophils Relative: 0 %
HCT: 22.5 % — ABNORMAL LOW (ref 39.0–52.0)
Hemoglobin: 7.2 g/dL — ABNORMAL LOW (ref 13.0–17.0)
Immature Granulocytes: 1 %
Lymphocytes Relative: 19 %
Lymphs Abs: 0.8 10*3/uL (ref 0.7–4.0)
MCH: 29.9 pg (ref 26.0–34.0)
MCHC: 32 g/dL (ref 30.0–36.0)
MCV: 93.4 fL (ref 80.0–100.0)
Monocytes Absolute: 0.2 10*3/uL (ref 0.1–1.0)
Monocytes Relative: 5 %
Neutro Abs: 2.9 10*3/uL (ref 1.7–7.7)
Neutrophils Relative %: 75 %
Platelets: 191 10*3/uL (ref 150–400)
RBC: 2.41 MIL/uL — ABNORMAL LOW (ref 4.22–5.81)
RDW: 13.8 % (ref 11.5–15.5)
WBC: 3.9 10*3/uL — ABNORMAL LOW (ref 4.0–10.5)
nRBC: 0 % (ref 0.0–0.2)

## 2018-09-07 LAB — BASIC METABOLIC PANEL
Anion gap: 8 (ref 5–15)
BUN: 37 mg/dL — ABNORMAL HIGH (ref 6–20)
CO2: 21 mmol/L — ABNORMAL LOW (ref 22–32)
Calcium: 8.5 mg/dL — ABNORMAL LOW (ref 8.9–10.3)
Chloride: 102 mmol/L (ref 98–111)
Creatinine, Ser: 1.34 mg/dL — ABNORMAL HIGH (ref 0.61–1.24)
GFR calc Af Amer: >60 mL/min (ref >60)
GFR calc non Af Amer: >60 mL/min (ref >60)
Glucose, Bld: 174 mg/dL — ABNORMAL HIGH (ref 70–99)
Potassium: 3.7 mmol/L (ref 3.5–5.1)
Sodium: 131 mmol/L — ABNORMAL LOW (ref 135–145)

## 2018-09-07 LAB — GLUCOSE, CAPILLARY
Glucose-Capillary: 167 mg/dL — ABNORMAL HIGH (ref 70–99)
Glucose-Capillary: 150 mg/dL — ABNORMAL HIGH (ref 70–99)
Glucose-Capillary: 117 mg/dL — ABNORMAL HIGH (ref 70–99)
Glucose-Capillary: 91 mg/dL (ref 70–99)

## 2018-09-07 MED ORDER — ACETAMINOPHEN 325 MG PO TABS
650.0000 mg | ORAL_TABLET | Freq: Four times a day (QID) | ORAL | Status: DC | PRN
  Administered 2018-09-07 – 2018-09-22 (×18): 650 mg via ORAL
  Filled 2018-09-07 (×19): qty 2

## 2018-09-07 MED ORDER — SENNOSIDES-DOCUSATE SODIUM 8.6-50 MG PO TABS
1.0000 | ORAL_TABLET | Freq: Two times a day (BID) | ORAL | Status: DC
  Administered 2018-09-07 – 2018-09-25 (×33): 1 via ORAL
  Filled 2018-09-07 (×36): qty 1

## 2018-09-07 MED ORDER — SORBITOL 70 % SOLN: 30.0000 mL | Freq: Every day | Status: DC | PRN

## 2018-09-07 MED ORDER — OXYCODONE HCL 5 MG PO TABS
5.0000 mg | ORAL_TABLET | Freq: Three times a day (TID) | ORAL | Status: DC | PRN
  Administered 2018-09-07 – 2018-09-16 (×23): 5 mg via ORAL
  Filled 2018-09-07 (×26): qty 1

## 2018-09-07 MED ORDER — CYCLOBENZAPRINE HCL 5 MG PO TABS: 5.0000 mg | ORAL_TABLET | Freq: Three times a day (TID) | ORAL | Status: DC | PRN

## 2018-09-07 MED ORDER — ASPIRIN 81 MG PO CHEW
81.0000 mg | CHEWABLE_TABLET | Freq: Every day | ORAL | Status: DC
  Administered 2018-09-08 – 2018-09-25 (×18): 81 mg via ORAL
  Filled 2018-09-07 (×18): qty 1

## 2018-09-07 MED ORDER — CYCLOBENZAPRINE HCL 5 MG PO TABS
5.0000 mg | ORAL_TABLET | Freq: Three times a day (TID) | ORAL | Status: DC
  Administered 2018-09-07 – 2018-09-17 (×29): 5 mg via ORAL
  Filled 2018-09-07 (×29): qty 1

## 2018-09-07 MED ORDER — PANTOPRAZOLE SODIUM 40 MG PO TBEC
40.0000 mg | DELAYED_RELEASE_TABLET | Freq: Every day | ORAL | Status: DC
  Administered 2018-09-08 – 2018-09-25 (×18): 40 mg via ORAL
  Filled 2018-09-07 (×19): qty 1

## 2018-09-07 MED ORDER — POLYETHYLENE GLYCOL 3350 17 G PO PACK
17.0000 g | PACK | Freq: Every day | ORAL | Status: DC
  Administered 2018-09-10 – 2018-09-25 (×16): 17 g via ORAL
  Filled 2018-09-07 (×18): qty 1

## 2018-09-07 MED ORDER — ENOXAPARIN SODIUM 40 MG/0.4ML ~~LOC~~ SOLN: 40.0000 mg | SUBCUTANEOUS | Status: DC

## 2018-09-07 MED ORDER — ACYCLOVIR 400 MG PO TABS
400.0000 mg | ORAL_TABLET | Freq: Two times a day (BID) | ORAL | Status: DC
  Administered 2018-09-07 – 2018-09-25 (×36): 400 mg via ORAL
  Filled 2018-09-07 (×37): qty 1

## 2018-09-07 MED ORDER — ENOXAPARIN SODIUM 40 MG/0.4ML ~~LOC~~ SOLN
40.0000 mg | SUBCUTANEOUS | Status: DC
  Administered 2018-09-07 – 2018-09-24 (×17): 40 mg via SUBCUTANEOUS
  Filled 2018-09-07 (×18): qty 0.4

## 2018-09-07 MED ORDER — INSULIN ASPART 100 UNIT/ML ~~LOC~~ SOLN
0.0000 [IU] | Freq: Three times a day (TID) | SUBCUTANEOUS | Status: DC
  Administered 2018-09-10 – 2018-09-12 (×3): 1 [IU] via SUBCUTANEOUS
  Administered 2018-09-13 (×2): 2 [IU] via SUBCUTANEOUS
  Administered 2018-09-13 – 2018-09-19 (×4): 1 [IU] via SUBCUTANEOUS
  Administered 2018-09-20: 3 [IU] via SUBCUTANEOUS
  Administered 2018-09-20 (×2): 2 [IU] via SUBCUTANEOUS
  Administered 2018-09-21: 13:00:00 1 [IU] via SUBCUTANEOUS
  Administered 2018-09-21 – 2018-09-22 (×3): 2 [IU] via SUBCUTANEOUS
  Administered 2018-09-22: 3 [IU] via SUBCUTANEOUS
  Administered 2018-09-23 (×2): 2 [IU] via SUBCUTANEOUS
  Administered 2018-09-24 (×2): 1 [IU] via SUBCUTANEOUS
  Administered 2018-09-24: 2 [IU] via SUBCUTANEOUS

## 2018-09-07 MED ORDER — METFORMIN HCL 500 MG PO TABS
500.0000 mg | ORAL_TABLET | Freq: Two times a day (BID) | ORAL | Status: DC
  Administered 2018-09-07 – 2018-09-13 (×9): 500 mg via ORAL
  Filled 2018-09-07 (×11): qty 1

## 2018-09-07 MED ORDER — METFORMIN HCL 500 MG PO TABS: 500.0000 mg | ORAL_TABLET | Freq: Two times a day (BID) | ORAL | 0 refills | Status: DC

## 2018-09-07 NOTE — Progress Notes (Signed)
New Admission Note:   Arrival Method: stretcher Mental Orientation: alert and oriented  Telemetry: none  Assessment: Completed Skin: Intact  IR:WERX AC saline loc  Pain: 7/10 oxycodone given 5mg   Tubes: none  Safety Measures: Safety Fall Prevention Plan has been given, discussed and signed Admission: Completed [facility] [place] Orientation: Patient has been orientated to the room, unit and staff.  Family: none  Meds: whole  Transfer: assist  Diet:Carb mod   Orders have been reviewed and implemented. Will continue to monitor the patient. Call light has been placed within reach and bed alarm has been activated.   [facility] RN [facility] [facility] Phone: [phone]

## 2018-09-07 NOTE — Telephone Encounter (Signed)
Oral Oncology Patient Advocate Encounter  Revlimid copay is [phone], this is not affordable for the patient. The patient has Pharmacist, community, therefore will need to apply for copay assistance with [employer]'s copay assistance program.   I called the patient and he agreed to apply for copay assistance. The patient requested for me contact him by email if possible as this would be easier for him. If [employer] needs a patient signature he can sign and email back.  I contacted Debby, our [employer] representative and she said that if the patient could call her she would sign him up over the phone and the patient would not have to sign the [employer] form.  I emailed the patient this information and will keep close contact with Debby to make sure the patient gets signed up for assistance.  This encounter will be updated until final determination.  Edward Todd Patient [place] Phone [phone] Fax [phone] 09/07/2018   1:41 PM

## 2018-09-07 NOTE — Progress Notes (Signed)
Meredith Staggers, MD  Physician  Physical Medicine and Rehabilitation  PMR Pre-admission  Signed  Date of Service:  09/06/2018 4:20 PM       Related encounter: ED to Hosp-Admission (Discharged) from 08/30/2018 in [place]      Signed         Show:Clear all _0 Manual_1 Template_2 Copied  Added by: _3 Meredith Staggers, MD_4 Michel Santee, PT  _5 Hover for details PMR Admission Coordinator Pre-Admission Assessment  Patient: Edward Todd is an 43 y.o., male MRN: [id number] DOB: 1976-04-15 Height: 6' 3.51" (191.8 cm) Weight: 130.7 kg  Insurance Information HMO:     PPO: yes    PCP:      IPA:      80/20:      OTHER:  PRIMARY: [employer]      Policy#: [id number]      Subscriber: patient CM Name: Almyra Free      Phone#: [phone]     Fax#: [phone] Pre-Cert#: [id number] Authorization provided by Almyra Free for admission 5/15 through 5/17 with updates due 5/18 to [employer] (phone: [phone], fax [phone])     Employer: [employer] Benefits:  Phone #: [phone]     Name:  Irene Shipper. Date: 04/25/2018     Deduct: $4000 (met)      Out of Pocket Max: [phone] (met [phone])      Life Max: n/a CIR: 70%      SNF: 70% Outpatient: 70%     Co-Pay:  Home Health: 70%      Co-Pay:  DME: 70%     Co-Pay:  Providers: in network  SECONDARY:       Policy#:       Subscriber:  CM Name:       Phone#:      Fax#:  Pre-Cert#:       Employer:  Benefits:  Phone #:      Name:  Eff. Date:      Deduct:       Out of Pocket Max:       Life Max:  CIR:       SNF:  Outpatient:      Co-Pay:  Home Health:       Co-Pay:  DME:      Co-Pay:   Medicaid Application Date:       Case Manager:  Disability Application Date:       Case Worker:   The Data Collection Information Summary for patients in Inpatient Rehabilitation Facilities with attached Privacy Act [place] Records was provided and verbally reviewed with:  Patient  Emergency Contact Information         Contact Information    Name Relation Home Work Mobile   Arreguin,Mary  [phone]     No,Contact  [phone]        Current Medical History  Patient Admitting Diagnosis: multiple myeloma  History of Present Illness: Edward Todd is a 43 year old right-handed male with with history of type II diabetes mellitus, however he had stopped taking his metformin sometime ago. By report patient had an accident at work March 2020 where an oxygen tank fell on his back at the lower lumbar/upper sacral area. He went to [facility] And the physician ordered muscle relaxer and pain medications but did not receive any imaging or further work-up. Since March he has been out of work walking with a cane with decreasing functional mobility. No bowel or bladder disturbances. He does report being treated for pneumonia approximately  1 month ago. Presented 08/30/2018 with increasing back pain and inability to ambulate. 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 Subarticular zone narrowing could affect both L5 nerve roots. Chronic appearing deformities of L2 and L4 but no acute or subacute bone marrow edema to suggest a recent injury. Diffusely abnormal bone marrow signal, with subcentimeter foci of postcontrast enhancement. This felt could possibly be related to severe anemia, body habitus but a marrow replacement process could not be excluded.  Multiple compression fractures noted in thoracic and lumbar spine.  Orthopedics consulted and recommended TLSO for conservative management of compression fractures. Hematology service was consulted with noted hemoglobin 7.6, RBCs 2.52 as well as hyperproteinemia.. A bone survey showed lesions in the scapula bilaterally suggesting possible multiple myeloma. IR was consulted for possible  vertebroplasty/kyphoplasty however this was not recommended per IR team. A bone marrow biopsy was performed consistent with multiple myeloma. He was started on weekly Velcade and dexamethasone 09/05/2018. Subcutaneous Lovenox for DVT prophylaxis. Findings of elevated hemoglobin A1c of 9.4 placed on sliding scale insulin.  Patient's medical record from [facility] has been reviewed by the rehabilitation admission coordinator and physician.  Past Medical History  History reviewed. No pertinent past medical history.  Family History   family history includes Diabetes in his maternal grandmother and paternal grandfather.  Prior Rehab/Hospitalizations Has the patient had prior rehab or hospitalizations prior to admission? No  Has the patient had major surgery during 100 days prior to admission? Yes, bone marrow biopsy             Current Medications  Current Facility-Administered Medications:    acyclovir (ZOVIRAX) tablet 400 mg, 400 mg, Oral, BID, Wofford, Cindie Laroche, RPH, 400 mg at 09/07/18 1032   aspirin chewable tablet 81 mg, 81 mg, Oral, Daily, Eugenie Filler, MD, 81 mg at 09/07/18 1031   cyclobenzaprine (FLEXERIL) tablet 5 mg, 5 mg, Oral, TID PRN, Georgette Shell, MD, 5 mg at 09/07/18 1224   enoxaparin (LOVENOX) injection 40 mg, 40 mg, Subcutaneous, Q24H, Eugenie Filler, MD, 40 mg at 09/06/18 1804   hydrALAZINE (APRESOLINE) injection 5 mg, 5 mg, Intravenous, Q4H PRN, Georgette Shell, MD, 5 mg at 08/31/18 3299   HYDROmorphone (DILAUDID) injection 0.5 mg, 0.5 mg, Intravenous, Q3H PRN, Georgette Shell, MD, 0.5 mg at 09/07/18 1035   insulin aspart (novoLOG) injection 0-9 Units, 0-9 Units, Subcutaneous, TID WC, Sheikh, Omair Lavalette, DO, 2 Units at 09/06/18 1824   oxyCODONE (Oxy IR/ROXICODONE) immediate release tablet 5 mg, 5 mg, Oral, Q8H PRN, Sheikh, Omair Latif, DO, 5 mg at 09/07/18 1224   pantoprazole (PROTONIX) EC tablet 40 mg, 40 mg, Oral,  Q0600, Eugenie Filler, MD, 40 mg at 09/07/18 0524   polyethylene glycol (MIRALAX / GLYCOLAX) packet 17 g, 17 g, Oral, Daily, Curcio, Kristin R, NP, 17 g at 09/07/18 1031   senna-docusate (Senokot-S) tablet 1 tablet, 1 tablet, Oral, BID, Eugenie Filler, MD, 1 tablet at 09/07/18 1032  Patients Current Diet:     Diet Order                  Diet Carb Modified Fluid consistency: Thin; Room service appropriate? Yes  Diet effective now               Precautions / Restrictions Precautions Precautions: Fall, Back Precaution Comments: back precautions/logroll Restrictions Weight Bearing Restrictions: No Other Position/Activity  Restrictions: T3, T6 - T 10 path Fx   Has the patient had 2 or more falls or a fall with injury in the past year? 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 Independent  Indoor Mobility: Did the patient need assistance with walking from room to room (with or without device)? Independent  Stairs: Did the patient need assistance with internal or external stairs (with or without device)? Independent  Functional Cognition: Did the patient need help planning regular tasks such as shopping or remembering to take medications? [place] Devices/Equipment: Cane (specify quad or straight) Home Equipment: Cane - single point  Prior Device Use: Indicate devices/aids used by the patient prior to current illness, exacerbation or injury? had been using a cane immediately PTA, but before injury in March was independent with AD  Current Functional Level Cognition  Overall Cognitive Status: Within Functional Limits for tasks assessed    Extremity Assessment (includes Sensation/Coordination)  Upper Extremity Assessment:  Overall WFL for tasks assessed  Lower Extremity Assessment: Defer to PT evaluation RLE Deficits / Details: hip flex at least 3/5, knee ext at least 3+/5. DP/PF 5/5. Limited by pain as well LLE Deficits / Details: hip flex at least 3/5, knee ext at least 3+/5. DP/PF 5/5. Limited by pain as well    ADLs  Overall ADL's : Needs assistance/impaired Eating/Feeding: Minimal assistance, Moderate assistance, Sitting Eating/Feeding Details (indicate cue type and reason): sitting EOB, needs to use BUEs for support sitting EOB. Able to use one UE for few minutes, then requiring assist Grooming: Set up, Bed level Grooming Details (indicate cue type and reason): sitting EOB, increased time to be able to shift weight to hold TB with R hand. Needed management for water, spitting, wiping mouth. Pt having to stop x2 and keep TB in mouth and place RUE down on bed to brace for pain Upper Body Bathing: Set up, Bed level Upper Body Bathing Details (indicate cue type and reason): increased time and assist when sitting Lower Body Bathing: Maximal assistance, Bed level Upper Body Dressing : Minimal assistance, Bed level Lower Body Dressing: Total assistance, Sitting/lateral leans, Sit to/from stand, Adhering to back precautions Toilet Transfer: Total assistance, BSC, RW Toilet Transfer Details (indicate cue type and reason): currently using bed pan and urinal at bed level; OOB t/f not able to be safely attempted this date Toileting- Clothing Manipulation and Hygiene: Total assistance, Adhering to back precautions Tub/ Shower Transfer: Total assistance Functional mobility during ADLs: Total assistance General ADL Comments: performed adl at bed level prior to sitting up.  reinforced back precautions. Pt would benefit from AE    Mobility  Overal bed mobility: Needs Assistance Bed Mobility: Rolling, Sidelying to Sit, Sit to Sidelying Rolling: Mod assist, +2 for physical assistance, +2 for  safety/equipment Sidelying to sit: Max assist Sit to sidelying: Max assist, +2 for physical assistance, +2 for safety/equipment General bed mobility comments: MAX Assist and increased time due to back pain.  But improved self ability from last session.      Transfers  Overall transfer level: Needs assistance Equipment used: Bilateral platform walker, None Transfers: Sit to/from Stand, Stand Pivot Transfers Sit to Stand: +2 physical assistance, +2 safety/equipment, Mod assist Stand pivot transfers: Mod assist, +2 physical assistance, +2 safety/equipment General transfer comment: excessive use of B  UE's.  Assisted from elevated bed to [facility] then from [facility] to EVA walker to attemp gait.     Ambulation / Gait / Stairs / Wheelchair Mobility  Ambulation/Gait Ambulation/Gait assistance: Max assist, +2 physical assistance, +2 safety/equipment Gait Distance (Feet): 30 Feet Assistive device: Bilateral platform walker(EVA walker ) Gait Pattern/deviations: Step-to pattern, Decreased step length - left, Decreased step length - right, Trunk flexed General Gait Details: VERY weak B LE paraparesis with excessive lean on EVA walker.  Unable to support self with regular walker.  Fkex hips and knees.  + 3 assist such that recliner was following.   Gait velocity: decreased     Posture / Balance Balance Overall balance assessment: History of Falls, Needs assistance Sitting-balance support: Bilateral upper extremity supported, Feet supported Sitting balance-Leahy Scale: Poor Standing balance support: Bilateral upper extremity supported Standing balance-Leahy Scale: Zero    Special needs/care consideration BiPAP/CPAP no CPM no Continuous Drip IV: will receive IV chemotherapy 1x/week Dialysis no        Days n/a Life Vest no Oxygen no Special Bed no Trach Size no Wound Vac (area) no      Location n/a Skin intact                               Bowel mgmt: continent, last BM 09/03/2018 Bladder mgmt:  continent Diabetic mgmt: yes Behavioral consideration no Chemo/radiation yes, currently on IV Velcade and dexamethasone 1x/week, oral Revlimid QD.    Previous Home Environment (from acute therapy documentation) Living Arrangements: Spouse/significant other Available Help at Discharge: Family Type of Home: House Home Layout: One level Home Access: Stairs to enter Technical brewer of Steps: [place]: No  Discharge Living Setting Plans for Discharge Living Setting: Patient's home Type of Home at Discharge: House Discharge Home Layout: One level Discharge Home Access: Stairs to enter Entrance Stairs-Rails: None Entrance Stairs-Number of Steps: 1 Discharge Bathroom Shower/Tub: Tub/shower unit, Curtain Discharge Bathroom Toilet: Standard Discharge Bathroom Accessibility: Yes How Accessible: Accessible via walker Does the patient have any problems obtaining your medications?: No  Social/Family/Support Systems Patient Roles: Spouse, Parent Anticipated Caregiver: wife, Marguarite Arbour Merchant navy officer), 2 children, 96 y/o daughter Lauralyn Primes, and an 74 y/o son Anticipated Ambulance person Information: Marguarite Arbour [phone] (c) [phone] (h) Ability/Limitations of Caregiver: Marguarite Arbour is a Pharmacist, hospital, currently working from home Caregiver Availability: 24/7 Discharge Plan Discussed with Primary Caregiver: Yes Is Caregiver In Agreement with Plan?: Yes Does Caregiver/Family have Issues with Lodging/Transportation while Pt is in Rehab?: No  Goals/Additional Needs Patient/Family Goal for Rehab: PT/OT supervision/min assist Expected length of stay: 15-18 days Dietary Needs: carb modified, thin Equipment Needs: tbd Special Service Needs: IV chemo 1x/week per IV therapy and oral chemo daily (per Dr. Burr Medico with Oncology) Pt/Family Agrees to Admission and willing to participate: Yes Program Orientation Provided & Reviewed with Pt/Caregiver Including Roles  & Responsibilities: Yes   Possible  need for SNF placement upon discharge: not anticipated  Patient Condition: I have reviewed medical records from [facility], spoken with patient and spouse. I discussed via phone for inpatient rehabilitation assessment.  Patient will benefit from ongoing PT and OT, can actively participate in 3 hours of therapy a day 5 days of the week, and can make measurable gains during the admission.  Patient will also benefit from the coordinated team approach during an Inpatient Acute Rehabilitation admission.  The patient will receive intensive therapy as well as Rehabilitation physician, nursing, social  worker, and care management interventions.  Due to safety, skin/wound care, disease management, medication administration, pain management, patient education and IV chemotherapy the patient requires 24 hour a day rehabilitation nursing.  The patient is currently max+2 with mobility and basic ADLs.  Discharge setting and therapy post discharge at home with home health is anticipated.  Patient has agreed to participate in the Acute Inpatient Rehabilitation Program and will admit today.  Preadmission Screen Completed By:  Michel Santee, PT, DPT  09/07/2018 12:32 PM ______________________________________________________________________   Discussed status with Dr. Naaman Plummer on 09/07/18  at 12:32 PM  and received approval for admission today.  Admission Coordinator:  Michel Santee, PT, DPT time 12:32 PM Sudie Grumbling 09/07/18    Assessment/Plan: Diagnosis: Multiple myeloma with L2, L4 lesions, debility  1. Does the need for close, 24 hr/day Medical supervision in concert with the patient's rehab needs make it unreasonable for this patient to be served in a less intensive setting? Yes 2. Co-Morbidities requiring supervision/potential complications: anemia, pain, onc considerations 3. Due to bladder management, bowel management, safety, skin/wound care, disease management, medication administration, pain  management and patient education, does the patient require 24 hr/day rehab nursing? Yes 4. Does the patient require coordinated care of a physician, rehab nurse, PT (1-2 hrs/day, 5 days/week) and OT (1-2 hrs/day, 5 days/week) to address physical and functional deficits in the context of the above medical diagnosis(es)? Yes Addressing deficits in the following areas: balance, endurance, locomotion, strength, transferring, bowel/bladder control, bathing, dressing, feeding, grooming, toileting and psychosocial support 5. Can the patient actively participate in an intensive therapy program of at least 3 hrs of therapy 5 days a week? Yes 6. The potential for patient to make measurable gains while on inpatient rehab is excellent 7. Anticipated functional outcomes upon discharge from inpatients are: supervision and min assist PT, supervision and min assist OT, n/a SLP 8. Estimated rehab length of stay to reach the above functional goals is: 15-18 days 9. Anticipated D/C setting: Home 10. Anticipated post D/C treatments: Martha therapy 11. Overall Rehab/Functional Prognosis: excellent  MD Signature: Meredith Staggers, MD, [place] [facility] 09/07/2018         Revision History

## 2018-09-07 NOTE — Discharge Summary (Signed)
Physician Discharge Summary  Edward Todd JFH:[id number] DOB: May 13, 1975 DOA: 08/30/2018  PCP: Patient, No Pcp Per  Admit date: 08/30/2018 Discharge date: 09/07/2018  Time spent: 50 minutes  Recommendations for Outpatient Follow-up:  1. Patient will be discharged to inpatient rehab.  Follow-up with MD at inpatient rehab. 2. Follow-up with PCP 1 to 2 weeks post discharge from inpatient rehab.  On follow-up with PCP patient's diabetes will need to be reassessed.   Discharge Diagnoses:  Principal Problem:   Multiple myeloma ([place]) Active Problems:   Symptomatic anemia   HTN (hypertension)   Type 2 diabetes mellitus without complication (HCC)   Hypophosphatemia   Constipation   Anemia of chronic disease   Discharge Condition: Stable and improved.  Diet recommendation: Carb modified diet.  Filed Weights   09/03/18 0813  Weight: 130.7 kg    History of present illness:  Per Dr. Ames Dura is a 43 y.o. male with no significant past medical history had an accident at work in March 2020 where the oxygen tank fell on his back at the lower lumbar upper sacral area he went to [place] and ordered muscle relaxant and pain pills but has not had any imaging done at that time.  Since March she has been out of work walking with a cane but today he was unable to get out of bed unable to walk even 1 step.  No urinary complaints no bowel complaints no nausea vomiting diarrhea fever chills abdominal pain chest pain shortness of breath cough headaches.  No complaints of hematemesis hematuria blood in the urine blood in stool.  He reports being treated for pneumonia over a month ago.  No complaints of pneumonia at this time or symptoms of pneumonia at this time.  No syncope but feels dizzy upon standing.    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 Subarticular zone narrowing could affect both L5 nerve roots. 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  Chronic appearing deformities of L2 and L4, but no acute or subacute bone marrow edema to suggest a recent injury.  Diffusely abnormal bone marrow signal, with subcentimeter foci of postcontrast enhancement. This could be related to severe anemia, or body habitus, but a marrow replacement process cannot completely be excluded. Hematologic consultation may be warranted.  Hemoglobin 7.6 from 14.8 in 2017.  He has never been told he has anemia.  Creatinine 1.21 sodium 132 potassium 4.0 white count 4.9 hematocrit 23 platelet count 206.  Hospital Course:  1 newly diagnosed multiple myeloma Patient had presented with back pain, noted to be anemic with hemoglobin dropping as low as 7.3 with no signs or symptoms of bleeding.  MRI of the L-spine showed abnormal marrow signal suspicious for diffuse skeletal multiple myeloma.  Patient also noted to have some pathologic thoracic fractures of T3 and T6-T10 but no associated marrow edema or complicating features.  Bone survey done showed lesions in the scapula bilaterally that can be seen with multiple myeloma.  IR was consulted for vertebroplasty/kyphoplasty however this was not recommended per them.  Patient's pain currently well controlled.  SPEP done showed a M protein of 5.7 g/dL, kappa lambda light chain ratio elevated at 103.81.  Kappa free light chain was elevated at 664.4.  IgG noted to be elevated at 10/02/2007.  Bone marrow biopsy was done with formal results pending however per oncology, bone marrow aspirate is consistent with multiple myeloma with 80-90 myeloma cells in the bone marrow.  Core biopsy pending with cytogenetics and FISH which has been ordered.  Patient being followed by oncology and patient has been started on weekly Velcade and  dexamethasone on 09/05/2018.  Patient received a dose of Zometa.  Patient to be started on Revlimid in the outpatient setting per oncology and prescription has been sent out per oncology.    Patient's pain was managed during the hospitalization.  Patient was seen by PT/OT.  Patient will be discharged to inpatient rehab.  Per oncology okay to repeat labs twice a week.  Oncology will follow patient during his stay at inpatient rehab.    2.  Anemia of chronic disease  likely secondary to problem #1.  Patient with no overt bleeding.  Hemoglobin remained stable between 7-8.  On day of discharge hemoglobin was 7.2.  Currently at 7.9.  Transfusion threshold hemoglobin less than 7.  Oncology recommending twice weekly labs.   3.  Thoracic compression fractures Likely secondary to problem #1.  Thoracic spine MRI showed diffusely abnormal bone marrow signal highly suspicious of diffuse skeletal multiple myeloma.  Pathologic mild to moderate thoracic compression fractures T3 and 6 through T10 but no associated marrow edema complicating features.  IR reviewed MRI and was felt patient was not a candidate for kyphoplasty or vertebroplasty.  Case was discussed by Dr. Alfredia Ferguson with orthopedics Dr. Lorin Mercy who recommended no TLSO brace as fractures noted not to be new and recommended outpatient follow-up with orthopedics.  PT/OT.  4.  Hypertension Blood pressure remained stable throughout the hospitalization.  Outpatient follow-up with PCP.   5.  Hypophosphatemia Repleted.  6.  Obesity  7.  Type 2 diabetes mellitus Hemoglobin A1c noted to be 9.4.  Hemoglobin A1c in 2009 was 6.1.  CBG was less than 200 during the hospitalization and patient maintained on sliding scale insulin.  Patient also placed on a carb modified diet.  Patient stated was on metformin 500 mg twice daily before in the past however stopped taking them several months ago and realizes he needs to be compliant with his medications.  Will need to be  started on oral hypoglycemic agents on discharge preferably metformin 500 mg twice daily with close outpatient follow-up.  Patient was handed a prescription for metformin 500 mg twice daily prior to discharge to inpatient rehab.  Patient was also followed by diabetic coordinator throughout the hospitalization.    8.  Constipation Likely opiate induced.  Improved.    Patient maintained on a bowel regimen of Senokot-S twice daily as well as MiraLAX daily.  Outpatient follow-up.   9.  Dehydration Patient was hydrated with IV fluids.    Procedures:  Skeletal bone survey 08/31/2018  MRI L-spine 08/30/2018  MRI T-spine 09/01/2018  Bone marrow biopsy 09/04/2018  Consultations:  Medical Oncology  Interventional Radiology  Discussed Case with Orthopedic Surgery Dr. Lorin Mercy  Discharge Exam: Vitals:   09/06/18 2226 09/07/18 0535  BP: (!) 136/98 (!) 135/98  Pulse: 95 89  Resp: 20 20  Temp: 97.9 F (36.6 C) 97.9 F (36.6 C)  SpO2: 100% 99%    General: NAD Cardiovascular: RRR Respiratory: CTAB  Discharge Instructions     Allergies  Allergen Reactions  . Shellfish Allergy Rash      The results of significant diagnostics from this hospitalization (including imaging, microbiology, ancillary and laboratory) are listed below  for reference.    Significant Diagnostic Studies: Mr Thoracic Spine Wo Contrast  Result Date: 09/01/2018 CLINICAL DATA:  43 year old male with back pain and anemia, suspicion of multiple myeloma. EXAM: MRI THORACIC SPINE WITHOUT CONTRAST TECHNIQUE: Multiplanar, multisequence MR imaging of the thoracic spine was performed. No intravenous contrast was administered. COMPARISON:  Skeletal survey 08/31/2018.  Lumbar MRI 08/30/2018. FINDINGS: Limited cervical spine imaging: Heterogeneous bone marrow signal particularly in the mid and lower cervical vertebrae. Thoracic spine segmentation: Normal on the comparison radiographs. And this numbering system appears concordant  with that on the recent lumbar MRI. Alignment: Mildly exaggerated thoracic kyphosis. No spondylolisthesis. Vertebrae: Diffusely abnormal bone marrow signal throughout the thoracic spine and visible ribs, most apparent on series 9. Pathologic compression fractures of T3 (mild), and T6 through T10 (mild-to-moderate). However, no confluent marrow edema at these levels. No retropulsion of bone. Cord: Spinal cord signal is within normal limits at all visualized levels. Mostly visible conus medullaris at T12-L1 appears normal. There is mild thoracic epidural lipomatosis which is maximal at the T5 and T6 levels. Paraspinal and other soft tissues: Small or trace layering pleural effusions. Otherwise negative visible thoracic viscera. Grossly negative visible upper abdominal viscera. No paraspinal soft tissue edema. Disc levels: No significant superimposed degenerative changes. IMPRESSION: 1. Diffusely abnormal bone marrow signal highly suspicious for diffuse skeletal multiple myeloma. 2. Pathologic mild-to-moderate thoracic compression fractures T3 and T6 through T10, but no associated marrow edema or complicating features. 3. Normal thoracic spinal cord.  No spinal stenosis. 4. Trace layering pleural effusions. Electronically Signed   By: Genevie Ann M.D.   On: 09/01/2018 19:59   Mr Lumbar Spine W Wo Contrast (assess For Abscess, Cord Compression)  Result Date: 08/30/2018 CLINICAL DATA:  Heavy object fell patient on March 5th. Patient states now unable to move legs. Severe back pain. EXAM: MRI LUMBAR SPINE WITHOUT AND WITH CONTRAST TECHNIQUE: Multiplanar and multiecho pulse sequences of the lumbar spine were obtained without and with intravenous contrast. CONTRAST:  Gadavist 10 mL. COMPARISON:  No plain films or other cross-sectional imaging are available. FINDINGS: Segmentation:  Standard. Alignment: Straightening of the normal lumbar lordosis. No subluxation. Vertebrae: Chronic deformities of L2 and L4 with endplate  softening, but no acute fracture or bone marrow edema. Diffuse low signal intensity bone marrow T1 and T2 weighted images, nonspecific, potentially related to anemia or body habitus, but in the setting of endplate softening is somewhat more concerning. No retropulsion. 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 Postcontrast, diffuse punctate abnormal enhancement of the spinous processes, greater than vertebral bodies, uncertain significance. Conus medullaris and cauda equina: Conus extends to the L1 level. Conus and cauda equina appear normal. Paraspinal and other soft tissues: Renal cystic disease, non worrisome. Disc levels: T12-L1: Normal. L1-L2:  Normal disc space.  Mild facet arthropathy.  No impingement. L2-L3:  Normal disc space.  Mild facet arthropathy.  No impingement. L3-L4: Mild congenital stenosis, posterior element hypertrophy. Normal disc space. No impingement. 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 Short pedicles. Annular bulge. Annular rent extends to the LEFT extraforaminal compartment, and is mildly vascularized indicating chronicity. BILATERAL subarticular zone narrowing affects both L5 nerve roots. BILATERAL foraminal narrowing is not clearly compressive. L5-S1: Mild congenital stenosis. Mild facet arthropathy. No impingement. 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 Subarticular zone narrowing could affect both L5 nerve roots. Chronic annular rent at L4 on the  LEFT is observed in the extraforaminal compartment, and could irritate the LEFT L4 nerve root. Chronic appearing deformities of L2 and L4, but no acute or subacute bone marrow edema to suggest a recent injury. Diffusely abnormal bone marrow signal, with subcentimeter foci of postcontrast enhancement. This could be related to severe anemia, or body habitus, but a marrow replacement process  cannot completely be excluded. Hematologic consultation may be warranted. Electronically Signed   By: Staci Righter M.D.   On: 08/30/2018 15:22   Dg Bone Survey Met  Result Date: 08/31/2018 CLINICAL DATA:  Low back pain, history of multiple myeloma EXAM: METASTATIC BONE SURVEY COMPARISON:  None. FINDINGS: Small lucencies within the scapula bilaterally as can be seen with multiple myeloma. No other aggressive lytic or sclerotic osseous lesion. No periosteal reaction or bone destruction. No acute osseous abnormality. Chronic L1, L2 and L4 vertebral body compression fractures. Age-indeterminate T10, T9, T8, T6 vertebral body compression fractures. IMPRESSION: Small lucencies within the scapula bilaterally as can be seen with multiple myeloma. Age-indeterminate T6, T8, T9 and T10 vertebral body compression fractures. Electronically Signed   By: Kathreen Devoid   On: 08/31/2018 11:12    Microbiology: Recent Results (from the past 240 hour(s))  SARS Coronavirus 2 (CEPHEID - Performed in [place] hospital lab), Hosp Order     Status: None   Collection Time: 08/30/18  4:41 PM  Result Value Ref Range Status   SARS Coronavirus 2 NEGATIVE NEGATIVE Final    Comment: (NOTE) If result is NEGATIVE SARS-CoV-2 target nucleic acids are NOT DETECTED. The SARS-CoV-2 RNA is generally detectable in upper and lower  respiratory specimens during the acute phase of infection. The lowest  concentration of SARS-CoV-2 viral copies this assay can detect is 250  copies / mL. A negative result does not preclude SARS-CoV-2 infection  and should not be used as the sole basis for treatment or other  patient management decisions.  A negative result may occur with  improper specimen collection / handling, submission of specimen other  than nasopharyngeal swab, presence of viral mutation(s) within the  areas targeted by this assay, and inadequate number of viral copies  (<250 copies / mL). A negative result must be combined with  clinical  observations, patient history, and epidemiological information. If result is POSITIVE SARS-CoV-2 target nucleic acids are DETECTED. The SARS-CoV-2 RNA is generally detectable in upper and lower  respiratory specimens dur ing the acute phase of infection.  Positive  results are indicative of active infection with SARS-CoV-2.  Clinical  correlation with patient history and other diagnostic information is  necessary to determine patient infection status.  Positive results do  not rule out bacterial infection or co-infection with other viruses. If result is PRESUMPTIVE POSTIVE SARS-CoV-2 nucleic acids MAY BE PRESENT.   A presumptive positive result was obtained on the submitted specimen  and confirmed on repeat testing.  While 2019 novel coronavirus  (SARS-CoV-2) nucleic acids may be present in the submitted sample  additional confirmatory testing may be necessary for epidemiological  and / or clinical management purposes  to differentiate between  SARS-CoV-2 and other Sarbecovirus currently known to infect humans.  If clinically indicated additional testing with an alternate test  methodology [phone]) is advised. The SARS-CoV-2 RNA is generally  detectable in upper and lower respiratory sp ecimens during the acute  phase of infection. The expected result is Negative. Fact Sheet for Patients:  StrictlyIdeas.no Fact Sheet for Healthcare Providers: BankingDealers.co.za This test is not yet approved  or cleared by the [place] and has been authorized for detection and/or diagnosis of SARS-CoV-2 by FDA under an Emergency Use Authorization (EUA).  This EUA will remain in effect (meaning this test can be used) for the duration of the COVID-19 declaration under Section 564(b)(1) of the Act, 21 U.S.C. section 360bbb-3(b)(1), unless the authorization is terminated or revoked sooner. Performed at [facility],  [place] [address] [place], [place] [address]      Labs: Basic Metabolic Panel: Recent Labs  Lab 09/02/18 0508 09/03/18 0517 09/04/18 0518 09/05/18 0727 09/06/18 0614 09/07/18 0654  NA 132* 130* 132* 132* 129* 131*  K 3.6 3.6 3.6 3.8 3.9 3.7  CL 97* 98 98 96* 99 102  CO2 _0 19* 21*  GLUCOSE 113* 119* 108* 124* 186* 174*  BUN _1 25* 30* 37*  CREATININE 1.28* 1.29* 1.28* 1.42* 1.44* 1.34*  CALCIUM 9.5 9.7 10.3 10.2 9.5 8.5*  MG 1.9 1.9 1.9 2.0 1.9  --   PHOS 4.3 4.1 4.5 3.7 3.7  --    Liver Function Tests: Recent Labs  Lab 09/02/18 0508 09/03/18 0517 09/04/18 0518 09/05/18 0727 09/06/18 0614  AST _2 ALT _3 ALKPHOS 49 50 51 56 51  BILITOT 0.4 0.3 0.4 0.4 0.6  PROT 11.7* >12.0* >12.0* >12.0* >12.0*  ALBUMIN 2.5* 2.7* 2.7* 2.8* 2.8*   No results for input(s): LIPASE, AMYLASE in the last 168 hours. No results for input(s): AMMONIA in the last 168 hours. CBC: Recent Labs  Lab 09/03/18 0517 09/04/18 0518 09/05/18 0727 09/06/18 0614 09/07/18 0654  WBC 3.3* 4.3 4.4 6.0 3.9*  NEUTROABS 1.5* 1.7 1.6* 3.7 2.9  HGB 7.3* 7.8* 8.0* 7.9* 7.2*  HCT 22.1* 23.2* 24.3* 23.5* 22.5*  MCV 90.2 90.3 90.3 90.0 93.4  PLT 204 208 212 210 191   Cardiac Enzymes: No results for input(s): CKTOTAL, CKMB, CKMBINDEX, TROPONINI in the last 168 hours. BNP: BNP (last 3 results) No results for input(s): BNP in the last 8760 hours.  ProBNP (last 3 results) No results for input(s): PROBNP in the last 8760 hours.  CBG: Recent Labs  Lab 09/06/18 1203 09/06/18 1819 09/06/18 2228 09/07/18 0809 09/07/18 1143  GLUCAP 167* 191* 139* 150* 117*       Signed:  Irine Seal MD.  [facility] 09/07/2018, 1:16 PM

## 2018-09-07 NOTE — Telephone Encounter (Signed)
Oral Oncology Patient Advocate Encounter  I emailed the application that I completed for the patient to Debby at [place], today.  [place] Patient [place] Phone [phone] Fax [phone] 09/07/2018   2:44 PM

## 2018-09-07 NOTE — H&P (Signed)
Physical Medicine and Rehabilitation Admission H&P        Chief Complaint  Patient presents with   Back Pain  Chief complaint: Back pain HPI: Edward Todd is a 43 year old right-handed male with with history of type II  diabetes mellitus however he had stopped taking his metformin sometime ago.  By report patient had an accident at work March 2020 where the oxygen tank fell on his back at the lower lumbar upper sacral area.  He went to [facility] physician ordered muscle relaxer and pain medications but did not receive any imaging or further work-up.  Since March he has been out of work walking with a cane with decreasing functional mobility.  No bowel or bladder disturbances.  He does report being treated for pneumonia approximately 1 month ago.  Per chart review lives with his spouse.  One level home one-step to entry.  Presented 08/30/2018 with increasing back pain.  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  Subarticular zone narrowing could affect both L5 nerve roots.  Chronic appearing deformities of L2 and L4 but no acute or subacute bone marrow edema to suggest a recent injury.  Diffusely abnormal bone marrow signal, with subcentimeter foci of postcontrast enhancement.  This felt could possibly be related to severe anemia, body habitus but a marrow replacement process could not be excluded.  Hematology service is consulted with noted hemoglobin 7.6, RBCs 2.52 as well as hyperproteinemia..  A bone survey showed lesions in the scapula bilaterally suggesting possible multiple myeloma.  IR was consulted for possible vertebroplasty kyphoplasty however this was not recommended per IR team.  A bone marrow biopsy was performed consistent with multiple myeloma.  He was started on weekly Velcade and dexamethasone 09/05/2018.  Subcutaneous Lovenox for DVT prophylaxis.  Findings of  elevated hemoglobin A1c of 9.4 placed on sliding scale insulin.  Therapy evaluations completed and patient was admitted for a comprehensive rehab program.   Review of Systems  Constitutional: Negative for chills and fever.  HENT: Negative for hearing loss.   Eyes: Negative for blurred vision and double vision.  Respiratory: Positive for shortness of breath.   Cardiovascular: Negative for chest pain, palpitations and leg swelling.  Gastrointestinal: Positive for constipation. Negative for heartburn, nausea and vomiting.  Genitourinary: Positive for urgency. Negative for dysuria, flank pain and hematuria.  Musculoskeletal: Positive for back pain and myalgias.  Skin: Negative for rash.  Neurological: Negative for weakness.  All other systems reviewed and are negative.   History reviewed. No pertinent past medical history. History reviewed. No pertinent surgical history.      Family History  Problem Relation Age of Onset   Diabetes Maternal Grandmother     Diabetes Paternal Grandfather      Social History:  reports that he has never smoked. He has never used smokeless tobacco. He reports that he does not drink alcohol or use drugs. Allergies:      Allergies  Allergen Reactions   Shellfish Allergy Rash          Medications Prior to Admission  Medication Sig Dispense Refill   Ascorbic Acid (VITAMIN C PO) Take 1 tablet by mouth daily.       Cyanocobalamin (B-12 PO) Take 1 tablet by mouth daily.       meloxicam (MOBIC) 15 MG tablet Take 7.5-15 mg by mouth daily.  methocarbamol (ROBAXIN) 500 MG tablet Take 500-1,000 mg by mouth at bedtime as needed for muscle pain.       POTASSIUM PO Take 1 tablet by mouth daily.       valACYclovir (VALTREX) 1000 MG tablet Take 1,000 mg by mouth daily as needed (cold sores).        albuterol (PROVENTIL HFA;VENTOLIN HFA) 108 (90 Base) MCG/ACT inhaler Inhale 2 puffs into the lungs every 4 (four) hours as needed for wheezing or shortness of  breath (cough, shortness of breath or wheezing.). (Patient not taking: Reported on 08/30/2018) 1 Inhaler 1      Drug Regimen Review Drug regimen was reviewed and remains appropriate with no significant issues identified   Home: Home Living Family/patient expects to be discharged to:: Private residence Living Arrangements: Spouse/significant other Available Help at Discharge: Family Type of Home: House Home Access: Stairs to enter Technical brewer of Steps: 1 Home Layout: One level [place]: [place] - single point   Functional History: Prior Function Level of Independence: Independent with assistive device(s) Comments: usine cane for functional mobility until 08/30/18 then had sudden increase and pain and decrease in function   Functional Status:  Mobility: Bed Mobility Overal bed mobility: Needs Assistance Bed Mobility: Rolling, Sidelying to Sit, Sit to Sidelying Rolling: Mod assist, +2 for physical assistance, +2 for safety/equipment Sidelying to sit: +2 for physical assistance, +2 for safety/equipment, Max assist Sit to sidelying: Max assist, +2 for physical assistance, +2 for safety/equipment General bed mobility comments: patient with increased time and effort during all transitional movements to prevent pain. Offered physical assist to patient with patient declining, also attempted to use gait belt to guide LE off edge of bed. Patient only able to come to half sitting at EOB with heavy use of UE for assist. Pain limiting movement. Oncology MD arriving at end of session Transfers Overall transfer level: Needs assistance Equipment used: Rolling walker (2 wheeled) Transfers: Sit to/from Stand Sit to Stand: From elevated surface, Mod assist, +2 physical assistance, +2 safety/equipment General transfer comment: unable Ambulation/Gait General Gait Details: NT-pt not yet safely able 2* pain/fall risk   ADL: ADL Overall ADL's : Needs assistance/impaired Eating/Feeding:  Minimal assistance, Moderate assistance, Sitting Eating/Feeding Details (indicate cue type and reason): sitting EOB, needs to use BUEs for support sitting EOB. Able to use one UE for few minutes, then requiring assist Grooming: Set up, Bed level Grooming Details (indicate cue type and reason): sitting EOB, increased time to be able to shift weight to hold TB with R hand. Needed management for water, spitting, wiping mouth. Pt having to stop x2 and keep TB in mouth and place RUE down on bed to brace for pain Upper Body Bathing: Set up, Bed level Upper Body Bathing Details (indicate cue type and reason): increased time and assist when sitting Lower Body Bathing: Maximal assistance, Bed level Upper Body Dressing : Minimal assistance, Bed level Lower Body Dressing: Total assistance, Sitting/lateral leans, Sit to/from stand, Adhering to back precautions Toilet Transfer: Total assistance, BSC, RW Toilet Transfer Details (indicate cue type and reason): currently using bed pan and urinal at bed level; OOB t/f not able to be safely attempted this date Toileting- Clothing Manipulation and Hygiene: Total assistance, Adhering to back precautions Tub/ Shower Transfer: Total assistance Functional mobility during ADLs: Total assistance General ADL Comments: performed adl at bed level prior to sitting up.  reinforced back precautions. Pt would benefit from AE   Cognition: Cognition Overall Cognitive Status: Within Functional  Limits for tasks assessed Cognition Arousal/Alertness: Awake/alert Behavior During Therapy: WFL for tasks assessed/performed Overall Cognitive Status: Within Functional Limits for tasks assessed   Physical Exam: Blood pressure (!) 150/82, pulse 95, temperature 97.9 F (36.6 C), temperature source Oral, resp. rate 19, height 6' 3.51" (1.918 m), weight 130.7 kg, SpO2 99 %. Physical Exam  Constitutional: He is oriented to person, place, and time. He appears well-developed.  HENT:    Head: Normocephalic and atraumatic.  Mouth/Throat: Oropharynx is clear and moist. No oropharyngeal exudate.  Eyes: Pupils are equal, round, and reactive to light. EOM are normal.  Neck: Normal range of motion. Neck supple. No thyromegaly present.  Cardiovascular: Normal rate. Exam reveals no friction rub.  Murmur heard. Respiratory: Effort normal. No respiratory distress. He has no wheezes. He has no rales.  GI: Soft. He exhibits no distension. There is no abdominal tenderness. There is no rebound.  Musculoskeletal: Normal range of motion.        General: No edema.     Comments: LB TTP and painful with proximal LE movement  Neurological: He is alert and oriented to person, place, and time. He displays normal reflexes. No cranial nerve deficit. He exhibits normal muscle tone.  Alert in no acute distress.  Oriented x3.  Good awareness of deficits. UE 5/5, LE 3-/5 HF, 3/5 KE and 4/5 ADF/APF. No sensory deficits  Skin: Skin is warm and dry.  Psychiatric: He has a normal mood and affect. His behavior is normal. Judgment and thought content normal.      Lab Results Last 48 Hours        Results for orders placed or performed during the hospital encounter of 08/30/18 (from the past 48 hour(s))  Glucose, capillary     Status: None    Collection Time: 09/04/18  1:27 PM  Result Value Ref Range    Glucose-Capillary 99 70 - 99 mg/dL    Comment 1 Notify RN      Comment 2 Document in Chart    Glucose, capillary     Status: Abnormal    Collection Time: 09/04/18  5:07 PM  Result Value Ref Range    Glucose-Capillary 120 (H) 70 - 99 mg/dL  CBC with Differential/Platelet     Status: Abnormal    Collection Time: 09/05/18  7:27 AM  Result Value Ref Range    WBC 4.4 4.0 - 10.5 K/uL    RBC 2.69 (L) 4.22 - 5.81 MIL/uL    Hemoglobin 8.0 (L) 13.0 - 17.0 g/dL    HCT 24.3 (L) 39.0 - 52.0 %    MCV 90.3 80.0 - 100.0 fL    MCH 29.7 26.0 - 34.0 pg    MCHC 32.9 30.0 - 36.0 g/dL    RDW 13.8 11.5 - 15.5 %     Platelets 212 150 - 400 K/uL    nRBC 0.0 0.0 - 0.2 %    Neutrophils Relative % 37 %    Neutro Abs 1.6 (L) 1.7 - 7.7 K/uL    Lymphocytes Relative 53 %    Lymphs Abs 2.3 0.7 - 4.0 K/uL    Monocytes Relative 8 %    Monocytes Absolute 0.4 0.1 - 1.0 K/uL    Eosinophils Relative 1 %    Eosinophils Absolute 0.0 0.0 - 0.5 K/uL    Basophils Relative 0 %    Basophils Absolute 0.0 0.0 - 0.1 K/uL    Immature Granulocytes 1 %    Abs Immature Granulocytes 0.02 0.00 -  0.07 K/uL      Comment: Performed at [facility], [place] [address], [place], [place] [address]  Comprehensive metabolic panel     Status: Abnormal    Collection Time: 09/05/18  7:27 AM  Result Value Ref Range    Sodium 132 (L) 135 - 145 mmol/L    Potassium 3.8 3.5 - 5.1 mmol/L    Chloride 96 (L) 98 - 111 mmol/L    CO2 24 22 - 32 mmol/L    Glucose, Bld 124 (H) 70 - 99 mg/dL    BUN 25 (H) 6 - 20 mg/dL    Creatinine, Ser 1.42 (H) 0.61 - 1.24 mg/dL    Calcium 10.2 8.9 - 10.3 mg/dL    Total Protein >12.0 (H) 6.5 - 8.1 g/dL      Comment: CONSISTENT WITH PREVIOUS RESULT    Albumin 2.8 (L) 3.5 - 5.0 g/dL    AST 18 15 - 41 U/L    ALT 12 0 - 44 U/L    Alkaline Phosphatase 56 38 - 126 U/L    Total Bilirubin 0.4 0.3 - 1.2 mg/dL    GFR calc non Af Amer 60 (L) >60 mL/min    GFR calc Af Amer >60 >60 mL/min    Anion gap 12 5 - 15      Comment: Performed at [facility], [place] [address] [place], [name] [address]  Magnesium     Status: None    Collection Time: 09/05/18  7:27 AM  Result Value Ref Range    Magnesium 2.0 1.7 - 2.4 mg/dL      Comment: Performed at [facility], [place] [address], [place], [place] [address]  Phosphorus     Status: None    Collection Time: 09/05/18  7:27 AM  Result Value Ref Range    Phosphorus 3.7 2.5 - 4.6 mg/dL      Comment: Performed at [facility], [place] [address] [place], [place] [address]  Glucose, capillary     Status: Abnormal     Collection Time: 09/05/18  8:20 AM  Result Value Ref Range    Glucose-Capillary 143 (H) 70 - 99 mg/dL    Comment 1 Notify RN      Comment 2 Document in Chart    Glucose, capillary     Status: Abnormal    Collection Time: 09/05/18 11:52 AM  Result Value Ref Range    Glucose-Capillary 142 (H) 70 - 99 mg/dL    Comment 1 Notify RN      Comment 2 Document in Chart    Glucose, capillary     Status: Abnormal    Collection Time: 09/05/18  6:07 PM  Result Value Ref Range    Glucose-Capillary 186 (H) 70 - 99 mg/dL    Comment 1 Notify RN      Comment 2 Document in Chart    Glucose, capillary     Status: Abnormal    Collection Time: 09/05/18  8:35 PM  Result Value Ref Range    Glucose-Capillary 208 (H) 70 - 99 mg/dL  Comprehensive metabolic panel     Status: Abnormal    Collection Time: 09/06/18  6:14 AM  Result Value Ref Range    Sodium 129 (L) 135 - 145 mmol/L    Potassium 3.9 3.5 - 5.1 mmol/L    Chloride 99 98 - 111 mmol/L    CO2 19 (L) 22 - 32 mmol/L    Glucose, Bld 186 (  H) 70 - 99 mg/dL    BUN 30 (H) 6 - 20 mg/dL    Creatinine, Ser 1.44 (H) 0.61 - 1.24 mg/dL    Calcium 9.5 8.9 - 10.3 mg/dL    Total Protein >12.0 (H) 6.5 - 8.1 g/dL      Comment: CONSISTENT WITH PREVIOUS RESULT    Albumin 2.8 (L) 3.5 - 5.0 g/dL    AST 17 15 - 41 U/L    ALT 11 0 - 44 U/L    Alkaline Phosphatase 51 38 - 126 U/L    Total Bilirubin 0.6 0.3 - 1.2 mg/dL    GFR calc non Af Amer 59 (L) >60 mL/min    GFR calc Af Amer >60 >60 mL/min    Anion gap 11 5 - 15      Comment: Performed at [facility], [place] [address], [place], [place] [address]  CBC with Differential/Platelet     Status: Abnormal    Collection Time: 09/06/18  6:14 AM  Result Value Ref Range    WBC 6.0 4.0 - 10.5 K/uL    RBC 2.61 (L) 4.22 - 5.81 MIL/uL    Hemoglobin 7.9 (L) 13.0 - 17.0 g/dL    HCT 23.5 (L) 39.0 - 52.0 %    MCV 90.0 80.0 - 100.0 fL    MCH 30.3 26.0 - 34.0 pg    MCHC 33.6 30.0 - 36.0 g/dL    RDW 13.7 11.5  - 15.5 %    Platelets 210 150 - 400 K/uL    nRBC 0.0 0.0 - 0.2 %    Neutrophils Relative % 62 %    Neutro Abs 3.7 1.7 - 7.7 K/uL    Lymphocytes Relative 34 %    Lymphs Abs 2.0 0.7 - 4.0 K/uL    Monocytes Relative 3 %    Monocytes Absolute 0.2 0.1 - 1.0 K/uL    Eosinophils Relative 0 %    Eosinophils Absolute 0.0 0.0 - 0.5 K/uL    Basophils Relative 0 %    Basophils Absolute 0.0 0.0 - 0.1 K/uL    Immature Granulocytes 1 %    Abs Immature Granulocytes 0.04 0.00 - 0.07 K/uL      Comment: Performed at [facility], [place] [address] [place], [place] [address]  Magnesium     Status: None    Collection Time: 09/06/18  6:14 AM  Result Value Ref Range    Magnesium 1.9 1.7 - 2.4 mg/dL      Comment: Performed at [facility], [place] [address], [place], [place] [address]  Phosphorus     Status: None    Collection Time: 09/06/18  6:14 AM  Result Value Ref Range    Phosphorus 3.7 2.5 - 4.6 mg/dL      Comment: Performed at [facility], [place] [address] [place], [place] [address]  Glucose, capillary     Status: Abnormal    Collection Time: 09/06/18  8:00 AM  Result Value Ref Range    Glucose-Capillary 142 (H) 70 - 99 mg/dL    Comment 1 Notify RN      Comment 2 Document in Chart        Imaging Results (Last 48 hours)  No results found.           Medical Problem List and Plan: 1.  Decreased functional mobility secondary to newly diagnosed multiple myeloma.              -  admit to inpatient rehab             - weekly Velcade and dexamethasone initiated 09/05/2018                         -Dr. Burr Medico is oncologist 2.  Antithrombotics: -DVT/anticoagulation: Subcutaneous Lovenox             -antiplatelet therapy: Aspirin 81 mg daily 3. Pain Management: Oxycodone as needed             -will schedule flexeril TID             -kpad prn for spasms 4. Mood: Provide emotional support             -antipsychotic agents: N/A 5. Neuropsych: This  patient is capable of making decisions on his own behalf. 6. Skin/Wound Care: Routine skin checks 7. Fluids/Electrolytes/Nutrition: Routine in and outs with follow-up chemistries             -pt with poor appetite             -will request RD assistance in improving PO intake 8.  Acute on chronic anemia.  Follow-up hematology oncology services 9.  Diabetes mellitus.  Hemoglobin A1c 9.4.  Patient stopped taking his Glucophage sometime ago.  Provide education 10.  Morbid obesity.  BMI 35.53.  Dietary follow-up 11.  Constipation.  Laxative assistance      Post Admission Physician Evaluation: 1. Functional deficits secondary  to debility related to multiple myeloma and associated pain. 2. Patient is admitted to receive collaborative, interdisciplinary care between the physiatrist, rehab nursing staff, and therapy team. 3. Patient's level of medical complexity and substantial therapy needs in context of that medical necessity cannot be provided at a lesser intensity of care such as a SNF. 4. Patient has experienced substantial functional loss from his/her baseline which was documented above under the "Functional History" and "Functional Status" headings.  Judging by the patient's diagnosis, physical exam, and functional history, the patient has potential for functional progress which will result in measurable gains while on inpatient rehab.  These gains will be of substantial and practical use upon discharge  in facilitating mobility and self-care at the household level. 5. Physiatrist will provide 24 hour management of medical needs as well as oversight of the therapy plan/treatment and provide guidance as appropriate regarding the interaction of the two. 6. The Preadmission Screening has been reviewed and patient status is unchanged unless otherwise stated above. 7. 24 hour rehab nursing will assist with bladder management, bowel management, safety, skin/wound care, disease management, medication  administration, pain management and patient education  and help integrate therapy concepts, techniques,education, etc. 8. PT will assess and treat for/with: Lower extremity strength, range of motion, stamina, balance, functional mobility, safety, adaptive techniques and equipment, pain mgt.   Goals are: supervision to min assist. 9. OT will assess and treat for/with: ADL's, functional mobility, safety, upper extremity strength, adaptive techniques and equipment, NMR, pain mgt, community reentry.   Goals are: supervision to min assist. Therapy may proceed with showering this patient. 10. SLP will assess and treat for/with: n/a.  Goals are: n/a. 11. Case Management and Social Worker will assess and treat for psychological issues and discharge planning. 12. Team conference will be held weekly to assess progress toward goals and to determine barriers to discharge. 13. Patient will receive at least 3 hours of therapy per day at least 5 days per week. 14. ELOS:  15-18 days       15. Prognosis:  excellent   I have personally performed a face to face diagnostic evaluation of this patient and formulated the key components of the plan.  Additionally, I have personally reviewed laboratory data, imaging studies, as well as relevant notes and concur with the physician assistant's documentation above.  Meredith Staggers, MD, FAAPMR       Lavon Paganini [place], PA-C 09/06/2018

## 2018-09-07 NOTE — Progress Notes (Addendum)
Inpatient Rehab Admissions Coordinator:   I have insurance authorization and approval from Dr. Grandville Silos for pt to admit to inpatient rehab today.  I will call pt's RN and CSW to alert them and arrange [facility] transportation.  Pt to [facility] to [facility], room 10.  I will alert pt/family.   Addendum: I have arranged [facility] transport between 230-3pm.   Shann Medal, PT, DPT Admissions Coordinator [phone] 09/07/18  12:42 PM

## 2018-09-08 ENCOUNTER — Inpatient Hospital Stay (HOSPITAL_COMMUNITY): Payer: 59 | Admitting: Occupational Therapy

## 2018-09-08 ENCOUNTER — Inpatient Hospital Stay (HOSPITAL_COMMUNITY): Payer: 59

## 2018-09-08 DIAGNOSIS — E1129 Type 2 diabetes mellitus with other diabetic kidney complication: Secondary | ICD-10-CM

## 2018-09-08 DIAGNOSIS — E1165 Type 2 diabetes mellitus with hyperglycemia: Secondary | ICD-10-CM

## 2018-09-08 LAB — CBC WITH DIFFERENTIAL/PLATELET
Abs Immature Granulocytes: 0.03 10*3/uL (ref 0.00–0.07)
Basophils Absolute: 0 10*3/uL (ref 0.0–0.1)
Basophils Relative: 0 %
Eosinophils Absolute: 0 10*3/uL (ref 0.0–0.5)
Eosinophils Relative: 0 %
HCT: 20.3 % — ABNORMAL LOW (ref 39.0–52.0)
Hemoglobin: 6.9 g/dL — CL (ref 13.0–17.0)
Immature Granulocytes: 1 %
Lymphocytes Relative: 18 %
Lymphs Abs: 0.5 10*3/uL — ABNORMAL LOW (ref 0.7–4.0)
MCH: 29.6 pg (ref 26.0–34.0)
MCHC: 34 g/dL (ref 30.0–36.0)
MCV: 87.1 fL (ref 80.0–100.0)
Monocytes Absolute: 0.4 10*3/uL (ref 0.1–1.0)
Monocytes Relative: 13 %
Neutro Abs: 1.9 10*3/uL (ref 1.7–7.7)
Neutrophils Relative %: 68 %
Platelets: 169 10*3/uL (ref 150–400)
RBC: 2.33 MIL/uL — ABNORMAL LOW (ref 4.22–5.81)
RDW: 13.7 % (ref 11.5–15.5)
WBC: 2.8 10*3/uL — ABNORMAL LOW (ref 4.0–10.5)
nRBC: 0.7 % — ABNORMAL HIGH (ref 0.0–0.2)

## 2018-09-08 LAB — URINALYSIS, ROUTINE W REFLEX MICROSCOPIC
Bilirubin Urine: NEGATIVE
Glucose, UA: NEGATIVE mg/dL
Hgb urine dipstick: NEGATIVE
Ketones, ur: NEGATIVE mg/dL
Leukocytes,Ua: NEGATIVE
Nitrite: NEGATIVE
Protein, ur: NEGATIVE mg/dL
Specific Gravity, Urine: 1.016 (ref 1.005–1.030)
pH: 5 (ref 5.0–8.0)

## 2018-09-08 LAB — D-DIMER, QUANTITATIVE: D-Dimer, Quant: 0.56 ug/mL-FEU — ABNORMAL HIGH (ref 0.00–0.50)

## 2018-09-08 LAB — COMPREHENSIVE METABOLIC PANEL
ALT: 13 U/L (ref 0–44)
AST: 16 U/L (ref 15–41)
Albumin: 2.3 g/dL — ABNORMAL LOW (ref 3.5–5.0)
Alkaline Phosphatase: 48 U/L (ref 38–126)
Anion gap: 12 (ref 5–15)
BUN: 19 mg/dL (ref 6–20)
CO2: 20 mmol/L — ABNORMAL LOW (ref 22–32)
Calcium: 7.9 mg/dL — ABNORMAL LOW (ref 8.9–10.3)
Chloride: 95 mmol/L — ABNORMAL LOW (ref 98–111)
Creatinine, Ser: 1.28 mg/dL — ABNORMAL HIGH (ref 0.61–1.24)
GFR calc Af Amer: >60 mL/min (ref >60)
GFR calc non Af Amer: >60 mL/min (ref >60)
Glucose, Bld: 113 mg/dL — ABNORMAL HIGH (ref 70–99)
Potassium: 3.2 mmol/L — ABNORMAL LOW (ref 3.5–5.1)
Sodium: 127 mmol/L — ABNORMAL LOW (ref 135–145)
Total Bilirubin: 0.6 mg/dL (ref 0.3–1.2)
Total Protein: 11.2 g/dL — ABNORMAL HIGH (ref 6.5–8.1)

## 2018-09-08 LAB — GLUCOSE, CAPILLARY
Glucose-Capillary: 108 mg/dL — ABNORMAL HIGH (ref 70–99)
Glucose-Capillary: 90 mg/dL (ref 70–99)
Glucose-Capillary: 95 mg/dL (ref 70–99)
Glucose-Capillary: 108 mg/dL — ABNORMAL HIGH (ref 70–99)

## 2018-09-08 LAB — CBC
HCT: 20.1 % — ABNORMAL LOW (ref 39.0–52.0)
Hemoglobin: 6.8 g/dL — CL (ref 13.0–17.0)
MCH: 29.7 pg (ref 26.0–34.0)
MCHC: 33.8 g/dL (ref 30.0–36.0)
MCV: 87.8 fL (ref 80.0–100.0)
Platelets: 176 10*3/uL (ref 150–400)
RBC: 2.29 MIL/uL — ABNORMAL LOW (ref 4.22–5.81)
RDW: 13.6 % (ref 11.5–15.5)
WBC: 2.7 10*3/uL — ABNORMAL LOW (ref 4.0–10.5)
nRBC: 0 % (ref 0.0–0.2)

## 2018-09-08 MED ORDER — LEVOFLOXACIN IN D5W 750 MG/150ML IV SOLN
750.0000 mg | INTRAVENOUS | Status: AC
  Administered 2018-09-08 – 2018-09-12 (×5): 750 mg via INTRAVENOUS
  Filled 2018-09-08 (×5): qty 150

## 2018-09-08 MED ORDER — SODIUM CHLORIDE 0.9 % IV SOLN
INTRAVENOUS | Status: DC
  Administered 2018-09-08 – 2018-09-09 (×2): via INTRAVENOUS

## 2018-09-08 MED ORDER — ENSURE MAX PROTEIN PO LIQD
11.0000 [oz_av] | Freq: Two times a day (BID) | ORAL | Status: DC
  Administered 2018-09-08: 237 mL via ORAL
  Administered 2018-09-09 – 2018-09-19 (×16): 11 [oz_av] via ORAL
  Filled 2018-09-08 (×22): qty 330

## 2018-09-08 MED ORDER — POTASSIUM CHLORIDE CRYS ER 20 MEQ PO TBCR
20.0000 meq | EXTENDED_RELEASE_TABLET | Freq: Once | ORAL | Status: AC
  Administered 2018-09-08: 16:00:00 20 meq via ORAL
  Filled 2018-09-08: qty 1

## 2018-09-08 NOTE — Progress Notes (Signed)
Patient had fever on evening vitals. MD notified. Tylenol ordered. Temp decreased. On morning rounding patient complained of headache, dizziness, cramping in right rib cage, dry mouth and being cold, was shivering on assessment. Had fever. MD notified. New orders placed. Will continue to monitor.

## 2018-09-08 NOTE — Progress Notes (Signed)
Occupational Therapy Note  Patient Details  Name: Edward Todd MRN: [id number] Date of Birth: 02-19-1976  Today's Date: 09/08/2018 OT Missed Time:  75 min Missed Time Reason:  ill  Pt is scheduled for a 75 min OT evaluation and treatment.  He is running a low grade fever.  Attempted to see pt, but pt shaking his head no keeping his eyes closed.  Encouraged pt to engage in conversation about his rehab plan but he would not open his eyes.  Will attempt to see later this morning.   [place] 09/08/2018, 8:04 AM

## 2018-09-08 NOTE — Progress Notes (Addendum)
Temp recheck 102.6 after tylenol; urinalysis resulted.MD notified.

## 2018-09-08 NOTE — Progress Notes (Signed)
Occupational Therapy Note  Patient Details  Name: Edward Todd MRN: [id number] Date of Birth: 1975-11-08  Today's Date: 09/08/2018   Attempted to see pt a 2nd time at 1215, but nursing received a critical lab value with low Hgb of 6.8.  Waiting on IV team for treatment.   Went to see pt and his eyes were still closed, he did respond that he was not well enough to do therapy.   Will need to postpone OT evaluation as pt is not medically stable at this time.    [place] 09/08/2018, 1:04 PM

## 2018-09-08 NOTE — Progress Notes (Signed)
Occupational Therapy Session Note  Patient Details  Name: Edward Todd MRN: [id number] Date of Birth: Apr 26, 1975   Not seen per earlier OT note.   Patient with decreased medical status and not safe to evaluate this date.   Missed earlier scheduled session and latter 60 minute OT session.   Alfredia Ferguson [facility] 09/08/2018, 3:08 PM

## 2018-09-08 NOTE — Progress Notes (Signed)
MD notified of critical hgb 6.9.

## 2018-09-08 NOTE — Progress Notes (Signed)
Consult was placed earlier for new iv;  Pt seen/attempted x 2 by IV Team;   Pt also assessed by this Probation officer;  Attempted x 1, also using ultrasound, in the right anterior forearm; unable to thread;  No suitable veins noted in RUA;  No access at  present; RN aware.

## 2018-09-08 NOTE — Progress Notes (Signed)
2130. Up[date called to MD. Bp=135/76, temp=103F, hr=110, resp=22. Last tylenol given @ 1905. Will continue tylenol q 6 hr and monitor throughout this shift as ordered.

## 2018-09-08 NOTE — Progress Notes (Addendum)
Edward Todd is a 43 y.o. male 01/09/1976 [id number]  Subjective: Complains of not feeling well since last night.  He was noted to have fever.  Is complaining of weakness and pain in the left side of the chest over the rib area anteriorly.  There is no cough, urinary symptoms, runny nose, diarrhea, abdominal pain dysuria.  Objective: Vital signs in last 24 hours: Temp:  [98.2 F (36.8 C)-103.1 F (39.5 C)] 103.1 F (39.5 C) (05/16 1330) Pulse Rate:  [101-115] 115 (05/16 1330) Resp:  [16-20] 16 (05/16 1330) BP: (131-152)/(78-90) 146/78 (05/16 1330) SpO2:  [96 %-100 %] 98 % (05/16 1330) Weight:  [130.7 kg] 130.7 kg (05/15 1654) Weight change:  Last BM Date: 09/06/18  Intake/Output from previous day: 05/15 0701 - 05/16 0700 In: -  Out: 450 [Urine:450] Last cbgs: CBG (last 3)  Recent Labs    09/07/18 1656 09/08/18 0725 09/08/18 1143  GLUCAP 91 108* 90     Physical Exam General: No apparent distress.  He looks tired HEENT: not dry Lungs: Normal effort. Lungs clear to auscultation, no crackles or wheezes.  Decreased breath sounds at bases Cardiovascular: Regular rate and rhythm, no edema Abdomen: S/NT/ND; BS(+)  Musculoskeletal:  Unchanged.  Ribs are sensitive to palpation under the left nipple.  No rash Neurological: No new neurological deficits Wounds: N/A    Skin: clear   Mental state: Alert, oriented, cooperative    Lab Results: BMET    Component Value Date/Time   NA 127 (L) 09/08/2018 0726   K 3.2 (L) 09/08/2018 0726   CL 95 (L) 09/08/2018 0726   CO2 20 (L) 09/08/2018 0726   GLUCOSE 113 (H) 09/08/2018 0726   BUN 19 09/08/2018 0726   CREATININE 1.28 (H) 09/08/2018 0726   CREATININE 1.17 10/09/2015 1504   CALCIUM 7.9 (L) 09/08/2018 0726   GFRNONAA >60 09/08/2018 0726   GFRAA >60 09/08/2018 0726   CBC    Component Value Date/Time   WBC 2.7 (L) 09/08/2018 0726   RBC 2.29 (L) 09/08/2018 0726   HGB 6.8 (LL) 09/08/2018 0726   HCT 20.1 (L) 09/08/2018  0726   PLT 176 09/08/2018 0726   MCV 87.8 09/08/2018 0726   MCV 92.3 02/14/2013 2009   MCH 29.7 09/08/2018 0726   MCHC 33.8 09/08/2018 0726   RDW 13.6 09/08/2018 0726   LYMPHSABS 0.8 09/07/2018 0654   MONOABS 0.2 09/07/2018 0654   EOSABS 0.0 09/07/2018 0654   BASOSABS 0.0 09/07/2018 0654    Studies/Results: Dg Chest 2 View  Result Date: 09/08/2018 CLINICAL DATA:  [place]alaise EXAM: CHEST - 2 VIEW COMPARISON:  Chest radiograph 12/09/2015 FINDINGS: Shallow lung inflation with mild central pulmonary vascular congestion. Bibasilar linear atelectasis. No pleural effusion or pneumothorax. IMPRESSION: Shallow lung inflation with bibasilar atelectasis. Electronically Signed   By: Kevin  Herman M.D.   On: 09/08/2018 06:16    Medications: I have reviewed the patient's current medications.  Assessment/Plan:   1.  Multiple myeloma.  CIR to help with strengthening and pain.  On the weekly Velcade and dexamethasone.  Follow-up with Dr. Feng 2.  DVT prophylaxis with Lovenox.  Aspirin daily for antiplatelet agent 3.  Pain management with PRN oxycodone, Flexeril as needed K pad for spasms 4.  Emotional support 5.  Poor appetite.  RD assistance was requested 6.  Acute on chronic anemia.  Management per oncology services 7.  Type 2 diabetes.  Poor control at home.  Diabetic education 8.  Obesity.  Dietary follow-up 9.  Constipation.    Laxative assistance 10.  New fever of unknown etiology.  Lab work/chest x-ray reviewed.  Empiric IV Levaquin 11.  Hypokalemia.  Will replace potassium orally 12.  Hyponatremia.  Will recheck tomorrow 13.  Monitor oral intake.  May need IVF     Length of stay, days: 1  Alex Plotnikov , MD 09/08/2018, 2:36 PM   

## 2018-09-08 NOTE — Progress Notes (Signed)
Lab called critical hgb 6.8; MD notified

## 2018-09-08 NOTE — Progress Notes (Signed)
Physical Therapy Note  Patient Details  Name: Edward Todd MRN: [id number] Date of Birth: 16-Nov-1975 Today's Date: 09/08/2018    Attempted PT evaluation this afternoon. Per patient's chart, patient with critical lab value with low Hgb of 6.8, however RN cleared patient to continue with PT evaluation as able. Patient would not open his eyes, but responded to PT's questions in a very low volume. He was oriented to person, place, and situation, but not time. PT re-oriented patient and took vitals: BP 144/78, HR 115, O2 92% on RA, and temperature 103.1. RN made aware of high temperature and notifying MD. Will re-attempt PT evaluation at a later time when patient is medically stable.    Takota Cahalan L Eulalia Ellerman PT, DPT  09/08/2018, 1:49 PM

## 2018-09-08 NOTE — Progress Notes (Signed)
Initial Nutrition Assessment  DOCUMENTATION CODES:  Obesity unspecified  INTERVENTION:  Ensure Max protein supplement BID, each supplement provides 150kcal and 30g of protein.  When able, please obtain weight of patient. His current listed wt appears to have been carried from distant encounter.   NUTRITION DIAGNOSIS:  Increased nutrient needs related to cancer and cancer related treatments(New dx of MM s/p first tx) as evidenced by estimated nutritional requirements for this condition  GOAL:  Patient will meet greater than or equal to 90% of their needs  MONITOR:  PO intake, Labs, I & O's, Supplement acceptance, Diet advancement, Weight trends  REASON FOR ASSESSMENT:  Consult Poor PO  ASSESSMENT:  43 y/o male PMHx obesity, recent dx of DM2 as well as recent dx of Multiple Myeloma. Had been hospitalized at [facility] 5/7-5/15 after he presented with intractable back pain. Found to have acute anemia and MRI concerning for abnormal process of marrow s/p biopsy 5/12. Dx w/ MM and received first tx 5/13. Tx to CIR for rehab.   RD operating remotely due to covid precautions. Consulted d/t reported poor intake. RD attempted to reach pt via phone today x2 to discuss, but was unsuccessful. Per chart, pt has not been feeling well today, complaining of pain, weakness. He is febrile and lethargic. He was unable to work with therapy today. ?side effect of recent chemo?  Per recent oncology consult note, pt had reported a loss of 20 lbs "recently". Pts current weight appears to have been pulled from a 2017 encounter. Unable to confirm loss at this time. Will recommended re weight of pt to confirm.   Per review of intake records, pt has been eating ~50% of meals this past week. However, he had been eating 50-100% of meals 5/7-5/9.   Will order oral supplements for pt and monitor meal intakes.   Labs: A1c 5/12 9.4- new dx of DM2 , Na: 127 (129-132 throughout hospitilization), k:3.2, Albumin:2.3,  Bun/Creat:19/1.28 (elevated throughout hospitalization)  Meds: Metformin, Miralax, ppi, kcl IVF, IV abx, senokot S, ppi  Recent Labs  Lab 09/04/18 0518 09/05/18 0727 09/06/18 0614 09/07/18 0654 09/08/18 0726  NA 132* 132* 129* 131* 127*  K 3.6 3.8 3.9 3.7 3.2*  CL 98 96* 99 102 95*  CO2 24 24 19* 21* 20*  BUN 19 25* 30* 37* 19  CREATININE 1.28* 1.42* 1.44* 1.34* 1.28*  CALCIUM 10.3 10.2 9.5 8.5* 7.9*  MG 1.9 2.0 1.9  --   --   PHOS 4.5 3.7 3.7  --   --   GLUCOSE 108* 124* 186* 174* 113*   NUTRITION - FOCUSED PHYSICAL EXAM: Unable to conduct  Diet Order:   Diet Order            Diet Carb Modified Fluid consistency: Thin; Room service appropriate? Yes  Diet effective now             EDUCATION NEEDS:  Not appropriate for education at this time  Skin:  Skin Assessment: Reviewed RN Assessment  Last BM:  5/14  Height:  Ht Readings from Last 1 Encounters:  09/07/18 6' 3.51" (1.918 m)   Weight:  Wt Readings from Last 1 Encounters:  09/07/18 130.7 kg   Wt Readings from Last 10 Encounters:  09/07/18 130.7 kg  09/03/18 130.7 kg  03/21/16 130.7 kg  02/25/16 128 kg  10/09/15 126.6 kg  02/21/14 130.8 kg  02/14/13 129.7 kg   Ideal Body Weight:  90.45 kg  BMI:  Body mass index is 35.53  kg/m.  Estimated Nutritional Needs:  Kcal:  2100-2350 (16-18 kcal/kg bw) Protein:  105-120g Pro (~20% of energy) Fluid:  >2.1 L fluid (33m/ kcal)  NBurtis JunesRD, LDN, CNSC Clinical Nutrition Available Tues-Sat via Pager: [phone]/16/2020 3:32 PM

## 2018-09-08 NOTE — Progress Notes (Signed)
MD notified of patient's temp 103.1; u/a sent for urinalysis per order. Tylenol given. Patient is weak and unable to participate with therapy.

## 2018-09-09 DIAGNOSIS — G8929 Other chronic pain: Secondary | ICD-10-CM

## 2018-09-09 LAB — CBC
HCT: 18.6 % — ABNORMAL LOW (ref 39.0–52.0)
Hemoglobin: 6.3 g/dL — CL (ref 13.0–17.0)
MCH: 30.1 pg (ref 26.0–34.0)
MCHC: 33.9 g/dL (ref 30.0–36.0)
MCV: 89 fL (ref 80.0–100.0)
Platelets: 165 10*3/uL (ref 150–400)
RBC: 2.09 MIL/uL — ABNORMAL LOW (ref 4.22–5.81)
RDW: 13.8 % (ref 11.5–15.5)
WBC: 3.1 10*3/uL — ABNORMAL LOW (ref 4.0–10.5)
nRBC: 0 % (ref 0.0–0.2)

## 2018-09-09 LAB — COMPREHENSIVE METABOLIC PANEL
ALT: 12 U/L (ref 0–44)
ALT: 13 U/L (ref 0–44)
AST: 18 U/L (ref 15–41)
AST: 21 U/L (ref 15–41)
Albumin: 2 g/dL — ABNORMAL LOW (ref 3.5–5.0)
Albumin: 2.1 g/dL — ABNORMAL LOW (ref 3.5–5.0)
Alkaline Phosphatase: 45 U/L (ref 38–126)
Alkaline Phosphatase: 45 U/L (ref 38–126)
Anion gap: 11 (ref 5–15)
Anion gap: 9 (ref 5–15)
BUN: 15 mg/dL (ref 6–20)
BUN: 14 mg/dL (ref 6–20)
CO2: 20 mmol/L — ABNORMAL LOW (ref 22–32)
CO2: 19 mmol/L — ABNORMAL LOW (ref 22–32)
Calcium: 7.4 mg/dL — ABNORMAL LOW (ref 8.9–10.3)
Calcium: 7.3 mg/dL — ABNORMAL LOW (ref 8.9–10.3)
Chloride: 99 mmol/L (ref 98–111)
Chloride: 102 mmol/L (ref 98–111)
Creatinine, Ser: 1.34 mg/dL — ABNORMAL HIGH (ref 0.61–1.24)
Creatinine, Ser: 1.17 mg/dL (ref 0.61–1.24)
GFR calc Af Amer: >60 mL/min (ref >60)
GFR calc Af Amer: >60 mL/min (ref >60)
GFR calc non Af Amer: >60 mL/min (ref >60)
GFR calc non Af Amer: >60 mL/min (ref >60)
Glucose, Bld: 108 mg/dL — ABNORMAL HIGH (ref 70–99)
Glucose, Bld: 104 mg/dL — ABNORMAL HIGH (ref 70–99)
Potassium: 3.3 mmol/L — ABNORMAL LOW (ref 3.5–5.1)
Potassium: 3.5 mmol/L (ref 3.5–5.1)
Sodium: 130 mmol/L — ABNORMAL LOW (ref 135–145)
Sodium: 130 mmol/L — ABNORMAL LOW (ref 135–145)
Total Bilirubin: 0.5 mg/dL (ref 0.3–1.2)
Total Bilirubin: 0.6 mg/dL (ref 0.3–1.2)
Total Protein: 10.9 g/dL — ABNORMAL HIGH (ref 6.5–8.1)
Total Protein: 11.3 g/dL — ABNORMAL HIGH (ref 6.5–8.1)

## 2018-09-09 LAB — CBC WITH DIFFERENTIAL/PLATELET
Abs Immature Granulocytes: 0.01 10*3/uL (ref 0.00–0.07)
Basophils Absolute: 0 10*3/uL (ref 0.0–0.1)
Basophils Relative: 0 %
Eosinophils Absolute: 0 10*3/uL (ref 0.0–0.5)
Eosinophils Relative: 0 %
HCT: 22.7 % — ABNORMAL LOW (ref 39.0–52.0)
Hemoglobin: 7.6 g/dL — ABNORMAL LOW (ref 13.0–17.0)
Immature Granulocytes: 0 %
Lymphocytes Relative: 47 %
Lymphs Abs: 1.6 10*3/uL (ref 0.7–4.0)
MCH: 29.8 pg (ref 26.0–34.0)
MCHC: 33.5 g/dL (ref 30.0–36.0)
MCV: 89 fL (ref 80.0–100.0)
Monocytes Absolute: 0.2 10*3/uL (ref 0.1–1.0)
Monocytes Relative: 7 %
Neutro Abs: 1.5 10*3/uL — ABNORMAL LOW (ref 1.7–7.7)
Neutrophils Relative %: 46 %
Platelets: 171 10*3/uL (ref 150–400)
RBC: 2.55 MIL/uL — ABNORMAL LOW (ref 4.22–5.81)
RDW: 13.7 % (ref 11.5–15.5)
WBC: 3.3 10*3/uL — ABNORMAL LOW (ref 4.0–10.5)
nRBC: 0 % (ref 0.0–0.2)

## 2018-09-09 LAB — URINE CULTURE: Culture: <10000 — AB

## 2018-09-09 LAB — GLUCOSE, CAPILLARY
Glucose-Capillary: 94 mg/dL (ref 70–99)
Glucose-Capillary: 102 mg/dL — ABNORMAL HIGH (ref 70–99)
Glucose-Capillary: 95 mg/dL (ref 70–99)

## 2018-09-09 LAB — ABO/RH: ABO/RH(D): O POS

## 2018-09-09 LAB — PREPARE RBC (CROSSMATCH)

## 2018-09-09 MED ORDER — ACETAMINOPHEN 325 MG PO TABS
650.0000 mg | ORAL_TABLET | Freq: Once | ORAL | Status: AC
  Administered 2018-09-09: 650 mg via ORAL
  Filled 2018-09-09: qty 2

## 2018-09-09 MED ORDER — ZOLPIDEM TARTRATE 5 MG PO TABS
5.0000 mg | ORAL_TABLET | Freq: Every evening | ORAL | Status: DC | PRN
  Administered 2018-09-09: 10 mg via ORAL
  Filled 2018-09-09: qty 2

## 2018-09-09 MED ORDER — SODIUM CHLORIDE 0.9% IV SOLUTION
Freq: Once | INTRAVENOUS | Status: AC
  Administered 2018-09-09: 14:00:00 via INTRAVENOUS

## 2018-09-09 MED ORDER — POTASSIUM CHLORIDE CRYS ER 20 MEQ PO TBCR
20.0000 meq | EXTENDED_RELEASE_TABLET | Freq: Every day | ORAL | Status: DC
  Administered 2018-09-09 – 2018-09-25 (×17): 20 meq via ORAL
  Filled 2018-09-09 (×17): qty 1

## 2018-09-09 MED ORDER — DIPHENHYDRAMINE HCL 25 MG PO CAPS
25.0000 mg | ORAL_CAPSULE | Freq: Once | ORAL | Status: AC
  Administered 2018-09-09: 25 mg via ORAL
  Filled 2018-09-09: qty 1

## 2018-09-09 NOTE — Progress Notes (Signed)
Edward Todd is a 43 y.o. male 07/22/1975 [id number]  Subjective:  Feeling better overall.  The fevers are down.  Complains of insomnia-was unable to sleep for several nights.  Objective: Vital signs in last 24 hours: Temp:  [98.6 F (37 C)-103.1 F (39.5 C)] 98.6 F (37 C) (05/17 0818) Pulse Rate:  [94-115] 94 (05/17 0457) Resp:  [16-22] 18 (05/17 0457) BP: (134-146)/(76-83) 134/83 (05/17 0457) SpO2:  [93 %-98 %] 95 % (05/17 0457) Weight change:  Last BM Date: 09/06/18  Intake/Output from previous day: 05/16 0701 - 05/17 0700 In: -  Out: 1750 [Urine:1750] Last cbgs: CBG (last 3)  Recent Labs    09/08/18 1653 09/08/18 2110 09/09/18 0647  GLUCAP 95 108* 94     Physical Exam General: No apparent distress, lays in bed with his eyes closed HEENT: not dry Lungs: Normal effort. Lungs clear to auscultation, no crackles or wheezes. Cardiovascular: Regular rate and rhythm, no edema Abdomen: S/NT/ND; BS(+) Musculoskeletal:  unchanged Neurological: No new neurological deficits Wounds: N/A    Skin: clear   Mental state: Alert, oriented, cooperative    Lab Results: BMET    Component Value Date/Time   NA 130 (L) 09/09/2018 0735   K 3.3 (L) 09/09/2018 0735   CL 99 09/09/2018 0735   CO2 20 (L) 09/09/2018 0735   GLUCOSE 108 (H) 09/09/2018 0735   BUN 15 09/09/2018 0735   CREATININE 1.34 (H) 09/09/2018 0735   CREATININE 1.17 10/09/2015 1504   CALCIUM 7.4 (L) 09/09/2018 0735   GFRNONAA >60 09/09/2018 0735   GFRAA >60 09/09/2018 0735   CBC    Component Value Date/Time   WBC 3.1 (L) 09/09/2018 0735   RBC 2.09 (L) 09/09/2018 0735   HGB 6.3 (LL) 09/09/2018 0735   HCT 18.6 (L) 09/09/2018 0735   PLT 165 09/09/2018 0735   MCV 89.0 09/09/2018 0735   MCV 92.3 02/14/2013 2009   MCH 30.1 09/09/2018 0735   MCHC 33.9 09/09/2018 0735   RDW 13.8 09/09/2018 0735   LYMPHSABS 0.5 (L) 09/08/2018 1525   MONOABS 0.4 09/08/2018 1525   EOSABS 0.0 09/08/2018 1525   BASOSABS  0.0 09/08/2018 1525    Studies/Results: Dg Chest 2 View  Result Date: 09/08/2018 CLINICAL DATA:  [place]alaise EXAM: CHEST - 2 VIEW COMPARISON:  Chest radiograph 12/09/2015 FINDINGS: Shallow lung inflation with mild central pulmonary vascular congestion. Bibasilar linear atelectasis. No pleural effusion or pneumothorax. IMPRESSION: Shallow lung inflation with bibasilar atelectasis. Electronically Signed   By: Ulyses Jarred M.D.   On: 09/08/2018 06:16    Medications: I have reviewed the patient's current medications.  Assessment/Plan:   1.  Multiple myeloma.  CIR to help with strengthening and pain.  On weekly Velcade and dexamethasone.  Follow-up with Dr. Burr Medico 2.  DVT prophylaxis with Lovenox.  Aspirin daily for antiplatelet agent 3.  Pain management with PRN oxycodone.  Flexeril as needed.  K pad as needed 4.  Emotional support 5.  Poor appetite.  RD assistance 6.  Acute on chronic anemia.  Worse.  Will transfuse with 1 unit of RBCs.  Will premedicate 7.  Type 2 diabetes.  Poor control at home.  Diabetic education.  On metformin 8.  Obesity.  Dietary follow-up 9.  Constipation.  Laxative assistance 10.  Fever of unknown etiology.  Empiric IV Levaquin.  The fever is down 11.  Hypokalemia will replace potassium orally 12.  Hyponatremia.  Will monitor 13.  Insomnia.  Ambien PRN     Length of  stay, days: 2  Walker Kehr , MD 09/09/2018, 11:08 AM

## 2018-09-09 NOTE — Progress Notes (Signed)
Lab called critical value Hgb 6.3. MD notified

## 2018-09-09 NOTE — Progress Notes (Signed)
Hem/Onc brief note   Lab reviewed, Hg 6.3 today, please consider blood transfusion 1u leukoreduced and irradiated PRBC, thanks.  I plan to see pt tomorrow.  Truitt Merle  09/09/2018

## 2018-09-09 NOTE — Evaluation (Signed)
Occupational Therapy Assessment and Plan  Patient Details  Name: Edward Todd MRN: [id number] Date of Birth: [birth year]  OT Diagnosis: acute pain, disturbance of vision and muscle weakness (generalized) Rehab Potential: Rehab Potential (ACUTE ONLY): Good ELOS: 2 weeks   Today's Date: 09/09/2018 OT Individual Time: 8786-7672 OT Individual Time Calculation (min): 45 min     Problem List:  Patient Active Problem List   Diagnosis Date Noted  . HTN (hypertension) 09/05/2018  . Type 2 diabetes mellitus without complication ([place]) 09/47/0962  . Hypophosphatemia 09/05/2018  . Constipation 09/05/2018  . Anemia of chronic disease 09/05/2018  . Multiple myeloma ([place])   . Symptomatic anemia 08/30/2018  . Acute bilateral low back pain   . Weakness of both lower extremities     Past Medical History: History reviewed. No pertinent past medical history. Past Surgical History: History reviewed. No pertinent surgical history.  Assessment & Plan Clinical Impression: Patient is a 43 y.o. year old male with with history of type II diabetes mellitus however he had stopped taking his metformin sometime ago. By report patient had an accident at work March 2020 where the oxygen tank fell on his back at the lower lumbar upper sacral area. He went to [employer] physician ordered muscle relaxer and pain medications but did not receive any imaging or further work-up. Since March he has been out of work walking with a cane with decreasing functional mobility. No bowel or bladder disturbances. He does report being treated for pneumonia approximately 1 month ago. Per chart review lives with his spouse. One level home one-step to entry. Presented 08/30/2018 with increasing back pain. 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 Subarticular zone narrowing could affect both L5 nerve roots.  Chronic appearing deformities of L2 and L4 but no acute or subacute bone marrow edema to suggest a recent injury. Diffusely abnormal bone marrow signal, with subcentimeter foci of postcontrast enhancement.This felt could possibly be related to severe anemia, body habitus but a marrow replacement process could not be excluded. Hematology service is consulted with noted hemoglobin 7.6, RBCs 2.52 as well as hyperproteinemia.. A bone survey showed lesions in the scapula bilaterally suggesting possible multiple myeloma. IR was consulted for possible vertebroplasty kyphoplasty however this was not recommended per IR team. A bone marrow biopsy was performed consistent with multiple myeloma. He was started on weekly Velcade and dexamethasone 09/05/2018. Subcutaneous Lovenox for DVT prophylaxis. Findings of elevated hemoglobin A1c of 9.4 placed on sliding scale insulin.  Patient transferred to CIR on 09/07/2018 .    Patient currently requires total with basic self-care skills secondary to muscle weakness, decreased cardiorespiratoy endurance and decreased sitting balance, decreased standing balance and decreased postural control.  Prior to hospitalization, patient could complete self care with modified independent .  Patient will benefit from skilled intervention to decrease level of assist with basic self-care skills and increase independence with basic self-care skills prior to discharge home with care partner.  Anticipate patient will require intermittent supervision and minimal physical assistance and follow up home health.  OT - End of Session Activity Tolerance: Tolerates < 10 min activity with changes in vital signs Endurance Deficit: Yes Endurance Deficit Description: patient requires rest breaks for change of position in bed, unable to tolerate basic self care at this time OT Assessment Rehab Potential (ACUTE ONLY): Good OT Barriers to Discharge: Medical stability OT Barriers to Discharge  Comments: critically low  hemoglobin and recent temps over 102 OT Patient demonstrates impairments in the following area(s): Balance;Endurance;Pain;Vision OT Basic ADL's Functional Problem(s): Eating;Grooming;Bathing;Dressing;Toileting OT Advanced ADL's Functional Problem(s): Simple Meal Preparation OT Transfers Functional Problem(s): Toilet;Tub/Shower OT Additional Impairment(s): Fuctional Use of Upper Extremity OT Plan OT Intensity: Minimum of 1-2 x/day, 45 to 90 minutes OT Frequency: Total of 15 hours over 7 days of combined therapies OT Duration/Estimated Length of Stay: 2 weeks OT Treatment/Interventions: Balance/vestibular training;Discharge planning;Pain management;Self Care/advanced ADL retraining;Therapeutic Activities;UE/LE Coordination activities;Functional mobility training;Patient/family education;Therapeutic Exercise;Visual/perceptual remediation/compensation;Community reintegration;DME/adaptive equipment instruction;UE/LE Strength taining/ROM OT Self Feeding Anticipated Outcome(s): Independent OT Basic Self-Care Anticipated Outcome(s): CG/min A OT Toileting Anticipated Outcome(s): CG A OT Bathroom Transfers Anticipated Outcome(s): min A OT Recommendation Patient destination: Home Follow Up Recommendations: Home health OT Equipment Recommended: 3 in 1 bedside comode;Tub/shower seat   Skilled Therapeutic Intervention Patient supine in bed, alert, c/o HA but agreeable to attempting light activity.  Reviewed role of OT in rehab setting, therapy schedule, plan of care and goals.  Discussed effects of low hemoglobin and energy conservation.  Patient able answer questions related to home environment and complete cog portion of evaluation but exhaustion and pain limit his ability to complete self care tasks and functional transfers at this time.  Patient notes that his vision seems impaired but unable to tolerate eyes open for visual assessment.  Patient completed face washing with min A.   He requested to sit edge of bed - requires increased time and mod A for supine to SSP and return to supine.  Able to sit at edge of bed CS with flexed posture.  Patient resting in supine position in bed at close of session with bed alarm set and call bell in reach.    OT Evaluation Precautions/Restrictions  Precautions Precautions: Fall;Back Restrictions Weight Bearing Restrictions: No General Chart Reviewed: Yes OT Amount of Missed Time: 15 Minutes PT Missed Treatment Reason: Patient ill (Comment) Vital Signs   Pain Pain Assessment Pain Scale: 0-10 Pain Score: 10-Worst pain ever Pain Type: Acute pain Pain Location: Head Pain Orientation: Posterior Pain Descriptors / Indicators: Aching Pain Onset: On-going Pain Intervention(s): Repositioned Home Living/Prior Functioning Home Living Family/patient expects to be discharged to:: Private residence Living Arrangements: Spouse/significant other Available Help at Discharge: Family Type of Home: House Home Access: Stairs to enter Technical brewer of Steps: 1 Home Layout: One level Bathroom Shower/Tub: Multimedia programmer: Standard  Lives With: Spouse Prior Function Level of Independence: Independent with basic ADLs, Independent with transfers, Requires assistive device for independence Comments: usine cane for functional mobility until 08/30/18 then had sudden increase and pain and decrease in function ADL ADL Eating: Other (comment)(patient needs assist at this time for cup to mouth due to pain and fatigue) Where Assessed-Eating: Bed level Grooming: Not assessed(unable to tolerate) Upper Body Bathing: Not assessed(unable to tolerate) Lower Body Bathing: Not assessed(unable to tolerate) Upper Body Dressing: Not assessed(unable to tolerate) Lower Body Dressing: Not assessed(unable to tolerate) Toileting: Not assessed(unable to tolerate) Toilet Transfer: Not assessed(unable to tolerate) Social research officer, government:  Not assessed(unable to tolerate) ADL Comments: patient able to wash his face with min A Vision Baseline Vision/History: Wears glasses Patient Visual Report: Eye fatigue/eye pain/headache;Blurring of vision Vision Assessment?: Vision impaired- to be further tested in functional context Additional Comments: patient unable to keep eyes open during session for complete vision assessment Perception  Perception: Within Functional Limits Praxis Praxis: Not tested Cognition Overall Cognitive Status: Within Functional Limits for tasks assessed Arousal/Alertness: Awake/alert Orientation Level:  Person;Place;Situation Person: Oriented Place: Oriented Situation: Oriented Year: 2020 Month: March Day of Week: Correct Memory: Appears intact Immediate Memory Recall: Sock;Blue;Bed Memory Recall: Sock;Blue Memory Recall Sock: Without Cue Memory Recall Blue: Without Cue Awareness: Appears intact Problem Solving: Appears intact Safety/Judgment: Appears intact Sensation Sensation Light Touch: Appears Intact Coordination Gross Motor Movements are Fluid and Coordinated: Not tested Fine Motor Movements are Fluid and Coordinated: Not tested Finger Nose Finger Test: unable for eval Motor  Motor Motor: Within Functional Limits Motor - Skilled Clinical Observations: moving all 4 limbs but fatigue/poor energy level limit ability to complete functional tasks at this time Mobility  Bed Mobility Bed Mobility: Supine to Sit;Sit to Supine Supine to Sit: Moderate Assistance - Patient 50-74%(extremely slow rate of movement due to exhaustion) Sit to Supine: Moderate Assistance - Patient 50-74%  Trunk/Postural Assessment  Cervical Assessment Cervical Assessment: Exceptions to [facility] Thoracic Assessment Thoracic Assessment: Exceptions to [facility] Lumbar Assessment Lumbar Assessment: Exceptions to [facility] Postural Control Postural Control: Deficits on evaluation(poor upright posture seated at edge of bed)   Balance Balance Balance Assessed: Yes Static Sitting Balance Static Sitting - Balance Support: Bilateral upper extremity supported;Feet unsupported Static Sitting - Level of Assistance: 5: Stand by assistance Static Sitting - Comment/# of Minutes: flexed posture seated edge of bed - able to maintain with CS Extremity/Trunk Assessment RUE Assessment RUE Assessment: Exceptions to [facility] Passive Range of Motion (PROM) Comments: WNLs Active Range of Motion (AROM) Comments: WFL in gravity eliminated plane General Strength Comments: poor overall strength/energy level limiting ability to lift arms for functional tasks  LUE Assessment LUE Assessment: Exceptions to [facility] Passive Range of Motion (PROM) Comments: WNLs Active Range of Motion (AROM) Comments: WFL in a gravity eliminated plane General Strength Comments: poor overall strength/energy level limiting ability to lift arms for functional tasks      Refer to Care Plan for Long Term Goals  Recommendations for other services: None    Discharge Criteria: Patient will be discharged from OT if patient refuses treatment 3 consecutive times without medical reason, if treatment goals not met, if there is a change in medical status, if patient makes no progress towards goals or if patient is discharged from hospital.  The above assessment, treatment plan, treatment alternatives and goals were discussed and mutually agreed upon: by patient  Carlos Levering 09/09/2018, 12:28 PM

## 2018-09-09 NOTE — Evaluation (Signed)
Physical Therapy Assessment and Plan  Patient Details  Name: Edward Todd MRN: [id number] Date of Birth: 08/11/1975  PT Diagnosis: Difficulty walking, Low back pain, Muscle spasms and Muscle weakness Rehab Potential: Good ELOS: 2-3 weeks   Today's Date: 09/09/2018 PT Individual Time: 1515-1540 PT Individual Time Calculation (min): 25 min  and Today's Date: 09/09/2018 PT Missed Time: 35 Minutes Missed Time Reason: Patient ill (Comment);Patient fatigue;Pain(low hemoglobin)   Problem List:  Patient Active Problem List   Diagnosis Date Noted  . HTN (hypertension) 09/05/2018  . Type 2 diabetes mellitus without complication (HCC) 09/05/2018  . Hypophosphatemia 09/05/2018  . Constipation 09/05/2018  . Anemia of chronic disease 09/05/2018  . Multiple myeloma (HCC)   . Symptomatic anemia 08/30/2018  . Acute bilateral low back pain   . Weakness of both lower extremities     Past Medical History: History reviewed. No pertinent past medical history. Past Surgical History: History reviewed. No pertinent surgical history.  Assessment & Plan Clinical Impression: Patient is a 43 y.o. year old male with  history of type II diabetes mellitus however he had stopped taking his metformin sometime ago. By report patient had an accident at work March 2020 where the oxygen tank fell on his back at the lower lumbar upper sacral area. He went to Worker's Comp. physician ordered muscle relaxer and pain medications but did not receive any imaging or further work-up. Since March he has been out of work walking with a cane with decreasing functional mobility. No bowel or bladder disturbances. He does report being treated for pneumonia approximately 1 month ago. Per chart review lives with his spouse. One level home one-step to entry. Presented 08/30/2018 with increasing back pain. 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 Subarticular zone narrowing could affect both L5 nerve roots. Chronic appearing deformities of L2 and L4 but no acute or subacute bone marrow edema to suggest a recent injury. Diffusely abnormal bone marrow signal, with subcentimeter foci of postcontrast enhancement.This felt could possibly be related to severe anemia, body habitus but a marrow replacement process could not be excluded. Hematology service is consulted with noted hemoglobin 7.6, RBCs 2.52 as well as hyperproteinemia.. A bone survey showed lesions in the scapula bilaterally suggesting possible multiple myeloma. IR was consulted for possible vertebroplasty kyphoplasty however this was not recommended per IR team. A bone marrow biopsy was performed consistent with multiple myeloma. He was started on weekly Velcade and dexamethasone 09/05/2018. Subcutaneous Lovenox for DVT prophylaxis. Findings of elevated hemoglobin A1c of 9.4 placed on sliding scale insulin. Therapy evaluations completed and patient was admitted for a comprehensive rehab program..  Patient transferred to CIR on 09/07/2018 .   Patient currently requires total with mobility secondary to muscle weakness, decreased cardiorespiratoy endurance and decreased sitting balance, decreased standing balance and difficulty maintaining precautions.  Prior to hospitalization, patient was modified independent  with mobility and lived with Spouse in a House home.  Home access is 1Stairs to enter.  Patient will benefit from skilled PT intervention to maximize safe functional mobility, minimize fall risk and decrease caregiver burden for planned discharge home with 24 hour assist.  Anticipate patient will benefit from follow up HH at discharge.  PT - End of Session Activity Tolerance: Decreased this session;Tolerates < 10 min activity, no significant change in vital signs Endurance Deficit: Yes Endurance Deficit Description: pt requiring increased time  for any extremity movements   while laying supine in bed, unable to tolerate OOB actiivty this session secondary to fatigue PT Assessment Rehab Potential (ACUTE/IP ONLY): Good PT Barriers to Discharge: Medical stability;Pending chemo/radiation PT Patient demonstrates impairments in the following area(s): Balance;Endurance;Motor;Pain;Safety;Nutrition;Skin Integrity;Edema PT Transfers Functional Problem(s): Bed Mobility;Bed to Chair;Car;Furniture PT Locomotion Functional Problem(s): Ambulation;Wheelchair Mobility;Stairs PT Plan PT Intensity: Minimum of 1-2 x/day ,45 to 90 minutes PT Frequency: 5 out of 7 days PT Treatment/Interventions: Cognitive remediation/compensation;Ambulation/gait training;Discharge planning;DME/adaptive equipment instruction;Functional mobility training;Pain management;Psychosocial support;Splinting/orthotics;Therapeutic Activities;UE/LE Strength taining/ROM;Visual/perceptual remediation/compensation;Balance/vestibular training;Community reintegration;Disease management/prevention;Functional electrical stimulation;Patient/family education;Neuromuscular re-education;Skin care/wound management;Stair training;Therapeutic Exercise;UE/LE Coordination activities;Wheelchair propulsion/positioning PT Transfers Anticipated Outcome(s): min assist PT Locomotion Anticipated Outcome(s): min assist PT Recommendation Recommendations for Other Services: Neuropsych consult Follow Up Recommendations: Home health PT Patient destination: Home Equipment Recommended: To be determined  Skilled Therapeutic Intervention Evaluation completed (see details above and below) with education on PT POC and goals. Pt supine in bed upon PT arrival, agreeable to therapy tx and reports back pain 8/10 this session. Therapist provided repositioning in the bed with use of pillows to help ease pain and muscle spasms in pt's back. Pt reports feeling too tired this session to try sitting EOB. Therapist and pt discussed  therapy goals and projected length of stay for rehab. Pt with questions regarding the transition home from hospital, this therapist educated pt on our goals and expected assist needed from family upon d/c as well as home health to continue therapy once pt is ready to d/c home. Pt asking about chemotherapy tx, therapist deferred these questions to oncology who will be seeing the pt tomorrow. Pt able to perform UE/LE movements against gravity however fatigues very quickly and is unable to hold movements against physical resistance. Pt donned his glasses with supervision, increased time secondary to UE fatigue and weakness. Pt left supine in bed at end of session, needs in reach and bed alarm set. Pt missed 35 minutes of skilled therapy tx secondary to fatigue/pain and low hemoglobin limiting his ability to participate.    PT Evaluation Precautions/Restrictions Precautions Precautions: Fall;Back Precaution Comments: back precautions/logroll Restrictions Weight Bearing Restrictions: No General PT Missed Treatment Reason: Patient ill (Comment) Vital SignsTherapy Vitals Temp: 98.9 F (37.2 C) Temp Source: Oral Pulse Rate: 92 Resp: 20 BP: 108/83 Oxygen Therapy SpO2: 99 % O2 Device: Room Air Pain Pain Assessment Pain Scale: 0-10 Pain Score: 8  Pain Type: Acute pain Pain Location: Back Pain Orientation: Posterior Pain Descriptors / Indicators: Aching Pain Frequency: Occasional Pain Onset: On-going Pain Intervention(s): Medication (See eMAR) Home Living/Prior Functioning Home Living Available Help at Discharge: Family Type of Home: House Home Access: Stairs to enter Technical brewer of Steps: 1 Home Layout: One level Bathroom Shower/Tub: Multimedia programmer: Standard  Lives With: Spouse Prior Function Level of Independence: Independent with basic ADLs;Independent with transfers;Requires assistive device for independence Vocation: Workers comp Comments: usine cane  for functional mobility until 08/30/18 then had sudden increase and pain and decrease in function Vision/Perception  Vision - Assessment Additional Comments: patient unable to keep eyes open during session for complete vision assessment Perception Perception: Within Functional Limits Praxis Praxis: Not tested  Cognition Overall Cognitive Status: Within Functional Limits for tasks assessed Arousal/Alertness: Awake/alert Orientation Level: Oriented to place;Oriented to person;Oriented to situation Memory: Appears intact Awareness: Appears intact Problem Solving: Appears intact Safety/Judgment: Appears intact Sensation Sensation Light Touch: Appears Intact Additional Comments: (sensation appears grossly intact B LEs) Coordination Gross Motor Movements are Fluid and Coordinated: Not tested(coordination unable to formally assess,  appears limited secondary to pain and fatigue) Fine Motor Movements are Fluid and Coordinated: Not tested Finger Nose Finger Test: unable for eval Heel Shin Test: unable for eval Motor  Motor Motor: Within Functional Limits Motor - Skilled Clinical Observations: moving all 4 limbs but fatigue/poor energy level limit ability to complete functional tasks at this time  Mobility Bed Mobility Bed Mobility: (unable to assess on eval secondary to fatigue/low hemoglobin and pain) Transfers Transfers: (unable to perform transfer on eval secondary to fatigue/low hemoglobin and pain) Locomotion  Gait Ambulation: No(unable assess gait on eval secondary to fatigue/low hemoglobin and pain) Stairs / Additional Locomotion Stairs: No(unable to assess on eval secondary to fatigue/low hemoglobin and pain)  Trunk/Postural Assessment  Cervical Assessment Cervical Assessment: Within Functional Limits Thoracic Assessment Thoracic Assessment: Within Functional Limits Lumbar Assessment Lumbar Assessment: Within Functional Limits Postural Control Postural Control: Deficits on  evaluation  Balance Balance Balance Assessed: No(unable to assess on eval secondary to fatigue/low hemoglobin and pain) Static Sitting Balance Static Sitting - Balance Support: Bilateral upper extremity supported;Feet unsupported Static Sitting - Level of Assistance: 5: Stand by assistance Static Sitting - Comment/# of Minutes: flexed posture seated edge of bed - able to maintain with CS Extremity Assessment  RUE Assessment RUE Assessment: Exceptions to [facility] Passive Range of Motion (PROM) Comments: WNLs Active Range of Motion (AROM) Comments: WFL in gravity eliminated plane General Strength Comments: poor overall strength/energy level limiting ability to lift arms for functional tasks  LUE Assessment LUE Assessment: Exceptions to [facility] Passive Range of Motion (PROM) Comments: WNLs Active Range of Motion (AROM) Comments: WFL in a gravity eliminated plane General Strength Comments: poor overall strength/energy level limiting ability to lift arms for functional tasks  RLE Assessment RLE Assessment: Exceptions to [facility] Passive Range of Motion (PROM) Comments: [facility] General Strength Comments: pt able to move LE against gravity with increased time secondary to fatigue. Unable to hold against resistance RLE Strength Right Hip Flexion: 3/5 Right Hip Extension: 3/5 Right Hip ABduction: 3/5 Right Knee Flexion: 3/5 Right Knee Extension: 3-/5 Right Ankle Dorsiflexion: 3+/5 Right Ankle Plantar Flexion: 3+/5 LLE Assessment LLE Assessment: Exceptions to [facility] Passive Range of Motion (PROM) Comments: [facility] General Strength Comments: pt able to move LE against gravity with increased time secondary to fatigue. Unable to hold against resistance LLE Strength Left Hip Flexion: 3/5 Left Hip Extension: 3/5 Left Hip ABduction: 3/5 Left Knee Flexion: 3/5 Left Knee Extension: 3-/5 Left Ankle Dorsiflexion: 3+/5 Left Ankle Plantar Flexion: 3+/5    Refer to Care Plan for Long Term Goals  Recommendations for  other services: Neuropsych  Discharge Criteria: Patient will be discharged from PT if patient refuses treatment 3 consecutive times without medical reason, if treatment goals not met, if there is a change in medical status, if patient makes no progress towards goals or if patient is discharged from hospital.  The above assessment, treatment plan, treatment alternatives and goals were discussed and mutually agreed upon: by patient  Netta Corrigan, PT, DPT 09/09/2018, 3:55 PM

## 2018-09-10 ENCOUNTER — Inpatient Hospital Stay (HOSPITAL_COMMUNITY): Payer: 59 | Admitting: Occupational Therapy

## 2018-09-10 ENCOUNTER — Inpatient Hospital Stay (HOSPITAL_COMMUNITY): Payer: 59 | Admitting: Physical Therapy

## 2018-09-10 DIAGNOSIS — D649 Anemia, unspecified: Secondary | ICD-10-CM

## 2018-09-10 DIAGNOSIS — G479 Sleep disorder, unspecified: Secondary | ICD-10-CM

## 2018-09-10 DIAGNOSIS — R509 Fever, unspecified: Secondary | ICD-10-CM

## 2018-09-10 DIAGNOSIS — E669 Obesity, unspecified: Secondary | ICD-10-CM

## 2018-09-10 DIAGNOSIS — E119 Type 2 diabetes mellitus without complications: Secondary | ICD-10-CM

## 2018-09-10 DIAGNOSIS — K5900 Constipation, unspecified: Secondary | ICD-10-CM

## 2018-09-10 DIAGNOSIS — R5381 Other malaise: Secondary | ICD-10-CM

## 2018-09-10 DIAGNOSIS — E871 Hypo-osmolality and hyponatremia: Secondary | ICD-10-CM

## 2018-09-10 DIAGNOSIS — E1169 Type 2 diabetes mellitus with other specified complication: Secondary | ICD-10-CM

## 2018-09-10 LAB — OCCULT BLOOD X 1 CARD TO LAB, STOOL: Fecal Occult Bld: NEGATIVE

## 2018-09-10 LAB — TYPE AND SCREEN
ABO/RH(D): O POS
Antibody Screen: NEGATIVE
Unit division: 0

## 2018-09-10 LAB — CBC WITH DIFFERENTIAL/PLATELET
Abs Immature Granulocytes: 0.01 10*3/uL (ref 0.00–0.07)
Basophils Absolute: 0 10*3/uL (ref 0.0–0.1)
Basophils Relative: 0 %
Eosinophils Absolute: 0 10*3/uL (ref 0.0–0.5)
Eosinophils Relative: 0 %
HCT: 22.3 % — ABNORMAL LOW (ref 39.0–52.0)
Hemoglobin: 7.7 g/dL — ABNORMAL LOW (ref 13.0–17.0)
Immature Granulocytes: 0 %
Lymphocytes Relative: 37 %
Lymphs Abs: 1.8 10*3/uL (ref 0.7–4.0)
MCH: 30.4 pg (ref 26.0–34.0)
MCHC: 34.5 g/dL (ref 30.0–36.0)
MCV: 88.1 fL (ref 80.0–100.0)
Monocytes Absolute: 0.3 10*3/uL (ref 0.1–1.0)
Monocytes Relative: 7 %
Neutro Abs: 2.6 10*3/uL (ref 1.7–7.7)
Neutrophils Relative %: 56 %
Platelets: 195 10*3/uL (ref 150–400)
RBC: 2.53 MIL/uL — ABNORMAL LOW (ref 4.22–5.81)
RDW: 13.7 % (ref 11.5–15.5)
WBC: 4.8 10*3/uL (ref 4.0–10.5)
nRBC: 0 % (ref 0.0–0.2)

## 2018-09-10 LAB — BASIC METABOLIC PANEL
Anion gap: 6 (ref 5–15)
BUN: 13 mg/dL (ref 6–20)
CO2: 20 mmol/L — ABNORMAL LOW (ref 22–32)
Calcium: 7.4 mg/dL — ABNORMAL LOW (ref 8.9–10.3)
Chloride: 103 mmol/L (ref 98–111)
Creatinine, Ser: 1.09 mg/dL (ref 0.61–1.24)
GFR calc Af Amer: >60 mL/min (ref >60)
GFR calc non Af Amer: >60 mL/min (ref >60)
Glucose, Bld: 94 mg/dL (ref 70–99)
Potassium: 3.7 mmol/L (ref 3.5–5.1)
Sodium: 129 mmol/L — ABNORMAL LOW (ref 135–145)

## 2018-09-10 LAB — BPAM RBC
Blood Product Expiration Date: 202006202359
ISSUE DATE / TIME: 202005171402
Unit Type and Rh: 5100

## 2018-09-10 LAB — GLUCOSE, CAPILLARY
Glucose-Capillary: 86 mg/dL (ref 70–99)
Glucose-Capillary: 102 mg/dL — ABNORMAL HIGH (ref 70–99)
Glucose-Capillary: 121 mg/dL — ABNORMAL HIGH (ref 70–99)
Glucose-Capillary: 102 mg/dL — ABNORMAL HIGH (ref 70–99)

## 2018-09-10 MED ORDER — MIRTAZAPINE 15 MG PO TABS
15.0000 mg | ORAL_TABLET | Freq: Every day | ORAL | Status: DC
  Administered 2018-09-10 – 2018-09-24 (×14): 15 mg via ORAL
  Filled 2018-09-10 (×15): qty 1

## 2018-09-10 MED ORDER — SODIUM CHLORIDE 0.9% FLUSH
10.0000 mL | INTRAVENOUS | Status: DC | PRN
  Administered 2018-09-11 – 2018-09-21 (×4): 10 mL
  Filled 2018-09-10 (×4): qty 40

## 2018-09-10 MED ORDER — SODIUM CHLORIDE 0.9% FLUSH
10.0000 mL | Freq: Two times a day (BID) | INTRAVENOUS | Status: DC
  Administered 2018-09-10 – 2018-09-25 (×22): 10 mL

## 2018-09-10 NOTE — Progress Notes (Signed)
Edward Todd   DOB:05-10-1975   LF#:[id number]   HEN#:[id number]  Hem/Onc follow up   Subjective: Pt was transferred to [facility] inpatient rehab last week, tolerating physical therapy moderately well, has not been able to walk much, but doing to size in chairs.  Pain is moderately controlled.  He denies any signs of bleeding, last bowel movement was normal for 4 days ago.  He received 1 unit of blood yesterday for hemoglobin 6.8. He also had an episode of fever a few days ago, on iv Levaquin.    Objective:  Vitals:   09/09/18 1927 09/10/18 0552  BP: 106/76 127/87  Pulse: 91 94  Resp: 16 18  Temp: 98.3 F (36.8 C) 98.6 F (37 C)  SpO2: 100% 97%    Body mass index is 35.53 kg/m.  Intake/Output Summary (Last 24 hours) at 09/10/2018 1742 Last data filed at 09/10/2018 0500 Gross per 24 hour  Intake 4404.28 ml  Output 1600 ml  Net 2804.28 ml     Sclerae unicteric  Oropharynx clear  No peripheral adenopathy  Lungs clear -- no rales or rhonchi  Heart regular rate and rhythm  Abdomen benign    CBG (last 3)  Recent Labs    09/10/18 0629 09/10/18 1159 09/10/18 1732  GLUCAP 86 102* 121*    Urine Studies No results for input(s): UHGB, CRYS in the last 72 hours.  Invalid input(s): UACOL, UAPR, USPG, UPH, UTP, UGL, UKET, UBIL, UNIT, UROB, ULEU, UEPI, UWBC, [name], [name], [name]  Basic Metabolic Panel: Recent Labs  Lab 09/04/18 0518 09/05/18 0727 09/06/18 3614 09/07/18 0654 09/08/18 0726 09/09/18 0735 09/09/18 1821 09/10/18 0703  NA 132* 132* 129* 131* 127* 130* 130* 129*  K 3.6 3.8 3.9 3.7 3.2* 3.3* 3.5 3.7  CL 98 96* 99 102 95* 99 102 103  CO2 24 24 19* 21* 20* 20* 19* 20*  GLUCOSE 108* 124* 186* 174* 113* 108* 104* 94  BUN 19 25* 30* 37* _0 CREATININE 1.28* 1.42* 1.44* 1.34* 1.28* 1.34* 1.17 1.09  CALCIUM 10.3 10.2 9.5 8.5* 7.9* 7.4* 7.3* 7.4*  MG 1.9 2.0 1.9  --   --   --   --   --   PHOS 4.5 3.7 3.7  --   --   --   --   --     GFR Estimated Creatinine Clearance: 126.9 mL/min (by C-G formula based on SCr of 1.09 mg/dL). Liver Function Tests: Recent Labs  Lab 09/05/18 0727 09/06/18 0614 09/08/18 0726 09/09/18 0735 09/09/18 1821  AST _1 ALT _2 ALKPHOS 56 51 48 45 45  BILITOT 0.4 0.6 0.6 0.5 0.6  PROT >12.0* >12.0* 11.2* 10.9* 11.3*  ALBUMIN 2.8* 2.8* 2.3* 2.0* 2.1*   No results for input(s): LIPASE, AMYLASE in the last 168 hours. No results for input(s): AMMONIA in the last 168 hours. Coagulation profile No results for input(s): INR, PROTIME in the last 168 hours.  CBC: Recent Labs  Lab 09/06/18 0614 09/07/18 0654 09/08/18 0726 09/08/18 1525 09/09/18 0735 09/09/18 1821 09/10/18 0703  WBC 6.0 3.9* 2.7* 2.8* 3.1* 3.3* 4.8  NEUTROABS 3.7 2.9  --  1.9  --  1.5* 2.6  HGB 7.9* 7.2* 6.8* 6.9* 6.3* 7.6* 7.7*  HCT 23.5* 22.5* 20.1* 20.3* 18.6* 22.7* 22.3*  MCV 90.0 93.4 87.8 87.1 89.0 89.0 88.1  PLT 210 191 176 169 165 171 195   Cardiac Enzymes: No  results for input(s): CKTOTAL, CKMB, CKMBINDEX, TROPONINI in the last 168 hours. BNP: Invalid input(s): POCBNP CBG: Recent Labs  Lab 09/09/18 1213 09/09/18 1725 09/10/18 0629 09/10/18 1159 09/10/18 1732  GLUCAP 102* 95 86 102* 121*   D-Dimer Recent Labs    09/08/18 0846  DDIMER 0.56*   Hgb A1c No results for input(s): HGBA1C in the last 72 hours. Lipid Profile No results for input(s): CHOL, HDL, LDLCALC, TRIG, CHOLHDL, LDLDIRECT in the last 72 hours. Thyroid function studies No results for input(s): TSH, T4TOTAL, T3FREE, THYROIDAB in the last 72 hours.  Invalid input(s): FREET3 Anemia work up No results for input(s): VITAMINB12, FOLATE, FERRITIN, TIBC, IRON, RETICCTPCT in the last 72 hours. Microbiology Recent Results (from the past 240 hour(s))  Culture, blood (single) w Reflex to ID Panel     Status: None (Preliminary result)   Collection Time: 09/08/18  8:45 AM  Result Value Ref Range Status    Specimen Description BLOOD RIGHT ANTECUBITAL  Final   Special Requests   Final    BOTTLES DRAWN AEROBIC AND ANAEROBIC Blood Culture adequate volume   Culture   Final    NO GROWTH 2 DAYS Performed at [facility], [address] [place], [place] [address]    Report Status PENDING  Incomplete  Culture, Urine     Status: Abnormal   Collection Time: 09/08/18  1:42 PM  Result Value Ref Range Status   Specimen Description URINE, CLEAN CATCH  Final   Special Requests NONE  Final   Culture (A)  Final    <10,000 COLONIES/mL INSIGNIFICANT GROWTH Performed at [facility], [address] [place], [place] [address]    Report Status 09/09/2018 FINAL  Final      Studies:  No results found.  Assessment: 43 y.o.  African-American male, without past medical history, presented with debilitating low back pain,and moderate anemia.  1.Multiple myeloma, IgG Kappa, stage III 2.Moderate anemia, worsening after chemo, 1u PRBC on 5/17 3. Type 2 diabetes  4. Severe low back pain, from thoracic and lumbar compression fracture, lumbar spine stenosis  5. Neutropenia and fever, resolved now  6. Hyponatremia  7.    Plan:  -his worsening anemia is probably related to Velcade injection last week, received 1 unit of blood.  I will check stool to rule out occult bleeding. -His infection work-up was negative, will defer to primary team for the duration of antibiotics -plan to give second week chemo dexa and Velcade on 5/20, we will order  -will start Revlimid when we receive the medication from mail  -I encourage him to continue rehab -His BG has been controlled, continue metformin  -will f/u    Truitt Merle, MD 09/10/2018  5:42 PM

## 2018-09-10 NOTE — Progress Notes (Signed)
LATE ENTRY for PT visit on Friday, Sep 07, 2018  Physical Therapy Treatment Patient Details Name: Edward Todd MRN: [id number] DOB: Nov 01, 1975 Today's Date: 09/10/2018    History of Present Illness 43 yo male admitted with symptomatic anemia, weakness, falls at home. Workup for possible multiple myeloma-oncology following. Hx of age indeterminate thoracic and lumbar compression fractures.     PT Comments    Attempted OOB activity however unable to even achieve full upright EOB due to MAX back spasms.  Pt only able to clear heels off bed with extended time and partial sidelying sit.  Extended time was needed to attempt spasms to decrease.  Returned to supine with HOB elevated and pt requested meds.    Follow Up Recommendations  CIR     Equipment Recommendations  None recommended by PT    Recommendations for Other Services Rehab consult     Precautions / Restrictions Precautions Precautions: Fall;Back Precaution Comments: back precautions/logroll Restrictions Weight Bearing Restrictions: No Other Position/Activity Restrictions: T3, T6 - T 10 path Fx    Mobility  Bed Mobility Overal bed mobility: Needs Assistance Bed Mobility: Rolling;Sidelying to Sit;Sit to Sidelying Rolling: Max assist;Total assist;+2 for physical assistance;+2 for safety/equipment Sidelying to sit: Max assist;Total assist;+2 for physical assistance;+2 for safety/equipment     Sit to sidelying: Max assist;Total assist;+2 for physical assistance;+2 for safety/equipment General bed mobility comments: pt was unable to achieve full EOB   Transfers                 General transfer comment: unable due to pain  Ambulation/Gait                 Stairs             Wheelchair Mobility    Modified Rankin (Stroke Patients Only)       Balance                                            Cognition Arousal/Alertness: Awake/alert Behavior During Therapy: WFL for  tasks assessed/performed Overall Cognitive Status: Within Functional Limits for tasks assessed                                        Exercises      General Comments        Pertinent Vitals/Pain Pain Assessment: 0-10 Pain Score: 9  Pain Location: back Pain Descriptors / Indicators: Grimacing;Spasm Pain Intervention(s): Monitored during session;Repositioned;Premedicated before session;Patient requesting pain meds-RN notified    Home Living                      Prior Function            PT Goals (current goals can now be found in the care plan section) Progress towards PT goals: Progressing toward goals    Frequency    Min 3X/week      PT Plan Current plan remains appropriate    Co-evaluation              AM-PAC PT "6 Clicks" Mobility   Outcome Measure  Help needed turning from your back to your side while in a flat bed without using bedrails?: A Lot Help needed moving from lying on your back to sitting on the side  of a flat bed without using bedrails?: A Lot Help needed moving to and from a bed to a chair (including a wheelchair)?: Total Help needed standing up from a chair using your arms (e.g., wheelchair or bedside chair)?: Total Help needed to walk in hospital room?: Total Help needed climbing 3-5 steps with a railing? : Total 6 Click Score: 8    End of Session Equipment Utilized During Treatment: Gait belt Activity Tolerance: Patient limited by pain Patient left: in bed Nurse Communication: Mobility status PT Visit Diagnosis: Pain;Difficulty in walking, not elsewhere classified (R26.2);History of falling (Z91.81);Repeated falls (R29.6);Other abnormalities of gait and mobility (R26.89)     Time: 7322-0254 PT Time Calculation (min) (ACUTE ONLY): 30 min  Charges:                        Rica Koyanagi  PTA Acute  Rehabilitation Services Pager      [phone] Office      [phone]

## 2018-09-10 NOTE — Progress Notes (Signed)
Social Work  Social Work Assessment and Plan  Patient Details  Name: Edward Todd MRN: [id number] Date of Birth: April 17, 1976  Today's Date: 09/10/2018  Problem List:  Patient Active Problem List   Diagnosis Date Noted  . Hyponatremia   . Diabetes mellitus type 2 in obese ([place])   . Acute on chronic anemia   . Malaise   . FUO (fever of unknown origin)   . Sleep disturbance   . HTN (hypertension) 09/05/2018  . Type 2 diabetes mellitus without complication ([place]) 49/67/5916  . Hypophosphatemia 09/05/2018  . Constipation 09/05/2018  . Anemia of chronic disease 09/05/2018  . Multiple myeloma ([place])   . Symptomatic anemia 08/30/2018  . Acute bilateral low back pain   . Weakness of both lower extremities    Past Medical History: History reviewed. No pertinent past medical history. Past Surgical History: History reviewed. No pertinent surgical history. Social History:  reports that he has never smoked. He has never used smokeless tobacco. He reports that he does not drink alcohol or use drugs.  Family / Support Systems Marital Status: Married How Long?: 10 yrs Patient Roles: Spouse, Parent Spouse/Significant Other: wife, Elijiah Mickley @ [phone] Children: daughter, Lauralyn Primes (11 yrs old) and son, Janan Halter (36 yrs old) - both in the home Other Supports: mother, Gregori Abril @ [phone] - living in [place] Anticipated Caregiver: wife, Marguarite Arbour Merchant navy officer), 2 children, 12 y/o daughter Lauralyn Primes Ability/Limitations of Caregiver: Marguarite Arbour is a Pharmacist, hospital, currently working from home Caregiver Availability: 24/7 Family Dynamics: Pt speaks affectionately about his wife and children.  Wife very encouraging but worried about pt's coping with the abrupt change in his life and the lives of all family.  Social History Preferred language: English Religion:  Cultural Background: NA Read: Yes Write: Yes Employment Status: Employed Name of Employer: Environmental consultant of Employment:  2(yrs) Return to Work Plans: TBD - concerning given the extent of his deficits and seriousness of diagnosis. Legal History/Current Legal Issues: None Guardian/Conservator: None - per MD, pt is capable of making decisions on his own behalf.   Abuse/Neglect Abuse/Neglect Assessment Can Be Completed: Yes Physical Abuse: Denies Verbal Abuse: Denies Sexual Abuse: Denies Exploitation of patient/patient's resources: Denies Self-Neglect: Denies  Emotional Status Pt's affect, behavior and adjustment status: Pt sitting up in chair and agreeable to assessment interview, however, appears very fatigued.  He was able to provide basic information.  He was very focused on telling of his experience and frustration with worker's comp MD when he first sought medical help.  Repetetive with his stories and required some redirection to complete interview.  Affect very flat and voice was low and difficult to hear at times.  He admits he is "depressed" about his situation and worried for future.  Will refer for neuropsychology to see for additional emotional support and guidance.  Recent Psychosocial Issues: None Psychiatric History: None Substance Abuse History: None  Patient / Family Perceptions, Expectations & Goals Pt/Family understanding of illness & functional limitations: Pt states he knows, "I have a type of cancer...myeloma".  Wife concerned that he doesn't fully understand the severity of this diagnosis or expected treatment plans moving forward.  Wife states that she has been "looking online and reading about it..."  Both will benefit from further education by medical team. Premorbid pt/family roles/activities: Pt was independent and working f/t.  Wife states that pt and his son are "inseperable...his Daddy is everything to him." Anticipated changes in roles/activities/participation: Wife aware and prepared to provide caregiver support. Pt/family  expectations/goals: "I just want to get home to my  family."  [employer]: None Premorbid Home Care/DME Agencies: None Transportation available at discharge: yes Resource referrals recommended: Neuropsychology, Support group (specify)  Discharge Planning Living Arrangements: Children, Spouse/significant other Support Systems: Spouse/significant other, Children, Parent, Other relatives, Friends/neighbors Type of Residence: Private residence Insurance Resources: Multimedia programmer (specify)([employer]) Museum/gallery curator Resources: Employment Museum/gallery curator Screen Referred: No Living Expenses: Medical laboratory scientific officer Management: Spouse Does the patient have any problems obtaining your medications?: No Home Management: Pt and wife sharing responsibilities Patient/Family Preliminary Plans: Pt will return home with wife and children.  Wife to provide all assistance needed. Daughter able to assist as well. Social Work Anticipated Follow Up Needs: HH/OP Expected length of stay: 2 weeks  Clinical Impression Unfortunate gentleman here for functional decline due to newly diagnosed multiple myeloma.  Symptoms originally thought to be due to a work related injury.  Pt and wife admit they are still "trying to grasp" this new diagnosis and how it is affecting their family.  Wife and family very supportive and are encouraging to one another.  Prepared to provide 24/7 care at home.    [place], [place] 09/10/2018, 4:27 PM

## 2018-09-10 NOTE — Progress Notes (Signed)
Physical Therapy Session Note  Patient Details  Name: Edward Todd MRN: [id number] Date of Birth: 12-13-1975  Today's Date: 09/10/2018 PT Individual Time: 5997-7414 AND 1400-1455 PT Individual Time Calculation (min): 40 min AND 55 min  Short Term Goals: Week 1:  PT Short Term Goal 1 (Week 1): Pt will perform bed mobility with min assist PT Short Term Goal 2 (Week 1): Pt will perform sit<>stand with mod assist PT Short Term Goal 3 (Week 1): Pt will initiate transfer training from bed<>w/c PT Short Term Goal 4 (Week 1): Pt will tolerate >30 min of OOB activity to participate in therapy   Skilled Therapeutic Interventions/Progress Updates:   Session 1:  Pt in supine, receiving medications from RN and agreeable to therapy, pain 4/10 in back (premedicated). Pt requesting to toilet, reports feeling overall better this morning than yesterday, pt alert and conversing with this therapist. Transferred to sitting EOB via log-roll technique and max assist overall w/ increased time. Pt moves very slow and guarded w/ all movement 2/2 pain and fear of movement. Verbal and tactile cues for technique and to utilize UE support to offset load on his back musculature. Static sitting at EOB for a few minutes w/ close supervision. Stedy transfer to [name], min assist w/ increased time to stand from elevated bed surface. Verbal and tactile cues to extend knees/hips into full upright standing. Able to stand enough to put stedy flaps down, but unable to reach full upright stance 2/2 pain. Total assist for LE garment management for time, donned new pair of shorts as well. Sat up on [facility] w/ back supported by pillows for 10+ minutes while therapist changed bed linens as they had been soaked by sweat. Ended session sitting on BSC, call bell within reach and all needs met. NT made aware of pt's status. Missed 20 min of skilled PT 2/2 toileting.   Session 2:  Pt in supine and agreeable to therapy, pain 4/10 in back  (premedicated). Pt requesting something to eat as he did not care for what he ordered for lunch. Supine>sit w/ min assist and increased time 2/2 pain w/ transitional movements. Sat EOB for 10 minutes while eating crackers and ice cream w/ supervision, heavy reliance on UE support in sitting, encouraged pt to use postural control musculature to hold himself up. Discussed home set-up/access, PLOF, and LTGs as pt was able to participate minimally in yesterday's evaluation. Pt agreeable to attempt gait w/ RW. Sit<>stands w/ min assist and ambulated 5' x2 w/ prolonged seated rest break in between gait bouts 2/2 fatigue. Pt needed multiple attempts at sit<>stands 2/2 pain and heavy UE reliance w/ all transitional movements. Pt states he fell multiple times prior to admission and is afraid of falling again. Pt agreeable to sit up in recliner at end of session. Ended session in recliner, all needs in reach.   Therapy Documentation Precautions:  Precautions Precautions: Fall, Back Precaution Comments: back precautions/logroll Restrictions Weight Bearing Restrictions: No Other Position/Activity Restrictions: T3, T6 - T 10 path Fx Vital Signs: Therapy Vitals Temp: 98.6 F (37 C) Temp Source: Oral Pulse Rate: 94 Resp: 18 BP: 127/87 Patient Position (if appropriate): Lying Oxygen Therapy SpO2: 97 % O2 Device: Room Air  Therapy/Group: Individual Therapy  Edward Todd [name] 09/10/2018, 8:52 AM

## 2018-09-10 NOTE — Telephone Encounter (Signed)
Oral Oncology Patient Advocate Encounter  Debby from [facility] emailed me to see if there was a different number to reach the patient, she cannot get him on his cell.  I called the patients wife and she gave me his room phone number at the Inpatient rehab facility.  I shared this information with Debby.  This encounter will continue to be updated until final determination.  [place] Patient [place] Phone [phone] Fax [phone] 09/10/2018   11:03 AM

## 2018-09-10 NOTE — IPOC Note (Addendum)
Individualized overall Plan of Care [facility]) Patient Details Name: Edward Todd MRN: [id number] DOB: 1975/10/02  Admitting Diagnosis: <principal problem not specified>  Hospital Problems: Active Problems:   Multiple myeloma ([place])   Hyponatremia   Diabetes mellitus type 2 in obese ([place])   Acute on chronic anemia   Malaise   FUO (fever of unknown origin)   Sleep disturbance     Functional Problem List: Nursing Endurance, Motor, Pain, Safety  PT Balance, Endurance, Motor, Pain, Safety, Nutrition, Skin Integrity, Edema  OT Balance, Endurance, Pain, Vision  SLP    TR         Basic ADL's: OT Eating, Grooming, Bathing, Dressing, Toileting     Advanced  ADL's: OT Simple Meal Preparation     Transfers: PT Bed Mobility, Bed to Chair, Car, Manufacturing systems engineer, Metallurgist: PT Ambulation, Emergency planning/management officer, Stairs     Additional Impairments: OT Fuctional Use of Upper Extremity  SLP        TR      Anticipated Outcomes Item Anticipated Outcome  Self Feeding Independent  Swallowing      Basic self-care  CG/min A  Toileting  CG A   Bathroom Transfers min A  Bowel/Bladder  Patient continue to be continent of blowel and bladder during admission  Transfers  min assist  Locomotion  min assist  Communication     Cognition     Pain  Patient to be pain free or pain less than 3 during admission  Safety/Judgment  Patient will be free from falls and adhere to safety plan   Therapy Plan: PT Intensity: Minimum of 1-2 x/day ,45 to 90 minutes PT Frequency: 5 out of 7 days PT Duration Estimated Length of Stay: 2-3 weeks OT Intensity: Minimum of 1-2 x/day, 45 to 90 minutes OT Frequency: Total of 15 hours over 7 days of combined therapies OT Duration/Estimated Length of Stay: 2 weeks      Team Interventions: Nursing Interventions Pain Management, Psychosocial Support, Disease Management/Prevention  PT interventions Cognitive  remediation/compensation, Ambulation/gait training, Discharge planning, DME/adaptive equipment instruction, Functional mobility training, Pain management, Psychosocial support, Splinting/orthotics, Therapeutic Activities, UE/LE Strength taining/ROM, Visual/perceptual remediation/compensation, Training and development officer, Community reintegration, Disease management/prevention, Technical sales engineer stimulation, Patient/family education, Neuromuscular re-education, Skin care/wound management, Stair training, Therapeutic Exercise, UE/LE Coordination activities, Wheelchair propulsion/positioning  OT Interventions Training and development officer, Discharge planning, Pain management, Self Care/advanced ADL retraining, Therapeutic Activities, UE/LE Coordination activities, Functional mobility training, Patient/family education, Therapeutic Exercise, Visual/perceptual remediation/compensation, Academic librarian, Engineer, drilling, UE/LE Strength taining/ROM  SLP Interventions    TR Interventions    SW/CM Interventions Discharge Planning, Psychosocial Support, Patient/Family Education   Barriers to Discharge MD  Medical stability  Nursing      PT Medical stability, Pending chemo/radiation    OT Medical stability critically low hemoglobin and recent temps over 102  SLP      SW       Team Discharge Planning: Destination: PT-Home ,OT- Home , SLP-  Projected Follow-up: PT-Home health PT, OT-  Home health OT, SLP-  Projected Equipment Needs: PT-To be determined, OT- 3 in 1 bedside comode, Tub/shower seat, SLP-  Equipment Details: PT- , OT-  Patient/family involved in discharge planning: PT- Patient,  OT-Patient, SLP-   MD ELOS: 12-15 days Medical Rehab Prognosis:  Good Assessment: 43 year old right-handed male with with history of type II diabetes mellitus however he had stopped taking his metformin sometime ago. By report patient had an accident at work March 2020  where the oxygen  tank fell on his back at the lower lumbar upper sacral area. He went to [facility] physician ordered muscle relaxer and pain medications but did not receive any imaging or further work-up. Since March he has been out of work walking with a cane with decreasing functional mobility. No bowel or bladder disturbances. He does report being treated for pneumonia approximately 1 month ago. Per chart review lives with his spouse. One level home one-step to entry. Presented 08/30/2018 with increasing back pain. 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 Subarticular zone narrowing could affect both L5 nerve roots. Chronic appearing deformities of L2 and L4 but no acute or subacute bone marrow edema to suggest a recent injury. Diffusely abnormal bone marrow signal, with subcentimeter foci of postcontrast enhancement.This felt could possibly be related to severe anemia, body habitus but a marrow replacement process could not be excluded. Hematology service is consulted with noted hemoglobin 7.6, RBCs 2.52 as well as hyperproteinemia.. A bone survey showed lesions in the scapula bilaterally suggesting possible multiple myeloma. IR was consulted for possible vertebroplasty kyphoplasty however this was not recommended per IR team. A bone marrow biopsy was performed consistent with multiple myeloma. He was started on weekly Velcade and dexamethasone 09/05/2018. Findings of elevated hemoglobin A1c of 9.4 placed on sliding scale insulin.  Patient with resulting functional deficits with mobility, endurance, self-care.  We will set goals for supervision/min a with PT/OT.  Due to the current state of emergency, patients may not be receiving their 3-hours of Medicare-mandated therapy.  See Team Conference Notes for weekly updates to the plan of care

## 2018-09-10 NOTE — Progress Notes (Signed)
Inpatient Rehabilitation  Patient information reviewed and entered into eRehab system by Sean Macwilliams M. Gumecindo Hopkin, M.A., CCC/SLP, PPS Coordinator.  Information including medical coding, functional ability and quality indicators will be reviewed and updated through discharge.    

## 2018-09-10 NOTE — Progress Notes (Addendum)
[place] PHYSICAL MEDICINE & REHABILITATION PROGRESS NOTE  Subjective/Complaints: Patient seen laying in bed this morning.  He states he did not sleep well overnight and would like something to help him sleep.  He states he had a fair weekend.  Discussed with heme/onc activity tolerance.  He was transfused over the weekend and started on IV antibiotics.  ROS: Denies CP, shortness of breath, nausea, vomiting, diarrhea.  Objective: Vital Signs: Blood pressure 127/87, pulse 94, temperature 98.6 F (37 C), temperature source Oral, resp. rate 18, height 6' 3.51" (1.918 m), weight 130.7 kg, SpO2 97 %. No results found. Recent Labs    09/09/18 1821 09/10/18 0703  WBC 3.3* 4.8  HGB 7.6* 7.7*  HCT 22.7* 22.3*  PLT 171 195   Recent Labs    09/09/18 1821 09/10/18 0703  NA 130* 129*  K 3.5 3.7  CL 102 103  CO2 19* 20*  GLUCOSE 104* 94  BUN 14 13  CREATININE 1.17 1.09  CALCIUM 7.3* 7.4*    Physical Exam: BP 127/87 (BP Location: Left Arm)   Pulse 94   Temp 98.6 F (37 C) (Oral)   Resp 18   Ht 6' 3.51" (1.918 m)   Wt 130.7 kg   SpO2 97%   BMI 35.53 kg/m  Constitutional: No distress . Vital signs reviewed. HENT: Normocephalic.  Atraumatic. Eyes: EOMI. No discharge. Cardiovascular: No JVD. Respiratory: Normal effort. GI: Non-distended. Musc: Pain with active range of motion against resistance in extremities Neurological: Alert and oriented Good awareness of deficits.  Motor: 4-/5 throughout (pain inhibition) Skin: Skin iswarmand dry.  Psychiatric: He has anormal mood and affect. Hisbehavior is normal.Judgmentand thought contentnormal.   Assessment/Plan: 1. Functional deficits secondary to multiple myeloma which require 3+ hours per day of interdisciplinary therapy in a comprehensive inpatient rehab setting.  Physiatrist is providing close team supervision and 24 hour management of active medical problems listed below.  Physiatrist and rehab team continue to  assess barriers to discharge/monitor patient progress toward functional and medical goals  Care Tool:  Bathing  Bathing activity did not occur: Safety/medical concerns           Bathing assist       Upper Body Dressing/Undressing Upper body dressing Upper body dressing/undressing activity did not occur (including orthotics): Safety/medical concerns      Upper body assist      Lower Body Dressing/Undressing Lower body dressing    Lower body dressing activity did not occur: Safety/medical concerns       Lower body assist       Toileting Toileting    Toileting assist Assist for toileting: Set up assist Assistive Device Comment: of urinal per RN report    Transfers Chair/bed transfer  Transfers assist  Chair/bed transfer activity did not occur: Safety/medical concerns        Locomotion Ambulation   Ambulation assist   Ambulation activity did not occur: Safety/medical concerns          Walk 10 feet activity   Assist  Walk 10 feet activity did not occur: Safety/medical concerns        Walk 50 feet activity   Assist Walk 50 feet with 2 turns activity did not occur: Safety/medical concerns         Walk 150 feet activity   Assist Walk 150 feet activity did not occur: Safety/medical concerns         Walk 10 feet on uneven surface  activity   Assist Walk 10 feet  on uneven surfaces activity did not occur: Safety/medical concerns         Wheelchair     Assist Will patient use wheelchair at discharge?: Yes Type of Wheelchair: Manual Wheelchair activity did not occur: Safety/medical concerns(low hemoglobin/fatigue and pain/back spasms)         Wheelchair 50 feet with 2 turns activity    Assist    Wheelchair 50 feet with 2 turns activity did not occur: Safety/medical concerns       Wheelchair 150 feet activity     Assist Wheelchair 150 feet activity did not occur: Safety/medical concerns           Medical Problem List and Plan: 1.Decreased functional mobilitysecondary to newly diagnosed multiple myeloma.  Continue CIR - weekly Velcade and dexamethasone initiated 09/05/2018 per Heme/Onc  Notes reviewed- work trauma with work-up revealing multiple myeloma, labs reviewed 2. Antithrombotics: -DVT/anticoagulation:Subcutaneous Lovenox -antiplatelet therapy: Aspirin 81 mg daily 3. Pain Management:Oxycodone as needed -scheduled flexeril TID -kpad prn for spasms 4. Mood:Provide emotional support -antipsychotic agents: N/A 5. Neuropsych: This patientiscapable of making decisions on hisown behalf. 6. Skin/Wound Care:Routine skin checks 7. Fluids/Electrolytes/Nutrition:Routine in and outs   Remeron started 5/18 8.Acute on chronic anemia. Follow-up hematology oncology services  Hemoglobin 7.7 on 5/18 transfusion on 5/17  Recs per Heme/Onc 9. Diabetes mellitus. Hemoglobin A1c 9.4. Provide education  Metformin 500 twice daily  Relatively controlled on 5/18 10. Morbid obesity. BMI 35.53. Dietary follow-up 11. Constipation. Laxative assistance 12.  Hyponatremia  Sodium 129 on 5/18  Continue to monitor 13.  Fevers  Started on IV Levaquin on 5/16 and?   Improving 14.  Sleep disturbance  Likely exacerbated by steroids  No benefit with Ambien  Remeron started on 5/18  LOS: 3 days A FACE TO FACE EVALUATION WAS PERFORMED  Ankit [name] 09/10/2018, 9:37 AM

## 2018-09-10 NOTE — Progress Notes (Signed)
Occupational Therapy Session Note  Patient Details  Name: Edward Todd MRN: [id number] Date of Birth: [birth year]  Today's Date: 09/10/2018 OT Individual Time: 1000-1100 OT Individual Time Calculation (min): 60 min    Short Term Goals: Week 1:  OT Short Term Goal 1 (Week 1): Patient will complete bed mobility and functional transfers with mod A OT Short Term Goal 2 (Week 1): patient will tolerate OOB for 1 hour at a time and full therapy schedule OT Short Term Goal 3 (Week 1): assess visual complaints and follow up as appropriate OT Short Term Goal 4 (Week 1): Patient will complete UB bathing and dressing with min A, LB bathing and dressing with mod A  Skilled Therapeutic Interventions/Progress Updates:    Patient in bed and agreeable to participate in therapy session.  He is alert with eyes open for 60 minute session.  States that he is feeling better.  He is able to recall details from yesterday, continues with significant endurance deficit.  Bed mobility:  SSP to/from supine with min/mod A.  C/o dizziness with position change but recovers within 30 - 60 seconds.  Sit to stand min/mod A.  SPT with RW min A for walker management and steadying.  Increased time for all transitions.  He tolerated sitting in w/c for 30 minutes.  UB strength 3/5 - he continues to struggle lifting arms against gravity to reach for walker or complete functional tasks.  Completed UE AAROM 10 reps with rest breaks between.  Visual assessment:  ROM/fields/acuity with glasses intact.  Gaze stabilization, convergence, saccades and pursuits impaired - reviewed activities to assist with increased tolerance and mobility.  Patient returned to bed at close of session with bed alarm set, callbell and tray table in reach.    Therapy Documentation Precautions:  Precautions Precautions: Fall, Back Precaution Comments: back precautions/logroll Restrictions Weight Bearing Restrictions: No Other Position/Activity  Restrictions: T3, T6 - T 10 path Fx General: General PT Missed Treatment Reason: Toileting Vital Signs:   Pain: Pain Assessment Pain Scale: 0-10 Pain Score: 2  Pain Location: Back Pain Orientation: Mid Pain Descriptors / Indicators: Aching Pain Intervention(s): Repositioned   Other Treatments:     Therapy/Group: Individual Therapy  Edward Todd 09/10/2018, 12:17 PM

## 2018-09-11 ENCOUNTER — Inpatient Hospital Stay (HOSPITAL_COMMUNITY): Payer: 59 | Admitting: Occupational Therapy

## 2018-09-11 ENCOUNTER — Inpatient Hospital Stay (HOSPITAL_COMMUNITY): Payer: 59 | Admitting: Physical Therapy

## 2018-09-11 ENCOUNTER — Telehealth: Payer: Self-pay | Admitting: *Deleted

## 2018-09-11 LAB — GLUCOSE, CAPILLARY
Glucose-Capillary: 107 mg/dL — ABNORMAL HIGH (ref 70–99)
Glucose-Capillary: 121 mg/dL — ABNORMAL HIGH (ref 70–99)
Glucose-Capillary: 98 mg/dL (ref 70–99)
Glucose-Capillary: 99 mg/dL (ref 70–99)

## 2018-09-11 MED ORDER — ONDANSETRON 4 MG PO TBDP
4.0000 mg | ORAL_TABLET | Freq: Four times a day (QID) | ORAL | Status: DC | PRN
  Administered 2018-09-13 – 2018-09-14 (×2): 4 mg via ORAL
  Filled 2018-09-11 (×3): qty 1

## 2018-09-11 NOTE — Telephone Encounter (Signed)
Oral Chemotherapy Pharmacist Encounter   I spoke with patient for overview of: Revlimid (lenolidomide) for the treatment of newly diagnosed multiple myeloma not having achieve remission in conjunction with Velcade (bortezimib) and dexamethasone, planned duration 4-6 induction cycles then reassessment.   He is currently admitted at inpatient rehab facility at [facility]. His medications are being administered per his MAR.  Counseled patient on administration, dosing, side effects, monitoring, drug-food interactions, safe handling, storage, and disposal.  Patient will take Revlimid 3m capsules, 1 capsule by mouth once daily, without regard to food, with a full glass of water.  Revlimid will be given 14 days on, 7 days off, repeat every 21 days.  Patient is receiving dexamethasone 476mtablets, 10 tablets (4046mby mouth once weekly with breakfast.  Patient is receiving Velcade at 1.3 mg/m2 once weekly.  Velcade and dexamethasone start date: 09/05/18 Revlimid start date: 5/20 or 5/21  Adverse effects of Revlimid include but are not limited to: nausea, constipation, diarrhea, abdominal pain, rash, fatigue, drug fever, and decreased blood counts.    Patient informed about severe teratogenic effects of Revlimid and need for prophylaxis during intercourse, and procedure of [employer] REMS program. Patient informed he can not donate blood while on therapy.  Reviewed with patient importance of keeping a medication schedule and plan for any missed doses.  Medication reconciliation performed and medication/allergy list updated.  Patient is being administered acyclovir for VZV prophylaxis while admitted. We discussed that this medication will continue once discharged. Patient is being administered enoxaparin 87m70m once daily and aspirin 81mg64me daily for thromboprophylaxis while admitted. We discussed that the enoxaparin will likely be discontinued on discharge, and that patient will  continue on aspirin 81mg 32my as long as he is on Revlimid treatment. Importance of both of these supportive therapy measures discussed with patient.  We also briefly discussed induction medication and benefits of each of the Velcade, dexamethasone, and Revlimid.  Insurance authorization for Revlimid has been obtained.  Revlimid prescription is being dispensed from [facility] specialty pharmacy as it is a limited distribution medication and this is the dispensing pharmacy mandated by patient's insurance. Patient has been enrolled into a copayment card coupon program with [employer] to receive Revlimid at no more than $25 per fill. This is being delivered to MD office on 09/12/18 and [facility]  All questions answered.  [name] understanding and appreciation.  He asks that I contact his wife and provide counseling to her as well. This will be done on 09/12/18.  Patient knows to call the office with questions or concerns.  Edward Johny DrillingmD, BCPS, BCOP  09/11/2018    4:25 PM Oral Oncology Clinic [phone]

## 2018-09-11 NOTE — Progress Notes (Signed)
Occupational Therapy Session Note  Patient Details  Name: Edward Todd MRN: [id number] Date of Birth: [birth year]  Today's Date: 09/11/2018 OT Individual Time: 0722-5750 OT Individual Time Calculation (min): 38 min    Short Term Goals: Week 1:  OT Short Term Goal 1 (Week 1): Patient will complete bed mobility and functional transfers with mod A OT Short Term Goal 2 (Week 1): patient will tolerate OOB for 1 hour at a time and full therapy schedule OT Short Term Goal 3 (Week 1): assess visual complaints and follow up as appropriate OT Short Term Goal 4 (Week 1): Patient will complete UB bathing and dressing with min A, LB bathing and dressing with mod A  Skilled Therapeutic Interventions/Progress Updates:    Pt greeted semi-reclined in bed and agreeable to OT treatment session. Pt recalled therapy sessions from earlier today and talked about how low his endurance is and that he couldn't even clean himself up after a BM. Pt stated he has had a hard time dealing with the changes in his body, but is motivated to push through and work to get better. Utilized therapeutic listening and use of self to encourage pt and remind him of his progress. Pt came to sitting EOB with mod A and increased time. Pt needed extended rest break at EOB prior to standing. Pt attempted to stand 2x from raised surface with UEs pushing up from bed, but pt unable to achieve full hip extension. Allowed pt to pull from RW with OT holding down RW and pt able to stand. Educated on proper techniques for standing to rely more on LEs than UEs and will keep working towards this. Pt completed 10 minis squats at EOB, then worked on side stepping along EOB with increased time and min A overall. Min A to move RW along as well. Pt returned to bed at end of session with focus on controlling stand> sit and assistance to lift R LE back into bed. Pt pleased with progress this afternoon. Pt left semi-reclined in bed with bed alarm on and needs  met .  Therapy Documentation Precautions:  Precautions Precautions: Fall, Back Precaution Comments: back precautions/logroll Restrictions Weight Bearing Restrictions: No Other Position/Activity Restrictions: T3, T6 - T 10 path Fx Pain: Pain Assessment Pain Scale: 0-10 Pain Score: 4  Pain Type: Acute pain Pain Location: Back Pain Orientation: Mid Pain Descriptors / Indicators: Aching Pain Frequency: Intermittent Pain Onset: Gradual Patients Stated Pain Goal: 4 Pain Intervention(s): Repositioned   Therapy/Group: Individual Therapy  Valma Cava 09/11/2018, 3:46 PM

## 2018-09-11 NOTE — Progress Notes (Signed)
Discussed with pt's nurse: he will make sure there is a disposal bucket in the room. Pt will need CBC prior to velcade injection.

## 2018-09-11 NOTE — Telephone Encounter (Addendum)
Oral Oncology Patient Advocate Encounter  [employer] copay assistance for Revlimid has been approved through 04/25/19 or until the yearly award of $10,000 has been exhausted. The copay assistance information has been shared with [name] and is as follows.  BIN: [id number] PCN: [id number]: [id number] ID: [id number]  I spoke to the patient and gave him this information. [name] called the patient to get consent to fill the Revlimid and send an invoice for the $25 copay, he agreed.   The Revlimid will be shipped to the office on 5/19 to arrive 5/20 to Dr. Lewayne Bunting attention. Dr. Burr Medico and I spoke about this and she will deliver this to the pharmacy at [facility] while he is doing inpatient rehab.  The patient verbalized understanding and great appreciation.  Edward Todd Phone [phone] Fax [phone] 09/11/2018   9:16 AM

## 2018-09-11 NOTE — Progress Notes (Signed)
Physical Therapy Session Note  Patient Details  Name: Edward Todd MRN: [id number] Date of Birth: December 25, 1975  Today's Date: 09/11/2018 PT Individual Time: 0800-0900 AND 1300-1315 PT Individual Time Calculation (min): 60 min AND 15 min  Short Term Goals: Week 1:  PT Short Term Goal 1 (Week 1): Pt will perform bed mobility with min assist PT Short Term Goal 2 (Week 1): Pt will perform sit<>stand with mod assist PT Short Term Goal 3 (Week 1): Pt will initiate transfer training from bed<>w/c PT Short Term Goal 4 (Week 1): Pt will tolerate >30 min of OOB activity to participate in therapy   Skilled Therapeutic Interventions/Progress Updates:   Session 1:  Pt in supine and agreeable to therapy, pain as detailed below and reports feeling well this morning. Supine>sit w/ supervision w/ increased time, pt moving very slow this morning 2/2 "stiffness" globally. Pt preferring to perform task as much by himself as possible as his back hurts when someone tries to assist him physically. Static and dyanmic sitting for 15 minutes w/ supervision while donning/doffing new shirt and performing upper body bathing prior to RN hooking pt up to central line medications. Increased time for these tasks as well 2/2 fatigue/weakness and heavy reliance on UE support in sitting 2/2 fear of falling, however pt has adequate sitting balance w/o UE support. Ongoing encouragement to decreased reliance on UE support. Stand pivot transfer to w/c w/ RW and min assist. Pt self-propelled w/c in hallway 100' w/ supervision using BUEs, slow speed but functional. Returned to room and ended session in recliner, all needs in reach.   Session 2:  Pt received in care of NT, sitting at EOB. Therapist took over and provided supervision while pt finished eating lunch, he reports feeling increased dizziness, nausea, and fatigue this afternoon, he suspects 2/2 fatigue and medications. Worked on upright/OOB tolerance this session. Pt needed  to lean onto 1 elbow in semi-reclined position in order to utilize 1 UE to eat lunch. Suggested getting up to chair for back support, however pt declined. Dizziness continued while sitting up at EOB, BP WNL at 119/81. Oncologist arrived for visit w/ pt. Ended session sitting EOB, all needs in reach. Missed 15 min of skilled PT 2/2 being unavailable.   Therapy Documentation Precautions:  Precautions Precautions: Fall, Back Precaution Comments: back precautions/logroll Restrictions Weight Bearing Restrictions: No Other Position/Activity Restrictions: T3, T6 - T 10 path Fx Pain: Pain Assessment Pain Scale: 0-10 Pain Score: 6  Pain Type: Acute pain Pain Location: Back Pain Orientation: Mid Pain Descriptors / Indicators: Aching Pain Frequency: Intermittent Pain Onset: Gradual Patients Stated Pain Goal: 4 Pain Intervention(s): Medication (See eMAR)(acetaminophen)  Therapy/Group: Individual Therapy  Edward Todd 09/11/2018, 9:02 AM

## 2018-09-11 NOTE — Care Management (Signed)
Inpatient [facility] Individual Statement of Services  Patient Name:  Edward Todd  Date:  09/11/2018  Welcome to the [facility].  Our goal is to provide you with an individualized program based on your diagnosis and situation, designed to meet your specific needs.  With this comprehensive rehabilitation program, you will be expected to participate in at least 3 hours of rehabilitation therapies Monday-Friday, with modified therapy programming on the weekends.  Your rehabilitation program will include the following services:  Physical Therapy (PT), Occupational Therapy (OT), Speech Therapy (ST), 24 hour per day rehabilitation nursing, Therapeutic Recreaction (TR), Neuropsychology, Case Management (Social Worker), Rehabilitation Medicine, Nutrition Services and Pharmacy Services  Weekly team conferences will be held on Wednesdays to discuss your progress.  Your Social Worker will talk with you frequently to get your input and to update you on team discussions.  Team conferences with you and your family in attendance may also be held.  Expected length of stay: 2 weeks   Overall anticipated outcome: minimal assistance  Depending on your progress and recovery, your program may change. Your Social Worker will coordinate services and will keep you informed of any changes. Your Social Worker's name and contact numbers are listed  below.  The following services may also be recommended but are not provided by the [facility] will be made to provide these services after discharge if needed.  Arrangements include referral to agencies that provide these services.  Your insurance has been verified to be:  [employer] Your primary doctor is:  TBD  Pertinent information will be shared with your doctor and your  insurance company.  Social Worker:  [place], [place] or ([phone]   Information discussed with and copy given to patient by: Lennart Pall, 09/11/2018, 4:22 PM

## 2018-09-11 NOTE — Progress Notes (Signed)
Occupational Therapy Session Note  Patient Details  Name: Edward Todd MRN: [id number] Date of Birth: [birth year]  Today's Date: 09/11/2018 OT Individual Time: 1000-1100 OT Individual Time Calculation (min): 60 min    Short Term Goals: Week 1:  OT Short Term Goal 1 (Week 1): Patient will complete bed mobility and functional transfers with mod A OT Short Term Goal 2 (Week 1): patient will tolerate OOB for 1 hour at a time and full therapy schedule OT Short Term Goal 3 (Week 1): assess visual complaints and follow up as appropriate OT Short Term Goal 4 (Week 1): Patient will complete UB bathing and dressing with min A, LB bathing and dressing with mod A  Skilled Therapeutic Interventions/Progress Updates:    Patient seated in recliner and ready for therapy.  He notes improved tolerance today but is having stomach cramping and pain.  Completed SPT recliner to w/c with RW min/mod A.  SPT to/from toilet using grab bars with min/mod A.  Assist for hygiene and pants up.  Patient returned quickly to the toilet after first trip due to increased stomach pain and urge to have another BM - he also became nauseous - nursing aware.  He sat on the toilet for a majority of one hour session.  SPT back to bed at close of session with mod A due to fatigue.  Mod A to position in bed.  Bed alarm set and call bell/tray table in reach.    Therapy Documentation Precautions:  Precautions Precautions: Fall, Back Precaution Comments: back precautions/logroll Restrictions Weight Bearing Restrictions: No Other Position/Activity Restrictions: T3, T6 - T 10 path Fx General:   Vital Signs:  Pain: Pain Assessment Pain Scale: 0-10 Pain Score: 5  Pain Location: Abdomen Pain Orientation: Mid Pain Descriptors / Indicators: Aching Pain Intervention(s): Repositioned;RN made aware   Other Treatments:     Therapy/Group: Individual Therapy  Carlos Levering 09/11/2018, 12:12 PM

## 2018-09-11 NOTE — Telephone Encounter (Signed)
Received call from pt asking about papers for STD that were faxed from [facility].  Checked with Thompson Caul LPN & she received signed papers from Dr Burr Medico today & will fax to [facility] today.  Notified pt via his vm of activity & Cecille Rubin will mail him a copy of paperwork.

## 2018-09-11 NOTE — Progress Notes (Signed)
PHYSICAL MEDICINE & REHABILITATION PROGRESS NOTE  Subjective/Complaints: Patient seen sitting up in bed this morning eating breakfast.  He states he slept well overnight.  He states he is feeling better.  He was seen by hematology/oncology yesterday, notes reviewed.  ROS: Denies CP, shortness of breath, nausea, vomiting, diarrhea.  Objective: Vital Signs: Blood pressure (!) 115/93, pulse 81, temperature 98.3 F (36.8 C), temperature source Oral, resp. rate 19, height 6' 3.51" (1.918 m), weight 130.7 kg, SpO2 97 %. No results found. Recent Labs    09/09/18 1821 09/10/18 0703  WBC 3.3* 4.8  HGB 7.6* 7.7*  HCT 22.7* 22.3*  PLT 171 195   Recent Labs    09/09/18 1821 09/10/18 0703  NA 130* 129*  K 3.5 3.7  CL 102 103  CO2 19* 20*  GLUCOSE 104* 94  BUN 14 13  CREATININE 1.17 1.09  CALCIUM 7.3* 7.4*    Physical Exam: BP (!) 115/93 (BP Location: Left Arm)   Pulse 81   Temp 98.3 F (36.8 C) (Oral)   Resp 19   Ht 6' 3.51" (1.918 m)   Wt 130.7 kg   SpO2 97%   BMI 35.53 kg/m  Constitutional: No distress . Vital signs reviewed. HENT: Normocephalic.  Atraumatic. Eyes: EOMI.  No discharge. Cardiovascular: No JVD. Respiratory: Normal effort. GI: Non-distended. Musc: No edema or tenderness in extremities Neurological: Alert and oriented Good awareness of deficits.  Motor: 4-/5 throughout (pain inhibition), improving Skin: Skin iswarmand dry.  Psychiatric: He has anormal mood and affect. Hisbehavior is normal.Judgmentand thought contentnormal.   Assessment/Plan: 1. Functional deficits secondary to multiple myeloma which require 3+ hours per day of interdisciplinary therapy in a comprehensive inpatient rehab setting.  Physiatrist is providing close team supervision and 24 hour management of active medical problems listed below.  Physiatrist and rehab team continue to assess barriers to discharge/monitor patient progress toward functional and medical  goals  Care Tool:  Bathing  Bathing activity did not occur: Safety/medical concerns           Bathing assist       Upper Body Dressing/Undressing Upper body dressing Upper body dressing/undressing activity did not occur (including orthotics): Safety/medical concerns      Upper body assist      Lower Body Dressing/Undressing Lower body dressing    Lower body dressing activity did not occur: Safety/medical concerns       Lower body assist       Toileting Toileting    Toileting assist Assist for toileting: Set up assist Assistive Device Comment: of urinal per RN report    Transfers Chair/bed transfer  Transfers assist  Chair/bed transfer activity did not occur: Safety/medical concerns  Chair/bed transfer assist level: Minimal Assistance - Patient > 75%     Locomotion Ambulation   Ambulation assist   Ambulation activity did not occur: Safety/medical concerns  Assist level: Minimal Assistance - Patient > 75% Assistive device: Walker-rolling Max distance: 5'   Walk 10 feet activity   Assist  Walk 10 feet activity did not occur: Safety/medical concerns        Walk 50 feet activity   Assist Walk 50 feet with 2 turns activity did not occur: Safety/medical concerns         Walk 150 feet activity   Assist Walk 150 feet activity did not occur: Safety/medical concerns         Walk 10 feet on uneven surface  activity   Assist Walk 10 feet on  uneven surfaces activity did not occur: Safety/medical concerns         Wheelchair     Assist Will patient use wheelchair at discharge?: Yes Type of Wheelchair: Manual Wheelchair activity did not occur: Safety/medical concerns(low hemoglobin/fatigue and pain/back spasms)         Wheelchair 50 feet with 2 turns activity    Assist    Wheelchair 50 feet with 2 turns activity did not occur: Safety/medical concerns       Wheelchair 150 feet activity     Assist Wheelchair 150  feet activity did not occur: Safety/medical concerns          Medical Problem List and Plan: 1.Decreased functional mobilitysecondary to newly diagnosed multiple myeloma.  Continue CIR  Weekly Velcade and dexamethasone initiated 09/05/2018 per Heme/Onc 2. Antithrombotics: -DVT/anticoagulation:Subcutaneous Lovenox -antiplatelet therapy: Aspirin 81 mg daily 3. Pain Management:Oxycodone as needed -scheduled flexeril TID -kpad prn for spasms 4. Mood:Provide emotional support -antipsychotic agents: N/A 5. Neuropsych: This patientiscapable of making decisions on hisown behalf. 6. Skin/Wound Care:Routine skin checks 7. Fluids/Electrolytes/Nutrition:Routine in and outs   Remeron started 5/18-appetite overall improving on 5/19 8.Acute on chronic anemia. Follow-up hematology oncology services  Hemoglobin 7.7 on 5/18 transfusion on 5/17  Hemoccult ordered  Recs per Heme/Onc 9. Diabetes mellitus. Hemoglobin A1c 9.4. Provide education  Metformin 500 twice daily  Relatively controlled on 5/19 10. Morbid obesity. BMI 35.53. Dietary follow-up 11. Constipation. Laxative assistance 12.  Hyponatremia  Sodium 129 on 5/18, labs ordered for tomorrow  Continue to monitor 13.  Fevers  Started on IV Levaquin on 5/16 and?  Acyclovir  Improving 14.  Sleep disturbance  Likely exacerbated by steroids  No benefit with Ambien  Remeron started on 5/18  Improving on 5/19  LOS: 4 days A FACE TO FACE EVALUATION WAS PERFORMED  Dahna Hattabaugh [name] 09/11/2018, 9:46 AM

## 2018-09-11 NOTE — Progress Notes (Signed)
Edward Todd   DOB:05-21-1975   SW#:[id number]   DUK#:[id number]  Hem/Onc follow up   Subjective: Tolerating physical therapy moderately well.  Reported some dizziness this morning with ambulation.  Also reported some intermittent nausea.  No vomiting.  Pain is moderately controlled.  Remains afebrile.  On oral Levaquin.  Objective:  Vitals:   09/10/18 2021 09/11/18 0402  BP: 123/82 (!) 115/93  Pulse: 87 81  Resp: 18 19  Temp: 98.4 F (36.9 C) 98.3 F (36.8 C)  SpO2: 100% 97%    Body mass index is 35.53 kg/m.  Intake/Output Summary (Last 24 hours) at 09/11/2018 1301 Last data filed at 09/11/2018 0900 Gross per 24 hour  Intake 280 ml  Output 650 ml  Net -370 ml     Sclerae unicteric  Oropharynx clear  No peripheral adenopathy  Lungs clear -- no rales or rhonchi  Heart regular rate and rhythm  Abdomen benign    CBG (last 3)  Recent Labs    09/10/18 2117 09/11/18 0630 09/11/18 1143  GLUCAP 102* 107* 121*    Urine Studies No results for input(s): UHGB, CRYS in the last 72 hours.  Invalid input(s): UACOL, UAPR, USPG, UPH, UTP, UGL, UKET, UBIL, UNIT, UROB, [place], UEPI, UWBC, Junie Panning [place], [place], [name]  Basic Metabolic Panel: Recent Labs  Lab 09/05/18 0727 09/06/18 3762 09/07/18 0654 09/08/18 0726 09/09/18 0735 09/09/18 1821 09/10/18 0703  NA 132* 129* 131* 127* 130* 130* 129*  K 3.8 3.9 3.7 3.2* 3.3* 3.5 3.7  CL 96* 99 102 95* 99 102 103  CO2 24 19* 21* 20* 20* 19* 20*  GLUCOSE 124* 186* 174* 113* 108* 104* 94  BUN 25* 30* 37* _0 CREATININE 1.42* 1.44* 1.34* 1.28* 1.34* 1.17 1.09  CALCIUM 10.2 9.5 8.5* 7.9* 7.4* 7.3* 7.4*  MG 2.0 1.9  --   --   --   --   --   PHOS 3.7 3.7  --   --   --   --   --    GFR Estimated Creatinine Clearance: 126.9 mL/min (by C-G formula based on SCr of 1.09 mg/dL). Liver Function Tests: Recent Labs  Lab 09/05/18 0727 09/06/18 0614 09/08/18 0726 09/09/18 0735 09/09/18 1821  AST _1 ALT _2 ALKPHOS 56 51 48 45 45  BILITOT 0.4 0.6 0.6 0.5 0.6  PROT >12.0* >12.0* 11.2* 10.9* 11.3*  ALBUMIN 2.8* 2.8* 2.3* 2.0* 2.1*   No results for input(s): LIPASE, AMYLASE in the last 168 hours. No results for input(s): AMMONIA in the last 168 hours. Coagulation profile No results for input(s): INR, PROTIME in the last 168 hours.  CBC: Recent Labs  Lab 09/06/18 0614 09/07/18 0654 09/08/18 0726 09/08/18 1525 09/09/18 0735 09/09/18 1821 09/10/18 0703  WBC 6.0 3.9* 2.7* 2.8* 3.1* 3.3* 4.8  NEUTROABS 3.7 2.9  --  1.9  --  1.5* 2.6  HGB 7.9* 7.2* 6.8* 6.9* 6.3* 7.6* 7.7*  HCT 23.5* 22.5* 20.1* 20.3* 18.6* 22.7* 22.3*  MCV 90.0 93.4 87.8 87.1 89.0 89.0 88.1  PLT 210 191 176 169 165 171 195   Cardiac Enzymes: No results for input(s): CKTOTAL, CKMB, CKMBINDEX, TROPONINI in the last 168 hours. BNP: Invalid input(s): POCBNP CBG: Recent Labs  Lab 09/10/18 1159 09/10/18 1732 09/10/18 2117 09/11/18 0630 09/11/18 1143  GLUCAP 102* 121* 102* 107* 121*   D-Dimer No results for input(s): DDIMER in the last 72 hours. Hgb A1c  No results for input(s): HGBA1C in the last 72 hours. Lipid Profile No results for input(s): CHOL, HDL, LDLCALC, TRIG, CHOLHDL, LDLDIRECT in the last 72 hours. Thyroid function studies No results for input(s): TSH, T4TOTAL, T3FREE, THYROIDAB in the last 72 hours.  Invalid input(s): FREET3 Anemia work up No results for input(s): VITAMINB12, FOLATE, FERRITIN, TIBC, IRON, RETICCTPCT in the last 72 hours. Microbiology Recent Results (from the past 240 hour(s))  Culture, blood (single) w Reflex to ID Panel     Status: None (Preliminary result)   Collection Time: 09/08/18  8:45 AM  Result Value Ref Range Status   Specimen Description BLOOD RIGHT ANTECUBITAL  Final   Special Requests   Final    BOTTLES DRAWN AEROBIC AND ANAEROBIC Blood Culture adequate volume   Culture   Final    NO GROWTH 3 DAYS Performed at [facility], [address],  [place], [place] [address]    Report Status PENDING  Incomplete  Culture, Urine     Status: Abnormal   Collection Time: 09/08/18  1:42 PM  Result Value Ref Range Status   Specimen Description URINE, CLEAN CATCH  Final   Special Requests NONE  Final   Culture (A)  Final    <10,000 COLONIES/mL INSIGNIFICANT GROWTH Performed at [facility], [address] [place],  [address]    Report Status 09/09/2018 FINAL  Final      Studies:  No results found.  Assessment: 43 y.o.  African-American male, without past medical history, presented with debilitating low back pain,and moderate anemia.  1.Multiple myeloma, IgG Kappa, stage III 2.Moderate anemia, worsening after chemo, 1u PRBC on 5/17 3. Type 2 diabetes  4. Severe low back pain, from thoracic and lumbar compression fracture, lumbar spine stenosis  5. Neutropenia and fever, resolved now  6. Hyponatremia   Plan:  -The patient is due for his weekly Velcade injection of dexamethasone on 09/12/2018.  Tolerated the first dose well overall with the exception of worsening anemia.  We will plan to administer chemotherapy as scheduled tomorrow.  Anticipate that Revlimid will be available on 09/12/2018.  Medication will be delivered to the cancer center and brought to [facility] for administration. -Had worsening anemia likely due to Velcade injection last week and received 1 unit of blood. Stool for occult blood was negative.  We will recheck a CBC in the morning prior to chemotherapy. -His infection work-up was negative, will defer to primary team for the duration of antibiotics -I encouraged him to continue rehab -His BG has been controlled, continue metformin  -He has ondansetron available to him as needed for nausea -will f/u    Mikey Bussing, NP 09/11/2018  1:01 PM

## 2018-09-11 NOTE — Plan of Care (Signed)
LTGs upgraded to supervision-CGA overall due to pt progress and increased tolerance to therapies since evaluation on 09/09/18.   Burnard Bunting, PT, DPT  Problem: Sit to Stand Goal: LTG:  Patient will perform sit to stand with assistance level (PT) Description LTG:  Patient will perform sit to stand with assistance level (PT) Flowsheets (Taken 09/11/2018 0811) LTG: PT will perform sit to stand in preparation for functional mobility with assistance level: Supervision/Verbal cueing   Problem: RH Bed Mobility Goal: LTG Patient will perform bed mobility with assist (PT) Description LTG: Patient will perform bed mobility with assistance, with/without cues (PT). Flowsheets (Taken 09/11/2018 0811) LTG: Pt will perform bed mobility with assistance level of: Supervision/Verbal cueing   Problem: RH Bed to Chair Transfers Goal: LTG Patient will perform bed/chair transfers w/assist (PT) Description LTG: Patient will perform bed to chair transfers with assistance (PT). Flowsheets (Taken 09/11/2018 0811) LTG: Pt will perform Bed to Chair Transfers with assistance level  : Supervision/Verbal cueing   Problem: RH Car Transfers Goal: LTG Patient will perform car transfers with assist (PT) Description LTG: Patient will perform car transfers with assistance (PT). Flowsheets (Taken 09/11/2018 0811) LTG: Pt will perform car transfers with assist:: Contact Guard/Touching assist   Problem: RH Ambulation Goal: LTG Patient will ambulate in controlled environment (PT) Description LTG: Patient will ambulate in a controlled environment, # of feet with assistance (PT). Flowsheets (Taken 09/11/2018 0811) LTG: Pt will ambulate in controlled environ  assist needed:: Supervision/Verbal cueing LTG: Ambulation distance in controlled environment: 25' w/ LRAD   Problem: RH Stairs Goal: LTG Patient will ambulate up and down stairs w/assist (PT) Description LTG: Patient will ambulate up and down # of stairs with assistance  (PT) Flowsheets (Taken 09/11/2018 0811) LTG: Pt will ambulate up/down stairs assist needed:: Contact Guard/Touching assist LTG: Pt will  ambulate up and down number of stairs: 1 step w/ LRAD as per home set-up

## 2018-09-12 ENCOUNTER — Encounter (HOSPITAL_COMMUNITY): Payer: Self-pay | Admitting: Hematology

## 2018-09-12 ENCOUNTER — Inpatient Hospital Stay (HOSPITAL_COMMUNITY): Payer: 59

## 2018-09-12 ENCOUNTER — Inpatient Hospital Stay (HOSPITAL_COMMUNITY): Payer: 59 | Admitting: Physical Therapy

## 2018-09-12 LAB — BASIC METABOLIC PANEL
Anion gap: 7 (ref 5–15)
BUN: 12 mg/dL (ref 6–20)
CO2: 20 mmol/L — ABNORMAL LOW (ref 22–32)
Calcium: 7.5 mg/dL — ABNORMAL LOW (ref 8.9–10.3)
Chloride: 107 mmol/L (ref 98–111)
Creatinine, Ser: 1.02 mg/dL (ref 0.61–1.24)
GFR calc Af Amer: >60 mL/min (ref >60)
GFR calc non Af Amer: >60 mL/min (ref >60)
Glucose, Bld: 106 mg/dL — ABNORMAL HIGH (ref 70–99)
Potassium: 3.5 mmol/L (ref 3.5–5.1)
Sodium: 134 mmol/L — ABNORMAL LOW (ref 135–145)

## 2018-09-12 LAB — GLUCOSE, CAPILLARY
Glucose-Capillary: 106 mg/dL — ABNORMAL HIGH (ref 70–99)
Glucose-Capillary: 150 mg/dL — ABNORMAL HIGH (ref 70–99)
Glucose-Capillary: 184 mg/dL — ABNORMAL HIGH (ref 70–99)
Glucose-Capillary: 177 mg/dL — ABNORMAL HIGH (ref 70–99)

## 2018-09-12 LAB — CBC WITH DIFFERENTIAL/PLATELET
Abs Immature Granulocytes: 0.01 10*3/uL (ref 0.00–0.07)
Basophils Absolute: 0 10*3/uL (ref 0.0–0.1)
Basophils Relative: 0 %
Eosinophils Absolute: 0 10*3/uL (ref 0.0–0.5)
Eosinophils Relative: 0 %
HCT: 23.2 % — ABNORMAL LOW (ref 39.0–52.0)
Hemoglobin: 7.8 g/dL — ABNORMAL LOW (ref 13.0–17.0)
Immature Granulocytes: 0 %
Lymphocytes Relative: 52 %
Lymphs Abs: 2.1 10*3/uL (ref 0.7–4.0)
MCH: 30.1 pg (ref 26.0–34.0)
MCHC: 33.6 g/dL (ref 30.0–36.0)
MCV: 89.6 fL (ref 80.0–100.0)
Monocytes Absolute: 0.3 10*3/uL (ref 0.1–1.0)
Monocytes Relative: 7 %
Neutro Abs: 1.7 10*3/uL (ref 1.7–7.7)
Neutrophils Relative %: 41 %
Platelets: 222 10*3/uL (ref 150–400)
RBC: 2.59 MIL/uL — ABNORMAL LOW (ref 4.22–5.81)
RDW: 14.1 % (ref 11.5–15.5)
WBC: 4.1 10*3/uL (ref 4.0–10.5)
nRBC: 0 % (ref 0.0–0.2)

## 2018-09-12 MED ORDER — DEXAMETHASONE 6 MG PO TABS
40.0000 mg | ORAL_TABLET | Freq: Once | ORAL | Status: AC
  Administered 2018-09-12: 40 mg via ORAL
  Filled 2018-09-12: qty 1

## 2018-09-12 MED ORDER — PRO-STAT SUGAR FREE PO LIQD
30.0000 mL | Freq: Two times a day (BID) | ORAL | Status: DC
  Administered 2018-09-12 – 2018-09-25 (×25): 30 mL via ORAL
  Filled 2018-09-12 (×26): qty 30

## 2018-09-12 MED ORDER — PROCHLORPERAZINE MALEATE 5 MG PO TABS
10.0000 mg | ORAL_TABLET | Freq: Once | ORAL | Status: AC
  Administered 2018-09-12: 10 mg via ORAL
  Filled 2018-09-12: qty 2

## 2018-09-12 MED ORDER — BORTEZOMIB CHEMO SQ INJECTION 3.5 MG (2.5MG/ML)
1.3000 mg/m2 | Freq: Once | INTRAMUSCULAR | Status: AC
  Administered 2018-09-12: 3.5 mg via SUBCUTANEOUS
  Filled 2018-09-12: qty 1.4

## 2018-09-12 NOTE — Progress Notes (Signed)
Physical Therapy Session Note  Patient Details  Name: Edward Todd MRN: [id number] Date of Birth: [birth year]  Today's Date: 09/12/2018 PT Individual Time: 0850-0950 PT Individual Time Calculation (min): 60 min   Short Term Goals: Week 1:  PT Short Term Goal 1 (Week 1): Pt will perform bed mobility with min assist PT Short Term Goal 2 (Week 1): Pt will perform sit<>stand with mod assist PT Short Term Goal 3 (Week 1): Pt will initiate transfer training from bed<>w/c PT Short Term Goal 4 (Week 1): Pt will tolerate >30 min of OOB activity to participate in therapy   Skilled Therapeutic Interventions/Progress Updates:   Pt in supine and agreeable to therapy, pain 4/10 in lower back. Requesting to sit up and eat breakfast. Supine>sit w/ increased time and supervision. Maintained static sitting balance for 15 min while eating breakfast, emphasis on utilizing only 1 UE for support in unsupported sitting. Verbal cues to activate trunk musculature for postural control and encouragement to not rely so heavily on UE support. Needed elbow support on table in order to feed himself. Stand pivot transfer to w/c w/ CGA using RW. Total assist w/c transport to/from therapy gym. Performed kinetron 15 min @ 50 cm/sec w/ BLEs to work on global endurance and strengthening. Pt moved LEs very slowly, but kept up a steady rate and reciprocal movement pattern. Pt moved very slow and cautiously w/ all movements this session 2/2 fear of falling and pain. Pt was able to reach back to his armrest to control descent to chair 1 time this session, which is a big improvement. Returned to room, stand pivot transfer w/ CGA back to EOB. Ended session in supine, all needs in reach.   Therapy Documentation Precautions:  Precautions Precautions: Fall, Back Precaution Comments: back precautions/logroll Restrictions Weight Bearing Restrictions: No Other Position/Activity Restrictions: T3, T6 - T 10 path Fx Pain: Pain  Assessment Pain Score: Asleep  Therapy/Group: Individual Therapy  Chrisa Hassan [name] 09/12/2018, 9:52 AM

## 2018-09-12 NOTE — Progress Notes (Signed)
Occupational Therapy Session Note  Patient Details  Name: Edward Todd MRN: [id number] Date of Birth: 03-May-1975  Today's Date: 09/12/2018 OT Individual Time: 1345-1430 OT Individual Time Calculation (min): 45 min    Short Term Goals: Week 1:  OT Short Term Goal 1 (Week 1): Patient will complete bed mobility and functional transfers with mod A OT Short Term Goal 2 (Week 1): patient will tolerate OOB for 1 hour at a time and full therapy schedule OT Short Term Goal 3 (Week 1): assess visual complaints and follow up as appropriate OT Short Term Goal 4 (Week 1): Patient will complete UB bathing and dressing with min A, LB bathing and dressing with mod A  Skilled Therapeutic Interventions/Progress Updates:    1:1. Pt received in bed eating lunch asking for more time to finish meal. Pt declines eating EOB and misses 15 min skilled OT. Upon coming back into room, pt agreeable to tx. Pt Sitting EOB with MIN A. Pt requires min A overall with RW to stand pivot transfer EOB>w/c>recliner with VC for hand placement on RW. Pt completes w/c mobility part way to/from tx spaces. Pt completes 2 rounds standing bean bag toss for unilateral support on RW and overcoming anxiety with 1UE support. Pt very proud to complete task. Exited session with pt seated in w/c, call light in reach and all needs met.  Therapy Documentation Precautions:  Precautions Precautions: Fall, Back Precaution Comments: back precautions/logroll Restrictions Weight Bearing Restrictions: No Other Position/Activity Restrictions: T3, T6 - T 10 path Fx General: General OT Amount of Missed Time: 35 Minutes Vital Signs:   Pain:   ADL: ADL Eating: Other (comment)(patient needs assist at this time for cup to mouth due to pain and fatigue) Where Assessed-Eating: Bed level Grooming: Not assessed(unable to tolerate) Upper Body Bathing: Not assessed(unable to tolerate) Lower Body Bathing: Not assessed(unable to tolerate) Upper  Body Dressing: Not assessed(unable to tolerate) Lower Body Dressing: Not assessed(unable to tolerate) Toileting: Not assessed(unable to tolerate) Toilet Transfer: Not assessed(unable to tolerate) Social research officer, government: Not assessed(unable to tolerate) ADL Comments: patient able to wash his face with min A Vision   Perception    Praxis   Exercises:   Other Treatments:     Therapy/Group: Individual Therapy  Tonny Branch 09/12/2018, 1:36 PM

## 2018-09-12 NOTE — Progress Notes (Signed)
Spoke w/ Dr. Burr Medico, patient is okay to treat today with Velcade and dexamethasone with hemoglobin = 7.8. Revlimid should be arriving to the hospital sometime today.   Demetrius Charity, PharmD, [facility] Pharmacist Pharmacy Phone: [phone] 09/12/2018

## 2018-09-12 NOTE — Progress Notes (Signed)
Nutrition Follow-up  RD working remotely.  DOCUMENTATION CODES:   Obesity unspecified  INTERVENTION:   - Please obtain new weight  - Continue Ensure Max po BID, each supplement provides 150 kcal and 30 grams of protein  - Add Pro-stat 30 ml BID, each supplement provides 100 kcal and 15 grams of protein  - Encourage adequate PO intake  NUTRITION DIAGNOSIS:   Increased nutrient needs related to cancer and cancer related treatments (new dx of MM s/p first tx) as evidenced by estimated needs.  Ongoing, being addressed via oral nutrition supplements  GOAL:   Patient will meet greater than or equal to 90% of their needs  Progressing  MONITOR:   PO intake, Labs, I & O's, Supplement acceptance, Diet advancement, Weight trends  REASON FOR ASSESSMENT:   Consult Poor PO  ASSESSMENT:   43 y/o male PMHx obesity, recent dx of DM2 as well as recent dx of Multiple Myeloma. Had been hospitalized at [facility] 5/7-5/15 after he presented with intractable back pain. Found to have acute anemia and MRI concerning for abnormal process of marrow s/p biopsy 5/12. Dx w/ MM and received first tx 5/13. Tx to CIR for rehab.   Pt is s/p second week of Velcade injection of dexamethasone today.  Unable to reach pt by phone despite multiple attempts. Phone message saying "all circuits are busy." Per MAR, pt accepting most Ensure Max oral nutrition supplements.  Please obtain new weight.  Meal Completion: 10-80% x last 6 recorded meals (averaging 54%)  Medications reviewed and include: SSI, Metformin, Remeron 15 mg daily, Protonix, Miralax, K-dur 20 mEq daily, Ensure Max BID, Senna  Labs reviewed: sodium 134 (L), hemoglobin 7.8 (L) CBG's: 106, 99, 98 x 24 hours  UOP: 900 ml x 24 hours  Diet Order:   Diet Order            Diet Carb Modified Fluid consistency: Thin; Room service appropriate? Yes  Diet effective now              EDUCATION NEEDS:   Not appropriate for education at this  time  Skin:  Skin Assessment: Reviewed RN Assessment  Last BM:  09/11/18 large type 4  Height:   Ht Readings from Last 1 Encounters:  09/07/18 6' 3.51" (1.918 m)    Weight:   Wt Readings from Last 1 Encounters:  09/07/18 130.7 kg    Ideal Body Weight:  90.45 kg  BMI:  Body mass index is 35.53 kg/m.  Estimated Nutritional Needs:   Kcal:  2100-2350 (16-18 kcal/kg bw)  Protein:  105-120g Pro (~20% of energy)  Fluid:  >2.1 L fluid (1ml/ kcal)    Kate Jablonski Holmes, MS, RD, LDN Inpatient Clinical Dietitian Pager: [phone] Weekend/After Hours: [phone]  

## 2018-09-12 NOTE — Progress Notes (Addendum)
Edward Todd   DOB:[birth year]   OZ#:[id number]   QQV#:[id number]  Hem/Onc follow up   Subjective: The patient is feeling well today.  He received a second dose of Velcade this morning and has so far tolerated it well.  He reports some mild intermittent nausea but no vomiting.  Bowels are moving without diarrhea.  Back pain is currently controlled.  Cough is better.  He is actively working with therapy.  The patient has no other complaints today.  Objective:  Vitals:   09/11/18 1934 09/12/18 0325  BP: 122/83 125/84  Pulse: 85 79  Resp: 19 18  Temp: 99.1 F (37.3 C) 98.8 F (37.1 C)  SpO2: 100% 100%    Body mass index is 35.53 kg/m.  Intake/Output Summary (Last 24 hours) at 09/12/2018 1454 Last data filed at 09/12/2018 0930 Gross per 24 hour  Intake 480 ml  Output 1375 ml  Net -895 ml     Sclerae unicteric  Oropharynx clear  No peripheral adenopathy  Lungs clear -- no rales or rhonchi  Heart regular rate and rhythm  Abdomen benign    CBG (last 3)  Recent Labs    09/11/18 1636 09/11/18 2105 09/12/18 0553  GLUCAP 98 99 106*    Urine Studies No results for input(s): UHGB, CRYS in the last 72 hours.  Invalid input(s): UACOL, UAPR, USPG, UPH, UTP, UGL, UKET, UBIL, UNIT, UROB, Roy, UEPI, UWBC, Junie Panning [place], [place], [name]  Basic Metabolic Panel: Recent Labs  Lab 09/06/18 [phone]  09/08/18 0726 09/09/18 0735 09/09/18 1821 09/10/18 0703 09/12/18 0419  NA 129*   < > 127* 130* 130* 129* 134*  K 3.9   < > 3.2* 3.3* 3.5 3.7 3.5  CL 99   < > 95* 99 102 103 107  CO2 19*   < > 20* 20* 19* 20* 20*  GLUCOSE 186*   < > 113* 108* 104* 94 106*  BUN 30*   < > _0 CREATININE 1.44*   < > 1.28* 1.34* 1.17 1.09 1.02  CALCIUM 9.5   < > 7.9* 7.4* 7.3* 7.4* 7.5*  MG 1.9  --   --   --   --   --   --   PHOS 3.7  --   --   --   --   --   --    < > = values in this interval not displayed.   GFR Estimated Creatinine Clearance: 135.6 mL/min (by C-G formula based on SCr  of 1.02 mg/dL). Liver Function Tests: Recent Labs  Lab 09/06/18 0614 09/08/18 0726 09/09/18 0735 09/09/18 1821  AST _1 ALT _2 ALKPHOS 51 48 45 45  BILITOT 0.6 0.6 0.5 0.6  PROT >12.0* 11.2* 10.9* 11.3*  ALBUMIN 2.8* 2.3* 2.0* 2.1*   No results for input(s): LIPASE, AMYLASE in the last 168 hours. No results for input(s): AMMONIA in the last 168 hours. Coagulation profile No results for input(s): INR, PROTIME in the last 168 hours.  CBC: Recent Labs  Lab 09/07/18 0654  09/08/18 1525 09/09/18 0735 09/09/18 1821 09/10/18 0703 09/12/18 0419  WBC 3.9*   < > 2.8* 3.1* 3.3* 4.8 4.1  NEUTROABS 2.9  --  1.9  --  1.5* 2.6 1.7  HGB 7.2*   < > 6.9* 6.3* 7.6* 7.7* 7.8*  HCT 22.5*   < > 20.3* 18.6* 22.7* 22.3* 23.2*  MCV 93.4   < > 87.1 89.0  89.0 88.1 89.6  PLT 191   < > 169 165 171 195 222   < > = values in this interval not displayed.   Cardiac Enzymes: No results for input(s): CKTOTAL, CKMB, CKMBINDEX, TROPONINI in the last 168 hours. BNP: Invalid input(s): POCBNP CBG: Recent Labs  Lab 09/11/18 0630 09/11/18 1143 09/11/18 1636 09/11/18 2105 09/12/18 0553  GLUCAP 107* 121* 98 99 106*   D-Dimer No results for input(s): DDIMER in the last 72 hours. Hgb A1c No results for input(s): HGBA1C in the last 72 hours. Lipid Profile No results for input(s): CHOL, HDL, LDLCALC, TRIG, CHOLHDL, LDLDIRECT in the last 72 hours. Thyroid function studies No results for input(s): TSH, T4TOTAL, T3FREE, THYROIDAB in the last 72 hours.  Invalid input(s): FREET3 Anemia work up No results for input(s): VITAMINB12, FOLATE, FERRITIN, TIBC, IRON, RETICCTPCT in the last 72 hours. Microbiology Recent Results (from the past 240 hour(s))  Culture, blood (single) w Reflex to ID Panel     Status: None (Preliminary result)   Collection Time: 09/08/18  8:45 AM  Result Value Ref Range Status   Specimen Description BLOOD RIGHT ANTECUBITAL  Final   Special Requests   Final     BOTTLES DRAWN AEROBIC AND ANAEROBIC Blood Culture adequate volume   Culture   Final    NO GROWTH 3 DAYS Performed at [facility], [address], [place], [place] [address]    Report Status PENDING  Incomplete  Culture, Urine     Status: Abnormal   Collection Time: 09/08/18  1:42 PM  Result Value Ref Range Status   Specimen Description URINE, CLEAN CATCH  Final   Special Requests NONE  Final   Culture (A)  Final    <10,000 COLONIES/mL INSIGNIFICANT GROWTH Performed at [facility], [address], [place], [place] [address]    Report Status 09/09/2018 FINAL  Final      Studies:  No results found.  Assessment: 43 y.o.  African-American male, without past medical history, presented with debilitating low back pain,and moderate anemia.  1.Multiple myeloma, IgG Kappa, stage III 2.Moderate anemia, worsening after chemo, 1u PRBC on 5/17 3. Type 2 diabetes  4. Severe low back pain, from thoracic and lumbar compression fracture, lumbar spine stenosis  5. Neutropenia and fever, resolved now  6. Hyponatremia   Plan:  -The patient received his second dose of Velcade dexamethasone earlier today.  Tolerated this well.  Anticipate that Revlimid will be available on 09/12/2018.  Medication will be delivered to the cancer center and brought to [facility] for administration later today. -Had worsening anemia likely due to Velcade injection last week and received 1 unit of blood. Stool for occult blood was negative.  Hemoglobin remained stable.  No need for transfusion. -His infection work-up was negative, will defer to primary team for the duration of antibiotics -I encouraged him to continue rehab -His BG has been controlled, continue metformin  -He has ondansetron available to him as needed for nausea -will f/u    Mikey Bussing, NP 09/12/2018  2:54 PM   Addendum  I have seen the patient, examined him. I agree with the assessment and and plan and have edited  the notes.   Pt received second week dexa and Velcade today, no issues. Revlimid was shipped, I will bring to [facility] when I receives it. He has made significant progress in rehab, able to walk and use bathroom independently. I encourage him to continue PT. IV antibiotics course  per primary team. Since his cultures were negative, I think 7 days therapy will be adequate.   His bone marrow biopsy cytogenetics came back normal.   Truitt Merle  09/12/2018

## 2018-09-12 NOTE — Progress Notes (Signed)
Occupational Therapy Session Note  Patient Details  Name: Edward Todd MRN: [id number] Date of Birth: 26-Apr-1975  Today's Date: 09/12/2018 OT Individual Time: 1100-1110 OT Individual Time Calculation (min): 10 min  and Today's Date: 09/12/2018 OT Missed Time: 35 Minutes Missed Time Reason: Unavailable (comment)(unwilling to end phone call)   Short Term Goals: Week 1:  OT Short Term Goal 1 (Week 1): Patient will complete bed mobility and functional transfers with mod A OT Short Term Goal 2 (Week 1): patient will tolerate OOB for 1 hour at a time and full therapy schedule OT Short Term Goal 3 (Week 1): assess visual complaints and follow up as appropriate OT Short Term Goal 4 (Week 1): Patient will complete UB bathing and dressing with min A, LB bathing and dressing with mod A  Skilled Therapeutic Interventions/Progress Updates:    Pt received supine with no c/o pain. Clarified pt questions re chemo therapy administration with RN for pt and discussed return to activity and OT POC with pt. Upon re-entering room pt on phone and continuing to talk for 30 min despite alerting him to session needing to begin and OT standing next to bed. Pt left supine with all needs met, 35 min missed.   Therapy Documentation Precautions:  Precautions Precautions: Fall, Back Precaution Comments: back precautions/logroll Restrictions Weight Bearing Restrictions: No Other Position/Activity Restrictions: T3, T6 - T 10 path Fx   Therapy/Group: Individual Therapy  Curtis Sites 09/12/2018, 7:30 AM

## 2018-09-12 NOTE — Progress Notes (Signed)
[facility] PHYSICAL MEDICINE & REHABILITATION PROGRESS NOTE  Subjective/Complaints: Patient seen laying in bed this morning.  He states he slept well overnight.  He notes intermittent cough.  ROS: + Intermittent cough.  Denies CP, shortness of breath, nausea, vomiting, diarrhea.  Objective: Vital Signs: Blood pressure 125/84, pulse 79, temperature 98.8 F (37.1 C), temperature source Oral, resp. rate 18, height 6' 3.51" (1.918 m), weight 130.7 kg, SpO2 100 %. No results found. Recent Labs    09/10/18 0703 09/12/18 0419  WBC 4.8 4.1  HGB 7.7* 7.8*  HCT 22.3* 23.2*  PLT 195 222   Recent Labs    09/10/18 0703 09/12/18 0419  NA 129* 134*  K 3.7 3.5  CL 103 107  CO2 20* 20*  GLUCOSE 94 106*  BUN 13 12  CREATININE 1.09 1.02  CALCIUM 7.4* 7.5*    Physical Exam: BP 125/84 (BP Location: Left Arm)   Pulse 79   Temp 98.8 F (37.1 C) (Oral)   Resp 18   Ht 6' 3.51" (1.918 m)   Wt 130.7 kg   SpO2 100%   BMI 35.53 kg/m  Constitutional: No distress . Vital signs reviewed. HENT: Normocephalic.  Atraumatic. Eyes: EOMI.  No discharge. Cardiovascular: No JVD. Respiratory: Normal effort. GI: Non-distended. Musc: No edema or tenderness in extremities, unchanged Neurological: Alert and oriented Good awareness of deficits.  Motor: 4-/5 throughout (pain inhibition), stable Skin: Skin iswarmand dry.  Psychiatric: He has anormal mood and affect. Hisbehavior is normal.Judgmentand thought contentnormal.   Assessment/Plan: 1. Functional deficits secondary to multiple myeloma which require 3+ hours per day of interdisciplinary therapy in a comprehensive inpatient rehab setting.  Physiatrist is providing close team supervision and 24 hour management of active medical problems listed below.  Physiatrist and rehab team continue to assess barriers to discharge/monitor patient progress toward functional and medical goals  Care Tool:  Bathing  Bathing activity did not occur:  Safety/medical concerns           Bathing assist       Upper Body Dressing/Undressing Upper body dressing Upper body dressing/undressing activity did not occur (including orthotics): Safety/medical concerns      Upper body assist      Lower Body Dressing/Undressing Lower body dressing    Lower body dressing activity did not occur: Safety/medical concerns       Lower body assist       Toileting Toileting    Toileting assist Assist for toileting: Moderate Assistance - Patient 50 - 74% Assistive Device Comment: of urinal per RN report    Transfers Chair/bed transfer  Transfers assist  Chair/bed transfer activity did not occur: Safety/medical concerns  Chair/bed transfer assist level: Minimal Assistance - Patient > 75%     Locomotion Ambulation   Ambulation assist   Ambulation activity did not occur: Safety/medical concerns  Assist level: Minimal Assistance - Patient > 75% Assistive device: Walker-rolling Max distance: 5'   Walk 10 feet activity   Assist  Walk 10 feet activity did not occur: Safety/medical concerns        Walk 50 feet activity   Assist Walk 50 feet with 2 turns activity did not occur: Safety/medical concerns         Walk 150 feet activity   Assist Walk 150 feet activity did not occur: Safety/medical concerns         Walk 10 feet on uneven surface  activity   Assist Walk 10 feet on uneven surfaces activity did not occur: Safety/medical  concerns         Wheelchair     Assist Will patient use wheelchair at discharge?: Yes Type of Wheelchair: Manual Wheelchair activity did not occur: Safety/medical concerns(low hemoglobin/fatigue and pain/back spasms)  Wheelchair assist level: Supervision/Verbal cueing Max wheelchair distance: 100'    Wheelchair 50 feet with 2 turns activity    Assist    Wheelchair 50 feet with 2 turns activity did not occur: Safety/medical concerns   Assist Level:  Supervision/Verbal cueing   Wheelchair 150 feet activity     Assist Wheelchair 150 feet activity did not occur: Safety/medical concerns          Medical Problem List and Plan: 1.Decreased functional mobilitysecondary to newly diagnosed multiple myeloma.  Continue CIR  Weekly Velcade and dexamethasone initiated 09/05/2018 per Heme/Onc, plan for dose for today  Team conference today to discuss current and goals and coordination of care, home and environmental barriers, and discharge planning with nursing, case manager, and therapies.  2. Antithrombotics: -DVT/anticoagulation:Subcutaneous Lovenox -antiplatelet therapy: Aspirin 81 mg daily 3. Pain Management:Oxycodone as needed -scheduled flexeril TID -kpad prn for spasms 4. Mood:Provide emotional support -antipsychotic agents: N/A 5. Neuropsych: This patientiscapable of making decisions on hisown behalf. 6. Skin/Wound Care:Routine skin checks 7. Fluids/Electrolytes/Nutrition:Routine in and outs   Remeron started 5/18-appetite overall improving on 5/20 8.Acute on chronic anemia. Follow-up hematology oncology services  Hemoglobin 7.8 on 5/20 transfusion on 5/17  Hemoccult negative  Recs per Heme/Onc 9. Diabetes mellitus. Hemoglobin A1c 9.4. Provide education  Metformin 500 twice daily  Relatively controlled on 5/20 10. Morbid obesity. BMI 35.53. Dietary follow-up 11. Constipation. Laxative assistance 12.  Hyponatremia  Sodium 134 on 5/20  Continue to monitor 13.  Fevers  Started on IV Levaquin on 5/16 and?  Acyclovir  Improving 14.  Sleep disturbance  Likely exacerbated by steroids  No benefit with Ambien  Remeron started on 5/18  Improving  LOS: 5 days A FACE TO FACE EVALUATION WAS PERFORMED  Edward Todd Edward Todd 09/12/2018, 9:18 AM

## 2018-09-12 NOTE — Telephone Encounter (Signed)
Oral Chemotherapy Pharmacist Encounter   I spoke with patient's wife, Santa Genera, for overview of: Revlimid (lenolidomide)for the treatment of newly diagnosed multiple myeloma not having achieved remissionin conjunction with Velcade (bortezimib) and dexamethasone, planned duration4-6 induction cycles then reassessment.   He is currently admitted at inpatient rehab facility at [facility]. His medications are being administered per his MAR.  Counseledon administration, dosing, side effects, monitoring, drug-food interactions, safe handling, storage, and disposal.  Patient will take Revlimid 60m capsules, 1 capsule by mouth once daily, without regard to food, with a full glass of water.  Revlimid will be given 14 days on, 7 days off, repeat every 21 days.  Patient is receiving dexamethasone 48mtablets, 10 tablets (4052mby mouth once weekly with breakfast.  Patient is receiving Velcade at 1.3 mg/m2 once weekly.  Velcade and dexamethasone start date: 09/05/18 Revlimid start date: 5/20 or 5/21  Adverse effects of Revlimid include but are not limited to: nausea, constipation, diarrhea, abdominal pain, rash, fatigue, drug fever, and decreased blood counts.    Shawna informed about severe teratogenic effects of Revlimid and need for prophylaxis during intercourse, and procedure of [employer] REMS program. ShaSanta Generaformed patient can not donate blood while on therapy.  Reviewed importance of keeping a medication schedule and plan for any missed doses.  Medication reconciliation performed and medication/allergy list updated. Patient will discontinue ascorbic acid supplementation while on treatment with Velcade. This has been removed from his medication list.  Patient is being administered acyclovir for VZV prophylaxis while admitted. We discussed that this medication will continue once discharged. Patient is being administered enoxaparin 13m68m once daily and aspirin  81mg34me daily for thromboprophylaxis while admitted. We discussed that the enoxaparin will likely be discontinued on discharge, and that patient will continue on aspirin 81mg 53my as long as he is on Revlimid treatment. Importance of both of these supportive therapy measures discussed with Shawna.  We also briefly discussed induction medication and benefits of each of the Velcade, dexamethasone, and Revlimid.  Insurance authorization for Revlimid has been obtained.  Revlimid prescription is being dispensed from [facility] specialty pharmacy as it is a limited distribution medication and this is the dispensing pharmacy mandated by patient's insurance. Patient has been enrolled into a copayment card coupon program with [employer] to receive Revlimid at no more than $25 per fill. This is being delivered to MD office on today and [facility]  All questions answered.  They know to call the office with questions or concerns.  Jesse Johny DrillingmD, BCPS, BCOP  09/12/2018  2:55 PM  Oral Oncology Clinic [phone]

## 2018-09-13 ENCOUNTER — Inpatient Hospital Stay (HOSPITAL_COMMUNITY): Payer: 59 | Admitting: Occupational Therapy

## 2018-09-13 ENCOUNTER — Inpatient Hospital Stay (HOSPITAL_COMMUNITY): Payer: 59 | Admitting: Physical Therapy

## 2018-09-13 ENCOUNTER — Inpatient Hospital Stay (HOSPITAL_COMMUNITY): Payer: 59

## 2018-09-13 DIAGNOSIS — R030 Elevated blood-pressure reading, without diagnosis of hypertension: Secondary | ICD-10-CM

## 2018-09-13 DIAGNOSIS — E119 Type 2 diabetes mellitus without complications: Secondary | ICD-10-CM

## 2018-09-13 LAB — CULTURE, BLOOD (SINGLE)
Culture: NO GROWTH
Special Requests: ADEQUATE

## 2018-09-13 LAB — GLUCOSE, CAPILLARY
Glucose-Capillary: 163 mg/dL — ABNORMAL HIGH (ref 70–99)
Glucose-Capillary: 172 mg/dL — ABNORMAL HIGH (ref 70–99)
Glucose-Capillary: 136 mg/dL — ABNORMAL HIGH (ref 70–99)
Glucose-Capillary: 172 mg/dL — ABNORMAL HIGH (ref 70–99)

## 2018-09-13 MED ORDER — METFORMIN HCL 500 MG PO TABS
1000.0000 mg | ORAL_TABLET | Freq: Two times a day (BID) | ORAL | Status: DC
  Administered 2018-09-13 – 2018-09-25 (×24): 1000 mg via ORAL
  Filled 2018-09-13 (×24): qty 2

## 2018-09-13 MED ORDER — LENALIDOMIDE 25 MG PO CAPS
25.0000 mg | ORAL_CAPSULE | Freq: Every day | ORAL | Status: DC
  Administered 2018-09-14 – 2018-09-25 (×12): 25 mg via ORAL
  Filled 2018-09-13 (×12): qty 1

## 2018-09-13 NOTE — Progress Notes (Signed)
Occupational Therapy Session Note  Patient Details  Name: Edward Todd MRN: [id number] Date of Birth: 30-Jan-1976  Today's Date: 09/13/2018 OT Individual Time: 6816-6196 OT Individual Time Calculation (min): 43 min    Short Term Goals: Week 1:  OT Short Term Goal 1 (Week 1): Patient will complete bed mobility and functional transfers with mod A OT Short Term Goal 2 (Week 1): patient will tolerate OOB for 1 hour at a time and full therapy schedule OT Short Term Goal 3 (Week 1): assess visual complaints and follow up as appropriate OT Short Term Goal 4 (Week 1): Patient will complete UB bathing and dressing with min A, LB bathing and dressing with mod A  Skilled Therapeutic Interventions/Progress Updates:    Pt greeted seated in wc and agreeable to OT treatment session. Pt stated he has had a great day so far. B UE strength and coordination with wc propulsion to therapy gym. Continued working on UB strength wc 12 mins on [employer] arm bike on level 4. Pt then ambulated in the gym, then back to his room with CGA, RW, and was able to walk 275 feet! Pt reported need to go to the bathroom. Ambulated into bathroom w/ RW and CGA. Pt left seated on commode with needs met and informed to call for assistance when finished.   Therapy Documentation Precautions:  Precautions Precautions: Fall, Back Precaution Comments: back precautions/logroll Restrictions Weight Bearing Restrictions: No Other Position/Activity Restrictions: T3, T6 - T 10 path Fx Pain: Pain Assessment Pain Scale: 0-10 Pain Score: 6  Pain Type: Acute pain Pain Location: Back Pain Orientation: Lower Pain Descriptors / Indicators: Aching;Dull;Discomfort Pain Frequency: Occasional Pain Onset: Progressive Pain Intervention(s): Repositioned;Distraction   Therapy/Group: Individual Therapy  Valma Cava 09/13/2018, 1:20 PM

## 2018-09-13 NOTE — Progress Notes (Addendum)
Occupational Therapy Session Note  Patient Details  Name: Edward Todd MRN: [id number] Date of Birth: 1976-01-10  Today's Date: 09/13/2018 OT Individual Time: 1453-1531       Make up session OT Individual Time Calculation (min): 38 min    Short Term Goals: Week 1:  OT Short Term Goal 1 (Week 1): Patient will complete bed mobility and functional transfers with mod A OT Short Term Goal 2 (Week 1): patient will tolerate OOB for 1 hour at a time and full therapy schedule OT Short Term Goal 3 (Week 1): assess visual complaints and follow up as appropriate OT Short Term Goal 4 (Week 1): Patient will complete UB bathing and dressing with min A, LB bathing and dressing with mod A  Skilled Therapeutic Interventions/Progress Updates:    Pt pleasant and willing to participate in make up session.  He completed transfer to the EOB with supervision.  Once sitting he worked on donning his shoes.  Min assist needed to place his heel all the way in the shoe as he could not cross one LE over the opposite knee this session sitting EOB.  Min guard for sit to stand and for functional mobility with use of the RW down to the ortho gym.  Once in the gym focused on LE strengthening as pt reports this is a big issue for him.  He performed 3 sets of 10 repetitions for squats with pt squatting down to the elevated mat and then transitioning to standing.   For two sets he held onto the walker for support and for the final set he kept his hands on his knees.  Min assist for balance and stability.  Returned to room at end of session with call button and phone in reach and pt resting in bed.  Bed alarm in place as well.   Therapy Documentation Precautions:  Precautions Precautions: Fall, Back Precaution Comments:   Restrictions Weight Bearing Restrictions: No Other Position/Activity Restrictions: T3, T6 - T 10 path Fx  Pain: Pain Assessment Pain Scale: Faces Pain Score: 6  Faces Pain Scale: Hurts a little  bit Pain Type: Acute pain Pain Location: Back Pain Orientation: Lower Pain Descriptors / Indicators: Discomfort Pain Frequency: Occasional Pain Onset: With Activity Pain Intervention(s): Repositioned ADL: See Care Tool Section for some details of ADL  Therapy/Group: Individual Therapy  Machael Raine OTR/L 09/13/2018, 4:34 PM

## 2018-09-13 NOTE — Progress Notes (Signed)
Social Work Patient ID: Edward Todd, male   DOB: [birth year], 43 y.o.   MRN: [id number]  Have reviewed team conference with pt and wife.  Both aware and agreeable with targeted d/c date of 6/3 and supervision/ min assist goals overall. Pt hopeful he will reach goals and be ready for d/c sooner.  Eager to be home with family.  Continue to follow.  Lyza Houseworth, LCSW

## 2018-09-13 NOTE — Progress Notes (Signed)
Daily chemo chemo medication rec'd (Revilmed) 25mg  caps taken to pharmacy (14 ct) to be administered by RN in the AM. Medication will have to be picked up by the nurse.

## 2018-09-13 NOTE — Progress Notes (Signed)
Physical Therapy Session Note  Patient Details  Name: Edward Todd MRN: [id number] Date of Birth: May 02, 1975  Today's Date: 09/13/2018 PT Individual Time: 0905-1000 PT Individual Time Calculation (min): 55 min   Short Term Goals: Week 1:  PT Short Term Goal 1 (Week 1): Pt will perform bed mobility with min assist PT Short Term Goal 2 (Week 1): Pt will perform sit<>stand with mod assist PT Short Term Goal 3 (Week 1): Pt will initiate transfer training from bed<>w/c PT Short Term Goal 4 (Week 1): Pt will tolerate >30 min of OOB activity to participate in therapy   Skilled Therapeutic Interventions/Progress Updates:   Pt in supine and agreeable to therapy, 2/10 pain in back. Session focused on overall OOB tolerance and functional endurance. Supine>sit w/ supervision and increased time, although pt is moving at a more functional speed. Pt requesting to shave. Sit<>stand w/ CGA-supervision and ambulated to sink, 10' w/ CGA and very slow speed. Stood at sink for 20+ minutes w/ CGA to close supervision to brush teeth and shave. Pt able to intermittently take BUEs off walker to perform static standing balance tasks, CGA and no overt LOB. Pt's UEs w/ improved endurance and functional strength this session, able to reach up to face/head w/o support on table or from therapist. Pt finished self-care tasks from seated level at sink w/ supervision. Ambulated to toilet, ended session on toilet, call bell in reach. Verbalizes understanding that he needs to call RN for assistance prior to standing.   Therapy Documentation Precautions:  Precautions Precautions: Fall, Back Precaution Comments: back precautions/logroll Restrictions Weight Bearing Restrictions: No Other Position/Activity Restrictions: T3, T6 - T 10 path Fx Vital Signs:   Therapy/Group: Individual Therapy  Mckynzie Liwanag K Raeonna Milo 09/13/2018, 10:00 AM

## 2018-09-13 NOTE — Telephone Encounter (Signed)
Oral Oncology Patient Advocate Encounter  Revlimid was delivered to the pharmacy.   I left it with Dr. Lewayne Bunting nurse, Malachy Mood this morning.  [place] Patient [place] Phone [phone] Fax [phone] 09/13/2018   8:57 AM

## 2018-09-13 NOTE — Progress Notes (Signed)
Occupational Therapy Session Note  Patient Details  Name: Edward Todd MRN: [id number] Date of Birth: 01-14-1976  Today's Date: 09/13/2018 OT Individual Time: 1045-1130 OT Individual Time Calculation (min): 45 min    Short Term Goals: Week 1:  OT Short Term Goal 1 (Week 1): Patient will complete bed mobility and functional transfers with mod A OT Short Term Goal 2 (Week 1): patient will tolerate OOB for 1 hour at a time and full therapy schedule OT Short Term Goal 3 (Week 1): assess visual complaints and follow up as appropriate OT Short Term Goal 4 (Week 1): Patient will complete UB bathing and dressing with min A, LB bathing and dressing with mod A  Skilled Therapeutic Interventions/Progress Updates:    1:1. Pt received on toilet after BM. Pt with minimal spilling of urine on floor d/t no guard on BSC over toilet. Pt completes hygiene in standing with grab bar and MIN A. Pt dons pants and underwear with reacher. Pt bathes UB seated in w/c and feet crossing into figure 4. Pt completes donning shirt with supervision. Pt completes footwear with VC for crossing into figure four for donning socks and shoes. Exited session with pt seated in w/c, call light in reach and exit alarm on  Therapy Documentation Precautions:  Precautions Precautions: Fall, Back Precaution Comments: back precautions/logroll Restrictions Weight Bearing Restrictions: No Other Position/Activity Restrictions: T3, T6 - T 10 path Fx General:   Vital Signs:  Pain:   ADL: ADL Eating: Other (comment)(patient needs assist at this time for cup to mouth due to pain and fatigue) Where Assessed-Eating: Bed level Grooming: Not assessed(unable to tolerate) Upper Body Bathing: Not assessed(unable to tolerate) Lower Body Bathing: Not assessed(unable to tolerate) Upper Body Dressing: Not assessed(unable to tolerate) Lower Body Dressing: Not assessed(unable to tolerate) Toileting: Not assessed(unable to  tolerate) Toilet Transfer: Not assessed(unable to tolerate) Social research officer, government: Not assessed(unable to tolerate) ADL Comments: patient able to wash his face with min A Vision   Perception    Praxis   Exercises:   Other Treatments:     Therapy/Group: Individual Therapy  Tonny Branch 09/13/2018, 11:36 AM

## 2018-09-13 NOTE — Patient Care Conference (Signed)
Inpatient RehabilitationTeam Conference and Plan of Care Update Date: 09/12/2018   Time: 2:35 PM    Patient Name: Edward Todd      Medical Record Number: [id number]  Date of Birth: 05/03/1975 Sex: Male         Room/Bed: 4M10C/4M10C-01 Payor Info: Payor: [employer] / Plan: [employer] NAP / Product Type: *No Product type* /    Admitting Diagnosis: ABI Team  Debility, multipe myeloma; 15-17days  Admit Date/Time:  09/07/2018  3:41 PM Admission Comments: No comment available   Primary Diagnosis:  <principal problem not specified> Principal Problem: <principal problem not specified>  Patient Active Problem List   Diagnosis Date Noted  . Diabetes mellitus type 2 in nonobese (HCC)   . Elevated blood pressure reading   . Hyponatremia   . Diabetes mellitus type 2 in obese (HCC)   . Acute on chronic anemia   . Malaise   . FUO (fever of unknown origin)   . Sleep disturbance   . HTN (hypertension) 09/05/2018  . Type 2 diabetes mellitus without complication (HCC) 09/05/2018  . Hypophosphatemia 09/05/2018  . Constipation 09/05/2018  . Anemia of chronic disease 09/05/2018  . Multiple myeloma (HCC)   . Symptomatic anemia 08/30/2018  . Acute bilateral low back pain   . Weakness of both lower extremities     Expected Discharge Date: Expected Discharge Date: 09/26/18  Team Members Present: Physician leading conference: Dr. Ankit Patel Social Worker Present: Lucy Hoyle, LCSW Nurse Present: Karen Winter, RN PT Present: Amy Tally, PT OT Present: Stephanie Schlosser, OT SLP Present: Courtney Payne, SLP PPS Coordinator present : Becky Windsor, PT     Current Status/Progress Goal Weekly Team Focus  Medical   Decreased functional mobility secondary to newly diagnosed multiple myeloma.   Improve mobility, sleep, appetite  See above   Bowel/Bladder   Continent of bowel and bladder LBM-5/19  Maintain continence and regular bowel and bladder regimen  Assist with toileting needs prn    Swallow/Nutrition/ Hydration             ADL's   Max A BADL tasks  CGA/min A  activity tolerance, functional transfers, self-care retraining, general strengthening   Mobility   min assist overall for bed mobility, transfers, and gait w/ RW 5' at a time; moves very slowly and cautiously w/ all movements   supervision-CGA overall household gait  OOB tolerance, endurance, all functional mobility, LE strengthening   Communication             Safety/Cognition/ Behavioral Observations            Pain   Frequent complaints of mid/lower back pain, managed with prn oxycodone every 8  Pain level <4/10  Assess pain every shift and treat as ordered   Skin   No skin issues  Maintain skin integrity  Assess skin every shift and as needed    Rehab Goals Patient on target to meet rehab goals: Yes *See Care Plan and progress notes for long and short-term goals.     Barriers to Discharge  Current Status/Progress Possible Resolutions Date Resolved   Physician    Medical stability;Pending chemo/radiation     See above  Therapies, optimize sleep/appetite meds      Nursing                  PT  Medical stability;Pending chemo/radiation                 OT                    SLP                SW                Discharge Planning/Teaching Needs:  Pt to return home with wife and children.  Wife able to provide 24/7 assistance.  Planning teaching closer to d/c date.   Team Discussion:  +fever over weekend - IV abx started but plan to stop today.  Poor appetite.  Hem/onc continues to follow.  C/o back pain but declines meds.  Min - max assist overall with therapies and supervision to min goals.    Revisions to Treatment Plan:  NA    Continued Need for Acute Rehabilitation Level of Care: The patient requires daily medical management by a physician with specialized training in physical medicine and rehabilitation for the following conditions: Daily direction of a multidisciplinary physical  rehabilitation program to ensure safe treatment while eliciting the highest outcome that is of practical value to the patient.: Yes Daily medical management of patient stability for increased activity during participation in an intensive rehabilitation regime.: Yes Daily analysis of laboratory values and/or radiology reports with any subsequent need for medication adjustment of medical intervention for : Other   I attest that I was present, lead the team conference, and concur with the assessment and plan of the team.   Lennart Pall 09/13/2018, 4:06 PM   Team conference was held via web/ teleconference due to [place] - 19

## 2018-09-13 NOTE — Progress Notes (Signed)
[place] PHYSICAL MEDICINE & REHABILITATION PROGRESS NOTE  Subjective/Complaints: Patient seen laying in bed this morning.  He states he slept well overnight.  He states he feels better.  ROS: Denies CP, shortness of breath, nausea, vomiting, diarrhea.  Objective: Vital Signs: Blood pressure (!) 133/111, pulse 98, temperature 98.6 F (37 C), temperature source Oral, resp. rate (!) 23, height 6' 3.51" (1.918 m), weight 130.7 kg, SpO2 98 %. No results found. Recent Labs    09/12/18 0419  WBC 4.1  HGB 7.8*  HCT 23.2*  PLT 222   Recent Labs    09/12/18 0419  NA 134*  K 3.5  CL 107  CO2 20*  GLUCOSE 106*  BUN 12  CREATININE 1.02  CALCIUM 7.5*    Physical Exam: BP (!) 133/111 (BP Location: Left Arm)   Pulse 98   Temp 98.6 F (37 C) (Oral)   Resp (!) 23   Ht 6' 3.51" (1.918 m)   Wt 130.7 kg   SpO2 98%   BMI 35.53 kg/m  Constitutional: No distress . Vital signs reviewed. HENT: Normocephalic.  Atraumatic. Eyes: EOMI.  No discharge. Cardiovascular: No JVD. Respiratory: Normal effort. GI: Non-distended. Musc: No edema or tenderness in extremities, stable Neurological: Alert and oriented Good awareness of deficits.  Motor: 4-/5 throughout (pain inhibition), improving Skin: Skin iswarmand dry.  Psychiatric: He has anormal mood and affect. Hisbehavior is normal.Judgmentand thought contentnormal.   Assessment/Plan: 1. Functional deficits secondary to multiple myeloma which require 3+ hours per day of interdisciplinary therapy in a comprehensive inpatient rehab setting.  Physiatrist is providing close team supervision and 24 hour management of active medical problems listed below.  Physiatrist and rehab team continue to assess barriers to discharge/monitor patient progress toward functional and medical goals  Care Tool:  Bathing  Bathing activity did not occur: Safety/medical concerns Body parts bathed by patient: Right arm, Left arm, Abdomen, Chest,  Front perineal area, Buttocks, Right upper leg, Left upper leg, Right lower leg, Left lower leg, Face         Bathing assist Assist Level: Minimal Assistance - Patient > 75%     Upper Body Dressing/Undressing Upper body dressing Upper body dressing/undressing activity did not occur (including orthotics): Safety/medical concerns What is the patient wearing?: Pull over shirt    Upper body assist Assist Level: Supervision/Verbal cueing    Lower Body Dressing/Undressing Lower body dressing    Lower body dressing activity did not occur: Safety/medical concerns What is the patient wearing?: Underwear/pull up, Pants     Lower body assist Assist for lower body dressing: Minimal Assistance - Patient > 75%(reacher)     Toileting Toileting    Toileting assist Assist for toileting: Minimal Assistance - Patient > 75% Assistive Device Comment: of urinal per RN report    Transfers Chair/bed transfer  Transfers assist  Chair/bed transfer activity did not occur: Safety/medical concerns  Chair/bed transfer assist level: Contact Guard/Touching assist     Locomotion Ambulation   Ambulation assist   Ambulation activity did not occur: Safety/medical concerns  Assist level: Contact Guard/Touching assist Assistive device: Walker-rolling Max distance: 10'   Walk 10 feet activity   Assist  Walk 10 feet activity did not occur: Safety/medical concerns  Assist level: Contact Guard/Touching assist Assistive device: Walker-rolling   Walk 50 feet activity   Assist Walk 50 feet with 2 turns activity did not occur: Safety/medical concerns         Walk 150 feet activity   Assist Walk 150  feet activity did not occur: Safety/medical concerns         Walk 10 feet on uneven surface  activity   Assist Walk 10 feet on uneven surfaces activity did not occur: Safety/medical concerns         Wheelchair     Assist Will patient use wheelchair at discharge?: Yes Type of  Wheelchair: Manual Wheelchair activity did not occur: Safety/medical concerns(low hemoglobin/fatigue and pain/back spasms)  Wheelchair assist level: Supervision/Verbal cueing Max wheelchair distance: 100'    Wheelchair 50 feet with 2 turns activity    Assist    Wheelchair 50 feet with 2 turns activity did not occur: Safety/medical concerns   Assist Level: Supervision/Verbal cueing   Wheelchair 150 feet activity     Assist Wheelchair 150 feet activity did not occur: Safety/medical concerns          Medical Problem List and Plan: 1.Decreased functional mobilitysecondary to newly diagnosed multiple myeloma.  Continue CIR  Weekly Velcade and dexamethasone initiated 09/05/2018 per Heme/Onc 2. Antithrombotics: -DVT/anticoagulation:Subcutaneous Lovenox -antiplatelet therapy: Aspirin 81 mg daily 3. Pain Management:Oxycodone as needed -scheduled flexeril TID -kpad prn for spasms 4. Mood:Provide emotional support -antipsychotic agents: N/A 5. Neuropsych: This patientiscapable of making decisions on hisown behalf. 6. Skin/Wound Care:Routine skin checks 7. Fluids/Electrolytes/Nutrition:Routine in and outs   Remeron started 5/18- significant increase in appetite 8.Acute on chronic anemia. Follow-up hematology oncology services  Hemoglobin 7.8 on 5/20 transfusion on 5/17  Hemoccult negative  Recs per Heme/Onc 9. Diabetes mellitus. Hemoglobin A1c 9.4. Provide education  Metformin 500 twice daily, increased to 1000 twice daily on 5/21 10. Morbid obesity. BMI 35.53. Dietary follow-up 11. Constipation. Laxative assistance 12.  Hyponatremia  Sodium 134 on 5/20  Continue to monitor 13.  Fevers: Resolved  Completed 5-day course of IV Levaquin  14.  Sleep disturbance  Likely exacerbated by steroids  No benefit with Ambien  Remeron started on 5/18  Improving 15.  Elevated diastolic blood  pressure  Continue to monitor  LOS: 6 days A FACE TO FACE EVALUATION WAS PERFORMED  Edward Todd [name] 09/13/2018, 12:03 PM

## 2018-09-14 ENCOUNTER — Inpatient Hospital Stay (HOSPITAL_COMMUNITY): Payer: 59 | Admitting: Physical Therapy

## 2018-09-14 ENCOUNTER — Inpatient Hospital Stay (HOSPITAL_COMMUNITY): Payer: 59 | Admitting: Occupational Therapy

## 2018-09-14 ENCOUNTER — Inpatient Hospital Stay (HOSPITAL_COMMUNITY): Payer: 59

## 2018-09-14 LAB — GLUCOSE, CAPILLARY
Glucose-Capillary: 121 mg/dL — ABNORMAL HIGH (ref 70–99)
Glucose-Capillary: 107 mg/dL — ABNORMAL HIGH (ref 70–99)
Glucose-Capillary: 107 mg/dL — ABNORMAL HIGH (ref 70–99)
Glucose-Capillary: 97 mg/dL (ref 70–99)

## 2018-09-14 MED ORDER — CALCIUM CARBONATE-VITAMIN D 500-200 MG-UNIT PO TABS
1.0000 | ORAL_TABLET | Freq: Two times a day (BID) | ORAL | Status: DC
  Administered 2018-09-14 – 2018-09-25 (×22): 1 via ORAL
  Filled 2018-09-14 (×22): qty 1

## 2018-09-14 NOTE — Progress Notes (Signed)
Occupational Therapy Session Note  Patient Details  Name: Edward Todd MRN: [id number] Date of Birth: 03-18-1976  Today's Date: 09/14/2018 OT Individual Time: 1006-1100 OT Individual Time Calculation (min): 54 min    Short Term Goals: Week 1:  OT Short Term Goal 1 (Week 1): Patient will complete bed mobility and functional transfers with mod A OT Short Term Goal 2 (Week 1): patient will tolerate OOB for 1 hour at a time and full therapy schedule OT Short Term Goal 3 (Week 1): assess visual complaints and follow up as appropriate OT Short Term Goal 4 (Week 1): Patient will complete UB bathing and dressing with min A, LB bathing and dressing with mod A     Skilled Therapeutic Interventions/Progress Updates:    Pt received in bed and talked about how he felt so much better yesterday and did so many activities and even walked a little this am but is now is severe pain from spasms in his back. Pt did want to try to participated and discussed working on his UB and LB strength. Went to obtain theraband to use for UB exercises. Returned and pt ready to try to get up.  Reviewed back precautions and safe bed mobility techniques.  Encouraged pt to flex knees and roll onto his side, he was only able to get part way and then had major spasms.   He said he needed to stay in this position for awhile, discussed his recent medical history, his job accident.  Tried again to have pt continue rolling and pt said his back was having another spasm.  Lightly touched pt's back to see if I could feel a knot and pt screamed out in pain.  He yelled that any touch was causing him great pain.  Pt did not want help getting back into supine and wanted to do it slowly.  Allowed pt this time, which took a very long time for him to be able to move a small amount every few minutes.  Pt not fully in supine but in a half side lying position which he stated he needed to stay in for awhile.  Helped pt to get more adjusted with  pillows and ensured pt had what he needed in reach. Pt resting with all needs met. Discussed pt's pain with his RN.  Bed alarm set.   Therapy Documentation Precautions:  Precautions Precautions: Fall, Back Precaution Comments:   Restrictions Weight Bearing Restrictions: No Other Position/Activity Restrictions: T3, T6 - T 10 path Fx         Pain: Pain Assessment Pain Score: 10-Worst pain ever Pain Type: Acute pain;Neuropathic pain;Intractable pain Pain Location: Back Pain Orientation: Lower;Left;Right Pain Descriptors / Indicators: Sharp;Spasm Pain Onset: On-going Pain Intervention(s): Heat applied;Repositioned;Rest, RN made aware   Therapy/Group: Individual Therapy  [place] 09/14/2018, 11:01 AM

## 2018-09-14 NOTE — Progress Notes (Signed)
Physical Therapy Session Note  Patient Details  Name: Edward Todd MRN: [id number] Date of Birth: 22-Nov-1975  Today's Date: 09/14/2018 PT Individual Time: 0832-0906 PT Individual Time Calculation (min): 34 min   Short Term Goals: Week 1:  PT Short Term Goal 1 (Week 1): Pt will perform bed mobility with min assist PT Short Term Goal 2 (Week 1): Pt will perform sit<>stand with mod assist PT Short Term Goal 3 (Week 1): Pt will initiate transfer training from bed<>w/c PT Short Term Goal 4 (Week 1): Pt will tolerate >30 min of OOB activity to participate in therapy   Skilled Therapeutic Interventions/Progress Updates:    Pt seated EOB and working on breakfast. Slowed movements due to back spasms/pain and very slow to initiate mobility requiring extra time. Pt performed seated postural exercises and PT provided education in regards to body biomechanics, importance of posture and use of core/abdominal muscles during mobility to protect back as well as use of breath during exertion and mobility. Practiced this during session with sit <> stands, bed mobility and gait with RW. With extra time and 2 attempts, pt able to perform sit <> stand with RW from elevated EOB (pt very tall) with CGA and verbal cues for hand placement/technique. Pt able to gait with RW x 130' with CGA/supervision assist and very slow gait speed noted. Cues for widening BOS during gait and turning technique. Pt with slow and guarded movement throughout session. Returned to bed end of session with min assist for management of BLE back into bed due to tightness and pain. Educated on log roll technique.   Therapy Documentation Precautions:  Precautions Precautions: Fall, Back Precaution Comments:   Restrictions Weight Bearing Restrictions: No Other Position/Activity Restrictions: T3, T6 - T 10 path Fx   Pain:  Reports back spasms and tightness - RN aware and awaiting medication.    Therapy/Group: Individual  Therapy  Canary Brim Ivory Broad, PT, DPT, CBIS  09/14/2018, 9:16 AM

## 2018-09-14 NOTE — Plan of Care (Signed)
  Problem: RH BOWEL ELIMINATION Goal: RH STG MANAGE BOWEL W/MEDICATION W/ASSISTANCE Description STG Manage Bowel with Medication Min with Assistance.  Outcome: Progressing   Problem: RH SKIN INTEGRITY Goal: RH STG SKIN FREE OF INFECTION/BREAKDOWN Description Patient free from skin breakdown during admission  Outcome: Progressing Goal: RH STG MAINTAIN SKIN INTEGRITY WITH ASSISTANCE Description STG Maintain Skin Integrity With [place].  Outcome: Progressing   Problem: RH SAFETY Goal: RH STG ADHERE TO SAFETY PRECAUTIONS W/ASSISTANCE/DEVICE Description STG Adhere to Safety Precautions With Min Assistance/Device.  Outcome: Progressing   Problem: RH PAIN MANAGEMENT Goal: RH STG PAIN MANAGED AT OR BELOW PT'S PAIN GOAL Description Pain free or pain less than 3 during admission  Outcome: Not Progressing   Problem: RH PAIN MANAGEMENT Goal: RH STG PAIN MANAGED AT OR BELOW PT'S PAIN GOAL Description Pain free or pain less than 3 during admission  Outcome: Not Progressing   Problem: RH PAIN MANAGEMENT Goal: RH STG PAIN MANAGED AT OR BELOW PT'S PAIN GOAL Description Pain free or pain less than 3 during admission  Outcome: Not Progressing

## 2018-09-14 NOTE — Progress Notes (Signed)
Dean Goldner   DOB:1975-06-30   ZO#:[id number]   JWJ#:[id number]  Hem/Onc follow up   Subjective: Pt started on Revlimid today, tolerated the first dose well (he does not remember taking it this morning, but was given per [facility]). He did very well with rehab yesterday, but probably overdid it, and he has more back spasm today, no other new complains.   Objective:  Vitals:   09/13/18 1959 09/14/18 0513  BP: 109/70 (!) 130/98  Pulse: 97 88  Resp: 15   Temp: 97.8 F (36.6 C) 97.7 F (36.5 C)  SpO2: 100% 97%    Body mass index is 35.53 kg/m.  Intake/Output Summary (Last 24 hours) at 09/14/2018 2059 Last data filed at 09/14/2018 1400 Gross per 24 hour  Intake 460 ml  Output 1500 ml  Net -1040 ml     Sclerae unicteric  Oropharynx clear  No peripheral adenopathy  Lungs clear -- no rales or rhonchi  Heart regular rate and rhythm  Abdomen benign    CBG (last 3)  Recent Labs    09/14/18 0625 09/14/18 1218 09/14/18 1723  GLUCAP 121* 107* 107*    Urine Studies No results for input(s): UHGB, CRYS in the last 72 hours.  Invalid input(s): UACOL, UAPR, USPG, UPH, UTP, UGL, UKET, UBIL, UNIT, UROB, [place], UEPI, UWBC, Duwayne Heck [place], [name]  Basic Metabolic Panel: Recent Labs  Lab 09/08/18 0726 09/09/18 0735 09/09/18 1821 09/10/18 0703 09/12/18 0419  NA 127* 130* 130* 129* 134*  K 3.2* 3.3* 3.5 3.7 3.5  CL 95* 99 102 103 107  CO2 20* 20* 19* 20* 20*  GLUCOSE 113* 108* 104* 94 106*  BUN _0 CREATININE 1.28* 1.34* 1.17 1.09 1.02  CALCIUM 7.9* 7.4* 7.3* 7.4* 7.5*   GFR Estimated Creatinine Clearance: 135.6 mL/min (by C-G formula based on SCr of 1.02 mg/dL). Liver Function Tests: Recent Labs  Lab 09/08/18 0726 09/09/18 0735 09/09/18 1821  AST _1 ALT _2 ALKPHOS 48 45 45  BILITOT 0.6 0.5 0.6  PROT 11.2* 10.9* 11.3*  ALBUMIN 2.3* 2.0* 2.1*   No results for input(s): LIPASE, AMYLASE in the last 168 hours. No results for input(s):  AMMONIA in the last 168 hours. Coagulation profile No results for input(s): INR, PROTIME in the last 168 hours.  CBC: Recent Labs  Lab 09/08/18 1525 09/09/18 0735 09/09/18 1821 09/10/18 0703 09/12/18 0419  WBC 2.8* 3.1* 3.3* 4.8 4.1  NEUTROABS 1.9  --  1.5* 2.6 1.7  HGB 6.9* 6.3* 7.6* 7.7* 7.8*  HCT 20.3* 18.6* 22.7* 22.3* 23.2*  MCV 87.1 89.0 89.0 88.1 89.6  PLT 169 165 171 195 222   Cardiac Enzymes: No results for input(s): CKTOTAL, CKMB, CKMBINDEX, TROPONINI in the last 168 hours. BNP: Invalid input(s): POCBNP CBG: Recent Labs  Lab 09/13/18 1736 09/13/18 2114 09/14/18 0625 09/14/18 1218 09/14/18 1723  GLUCAP 136* 172* 121* 107* 107*   D-Dimer No results for input(s): DDIMER in the last 72 hours. Hgb A1c No results for input(s): HGBA1C in the last 72 hours. Lipid Profile No results for input(s): CHOL, HDL, LDLCALC, TRIG, CHOLHDL, LDLDIRECT in the last 72 hours. Thyroid function studies No results for input(s): TSH, T4TOTAL, T3FREE, THYROIDAB in the last 72 hours.  Invalid input(s): FREET3 Anemia work up No results for input(s): VITAMINB12, FOLATE, FERRITIN, TIBC, IRON, RETICCTPCT in the last 72 hours. Microbiology Recent Results (from the past 240 hour(s))  Culture, blood (single) w Reflex to  ID Panel     Status: None   Collection Time: 09/08/18  8:45 AM  Result Value Ref Range Status   Specimen Description BLOOD RIGHT ANTECUBITAL  Final   Special Requests   Final    BOTTLES DRAWN AEROBIC AND ANAEROBIC Blood Culture adequate volume   Culture   Final    NO GROWTH 5 DAYS Performed at [facility], [address] [place], [place] [address]    Report Status 09/13/2018 FINAL  Final  Culture, Urine     Status: Abnormal   Collection Time: 09/08/18  1:42 PM  Result Value Ref Range Status   Specimen Description URINE, CLEAN CATCH  Final   Special Requests NONE  Final   Culture (A)  Final    <10,000 COLONIES/mL INSIGNIFICANT GROWTH Performed at [facility], [address] [place], [place] [address]    Report Status 09/09/2018 FINAL  Final      Studies:  No results found.  Assessment: 43 y.o.  African-American male, without past medical history, presented with debilitating low back pain,and moderate anemia.  1.Multiple myeloma, IgG Kappa, stage III 2.Moderate anemia, worsening after chemo, 1u PRBC on 5/17 3. Type 2 diabetes  4. Severe low back pain, from thoracic and lumbar compression fracture, lumbar spine stenosis  5. Neutropenia and fever, resolved now  6. Hyponatremia  7. Hypocalcemia    Plan:  -he is doing well with rehab, especially yesterday, possible related to large dose dexa the day before.  -I encourage him to take flexeril as needed before PT -will add calcium and vitD, level low probably related to zometa infusion  -continue revlimid 25mg daily for 14 days -I will order lab for next Monday, plan to f/u on Tuesday. Please call me if there is any questions.    Yan Feng, MD 09/14/2018    

## 2018-09-14 NOTE — Progress Notes (Signed)
Occupational Therapy Session Note  Patient Details  Name: Edward Todd MRN: [id number] Date of Birth: Sep 12, 1975  Today's Date: 09/14/2018 OT Individual Time: 0941-7919 OT Individual Time Calculation (min): 10 min  and Today's Date: 09/14/2018 OT Missed Time: 35 Minutes Missed Time Reason: Pain   Short Term Goals: Week 1:  OT Short Term Goal 1 (Week 1): Patient will complete bed mobility and functional transfers with mod A OT Short Term Goal 2 (Week 1): patient will tolerate OOB for 1 hour at a time and full therapy schedule OT Short Term Goal 3 (Week 1): assess visual complaints and follow up as appropriate OT Short Term Goal 4 (Week 1): Patient will complete UB bathing and dressing with min A, LB bathing and dressing with mod A  Skilled Therapeutic Interventions/Progress Updates:    Pt greeted semi-reclined in bed with R LE off of the bed. Pt stated he has been in 10/10 back pain all day with intense muscle spasms with any movement. Pt stated he just got into a position in bed with some relief. Pt discussed progress with therapist and how he feels today is a major set-back after doing so well yesterday. OT utilized therapeutic use of self to provide encouragement, pt appreciative. Pt unable to participate further in session 2/2 pain. Pt left with needs met and call bell in reach.   Therapy Documentation Precautions:  Precautions Precautions: Fall, Back Precaution Comments:   Restrictions Weight Bearing Restrictions: No Other Position/Activity Restrictions: T3, T6 - T 10 path Fx General: General OT Amount of Missed Time: 35 Minutes Pain: Pain Assessment Pain Score: 10-Worst pain ever Pain Type: Acute pain;Neuropathic pain;Intractable pain Pain Location: Back Pain Orientation: Lower;Left;Right Pain Descriptors / Indicators: Sharp;Spasm Pain Onset: On-going Pain Intervention(s): Heat applied;Repositioned;Rest   Therapy/Group: Individual Therapy  Valma Cava 09/14/2018, 2:00 PM

## 2018-09-14 NOTE — Progress Notes (Signed)
[place] PHYSICAL MEDICINE & REHABILITATION PROGRESS NOTE  Subjective/Complaints: Patient seen laying in bed this AM.  He states he slept well overnight.  Per nursing, hiccups yesterday.  He notes some back spasms yesterday that improved with meds.     ROS: Denies CP, shortness of breath, nausea, vomiting, diarrhea.  Objective: Vital Signs: Blood pressure (!) 130/98, pulse 88, temperature 97.7 F (36.5 C), temperature source Oral, resp. rate 15, height 6' 3.51" (1.918 m), weight 130.7 kg, SpO2 97 %. No results found. Recent Labs    09/12/18 0419  WBC 4.1  HGB 7.8*  HCT 23.2*  PLT 222   Recent Labs    09/12/18 0419  NA 134*  K 3.5  CL 107  CO2 20*  GLUCOSE 106*  BUN 12  CREATININE 1.02  CALCIUM 7.5*    Physical Exam: BP (!) 130/98 (BP Location: Left Arm)   Pulse 88   Temp 97.7 F (36.5 C) (Oral)   Resp 15   Ht 6' 3.51" (1.918 m)   Wt 130.7 kg   SpO2 97%   BMI 35.53 kg/m  Constitutional: No distress . Vital signs reviewed. HENT: Normocephalic. Atraumatic. Eyes: EOMI. No discharge. Cardiovascular: No JVD. Respiratory: Normal effort. GI: Non-distended. Musc: No edema or tenderness in extremities, stable Neurological: Alert and oriented Good awareness of deficits.  Motor: 4/5 throughout (pain inhibition) Skin: Skin iswarmand dry.  Psychiatric: He has anormal mood and affect. Hisbehavior is normal.Judgmentand thought contentnormal.   Assessment/Plan: 1. Functional deficits secondary to multiple myeloma which require 3+ hours per day of interdisciplinary therapy in a comprehensive inpatient rehab setting.  Physiatrist is providing close team supervision and 24 hour management of active medical problems listed below.  Physiatrist and rehab team continue to assess barriers to discharge/monitor patient progress toward functional and medical goals  Care Tool:  Bathing  Bathing activity did not occur: Safety/medical concerns Body parts bathed by  patient: Right arm, Left arm, Abdomen, Chest, Front perineal area, Buttocks, Right upper leg, Left upper leg, Right lower leg, Left lower leg, Face         Bathing assist Assist Level: Minimal Assistance - Patient > 75%     Upper Body Dressing/Undressing Upper body dressing Upper body dressing/undressing activity did not occur (including orthotics): Safety/medical concerns What is the patient wearing?: Pull over shirt    Upper body assist Assist Level: Supervision/Verbal cueing    Lower Body Dressing/Undressing Lower body dressing    Lower body dressing activity did not occur: Safety/medical concerns What is the patient wearing?: Underwear/pull up, Pants     Lower body assist Assist for lower body dressing: Minimal Assistance - Patient > 75%(reacher)     Toileting Toileting    Toileting assist Assist for toileting: Minimal Assistance - Patient > 75% Assistive Device Comment: of urinal per RN report    Transfers Chair/bed transfer  Transfers assist  Chair/bed transfer activity did not occur: Safety/medical concerns  Chair/bed transfer assist level: Contact Guard/Touching assist     Locomotion Ambulation   Ambulation assist   Ambulation activity did not occur: Safety/medical concerns  Assist level: Contact Guard/Touching assist Assistive device: Walker-rolling Max distance: 130'   Walk 10 feet activity   Assist  Walk 10 feet activity did not occur: Safety/medical concerns  Assist level: Supervision/Verbal cueing Assistive device: Walker-rolling   Walk 50 feet activity   Assist Walk 50 feet with 2 turns activity did not occur: Safety/medical concerns  Assist level: Contact Guard/Touching assist  Walk 150 feet activity   Assist Walk 150 feet activity did not occur: Safety/medical concerns         Walk 10 feet on uneven surface  activity   Assist Walk 10 feet on uneven surfaces activity did not occur: Safety/medical concerns          Wheelchair     Assist Will patient use wheelchair at discharge?: Yes Type of Wheelchair: Manual Wheelchair activity did not occur: Safety/medical concerns(low hemoglobin/fatigue and pain/back spasms)  Wheelchair assist level: Supervision/Verbal cueing Max wheelchair distance: 100'    Wheelchair 50 feet with 2 turns activity    Assist    Wheelchair 50 feet with 2 turns activity did not occur: Safety/medical concerns   Assist Level: Supervision/Verbal cueing   Wheelchair 150 feet activity     Assist Wheelchair 150 feet activity did not occur: Safety/medical concerns          Medical Problem List and Plan: 1.Decreased functional mobilitysecondary to newly diagnosed multiple myeloma.  Continue CIR  Weekly Velcade and dexamethasone initiated 09/05/2018 per Heme/Onc 2. Antithrombotics: -DVT/anticoagulation:Subcutaneous Lovenox -antiplatelet therapy: Aspirin 81 mg daily 3. Pain Management:Oxycodone as needed -scheduled flexeril TID -kpad prn for spasms 4. Mood:Provide emotional support -antipsychotic agents: N/A 5. Neuropsych: This patientiscapable of making decisions on hisown behalf. 6. Skin/Wound Care:Routine skin checks 7. Fluids/Electrolytes/Nutrition:Routine in and outs   Remeron started 5/18- eating 100% of meals now 8.Acute on chronic anemia. Follow-up hematology oncology services  Hemoglobin 7.8 on 5/20 transfusion on 5/17  Hemoccult negative  Recs per Heme/Onc 9. Diabetes mellitus. Hemoglobin A1c 9.4. Provide education  Metformin 500 twice daily, increased to 1000 twice daily on 5/21  Labile on 5/22 10. Morbid obesity. BMI 35.53. Dietary follow-up 11. Constipation. Laxative assistance 12.  Hyponatremia  Sodium 134 on 5/20  Continue to monitor 13.  Fevers: Resolved  Completed 5-day course of IV Levaquin  14.  Sleep disturbance  Likely exacerbated by steroids  No benefit  with Ambien  Remeron started on 5/18  Improved 15.  Elevated diastolic blood pressure  Continue to monitor  Labile on 5/22  LOS: 7 days A FACE TO FACE EVALUATION WAS PERFORMED  Erynn Vaca [name] 09/14/2018, 9:50 AM

## 2018-09-14 NOTE — Progress Notes (Signed)
Medicated with prn oxycodone x2 and zofran this morning for nausea. Pt slept well throughout the night.

## 2018-09-15 ENCOUNTER — Inpatient Hospital Stay (HOSPITAL_COMMUNITY): Payer: 59 | Admitting: Physical Therapy

## 2018-09-15 LAB — GLUCOSE, CAPILLARY
Glucose-Capillary: 108 mg/dL — ABNORMAL HIGH (ref 70–99)
Glucose-Capillary: 115 mg/dL — ABNORMAL HIGH (ref 70–99)
Glucose-Capillary: 90 mg/dL (ref 70–99)
Glucose-Capillary: 100 mg/dL — ABNORMAL HIGH (ref 70–99)

## 2018-09-15 NOTE — Progress Notes (Signed)
Physical Therapy Session Note  Patient Details  Name: Edward Todd MRN: [id number] Date of Birth: 1976-04-14  Today's Date: 09/15/2018 PT Individual Time: 0850-0920 PT Individual Time Calculation (min): 30 min   Short Term Goals: Week 1:  PT Short Term Goal 1 (Week 1): Pt will perform bed mobility with min assist PT Short Term Goal 2 (Week 1): Pt will perform sit<>stand with mod assist PT Short Term Goal 3 (Week 1): Pt will initiate transfer training from bed<>w/c PT Short Term Goal 4 (Week 1): Pt will tolerate >30 min of OOB activity to participate in therapy   Skilled Therapeutic Interventions/Progress Updates:   Pt in supine and agreeable to therapy, states pain remains 10/10 in back but is premedicated and agreeable to attempt activity. Attempted supine to sit multiple times, ongoing education and encouragement to engage ab/core musculature during transitional movements. Verbal/visual reminders of log-roll technique, pt continues to refuse any physical assistance w/ bed mobility. Pt continued to state he wanted to get up, despite pain being a major limiting factor today, but felt like he needed to take his time although he had been attempting for almost 30 minutes w/ this therapist. Agreeable to call RN once he feels like he is ready to get up. Ended session in supine, all needs in reach.   Therapy Documentation Precautions:  Precautions Precautions: Fall, Back Precaution Comments:   Restrictions Weight Bearing Restrictions: Yes Other Position/Activity Restrictions: T3, T6 - T 10 path Fx  Therapy/Group: Individual Therapy  [name] [name] 09/15/2018, 9:31 AM

## 2018-09-15 NOTE — Progress Notes (Signed)
[place] PHYSICAL MEDICINE & REHABILITATION PROGRESS NOTE  Subjective/Complaints: We discussed his back spasms occur mainly with therapy.  We discussed timing of medications so he takes it before he goes to therapy  ROS: Denies CP, shortness of breath, nausea, vomiting, diarrhea.  Objective: Vital Signs: Blood pressure 112/85, pulse 83, temperature 98.4 F (36.9 C), temperature source Oral, resp. rate 16, height 6' 3.51" (1.918 m), weight 130.7 kg, SpO2 97 %. No results found. No results for input(s): WBC, HGB, HCT, PLT in the last 72 hours. No results for input(s): NA, K, CL, CO2, GLUCOSE, BUN, CREATININE, CALCIUM in the last 72 hours.  Physical Exam: BP 112/85 (BP Location: Left Arm)   Pulse 83   Temp 98.4 F (36.9 C) (Oral)   Resp 16   Ht 6' 3.51" (1.918 m)   Wt 130.7 kg   SpO2 97%   BMI 35.53 kg/m  Constitutional: No distress . Vital signs reviewed. HENT: Normocephalic. Atraumatic. Eyes: EOMI. No discharge. Cardiovascular: No JVD. Respiratory: Normal effort. GI: Non-distended. Musc: No edema or tenderness in extremities, stable Neurological: Alert and oriented Good awareness of deficits.  Motor: 4/5 throughout (pain inhibition) Skin: Skin iswarmand dry.  Psychiatric: He has anormal mood and affect. Hisbehavior is normal.Judgmentand thought contentnormal.   Assessment/Plan: 1. Functional deficits secondary to multiple myeloma which require 3+ hours per day of interdisciplinary therapy in a comprehensive inpatient rehab setting.  Physiatrist is providing close team supervision and 24 hour management of active medical problems listed below.  Physiatrist and rehab team continue to assess barriers to discharge/monitor patient progress toward functional and medical goals  Care Tool:  Bathing  Bathing activity did not occur: Safety/medical concerns Body parts bathed by patient: Right arm, Left arm, Abdomen, Chest, Front perineal area, Buttocks, Right upper  leg, Left upper leg, Right lower leg, Left lower leg, Face         Bathing assist Assist Level: Minimal Assistance - Patient > 75%     Upper Body Dressing/Undressing Upper body dressing Upper body dressing/undressing activity did not occur (including orthotics): Safety/medical concerns What is the patient wearing?: Pull over shirt    Upper body assist Assist Level: Supervision/Verbal cueing    Lower Body Dressing/Undressing Lower body dressing    Lower body dressing activity did not occur: Safety/medical concerns What is the patient wearing?: Underwear/pull up, Pants     Lower body assist Assist for lower body dressing: Minimal Assistance - Patient > 75%(reacher)     Toileting Toileting    Toileting assist Assist for toileting: Minimal Assistance - Patient > 75% Assistive Device Comment: of urinal per RN report    Transfers Chair/bed transfer  Transfers assist  Chair/bed transfer activity did not occur: Safety/medical concerns  Chair/bed transfer assist level: Contact Guard/Touching assist     Locomotion Ambulation   Ambulation assist   Ambulation activity did not occur: Safety/medical concerns  Assist level: Contact Guard/Touching assist Assistive device: Walker-rolling Max distance: 130'   Walk 10 feet activity   Assist  Walk 10 feet activity did not occur: Safety/medical concerns  Assist level: Supervision/Verbal cueing Assistive device: Walker-rolling   Walk 50 feet activity   Assist Walk 50 feet with 2 turns activity did not occur: Safety/medical concerns  Assist level: Contact Guard/Touching assist      Walk 150 feet activity   Assist Walk 150 feet activity did not occur: Safety/medical concerns         Walk 10 feet on uneven surface  activity  Assist Walk 10 feet on uneven surfaces activity did not occur: Safety/medical concerns         Wheelchair     Assist Will patient use wheelchair at discharge?: Yes Type of  Wheelchair: Manual Wheelchair activity did not occur: Safety/medical concerns(low hemoglobin/fatigue and pain/back spasms)  Wheelchair assist level: Supervision/Verbal cueing Max wheelchair distance: 100'    Wheelchair 50 feet with 2 turns activity    Assist    Wheelchair 50 feet with 2 turns activity did not occur: Safety/medical concerns   Assist Level: Supervision/Verbal cueing   Wheelchair 150 feet activity     Assist Wheelchair 150 feet activity did not occur: Safety/medical concerns          Medical Problem List and Plan: 1.Decreased functional mobilitysecondary to newly diagnosed multiple myeloma.  Continue CIR  Weekly Velcade and dexamethasone initiated 09/05/2018 per Heme/Onc 2. Antithrombotics: -DVT/anticoagulation:Subcutaneous Lovenox -antiplatelet therapy: Aspirin 81 mg daily 3. Pain Management:Oxycodone as needed -scheduled flexeril TID -kpad prn for spasms 4. Mood:Provide emotional support -antipsychotic agents: N/A 5. Neuropsych: This patientiscapable of making decisions on hisown behalf. 6. Skin/Wound Care:Routine skin checks 7. Fluids/Electrolytes/Nutrition:Routine in and outs   Remeron started 5/18- eating 100% of meals now 8.Acute on chronic anemia. Follow-up hematology oncology services  Hemoglobin 7.8 on 5/20 transfusion on 5/17  Hemoccult negative  Recs per Heme/Onc 9. Diabetes mellitus. Hemoglobin A1c 9.4. Provide education  Metformin 500 twice daily, increased to 1000 twice daily on 5/21  Labile on 5/22 10. Morbid obesity. BMI 35.53. Dietary follow-up 11. Constipation. Laxative assistance 12.  Hyponatremia  Sodium 134 on 5/20  Continue to monitor 13.  Fevers: Resolved  Completed 5-day course of IV Levaquin  14.  Sleep disturbance  Likely exacerbated by steroids  No benefit with Ambien  Remeron started on 5/18  Improved 15.  Elevated diastolic blood  pressure  Continue to monitor  Labile on 5/22  LOS: 8 days A FACE TO FACE EVALUATION WAS PERFORMED  Charlett Blake 09/15/2018, 9:40 AM

## 2018-09-15 NOTE — Plan of Care (Signed)
  Problem: RH BOWEL ELIMINATION Goal: RH STG MANAGE BOWEL W/MEDICATION W/ASSISTANCE Description STG Manage Bowel with Medication Min with Assistance.  Outcome: Progressing   Problem: RH SKIN INTEGRITY Goal: RH STG SKIN FREE OF INFECTION/BREAKDOWN Description Patient free from skin breakdown during admission  Outcome: Progressing Goal: RH STG MAINTAIN SKIN INTEGRITY WITH ASSISTANCE Description STG Maintain Skin Integrity With [place].  Outcome: Progressing   Problem: RH SAFETY Goal: RH STG ADHERE TO SAFETY PRECAUTIONS W/ASSISTANCE/DEVICE Description STG Adhere to Safety Precautions With Min Assistance/Device.  Outcome: Progressing   Problem: RH PAIN MANAGEMENT Goal: RH STG PAIN MANAGED AT OR BELOW PT'S PAIN GOAL Description Pain free or pain less than 3 during admission  Outcome: Progressing

## 2018-09-16 ENCOUNTER — Inpatient Hospital Stay (HOSPITAL_COMMUNITY): Payer: 59 | Admitting: Physical Therapy

## 2018-09-16 LAB — GLUCOSE, CAPILLARY
Glucose-Capillary: 92 mg/dL (ref 70–99)
Glucose-Capillary: 118 mg/dL — ABNORMAL HIGH (ref 70–99)
Glucose-Capillary: 95 mg/dL (ref 70–99)
Glucose-Capillary: 122 mg/dL — ABNORMAL HIGH (ref 70–99)

## 2018-09-16 MED ORDER — OXYCODONE HCL 5 MG PO TABS
5.0000 mg | ORAL_TABLET | Freq: Four times a day (QID) | ORAL | Status: DC | PRN
  Administered 2018-09-17 (×2): 5 mg via ORAL
  Filled 2018-09-16 (×2): qty 1

## 2018-09-17 ENCOUNTER — Inpatient Hospital Stay (HOSPITAL_COMMUNITY): Payer: 59 | Admitting: Physical Therapy

## 2018-09-17 ENCOUNTER — Inpatient Hospital Stay (HOSPITAL_COMMUNITY): Payer: 59 | Admitting: Occupational Therapy

## 2018-09-17 DIAGNOSIS — M62838 Other muscle spasm: Secondary | ICD-10-CM

## 2018-09-17 LAB — CBC WITH DIFFERENTIAL/PLATELET
Abs Immature Granulocytes: 0.04 10*3/uL (ref 0.00–0.07)
Basophils Absolute: 0 10*3/uL (ref 0.0–0.1)
Basophils Relative: 0 %
Eosinophils Absolute: 0.1 10*3/uL (ref 0.0–0.5)
Eosinophils Relative: 1 %
HCT: 23.8 % — ABNORMAL LOW (ref 39.0–52.0)
Hemoglobin: 7.9 g/dL — ABNORMAL LOW (ref 13.0–17.0)
Immature Granulocytes: 1 %
Lymphocytes Relative: 34 %
Lymphs Abs: 1.5 10*3/uL (ref 0.7–4.0)
MCH: 30 pg (ref 26.0–34.0)
MCHC: 33.2 g/dL (ref 30.0–36.0)
MCV: 90.5 fL (ref 80.0–100.0)
Monocytes Absolute: 0.4 10*3/uL (ref 0.1–1.0)
Monocytes Relative: 9 %
Neutro Abs: 2.4 10*3/uL (ref 1.7–7.7)
Neutrophils Relative %: 55 %
Platelets: 326 10*3/uL (ref 150–400)
RBC: 2.63 MIL/uL — ABNORMAL LOW (ref 4.22–5.81)
RDW: 14.6 % (ref 11.5–15.5)
WBC: 4.4 10*3/uL (ref 4.0–10.5)
nRBC: 0 % (ref 0.0–0.2)

## 2018-09-17 LAB — GLUCOSE, CAPILLARY
Glucose-Capillary: 85 mg/dL (ref 70–99)
Glucose-Capillary: 104 mg/dL — ABNORMAL HIGH (ref 70–99)
Glucose-Capillary: 124 mg/dL — ABNORMAL HIGH (ref 70–99)
Glucose-Capillary: 92 mg/dL (ref 70–99)

## 2018-09-17 LAB — BASIC METABOLIC PANEL
Anion gap: 7 (ref 5–15)
BUN: 20 mg/dL (ref 6–20)
CO2: 25 mmol/L (ref 22–32)
Calcium: 8.6 mg/dL — ABNORMAL LOW (ref 8.9–10.3)
Chloride: 99 mmol/L (ref 98–111)
Creatinine, Ser: 1.19 mg/dL (ref 0.61–1.24)
GFR calc Af Amer: >60 mL/min (ref >60)
GFR calc non Af Amer: >60 mL/min (ref >60)
Glucose, Bld: 106 mg/dL — ABNORMAL HIGH (ref 70–99)
Potassium: 4.1 mmol/L (ref 3.5–5.1)
Sodium: 131 mmol/L — ABNORMAL LOW (ref 135–145)

## 2018-09-17 MED ORDER — OXYCODONE HCL 5 MG PO TABS
10.0000 mg | ORAL_TABLET | Freq: Four times a day (QID) | ORAL | Status: DC | PRN
  Administered 2018-09-17 – 2018-09-25 (×26): 10 mg via ORAL
  Filled 2018-09-17 (×26): qty 2

## 2018-09-17 MED ORDER — METHOCARBAMOL 750 MG PO TABS
750.0000 mg | ORAL_TABLET | Freq: Three times a day (TID) | ORAL | Status: DC
  Administered 2018-09-17 – 2018-09-25 (×24): 750 mg via ORAL
  Filled 2018-09-17 (×24): qty 1

## 2018-09-17 NOTE — Progress Notes (Signed)
[place] PHYSICAL MEDICINE & REHABILITATION PROGRESS NOTE  Subjective/Complaints: Patient seen laying in bed this morning.  He states he slept fairly overnight and had a fair weekend due to back spasms.  Discussed with spasms with nursing,?  Behavioral component.  ROS: + Back spasms.  Denies CP, shortness of breath, nausea, vomiting, diarrhea.  Objective: Vital Signs: Blood pressure 127/88, pulse 87, temperature 98.5 F (36.9 C), temperature source Oral, resp. rate 16, height 6' 3.51" (1.918 m), weight 130.7 kg, SpO2 98 %. No results found. Recent Labs    09/17/18 0330  WBC 4.4  HGB 7.9*  HCT 23.8*  PLT 326   Recent Labs    09/17/18 0330  NA 131*  K 4.1  CL 99  CO2 25  GLUCOSE 106*  BUN 20  CREATININE 1.19  CALCIUM 8.6*    Physical Exam: BP 127/88 (BP Location: Left Arm)   Pulse 87   Temp 98.5 F (36.9 C) (Oral)   Resp 16   Ht 6' 3.51" (1.918 m)   Wt 130.7 kg   SpO2 98%   BMI 35.53 kg/m  Constitutional: No distress . Vital signs reviewed. HENT: Normocephalic.  Atraumatic Eyes: EOMI. No discharge. Cardiovascular: No JVD. Respiratory: Normal effort. GI: Non-distended. Musc: No edema or tenderness in extremities, unchanged Neurological: Alert and oriented Good awareness of deficits.  Motor: 4/5 throughout (pain inhibition), stable Skin: Skin iswarmand dry.  Psychiatric: He has anormal mood and affect. Hisbehavior is normal.Judgmentand thought contentnormal.   Assessment/Plan: 1. Functional deficits secondary to multiple myeloma which require 3+ hours per day of interdisciplinary therapy in a comprehensive inpatient rehab setting.  Physiatrist is providing close team supervision and 24 hour management of active medical problems listed below.  Physiatrist and rehab team continue to assess barriers to discharge/monitor patient progress toward functional and medical goals  Care Tool:  Bathing  Bathing activity did not occur: Safety/medical  concerns Body parts bathed by patient: Right arm, Left arm, Abdomen, Chest, Front perineal area, Buttocks, Right upper leg, Left upper leg, Right lower leg, Left lower leg, Face         Bathing assist Assist Level: Minimal Assistance - Patient > 75%     Upper Body Dressing/Undressing Upper body dressing Upper body dressing/undressing activity did not occur (including orthotics): Safety/medical concerns What is the patient wearing?: Pull over shirt    Upper body assist Assist Level: Supervision/Verbal cueing    Lower Body Dressing/Undressing Lower body dressing    Lower body dressing activity did not occur: Safety/medical concerns What is the patient wearing?: Underwear/pull up, Pants     Lower body assist Assist for lower body dressing: Minimal Assistance - Patient > 75%(reacher)     Toileting Toileting    Toileting assist Assist for toileting: Minimal Assistance - Patient > 75% Assistive Device Comment: of urinal per RN report    Transfers Chair/bed transfer  Transfers assist  Chair/bed transfer activity did not occur: Safety/medical concerns  Chair/bed transfer assist level: Contact Guard/Touching assist     Locomotion Ambulation   Ambulation assist   Ambulation activity did not occur: Safety/medical concerns  Assist level: Contact Guard/Touching assist Assistive device: Walker-rolling Max distance: 130'   Walk 10 feet activity   Assist  Walk 10 feet activity did not occur: Safety/medical concerns  Assist level: Supervision/Verbal cueing Assistive device: Walker-rolling   Walk 50 feet activity   Assist Walk 50 feet with 2 turns activity did not occur: Safety/medical concerns  Assist level: Contact Guard/Touching assist  Walk 150 feet activity   Assist Walk 150 feet activity did not occur: Safety/medical concerns         Walk 10 feet on uneven surface  activity   Assist Walk 10 feet on uneven surfaces activity did not occur:  Safety/medical concerns         Wheelchair     Assist Will patient use wheelchair at discharge?: Yes Type of Wheelchair: Manual Wheelchair activity did not occur: Safety/medical concerns(low hemoglobin/fatigue and pain/back spasms)  Wheelchair assist level: Supervision/Verbal cueing Max wheelchair distance: 100'    Wheelchair 50 feet with 2 turns activity    Assist    Wheelchair 50 feet with 2 turns activity did not occur: Safety/medical concerns   Assist Level: Supervision/Verbal cueing   Wheelchair 150 feet activity     Assist Wheelchair 150 feet activity did not occur: Safety/medical concerns          Medical Problem List and Plan: 1.Decreased functional mobilitysecondary to newly diagnosed multiple myeloma.  Continue CIR  Weekly Velcade and dexamethasone initiated 09/05/2018 per Heme/Onc  Weekend notes reviewed- back pain 2. Antithrombotics: -DVT/anticoagulation:Subcutaneous Lovenox -antiplatelet therapy: Aspirin 81 mg daily 3. Pain Management:Oxycodone as needed -scheduled flexeril TID, DC'd on 5/25  Robaxin-750 3 times daily scheduled on 5/25 -kpad prn for spasms 4. Mood:Provide emotional support -antipsychotic agents: N/A 5. Neuropsych: This patientiscapable of making decisions on hisown behalf. 6. Skin/Wound Care:Routine skin checks 7. Fluids/Electrolytes/Nutrition:Routine in and outs   Remeron started 5/18- improved appetite 8.Acute on chronic anemia. Follow-up hematology oncology services  Hemoglobin 7.9 on 5/25 after transfusion on 5/17  Hemoccult negative  Recs per Heme/Onc 9. Diabetes mellitus. Hemoglobin A1c 9.4. Provide education  Metformin 500 twice daily, increased to 1000 twice daily on 5/21  Relatively controlled on 5/25 10. Morbid obesity. BMI 35.53. Dietary follow-up 11. Constipation. Laxative assistance 12.  Hyponatremia  Sodium 131 on 5/25  Continue to  monitor 13.  Fevers: Resolved  Completed 5-day course of IV Levaquin  14.  Sleep disturbance  Likely exacerbated by steroids  No benefit with Ambien  Remeron started on 5/18  Improved 15.  Elevated diastolic blood pressure  Continue to monitor  Labile on 5/22  LOS: 10 days A FACE TO FACE EVALUATION WAS PERFORMED  Rukia Mcgillivray [name] 09/17/2018, 10:16 AM

## 2018-09-17 NOTE — Progress Notes (Signed)
Patient slept well towards the latter part of the shift. Received oxycodone 5mg  x2 and tylenol x1. Pt noted resting quietly at present time. Will continue to monitor.

## 2018-09-17 NOTE — Plan of Care (Signed)
  Problem: RH BOWEL ELIMINATION Goal: RH STG MANAGE BOWEL W/MEDICATION W/ASSISTANCE Description STG Manage Bowel with Medication Min with Assistance.  Outcome: Progressing   Problem: RH SKIN INTEGRITY Goal: RH STG SKIN FREE OF INFECTION/BREAKDOWN Description Patient free from skin breakdown during admission  Outcome: Progressing Goal: RH STG MAINTAIN SKIN INTEGRITY WITH ASSISTANCE Description STG Maintain Skin Integrity With [place].  Outcome: Progressing   Problem: RH SAFETY Goal: RH STG ADHERE TO SAFETY PRECAUTIONS W/ASSISTANCE/DEVICE Description STG Adhere to Safety Precautions With Min Assistance/Device.  Outcome: Progressing   Problem: RH PAIN MANAGEMENT Goal: RH STG PAIN MANAGED AT OR BELOW PT'S PAIN GOAL Description Pain free or pain less than 3 during admission  Outcome: Progressing

## 2018-09-17 NOTE — Progress Notes (Signed)
Patient noted with reports of constant throbbing back pain. Pt noted to take approximately 90 minutes toileting due to reported back pain. Pt given prn oxycodone 5mg  at 1948 and tylenol at 2220, minimal effects noted. Dr. Letta Pate notified of patient's condition and changed oxycodone frequency from 8 hours prn to 6 hours prn.

## 2018-09-17 NOTE — Progress Notes (Signed)
Physical Therapy Weekly Progress Note  Patient Details  Name: Edward Todd MRN: [id number] Date of Birth: 1976/02/08  Beginning of progress report period: October 10, 2018 End of progress report period: Sep 17, 2018  Today's Date: 09/17/2018 PT Individual Time: 9147-8295 PT Individual Time Calculation (min): 30 min  and Today's Date: 09/17/2018 PT Missed Time: 30 Minutes Missed Time Reason: Pain  Patient has met 4 of 4 short term goals. Pt is making good progress towards LTGs, however remains limited by primarily by pain and also fatigue. He is performing most mobility w/ CGA, but requires a significant amount of time to complete any functional task 2/2 pain and fear of pain. For example, >30 minutes to transfer form supine to sitting EOB. He relies heavily on UE support on RW or bedrails, but this is steadily improving as he is challenging himself to decrease UE reliance w/ up to min assist for dynamic balance tasks. He is consistently ambulating household distances w/ increased time as well.  Patient continues to demonstrate the following deficits muscle weakness and muscle joint tightness, decreased cardiorespiratoy endurance and decreased standing balance and decreased balance strategies and therefore will continue to benefit from skilled PT intervention to increase functional independence with mobility.  Patient progressing toward long term goals..  Continue plan of care.  PT Short Term Goals Week 1:  PT Short Term Goal 1 (Week 1): Pt will perform bed mobility with min assist PT Short Term Goal 1 - Progress (Week 1): Met PT Short Term Goal 2 (Week 1): Pt will perform sit<>stand with mod assist PT Short Term Goal 2 - Progress (Week 1): Met PT Short Term Goal 3 (Week 1): Pt will initiate transfer training from bed<>w/c PT Short Term Goal 3 - Progress (Week 1): Met PT Short Term Goal 4 (Week 1): Pt will tolerate >30 min of OOB activity to participate in therapy  PT Short Term Goal 4 -  Progress (Week 1): Met Week 2:  PT Short Term Goal 1 (Week 2): Pt will initiate step/stair training PT Short Term Goal 2 (Week 2): Pt will transfer supine>sit and stand pivot transfers within 5 minutes, 75% of the time PT Short Term Goal 3 (Week 2): Pt will participate in 60 min of OOB activity w/o increase in fatigue   Skilled Therapeutic Interventions/Progress Updates:   Pt in propped sitting in bed upon arrival, agreeable to therapy but pain 10/10 in back. Pt premedicated and w/ heat already on back. Pt required 10 minutes to move from position to sitting EOB w/o assist from therapist. Pt continuing to have debilitating pain. Pt unable to take BUEs off EOB in seated to take a sip of water 2/2 pain. Discussed w/ PA regarding pain management techniques. Pt already maxed out on pain medication at this time, using heat intermittently, and taking muscle relaxers. Educated pt on other pain relief strategies including positioning to relieve back pressure, use of essential oils, and deep breathing techniques. Pt appreciative of education and in agreement to try anything that might help. Pt requesting to return to sidelying, did so w/ supervision. Ended session in R sidelying, all needs in reach. Missed 30 min of skilled PT 2/2 pain.   Therapy Documentation Precautions:  Precautions Precautions: Fall, Back Precaution Comments:   Restrictions Weight Bearing Restrictions: Yes Other Position/Activity Restrictions: T3, T6 - T 10 path Fx Vital Signs: Therapy Vitals Temp: 98.5 F (36.9 C) Temp Source: Oral Pulse Rate: 87 Resp: 16 BP: 127/88 Patient Position (if appropriate):  Lying Oxygen Therapy SpO2: 98 % O2 Device: Room Air  Therapy/Group: Individual Therapy  Lilyan Prete [name] 09/17/2018, 7:56 AM

## 2018-09-17 NOTE — Progress Notes (Signed)
Occupational Therapy Session Note  Patient Details  Name: Edward Todd MRN: [id number] Date of Birth: 06/13/1975  Today's Date: 09/17/2018 OT Individual Time: 7471-5953 OT Individual Time Calculation (min): 69 min    Short Term Goals: Week 2:  OT Short Term Goal 1 (Week 2): Patient will complete bed mobility and functional transfers with min A OT Short Term Goal 2 (Week 2): patient will tolerate OOB for 1 hour at a time and full therapy schedule OT Short Term Goal 3 (Week 2): Patient will complete UB bathing and dressing with min A, LB bathing and dressing with mod A  Skilled Therapeutic Interventions/Progress Updates:   Pt greeted semi-reclined in bed, reporting improved pain to a 5/10 instead of 10/10 as it was earlier this morning. Pt needed encouragement to participate. OT explained importance of pushing through some of the pain when it is tolerable at a 5/10-pt agreed to try. Pt needed 25 minutes to come to sitting EOB with HOB raised. Pt would not allow OT to assist him. Once at [facility], worked on [employer]. Pt needed assistance to wash under L arm as he would not remove R UE from supported position 2/2 pain. Mod A to don shirt 2/2 difficulty lifting UEs from supported position on bed. Extended rest break after UB dressing, then pt agreeable to try LB bathing using leaning method-declined to try to stand. Pt able to lean side to side with increased time, while OT assisted with removing pants from hips. Increased time and set-up A to wash peri-area, then buttocks leaning to the side. Max A to thread pant legs, then leaning method again to pull up pants. Pt needed assistance to pull on L side, but was able to pull up R. Pt needed much more than reasonable amount of time for all BADL tasks. Pt returned to bed at end of session and left semi-reclined with bed alarm on, call bell in reach, and needs met.    Therapy Documentation Precautions:  Precautions Precautions: Fall,  Back Precaution Comments:   Restrictions Weight Bearing Restrictions: Yes Other Position/Activity Restrictions: T3, T6 - T 10 path Fx Pain: Pain Assessment Pain Scale: 0-10 Pain Score: 5  Pain Type: Acute pain Pain Location: Back Pain Orientation: Lower Pain Descriptors / Indicators: Aching;Throbbing Pain Onset: On-going Pain Intervention(s): Repositioned; pre-medicated   Therapy/Group: Individual Therapy  Valma Cava 09/17/2018, 3:39 PM

## 2018-09-17 NOTE — Progress Notes (Addendum)
Occupational Therapy Weekly Progress Note  Patient Details  Name: Edward Todd MRN: [id number] Date of Birth: 07/06/1975  Beginning of progress report period: Sep 09, 2018 End of progress report period: Sep 17, 2018  Today's Date: 09/17/2018 OT Individual Time: 0945-1030 OT Individual Time Calculation (min): 45 min    Patient has met 2 of 4 short term goals.  Pt had a few great days of therapy last week where he was able to ambulate with RW and min/CGA and needed min/mod A for BADL tasks. However, for the past few days, pt has been extremely limited by 10/10 back pain and back spasms. Pt needed much more than reasonable amount of time to participate in any mobility or functional task 2/2 pain. Nursing and MDs are aware of pain limitations with therapy and they are planning to address this. When pt is not in pain, he can do fairly well functionally, but this is currently an extremely limiting factor to patient progress. Will continue to follow up per plan of care.  Patient continues to demonstrate the following deficits: muscle weakness, decreased cardiorespiratoy endurance and decreased sitting balance, decreased standing balance and decreased balance strategies and therefore will continue to benefit from skilled OT intervention to enhance overall performance with BADL.    Continue plan of care.  OT Short Term Goals Week 1:  OT Short Term Goal 1 (Week 1): Patient will complete bed mobility and functional transfers with mod A OT Short Term Goal 1 - Progress (Week 1): Met OT Short Term Goal 2 (Week 1): patient will tolerate OOB for 1 hour at a time and full therapy schedule OT Short Term Goal 2 - Progress (Week 1): Progressing toward goal OT Short Term Goal 3 (Week 1): assess visual complaints and follow up as appropriate OT Short Term Goal 3 - Progress (Week 1): Met OT Short Term Goal 4 (Week 1): Patient will complete UB bathing and dressing with min A, LB bathing and dressing with mod  A OT Short Term Goal 4 - Progress (Week 1): Progressing toward goal Week 2:  OT Short Term Goal 1 (Week 2): Patient will complete bed mobility and functional transfers with min A OT Short Term Goal 2 (Week 2): patient will tolerate OOB for 1 hour at a time and full therapy schedule OT Short Term Goal 3 (Week 2): Patient will complete UB bathing and dressing with min A, LB bathing and dressing with mod A  Skilled Therapeutic Interventions/Progress Updates:    Pt greeted semi-reclined in bed stating 10/10 back pain. Pt did not feel like he could participate in therapy 2/2 severe pain. Discussed pain limitation with nursing and MD also aware. Nursing to administer pain medications now and MD to order scheduled pain meds as well. OT returned after meds administered. Pt agreeable to try to sit at EOB. Pt first needed to pull pants up as he had recently used the urinal. Educated on log roll technique to pull up pants with patient needed more than reasonable amount of time to roll L and R to pull up pants. Approx 15 minutes to complete task 2/2 pain. Pt then agreeable to try to sit EOB. Reviewed body mechanics and log roll technique for bed mobility. Pt extremely slow to initiate any movement 2/2 pain. Pt able to get on his side and remove 1 LE to the floor, but then had another spasms and brought LE back to the bed. The attempt at sitting EOB took approx 30 mins- and unsuccessful.   OT heated up pt's breakfast and cut pancake and suasage for easier to manage bites. Pt left with bed alarm on and needs met.   Therapy Documentation Precautions:  Precautions Precautions: Fall, Back Precaution Comments:   Restrictions Weight Bearing Restrictions: Yes Other Position/Activity Restrictions: T3, T6 - T 10 path Fx Pain: Pain Assessment Pain Scale: 0-10 Pain Score: 10-Worst pain ever Pain Type: Acute pain Pain Location: Back Pain Orientation: Lower Pain Descriptors / Indicators: Aching;Throbbing Pain  Intervention(s): RN made aware;Repositioned;Heat applied PAINAD (Pain Assessment in Advanced Dementia) Breathing: normal Negative Vocalization: none Facial Expression: smiling or inexpressive Body Language: relaxed Consolability: no need to console PAINAD Score: 0   Therapy/Group: Individual Therapy  Elisabeth S Doe 09/17/2018, 2:11 PM   

## 2018-09-18 ENCOUNTER — Inpatient Hospital Stay (HOSPITAL_COMMUNITY): Payer: 59 | Admitting: Occupational Therapy

## 2018-09-18 ENCOUNTER — Inpatient Hospital Stay (HOSPITAL_COMMUNITY): Payer: 59 | Admitting: Physical Therapy

## 2018-09-18 LAB — GLUCOSE, CAPILLARY
Glucose-Capillary: 84 mg/dL (ref 70–99)
Glucose-Capillary: 107 mg/dL — ABNORMAL HIGH (ref 70–99)
Glucose-Capillary: 112 mg/dL — ABNORMAL HIGH (ref 70–99)
Glucose-Capillary: 104 mg/dL — ABNORMAL HIGH (ref 70–99)

## 2018-09-18 NOTE — Progress Notes (Signed)
[facility] PHYSICAL MEDICINE & REHABILITATION PROGRESS NOTE  Subjective/Complaints: Patient seen laying in bed this AM.  He states he slept well overnight. He notes good improvement with pain meds.   ROS: Denies CP, shortness of breath, nausea, vomiting, diarrhea.  Objective: Vital Signs: Blood pressure 106/76, pulse 87, temperature 98 F (36.7 C), resp. rate 18, height 6' 3.51" (1.918 m), weight 130.7 kg, SpO2 97 %. No results found. Recent Labs    09/17/18 0330  WBC 4.4  HGB 7.9*  HCT 23.8*  PLT 326   Recent Labs    09/17/18 0330  NA 131*  K 4.1  CL 99  CO2 25  GLUCOSE 106*  BUN 20  CREATININE 1.19  CALCIUM 8.6*    Physical Exam: BP 106/76 (BP Location: Right Arm)   Pulse 87   Temp 98 F (36.7 C)   Resp 18   Ht 6' 3.51" (1.918 m)   Wt 130.7 kg   SpO2 97%   BMI 35.53 kg/m  Constitutional: No distress . Vital signs reviewed. HENT: Normocephalic. Atraumatic.  Eyes: EOMI. No discharge. Cardiovascular: No JVD. Respiratory: Normal effort. GI: Non-distended. Musc: No edema or tenderness in extremities, stable Neurological: Alert and oriented Good awareness of deficits.  Motor: 4-4+/5 throughout (pain inhibition)  Skin: Skin iswarmand dry.  Psychiatric: He has anormal mood and affect. Hisbehavior is normal.Judgmentand thought contentnormal.   Assessment/Plan: 1. Functional deficits secondary to multiple myeloma which require 3+ hours per day of interdisciplinary therapy in a comprehensive inpatient rehab setting.  Physiatrist is providing close team supervision and 24 hour management of active medical problems listed below.  Physiatrist and rehab team continue to assess barriers to discharge/monitor patient progress toward functional and medical goals  Care Tool:  Bathing  Bathing activity did not occur: Safety/medical concerns Body parts bathed by patient: Right arm, Left arm, Abdomen, Chest, Front perineal area, Buttocks, Right upper leg, Left  upper leg, Right lower leg, Left lower leg, Face         Bathing assist Assist Level: Minimal Assistance - Patient > 75%     Upper Body Dressing/Undressing Upper body dressing Upper body dressing/undressing activity did not occur (including orthotics): Safety/medical concerns What is the patient wearing?: Pull over shirt    Upper body assist Assist Level: Supervision/Verbal cueing    Lower Body Dressing/Undressing Lower body dressing    Lower body dressing activity did not occur: Safety/medical concerns What is the patient wearing?: Underwear/pull up, Pants     Lower body assist Assist for lower body dressing: Minimal Assistance - Patient > 75%(reacher)     Toileting Toileting    Toileting assist Assist for toileting: Minimal Assistance - Patient > 75% Assistive Device Comment: of urinal per RN report    Transfers Chair/bed transfer  Transfers assist  Chair/bed transfer activity did not occur: Safety/medical concerns  Chair/bed transfer assist level: Contact Guard/Touching assist     Locomotion Ambulation   Ambulation assist   Ambulation activity did not occur: Safety/medical concerns  Assist level: Contact Guard/Touching assist Assistive device: Walker-rolling Max distance: 130'   Walk 10 feet activity   Assist  Walk 10 feet activity did not occur: Safety/medical concerns  Assist level: Supervision/Verbal cueing Assistive device: Walker-rolling   Walk 50 feet activity   Assist Walk 50 feet with 2 turns activity did not occur: Safety/medical concerns  Assist level: Contact Guard/Touching assist      Walk 150 feet activity   Assist Walk 150 feet activity did not occur:  Safety/medical concerns         Walk 10 feet on uneven surface  activity   Assist Walk 10 feet on uneven surfaces activity did not occur: Safety/medical concerns         Wheelchair     Assist Will patient use wheelchair at discharge?: Yes Type of Wheelchair:  Manual Wheelchair activity did not occur: Safety/medical concerns(low hemoglobin/fatigue and pain/back spasms)  Wheelchair assist level: Supervision/Verbal cueing Max wheelchair distance: 100'    Wheelchair 50 feet with 2 turns activity    Assist    Wheelchair 50 feet with 2 turns activity did not occur: Safety/medical concerns   Assist Level: Supervision/Verbal cueing   Wheelchair 150 feet activity     Assist Wheelchair 150 feet activity did not occur: Safety/medical concerns          Medical Problem List and Plan: 1.Decreased functional mobilitysecondary to newly diagnosed multiple myeloma.  Continue CIR  Weekly Velcade and dexamethasone initiated 09/05/2018 per Heme/Onc 2. Antithrombotics: -DVT/anticoagulation:Subcutaneous Lovenox -antiplatelet therapy: Aspirin 81 mg daily 3. Pain Management:Oxycodone as needed -scheduled flexeril TID, DC'd on 5/25  Robaxin-750 3 times daily scheduled on 5/25 - improving -kpad prn for spasms 4. Mood:Provide emotional support -antipsychotic agents: N/A 5. Neuropsych: This patientiscapable of making decisions on hisown behalf. 6. Skin/Wound Care:Routine skin checks 7. Fluids/Electrolytes/Nutrition:Routine in and outs   Remeron started 5/18- improved appetite 8.Acute on chronic anemia. Follow-up hematology oncology services  Hemoglobin 7.9 on 5/25 after transfusion on 5/17  Hemoccult negative  Recs per Heme/Onc 9. Diabetes mellitus. Hemoglobin A1c 9.4. Provide education  Metformin 500 twice daily, increased to 1000 twice daily on 5/21  Relatively controlled on 5/26 10. Morbid obesity. BMI 35.53. Dietary follow-up 11. Constipation. Laxative assistance 12.  Hyponatremia  Sodium 131 on 5/25  Continue to monitor 13.  Fevers: Resolved  Completed 5-day course of IV Levaquin  14.  Sleep disturbance  Likely exacerbated by steroids  No benefit with  Ambien  Remeron started on 5/18  Improved 15.  Elevated diastolic blood pressure  Continue to monitor  Relatively controlled on 5/26  LOS: 11 days A FACE TO FACE EVALUATION WAS PERFORMED  Ankit [name] 09/18/2018, 8:54 AM

## 2018-09-18 NOTE — Progress Notes (Signed)
Occupational Therapy Session Note  Patient Details  Name: Edward Todd MRN: [id number] Date of Birth: 03/11/1976  Today's Date: 09/18/2018  Session 1 OT Individual Time: 7564-3329 OT Individual Time Calculation (min): 48 min   Session 2 OT Individual Time: 1416-1530 OT Individual Time Calculation (min): 74 min    Short Term Goals: Week 2:  OT Short Term Goal 1 (Week 2): Patient will complete bed mobility and functional transfers with min A OT Short Term Goal 2 (Week 2): patient will tolerate OOB for 1 hour at a time and full therapy schedule OT Short Term Goal 3 (Week 2): Patient will complete UB bathing and dressing with min A, LB bathing and dressing with mod A  Skilled Therapeutic Interventions/Progress Updates:  Session 1   Pt greeted sitting up in wc and agreeable to OT treatment session. Pt seems much better today and states his pain is much more controlled today. Pt requests to work on LB strength. Pt brought to therapy gym and completed sit<>stand from wc w/ verbal cues for hand placement on RW and mod A to come to standing. Pt ambulated 5 feet to NuStep, but felt NuStep seat was too low and did not feel confident to sit down. Pt returned to sitting in wc and brought to [facility] for LE strengthening. Pt completed Kinetron for 15 mins. Incorporated diaphramatic breathing and core strengthening with each exhale. Continued LB there ex with 2 sets of 10 seated knee extension, hip extension, hip adduction/abduction. Pt returned to room and agreeable to stay up in wc. Pt left with chair alarm on and needs met.   Session 2 Pt greeted sitting in wc eating lunch and agreeable to OT treatment session. Pt had agreed to shower this afternoon during OT session this am, but now states he does not feel like it. Pt reported need to go to the bathroom sit<>stand from wc with min A. Pt stood and marched in place for ~ 7 minutes but did not take actual steps towards the bathroom w/ RW. Pt states  his legs and core feel too weak-like they are going to give out. Pt returned to sitting in wc and wc brought into bathroom for stand-pivot to commode with increased time and min A. Pt did not feel comfortable removing unilateral UE from grab bar and needed OT assist to doff pants prior to sitting on toilet. Pt with successful BM and voided bladder. Sit<>stand from raised commode seat with min A and heavy use of grab bars. Pt declined to attempt to clean buttocks and needed OT assist. Pt holding onto grab bars with BUEs and OT encouraged him to try to remove unilateral UE to wash peri-area. With increased time and encouragement, pt removed L UE from grab bar and washed peri-area. Total A for clothing management and wc pulled up behind pt. Pt needed more than reasonable amount of time for all taks, but more functional than yesterday. Pt requests to return to bed. Squat-pivot back to bed with min A. Pt doffed shoes seated EOB, then completed bed mobility with increased time and supervision. Pt left semi-reclined in bed with bed alarm on and needs met.   Therapy Documentation Precautions:  Precautions Precautions: Fall, Back Precaution Comments:   Restrictions Weight Bearing Restrictions: No Other Position/Activity Restrictions: T3, T6 - T 10 path Fx Pain: Pain Assessment Pain Scale: 0-10 Pain Score: 6  Pain Type: Acute pain Pain Location: Back Pain Orientation: Lower Pain Descriptors / Indicators: Aching;Throbbing Pain Frequency: Constant Pain Onset:  On-going Pain Intervention(s): Rest;;Heat applied;Repositioned  Therapy/Group: Individual Therapy  Valma Cava 09/18/2018, 3:41 PM

## 2018-09-18 NOTE — Progress Notes (Signed)
Pharmacy made aware that we can administer the Velcade tomorrow at [place], [place]

## 2018-09-18 NOTE — Progress Notes (Signed)
Physical Therapy Session Note  Patient Details  Name: Edward Todd MRN: [id number] Date of Birth: 1976/02/10  Today's Date: 09/18/2018 PT Individual Time: 0805-0900 PT Individual Time Calculation (min): 55 min   Short Term Goals: Week 2:  PT Short Term Goal 1 (Week 2): Pt will initiate step/stair training PT Short Term Goal 2 (Week 2): Pt will transfer supine>sit and stand pivot transfers within 5 minutes, 75% of the time PT Short Term Goal 3 (Week 2): Pt will participate in 60 min of OOB activity w/o increase in fatigue   Skilled Therapeutic Interventions/Progress Updates:   Pt in supine and agreeable to therapy, states feeling better today but pain 7/10 in back - premedicated. Pt able to move at more functional speed today vs last couple of days, however continues to be slow. Supine>sit w/ supervision. Maintained static supervision w/ intermittent UE support while RN provided medication. Sit<>stand w/ CGA from elevated bed height. Maintained static stance for 8 minutes w/ close supervision while preparing himself to ambulate. Ambulated 57' w/ CGA using RW and w/c follow for safety. Pt continues to ambulate very slow, however has decreased reliance of UEs on RW for support. Returned to room total assist via w/c. Ended session in w/c, all needs in reach.   Therapy Documentation Precautions:  Precautions Precautions: Fall, Back Precaution Comments:   Restrictions Weight Bearing Restrictions: No Other Position/Activity Restrictions: T3, T6 - T 10 path Fx Vital Signs:   Pain: Pain Assessment Pain Scale: 0-10 Pain Score: 6  Pain Type: Acute pain Pain Location: Back Pain Orientation: Lower Pain Descriptors / Indicators: Aching;Throbbing Pain Frequency: Constant Pain Onset: On-going Pain Intervention(s): Medication (See eMAR);Heat applied;Repositioned  Therapy/Group: Individual Therapy  Riannon Mukherjee [name] 09/18/2018, 12:43 PM

## 2018-09-18 NOTE — Plan of Care (Signed)
  Problem: RH BOWEL ELIMINATION Goal: RH STG MANAGE BOWEL W/MEDICATION W/ASSISTANCE Description STG Manage Bowel with Medication Min with Assistance.  Outcome: Progressing   Problem: RH SKIN INTEGRITY Goal: RH STG SKIN FREE OF INFECTION/BREAKDOWN Description Patient free from skin breakdown during admission  Outcome: Progressing Goal: RH STG MAINTAIN SKIN INTEGRITY WITH ASSISTANCE Description STG Maintain Skin Integrity With [place].  Outcome: Progressing   Problem: RH SAFETY Goal: RH STG ADHERE TO SAFETY PRECAUTIONS W/ASSISTANCE/DEVICE Description STG Adhere to Safety Precautions With Min Assistance/Device.  Outcome: Progressing

## 2018-09-18 NOTE — Progress Notes (Addendum)
Edward Todd   DOB:1975/10/06   FM#:[id number]   TTS#:[id number]  Hem/Onc follow up   Subjective: Continues to do well with rehab. He anticipates D/C on 09/26/2018. Remains on Revlimid and tolerating well.  Reports mild fatigue but he is not sure if this is related to his treatment versus rehab.  Denies diarrhea.  Back pain is much improved.  He has no other complaints today.  Objective:  Vitals:   09/17/18 2034 09/18/18 0614  BP: 130/83 106/76  Pulse: 98 87  Resp: 19 18  Temp: 98.3 F (36.8 C) 98 F (36.7 C)  SpO2: 100% 97%    Body mass index is 35.53 kg/m.  Intake/Output Summary (Last 24 hours) at 09/18/2018 1319 Last data filed at 09/18/2018 1138 Gross per 24 hour  Intake 430 ml  Output 900 ml  Net -470 ml     Sclerae unicteric  Oropharynx clear  No peripheral adenopathy  Lungs clear -- no rales or rhonchi  Heart regular rate and rhythm  Abdomen benign    CBG (last 3)  Recent Labs    09/17/18 2131 09/18/18 0614 09/18/18 1137  GLUCAP 92 84 107*    Urine Studies No results for input(s): UHGB, CRYS in the last 72 hours.  Invalid input(s): UACOL, UAPR, USPG, UPH, UTP, UGL, UKET, UBIL, UNIT, UROB, ULEU, UEPI, UWBC, URBC, UBAC, CAST, UCOM, BILUA  Basic Metabolic Panel: Recent Labs  Lab 09/12/18 0419 09/17/18 0330  NA 134* 131*  K 3.5 4.1  CL 107 99  CO2 20* 25  GLUCOSE 106* 106*  BUN 12 20  CREATININE 1.02 1.19  CALCIUM 7.5* 8.6*   GFR Estimated Creatinine Clearance: 116.2 mL/min (by C-G formula based on SCr of 1.19 mg/dL). Liver Function Tests: No results for input(s): AST, ALT, ALKPHOS, BILITOT, PROT, ALBUMIN in the last 168 hours. No results for input(s): LIPASE, AMYLASE in the last 168 hours. No results for input(s): AMMONIA in the last 168 hours. Coagulation profile No results for input(s): INR, PROTIME in the last 168 hours.  CBC: Recent Labs  Lab 09/12/18 0419 09/17/18 0330  WBC 4.1 4.4  NEUTROABS 1.7 2.4  HGB 7.8* 7.9*  HCT 23.2*  23.8*  MCV 89.6 90.5  PLT 222 326   Cardiac Enzymes: No results for input(s): CKTOTAL, CKMB, CKMBINDEX, TROPONINI in the last 168 hours. BNP: Invalid input(s): POCBNP CBG: Recent Labs  Lab 09/17/18 1122 09/17/18 1650 09/17/18 2131 09/18/18 0614 09/18/18 1137  GLUCAP 104* 124* 92 84 107*   D-Dimer No results for input(s): DDIMER in the last 72 hours. Hgb A1c No results for input(s): HGBA1C in the last 72 hours. Lipid Profile No results for input(s): CHOL, HDL, LDLCALC, TRIG, CHOLHDL, LDLDIRECT in the last 72 hours. Thyroid function studies No results for input(s): TSH, T4TOTAL, T3FREE, THYROIDAB in the last 72 hours.  Invalid input(s): FREET3 Anemia work up No results for input(s): VITAMINB12, FOLATE, FERRITIN, TIBC, IRON, RETICCTPCT in the last 72 hours. Microbiology Recent Results (from the past 240 hour(s))  Culture, Urine     Status: Abnormal   Collection Time: 09/08/18  1:42 PM  Result Value Ref Range Status   Specimen Description URINE, CLEAN CATCH  Final   Special Requests NONE  Final   Culture (A)  Final    <10,000 COLONIES/mL INSIGNIFICANT GROWTH Performed at [facility], [address] [place], [place] [address]    Report Status 09/09/2018 FINAL  Final      Studies:  No results found.  Assessment:  43 y.o.  African-American male, without past medical history, presented with debilitating low back pain,and moderate anemia.  1.Multiple myeloma, IgG Kappa, stage III 2.Moderate anemia, worsening after chemo, 1u PRBC on 5/17 3. Type 2 diabetes  4. Severe low back pain, from thoracic and lumbar compression fracture, lumbar spine stenosis; improving  5. Neutropenia and fever, resolved now  6. Hyponatremia  7. Hypocalcemia    Plan:  -he is doing well with rehab. He anticipates discharge next week.  - Continue Revlimid for 14 days. Due for next Velcade injection on 09/19/2018. Will recheck CBC with diff and CMET in am in anticipation of  Velcade.  -Continue Robaxin as needed before PT -Continue calcium and vitD, level low probably related to zometa infusion; improved on labs from 09/17/2018.    Mikey Bussing, NP 09/18/2018    Addendum I have seen the patient, examined him. I agree with the assessment and and plan and have edited the notes.   Pt is doing well overall. Will check lab tomorrow morning and proceed with week 3 dexa and Velcade injection tomorrow. Continue Revlimid, he is tolerating well. Pt states he is likely going home on 6/3. If that's the plan, I may give his next week Velcade injection in my clinic on 6/4. Will continue to f/u.  Truitt Merle  09/18/2018

## 2018-09-19 ENCOUNTER — Inpatient Hospital Stay (HOSPITAL_COMMUNITY): Payer: 59 | Admitting: Physical Therapy

## 2018-09-19 ENCOUNTER — Encounter (HOSPITAL_COMMUNITY): Payer: Self-pay | Admitting: Hematology

## 2018-09-19 ENCOUNTER — Inpatient Hospital Stay (HOSPITAL_COMMUNITY): Payer: 59

## 2018-09-19 DIAGNOSIS — N179 Acute kidney failure, unspecified: Secondary | ICD-10-CM

## 2018-09-19 LAB — CBC WITH DIFFERENTIAL/PLATELET
Abs Immature Granulocytes: 0.02 10*3/uL (ref 0.00–0.07)
Basophils Absolute: 0 10*3/uL (ref 0.0–0.1)
Basophils Relative: 0 %
Eosinophils Absolute: 0.1 10*3/uL (ref 0.0–0.5)
Eosinophils Relative: 2 %
HCT: 25.1 % — ABNORMAL LOW (ref 39.0–52.0)
Hemoglobin: 8.2 g/dL — ABNORMAL LOW (ref 13.0–17.0)
Immature Granulocytes: 0 %
Lymphocytes Relative: 35 %
Lymphs Abs: 1.8 10*3/uL (ref 0.7–4.0)
MCH: 29.9 pg (ref 26.0–34.0)
MCHC: 32.7 g/dL (ref 30.0–36.0)
MCV: 91.6 fL (ref 80.0–100.0)
Monocytes Absolute: 0.3 10*3/uL (ref 0.1–1.0)
Monocytes Relative: 6 %
Neutro Abs: 2.9 10*3/uL (ref 1.7–7.7)
Neutrophils Relative %: 57 %
Platelets: 371 10*3/uL (ref 150–400)
RBC: 2.74 MIL/uL — ABNORMAL LOW (ref 4.22–5.81)
RDW: 14.6 % (ref 11.5–15.5)
WBC: 5 10*3/uL (ref 4.0–10.5)
nRBC: 0 % (ref 0.0–0.2)

## 2018-09-19 LAB — GLUCOSE, CAPILLARY
Glucose-Capillary: 95 mg/dL (ref 70–99)
Glucose-Capillary: 89 mg/dL (ref 70–99)
Glucose-Capillary: 147 mg/dL — ABNORMAL HIGH (ref 70–99)
Glucose-Capillary: 227 mg/dL — ABNORMAL HIGH (ref 70–99)

## 2018-09-19 LAB — COMPREHENSIVE METABOLIC PANEL
ALT: 42 U/L (ref 0–44)
AST: 26 U/L (ref 15–41)
Albumin: 2.6 g/dL — ABNORMAL LOW (ref 3.5–5.0)
Alkaline Phosphatase: 65 U/L (ref 38–126)
Anion gap: 11 (ref 5–15)
BUN: 29 mg/dL — ABNORMAL HIGH (ref 6–20)
CO2: 21 mmol/L — ABNORMAL LOW (ref 22–32)
Calcium: 9.1 mg/dL (ref 8.9–10.3)
Chloride: 99 mmol/L (ref 98–111)
Creatinine, Ser: 1.32 mg/dL — ABNORMAL HIGH (ref 0.61–1.24)
GFR calc Af Amer: >60 mL/min (ref >60)
GFR calc non Af Amer: >60 mL/min (ref >60)
Glucose, Bld: 99 mg/dL (ref 70–99)
Potassium: 4.6 mmol/L (ref 3.5–5.1)
Sodium: 131 mmol/L — ABNORMAL LOW (ref 135–145)
Total Bilirubin: 0.6 mg/dL (ref 0.3–1.2)
Total Protein: 11.2 g/dL — ABNORMAL HIGH (ref 6.5–8.1)

## 2018-09-19 MED ORDER — PROCHLORPERAZINE MALEATE 5 MG PO TABS
10.0000 mg | ORAL_TABLET | Freq: Once | ORAL | Status: AC
  Administered 2018-09-19: 10 mg via ORAL
  Filled 2018-09-19: qty 2

## 2018-09-19 MED ORDER — BORTEZOMIB CHEMO SQ INJECTION 3.5 MG (2.5MG/ML)
1.3000 mg/m2 | Freq: Once | INTRAMUSCULAR | Status: AC
  Administered 2018-09-19: 3.5 mg via SUBCUTANEOUS
  Filled 2018-09-19: qty 1.4

## 2018-09-19 MED ORDER — ENSURE ENLIVE PO LIQD
237.0000 mL | Freq: Two times a day (BID) | ORAL | Status: DC
  Administered 2018-09-19 – 2018-09-25 (×12): 237 mL via ORAL

## 2018-09-19 MED ORDER — DEXAMETHASONE 6 MG PO TABS
40.0000 mg | ORAL_TABLET | Freq: Once | ORAL | Status: AC
  Administered 2018-09-19: 40 mg via ORAL
  Filled 2018-09-19 (×2): qty 1

## 2018-09-19 NOTE — Progress Notes (Signed)
Nutrition Follow-up  RD working remotely.  DOCUMENTATION CODES:   Obesity unspecified  INTERVENTION:   - d/c Ensure Max  - Ensure Enlive po BID, each supplement provides 350 kcal and 20 grams of protein  - Continue Pro-stat 30 ml BID, each supplement provides 100 kcal and 15 grams of protein  - Encourage adequate PO intake  NUTRITION DIAGNOSIS:   Increased nutrient needs related to cancer and cancer related treatments (new dx of MM s/p first tx) as evidenced by estimated needs.  Ongoing, being addressed via oral nutrition supplements  GOAL:   Patient will meet greater than or equal to 90% of their needs  Progressing  MONITOR:   PO intake, Labs, I & O's, Supplement acceptance, Diet advancement, Weight trends  REASON FOR ASSESSMENT:   Consult Poor PO  ASSESSMENT:   43 y/o male PMHx obesity, recent dx of DM2 as well as recent dx of Multiple Myeloma. Had been hospitalized at [facility] 5/7-5/15 after he presented with intractable back pain. Found to have acute anemia and MRI concerning for abnormal process of marrow s/p biopsy 5/12. Dx w/ MM and received first tx 5/13. Tx to CIR for rehab.   Remeron started 5/18 with noted increase in appetite and PO intake per MD. Noted planned d/c date of 6/3.  Pt is to have third week of Velcade injection of dexamethasone today.  No new weights since admission.  Spoke with pt via phone call to room. Pt in good spirits, states he is "hanging in there." Pt reports that his appetite has improved since Remeron was started. Pt states that breakfast is never a problem but that his appetite "goes downhill" as lunch and dinner approach. Pt states he has been able to find foods he enjoys eating and that he tolerates well including PB&J sandwiches, [place] and cheese sandwiches, and soft fruits like peaches.  Pt reports that his nausea "goes back and forth" but states he is not nauseous currently.  Pt shares that Ensure Max "messes up my stomach"  but that Ensure Enlive does not. Pt prefers vanilla flavor. Will switch supplement form Ensure Max to Ensure Enlive and assess tolerance. Pt endorses taking Pro-stat packets.  RD encouraged continued po intake with a focus on protein-rich foods to aid in maintaining lean muscle mass. Pt expresses understanding.  Meal Completion: 25-100% x last 8 recorded meals (averaging 75%, improved)  Medications reviewed and include: Oscal with D, Decadron, Pro-stat 30 ml BID, SSI, Metformin, Remeron 15 mg daily, Protonix, Miralax, K-dur 20 mEq daily, Ensure Max BID, Senna  Labs reviewed: sodium 131 (L), BUN 29 (H), creatinine 1.32 (H), hemoglobin 8.2 (L) CBG's: 95, 104, 112, 107 x 24 hours  UOP: 1925 ml x 24 hours I/O's: -2.6 L since admit  Diet Order:   Diet Order            Diet Carb Modified Fluid consistency: Thin; Room service appropriate? Yes  Diet effective now              EDUCATION NEEDS:   Education needs have been addressed  Skin:  Skin Assessment: Reviewed RN Assessment  Last BM:  09/18/18 large type 6  Height:   Ht Readings from Last 1 Encounters:  09/07/18 6' 3.51" (1.918 m)    Weight:   Wt Readings from Last 1 Encounters:  09/07/18 130.7 kg    Ideal Body Weight:  90.45 kg  BMI:  Body mass index is 35.53 kg/m.  Estimated Nutritional Needs:   Kcal:  2100-2350 (16-18 kcal/kg bw)  Protein:  105-120g Pro (~20% of energy)  Fluid:  >2.1 L fluid (64m/ kcal)    KGaynell Face MS, RD, LDN Inpatient Clinical Dietitian Pager: [phone]/After Hours: [phone]

## 2018-09-19 NOTE — Plan of Care (Signed)
  Problem: RH BOWEL ELIMINATION Goal: RH STG MANAGE BOWEL W/MEDICATION W/ASSISTANCE Description STG Manage Bowel with Medication Min with Assistance.  Outcome: Progressing   Problem: RH SKIN INTEGRITY Goal: RH STG SKIN FREE OF INFECTION/BREAKDOWN Description Patient free from skin breakdown during admission  Outcome: Progressing Goal: RH STG MAINTAIN SKIN INTEGRITY WITH ASSISTANCE Description STG Maintain Skin Integrity With [place].  Outcome: Progressing   Problem: RH SAFETY Goal: RH STG ADHERE TO SAFETY PRECAUTIONS W/ASSISTANCE/DEVICE Description STG Adhere to Safety Precautions With Min Assistance/Device.  Outcome: Progressing   Problem: RH PAIN MANAGEMENT Goal: RH STG PAIN MANAGED AT OR BELOW PT'S PAIN GOAL Description Pain free or pain less than 3 during admission  Outcome: Progressing   Problem: RH KNOWLEDGE DEFICIT GENERAL Goal: RH STG INCREASE KNOWLEDGE OF SELF CARE AFTER HOSPITALIZATION Outcome: Progressing

## 2018-09-19 NOTE — Progress Notes (Signed)
Patient slept well throughout the night. Medicated x1 with oxycodone for back pain with partial effects noted.

## 2018-09-19 NOTE — Progress Notes (Signed)
Physical Therapy Session Note  Patient Details  Name: Edward Todd MRN: [id number] Date of Birth: 23-Jul-1975  Today's Date: 09/19/2018 PT Individual Time: 1150-1230 AND 1330-1425 PT Individual Time Calculation (min): 40 min AND 55 min  Short Term Goals: Week 2:  PT Short Term Goal 1 (Week 2): Pt will initiate step/stair training PT Short Term Goal 2 (Week 2): Pt will transfer supine>sit and stand pivot transfers within 5 minutes, 75% of the time PT Short Term Goal 3 (Week 2): Pt will participate in 60 min of OOB activity w/o increase in fatigue   Skilled Therapeutic Interventions/Progress Updates:   Session 1:  Pt in supine and agreeable to therapy, pain 5/10 in back (premedicated). Supine>sit w/ supervision and increased time, required only 2-3 min to transfer to EOB this session, much improved from previous sessions with this therapist. Sit>stand to RW w/ CGA, needed >5 min to stand w/ RW prior to being able to move feet 2/2 pain and fear of movement despite encouragement from therapist. Made multiple attempts at negotiating curb w/ RW, however pt ultimately unable to stand w/o RW assist to move it onto curb even w/ physical assist from therapist. Pt fearful of falling and does not feel his balance can adequately hold him up for the task. Ended session in w/c, all needs in reach. Agreeable to attempt to sit up until therapist returns 1 hr later.   Session 2:  Pt in w/c and agreeable to therapy, pain remains 5/10 in back. Pt self-propelled w/c to/from therapy gym w/ supervision using BUEs to work on endurance and strengthening. Worked on Industrial/product designer w/ 3" step and B rails to introduce curb negotiation in safer environment. Pt able to negotiate w/ alternating feet leading x10-15 reps in 1 bout of standing, heavy rail reliance. This was also the first time pt stood up w/ AD other than RW. Discussed mental barriers pt will need to overcome prior to being able to negotiate threshold into  his house, also discussed ways to overcome these barriers including mental visualization of successful task completion and thinking about what his motivations are while performing the task. Sit<>stand to rails again and negotiated 8 three inch steps w/ CGA using B rails for practice w/ curb negotiation and functional LE strengthening. Returned to room, ended session sitting on toilet, call bell in reach.   Therapy Documentation Precautions:  Precautions Precautions: Fall, Back Precaution Comments:   Restrictions Weight Bearing Restrictions: No Other Position/Activity Restrictions: T3, T6 - T 10 path Fx Pain: Pain Assessment Pain Score: 7  Pain Type: Acute pain Pain Location: Back Pain Orientation: Medial;Lower Pain Descriptors / Indicators: Aching;Sharp Pain Onset: On-going Pain Intervention(s): Repositioned;Ambulation/increased activity  Therapy/Group: Individual Therapy  Corliss Coggeshall [name] 09/19/2018, 12:28 PM

## 2018-09-19 NOTE — Progress Notes (Signed)
Occupational Therapy Session Note  Patient Details  Name: Edward Todd MRN: [id number] Date of Birth: April 07, 1976  Today's Date: 09/19/2018 OT Individual Time: 9937-1696 OT Individual Time Calculation (min): 65 min    Short Term Goals: Week 2:  OT Short Term Goal 1 (Week 2): Patient will complete bed mobility and functional transfers with min A OT Short Term Goal 2 (Week 2): patient will tolerate OOB for 1 hour at a time and full therapy schedule OT Short Term Goal 3 (Week 2): Patient will complete UB bathing and dressing with min A, LB bathing and dressing with mod A  Skilled Therapeutic Interventions/Progress Updates:    Pt received supine with c/o back pain 6/10 no request for intervention. Pt transitioned to EOB with increased time, breathing through pain and using rest breaks as needed. Pt able to doff shorts using lateral leans EOB, (S). Discussed use of reacher vs. Figure 4 technique to don pants while adhering to back precautions. Pt unable to fully reach figure 4 or cross legs d/t pain. Instructed pt in using cane to act as reacher d/t reacher not being in room. Pt able to thread shorts over feet with min A. Pt stood with (S) from EOB. Pt required more than reasonable time for all tasks this session 2/2 pain. Pt stood for 30 minutes while he pulled up shorts and completed various static balance activities, including single leg stance with BUE support on RW. Pt required CGA for release of one and then both UE from RW for brief moments. Pt able to reach forward for ~5 seconds with BUE, CGA provided at core to stabilize. Pt benefited from increased cueing/motivation and admitted nerves rather than weakness was inhibiting him from removing BUE from RW. RN briefly entered room to administer chemo injection. Pt required min A to return supine. Pt returned to EOB and donned pants with mod A for time management. Pt completed ~5 ft of functional mobility in room with RW and returned to sitting  EOB. Pt was left semi reclined EOB with bed alarm set. All needs within reach.   Therapy Documentation Precautions:  Precautions Precautions: Fall, Back Precaution Comments:   Restrictions Weight Bearing Restrictions: No Other Position/Activity Restrictions: T3, T6 - T 10 path Fx   Therapy/Group: Individual Therapy  Curtis Sites 09/19/2018, 7:21 AM

## 2018-09-19 NOTE — Progress Notes (Signed)
Occupational Therapy Session Note  Patient Details  Name: Edward Todd MRN: [id number] Date of Birth: [birth year]  Today's Date: 09/19/2018 OT Individual Time: 2411-4643 OT Individual Time Calculation (min): 30 min    Short Term Goals: Week 1:  OT Short Term Goal 1 (Week 1): Patient will complete bed mobility and functional transfers with mod A OT Short Term Goal 1 - Progress (Week 1): Met OT Short Term Goal 2 (Week 1): patient will tolerate OOB for 1 hour at a time and full therapy schedule OT Short Term Goal 2 - Progress (Week 1): Progressing toward goal OT Short Term Goal 3 (Week 1): assess visual complaints and follow up as appropriate OT Short Term Goal 3 - Progress (Week 1): Met OT Short Term Goal 4 (Week 1): Patient will complete UB bathing and dressing with min A, LB bathing and dressing with mod A OT Short Term Goal 4 - Progress (Week 1): Progressing toward goal  Skilled Therapeutic Interventions/Progress Updates:    1:1. Pt received in bed asleep requiring incresed time to arouse. Pt requesting 15 min to eat breakfast/wake up. OT returns for session after requested time. Upon returning pt attempting to sit up EOB. Pt requires 10 min to fully sit up EOB with min VC for technique (pt resistant to cues stating, "I got it"). Pt sits EOB to wash face, underarms and stomach, change clothing and brush teeth with supervision for sitting balance. 8/10 pain reported in back. RN aware. Pt requests to sit EOB for consumption of breakfast. Exited session with pt seated EOB, Call light in reach and exit alarlm on  Therapy Documentation Precautions:  Precautions Precautions: Fall, Back Precaution Comments:   Restrictions Weight Bearing Restrictions: No Other Position/Activity Restrictions: T3, T6 - T 10 path Fx General:   Vital Signs: Therapy Vitals Temp: 98.3 F (36.8 C) Pulse Rate: 93 Resp: 17 BP: 126/89 Patient Position (if appropriate): Lying Oxygen Therapy SpO2: 97 % O2  Device: Room Air Pain:   ADL: ADL Eating: Other (comment)(patient needs assist at this time for cup to mouth due to pain and fatigue) Where Assessed-Eating: Bed level Grooming: Not assessed(unable to tolerate) Upper Body Bathing: Not assessed(unable to tolerate) Lower Body Bathing: Not assessed(unable to tolerate) Upper Body Dressing: Not assessed(unable to tolerate) Lower Body Dressing: Not assessed(unable to tolerate) Toileting: Not assessed(unable to tolerate) Toilet Transfer: Not assessed(unable to tolerate) Social research officer, government: Not assessed(unable to tolerate) ADL Comments: patient able to wash his face with min A Vision   Perception    Praxis   Exercises:   Other Treatments:     Therapy/Group: Individual Therapy  Tonny Branch 09/19/2018, 7:06 AM

## 2018-09-19 NOTE — Progress Notes (Signed)
[facility] PHYSICAL MEDICINE & REHABILITATION PROGRESS NOTE  Subjective/Complaints: Patient seen sitting up at the edge of his bed this morning working with therapies.  He states he slept well overnight.  He has questions regarding protein supplementation.  Discussed mood with nursing.  ROS: Denies CP, shortness of breath, nausea, vomiting, diarrhea.  Objective: Vital Signs: Blood pressure 126/89, pulse 93, temperature 98.3 F (36.8 C), resp. rate 17, height 6' 3.51" (1.918 m), weight 130.7 kg, SpO2 97 %. No results found. Recent Labs    09/17/18 0330 09/19/18 0418  WBC 4.4 5.0  HGB 7.9* 8.2*  HCT 23.8* 25.1*  PLT 326 371   Recent Labs    09/17/18 0330 09/19/18 0418  NA 131* 131*  K 4.1 4.6  CL 99 99  CO2 25 21*  GLUCOSE 106* 99  BUN 20 29*  CREATININE 1.19 1.32*  CALCIUM 8.6* 9.1    Physical Exam: BP 126/89 (BP Location: Left Arm)   Pulse 93   Temp 98.3 F (36.8 C)   Resp 17   Ht 6' 3.51" (1.918 m)   Wt 130.7 kg   SpO2 97%   BMI 35.53 kg/m  Constitutional: No distress . Vital signs reviewed. HENT: Normocephalic.  Atraumatic.  Eyes: EOMI.  No discharge. Cardiovascular: No JVD. Respiratory: Normal effort. GI: Non-distended. Musc: No edema or tenderness in extremities, unchanged Neurological: Alert and oriented Good awareness of deficits.  Motor: 4-/5 throughout (pain inhibition)  Skin: Skin iswarmand dry.  Psychiatric: He has anormal mood and affect. Hisbehavior is normal.Judgmentand thought contentnormal.   Assessment/Plan: 1. Functional deficits secondary to multiple myeloma which require 3+ hours per day of interdisciplinary therapy in a comprehensive inpatient rehab setting.  Physiatrist is providing close team supervision and 24 hour management of active medical problems listed below.  Physiatrist and rehab team continue to assess barriers to discharge/monitor patient progress toward functional and medical goals  Care Tool:  Bathing   Bathing activity did not occur: Safety/medical concerns Body parts bathed by patient: Right arm, Left arm, Abdomen, Chest, Front perineal area, Buttocks, Right upper leg, Left upper leg, Right lower leg, Left lower leg, Face         Bathing assist Assist Level: Minimal Assistance - Patient > 75%     Upper Body Dressing/Undressing Upper body dressing Upper body dressing/undressing activity did not occur (including orthotics): Safety/medical concerns What is the patient wearing?: Pull over shirt    Upper body assist Assist Level: Supervision/Verbal cueing    Lower Body Dressing/Undressing Lower body dressing    Lower body dressing activity did not occur: Safety/medical concerns What is the patient wearing?: Underwear/pull up, Pants     Lower body assist Assist for lower body dressing: Minimal Assistance - Patient > 75%(reacher)     Toileting Toileting    Toileting assist Assist for toileting: Minimal Assistance - Patient > 75% Assistive Device Comment: of urinal per RN report    Transfers Chair/bed transfer  Transfers assist  Chair/bed transfer activity did not occur: Safety/medical concerns  Chair/bed transfer assist level: Contact Guard/Touching assist     Locomotion Ambulation   Ambulation assist   Ambulation activity did not occur: Safety/medical concerns  Assist level: Contact Guard/Touching assist Assistive device: Walker-rolling Max distance: 50'   Walk 10 feet activity   Assist  Walk 10 feet activity did not occur: Safety/medical concerns  Assist level: Contact Guard/Touching assist Assistive device: Walker-rolling   Walk 50 feet activity   Assist Walk 50 feet with 2 turns  activity did not occur: Safety/medical concerns  Assist level: Contact Guard/Touching assist Assistive device: Walker-rolling    Walk 150 feet activity   Assist Walk 150 feet activity did not occur: Safety/medical concerns         Walk 10 feet on uneven surface   activity   Assist Walk 10 feet on uneven surfaces activity did not occur: Safety/medical concerns         Wheelchair     Assist Will patient use wheelchair at discharge?: Yes Type of Wheelchair: Manual Wheelchair activity did not occur: Safety/medical concerns(low hemoglobin/fatigue and pain/back spasms)  Wheelchair assist level: Supervision/Verbal cueing Max wheelchair distance: 100'    Wheelchair 50 feet with 2 turns activity    Assist    Wheelchair 50 feet with 2 turns activity did not occur: Safety/medical concerns   Assist Level: Supervision/Verbal cueing   Wheelchair 150 feet activity     Assist Wheelchair 150 feet activity did not occur: Safety/medical concerns          Medical Problem List and Plan: 1.Decreased functional mobilitysecondary to newly diagnosed multiple myeloma.  Continue CIR  Weekly Velcade and dexamethasone initiated 09/05/2018 per Heme/Onc  Team conference today to discuss current and goals and coordination of care, home and environmental barriers, and discharge planning with nursing, case manager, and therapies.  2. Antithrombotics: -DVT/anticoagulation:Subcutaneous Lovenox -antiplatelet therapy: Aspirin 81 mg daily 3. Pain Management:Oxycodone as needed -scheduled flexeril TID, DC'd on 5/25  Robaxin-750 3 times daily scheduled on 5/25 - improving -kpad prn for spasms 4. Mood:Provide emotional support -antipsychotic agents: N/A 5. Neuropsych: This patientiscapable of making decisions on hisown behalf. 6. Skin/Wound Care:Routine skin checks 7. Fluids/Electrolytes/Nutrition:Routine in and outs   Remeron started 5/18- improved appetite 8.Acute on chronic anemia. Follow-up hematology oncology services  Hemoglobin 8.2 on 5/27 after transfusion on 5/17  Hemoccult negative  Recs per Heme/Onc 9. Diabetes mellitus. Hemoglobin A1c 9.4. Provide education  Metformin  500 twice daily, increased to 1000 twice daily on 5/21  Relatively controlled on 5/26 10. Morbid obesity. BMI 35.53. Dietary follow-up 11. Constipation. Laxative assistance 12.  Hyponatremia  Sodium 131 on 5/27  Continue to monitor 13.  Fevers: Resolved  Completed 5-day course of IV Levaquin  14.  Sleep disturbance  Likely exacerbated by steroids  No benefit with Ambien  Remeron started on 5/18  Improved 15.  Elevated diastolic blood pressure  Continue to monitor  Controlled on 5/27 17.  AKI  Creatinine 1.32 on 5/27  Encourage fluids  LOS: 12 days A FACE TO FACE EVALUATION WAS PERFORMED  Jimya Ciani [name] 09/19/2018, 9:02 AM

## 2018-09-19 NOTE — Progress Notes (Signed)
Physical Therapy Session Note  Patient Details  Name: Edward Todd MRN: [id number] Date of Birth: [birth year]  Today's Date: 09/19/2018 PT Individual Time: 0830-0900 PT Individual Time Calculation (min): 30 min   Short Term Goals: Week 2:  PT Short Term Goal 1 (Week 2): Pt will initiate step/stair training PT Short Term Goal 2 (Week 2): Pt will transfer supine>sit and stand pivot transfers within 5 minutes, 75% of the time PT Short Term Goal 3 (Week 2): Pt will participate in 60 min of OOB activity w/o increase in fatigue   Skilled Therapeutic Interventions/Progress Updates:    Patient in supine and agreeable to PT.  Reports pain medications delivered this am.  Requesting to work on balance this session.  Performed supine to sit slowly and cautiously with intermittent stretches, self massages and relaxation techniques for pain control all with S using bed rail.  Patient seated EOB for some time and continuing with pain management techniques.  Sit to stand from elevated height surface min A with RW and performed standing alternating step taps forward, ant/post rocking with cues for hip flex/ext, minisquats, hamstring curls, stepping in and out and measuring BOS for balance and standing intermittently with 1 UE support.  Standing total time about 10-12 minutes with pt reporting posture improvement over prior sessions.  Stand to sit with increased time and cues for positioning along EOB.  Patient sit to semireclined positioned at EOB with S and left with HOB elevated and bed alarm set and call bell in reach.   Therapy Documentation Precautions:  Precautions Precautions: Fall, Back Precaution Comments:   Restrictions Weight Bearing Restrictions: No Other Position/Activity Restrictions: T3, T6 - T 10 path Fx Pain: Pain Assessment Pain Score: 7  Pain Type: Acute pain Pain Location: Back Pain Orientation: Medial;Lower Pain Descriptors / Indicators: Aching;Sharp Pain Onset: On-going Pain  Intervention(s): Repositioned;Ambulation/increased activity    Therapy/Group: Individual Therapy  Reginia Naas  Magda Kiel, PT 09/19/2018, 9:59 AM

## 2018-09-20 ENCOUNTER — Inpatient Hospital Stay (HOSPITAL_COMMUNITY): Payer: 59 | Admitting: Physical Therapy

## 2018-09-20 ENCOUNTER — Inpatient Hospital Stay (HOSPITAL_COMMUNITY): Payer: 59

## 2018-09-20 ENCOUNTER — Inpatient Hospital Stay (HOSPITAL_COMMUNITY): Payer: 59 | Admitting: Occupational Therapy

## 2018-09-20 DIAGNOSIS — R7309 Other abnormal glucose: Secondary | ICD-10-CM

## 2018-09-20 LAB — GLUCOSE, CAPILLARY
Glucose-Capillary: 175 mg/dL — ABNORMAL HIGH (ref 70–99)
Glucose-Capillary: 206 mg/dL — ABNORMAL HIGH (ref 70–99)
Glucose-Capillary: 186 mg/dL — ABNORMAL HIGH (ref 70–99)
Glucose-Capillary: 184 mg/dL — ABNORMAL HIGH (ref 70–99)

## 2018-09-20 NOTE — Progress Notes (Signed)
Social Work Patient ID: Edward Todd, male   DOB: [birth year], 43 y.o.   MRN: [id number]  Have reviewed team conference with pt and wife.  Both pleased with progress and aware team continues to aim toward 6/3 discharge.  Wife to let us know which day will be best for family ed.  Review of DME and follow up with both.  Will also assist with SSD application.  [name], LCSW

## 2018-09-20 NOTE — Progress Notes (Signed)
Edward Todd PHYSICAL MEDICINE & REHABILITATION PROGRESS NOTE  Subjective/Complaints: Patient seen laying in bed this morning.  He states he slept well overnight.  He states he feels good this morning.  He is question about his discharge date.  ROS: Denies CP, shortness of breath, nausea, vomiting, diarrhea.  Objective: Vital Signs: Blood pressure 121/85, pulse 89, temperature (!) 97.5 F (36.4 C), resp. rate 18, height 6' 3.51" (1.918 m), weight 130.7 kg, SpO2 96 %. No results found. Recent Labs    09/19/18 0418  WBC 5.0  HGB 8.2*  HCT 25.1*  PLT 371   Recent Labs    09/19/18 0418  NA 131*  K 4.6  CL 99  CO2 21*  GLUCOSE 99  BUN 29*  CREATININE 1.32*  CALCIUM 9.1    Physical Exam: BP 121/85 (BP Location: Left Arm)   Pulse 89   Temp (!) 97.5 F (36.4 C)   Resp 18   Ht 6' 3.51" (1.918 m)   Wt 130.7 kg   SpO2 96%   BMI 35.53 kg/m  Constitutional: No distress . Vital signs reviewed. HENT: Normocephalic.  Atraumatic. Eyes: EOMI.  No discharge. Cardiovascular: No JVD. Respiratory: Normal effort. GI: Non-distended. Musc: No edema or tenderness in extremities, stable Neurological: Alert and oriented Good awareness of deficits.  Motor: 4/5 throughout (pain inhibition)  Skin: Skin iswarmand dry.  Psychiatric: He has anormal mood and affect. Hisbehavior is normal.Judgmentand thought contentnormal.   Assessment/Plan: 1. Functional deficits secondary to multiple myeloma which require 3+ hours per day of interdisciplinary therapy in a comprehensive inpatient rehab setting.  Physiatrist is providing close team supervision and 24 hour management of active medical problems listed below.  Physiatrist and rehab team continue to assess barriers to discharge/monitor patient progress toward functional and medical goals  Care Tool:  Bathing  Bathing activity did not occur: Safety/medical concerns Body parts bathed by patient: Right arm, Left arm, Abdomen, Chest,  Front perineal area, Buttocks, Right upper leg, Left upper leg, Right lower leg, Left lower leg, Face         Bathing assist Assist Level: Minimal Assistance - Patient > 75%     Upper Body Dressing/Undressing Upper body dressing Upper body dressing/undressing activity did not occur (including orthotics): Safety/medical concerns What is the patient wearing?: Pull over shirt    Upper body assist Assist Level: Supervision/Verbal cueing    Lower Body Dressing/Undressing Lower body dressing    Lower body dressing activity did not occur: Safety/medical concerns What is the patient wearing?: Underwear/pull up, Pants     Lower body assist Assist for lower body dressing: Minimal Assistance - Patient > 75%(reacher)     Toileting Toileting    Toileting assist Assist for toileting: Minimal Assistance - Patient > 75% Assistive Device Comment: of urinal per RN report    Transfers Chair/bed transfer  Transfers assist  Chair/bed transfer activity did not occur: Safety/medical concerns  Chair/bed transfer assist level: Contact Guard/Touching assist     Locomotion Ambulation   Ambulation assist   Ambulation activity did not occur: Safety/medical concerns  Assist level: Contact Guard/Touching assist Assistive device: Walker-rolling Max distance: 50'   Walk 10 feet activity   Assist  Walk 10 feet activity did not occur: Safety/medical concerns  Assist level: Contact Guard/Touching assist Assistive device: Walker-rolling   Walk 50 feet activity   Assist Walk 50 feet with 2 turns activity did not occur: Safety/medical concerns  Assist level: Contact Guard/Touching assist Assistive device: Walker-rolling    Walk  150 feet activity   Assist Walk 150 feet activity did not occur: Safety/medical concerns         Walk 10 feet on uneven surface  activity   Assist Walk 10 feet on uneven surfaces activity did not occur: Safety/medical concerns          Wheelchair     Assist Will patient use wheelchair at discharge?: Yes Type of Wheelchair: Manual Wheelchair activity did not occur: Safety/medical concerns(low hemoglobin/fatigue and pain/back spasms)  Wheelchair assist level: Supervision/Verbal cueing Max wheelchair distance: 150'    Wheelchair 50 feet with 2 turns activity    Assist    Wheelchair 50 feet with 2 turns activity did not occur: Safety/medical concerns   Assist Level: Supervision/Verbal cueing   Wheelchair 150 feet activity     Assist Wheelchair 150 feet activity did not occur: Safety/medical concerns   Assist Level: Supervision/Verbal cueing      Medical Problem List and Plan: 1.Decreased functional mobilitysecondary to newly diagnosed multiple myeloma.  Continue CIR  Weekly Velcade and dexamethasone initiated 09/05/2018 per Heme/Onc 2. Antithrombotics: -DVT/anticoagulation:Subcutaneous Lovenox -antiplatelet therapy: Aspirin 81 mg daily 3. Pain Management:Oxycodone as needed -scheduled flexeril TID, DC'd on 5/25  Robaxin-750 3 times daily scheduled on 5/25 -improved -kpad prn for spasms 4. Mood:Provide emotional support -antipsychotic agents: N/A 5. Neuropsych: This patientiscapable of making decisions on hisown behalf. 6. Skin/Wound Care:Routine skin checks 7. Fluids/Electrolytes/Nutrition:Routine in and outs   Remeron started 5/18- improved appetite 8.Acute on chronic anemia. Follow-up hematology oncology services  Hemoglobin 8.2 on 5/27 after transfusion on 5/17  Hemoccult negative  Recs per Heme/Onc 9. Diabetes mellitus. Hemoglobin A1c 9.4. Provide education  Metformin 500 twice daily, increased to 1000 twice daily on 5/21  Had been relatively controlled, however very labile over the last 4 hours. 10. Morbid obesity. BMI 35.53. Dietary follow-up 11. Constipation. Laxative assistance 12.  Hyponatremia  Sodium 131  on 5/27  Continue to monitor 13.  Fevers: Resolved  Completed 5-day course of IV Levaquin  14.  Sleep disturbance  Likely exacerbated by steroids  No benefit with Ambien  Remeron started on 5/18  Improved 15.  Elevated diastolic blood pressure  Continue to monitor  Controlled on 5/28 17.  AKI  Creatinine 1.32 on 5/27  Encourage fluids  LOS: 13 days A FACE TO FACE EVALUATION WAS PERFORMED  Edward Todd Edward Todd 09/20/2018, 9:16 AM

## 2018-09-20 NOTE — Plan of Care (Signed)
  Problem: RH BOWEL ELIMINATION Goal: RH STG MANAGE BOWEL W/MEDICATION W/ASSISTANCE Description STG Manage Bowel with Medication Min with Assistance.  Outcome: Progressing Flowsheets (Taken 09/20/2018 1440) STG: Pt will manage bowels with medication with assistance: 4-Minimal assistance   Problem: RH SKIN INTEGRITY Goal: RH STG SKIN FREE OF INFECTION/BREAKDOWN Description Patient free from skin breakdown during admission  Outcome: Progressing Goal: RH STG MAINTAIN SKIN INTEGRITY WITH ASSISTANCE Description STG Maintain Skin Integrity With [place].  Outcome: Progressing Flowsheets (Taken 09/20/2018 1440) STG: Maintain skin integrity with assistance: 4-Minimal assistance   Problem: RH SAFETY Goal: RH STG ADHERE TO SAFETY PRECAUTIONS W/ASSISTANCE/DEVICE Description STG Adhere to Safety Precautions With Min Assistance/Device.  Outcome: Progressing Flowsheets (Taken 09/20/2018 1440) STG:Pt will adhere to safety precautions with assistance/device: 4-Minimal assistance   Problem: RH PAIN MANAGEMENT Goal: RH STG PAIN MANAGED AT OR BELOW PT'S PAIN GOAL Description Pain free or pain less than 3 during admission  Outcome: Progressing   Problem: RH KNOWLEDGE DEFICIT GENERAL Goal: RH STG INCREASE KNOWLEDGE OF SELF CARE AFTER HOSPITALIZATION Description Pt will be able to state/recall  wo things regarding self care for the immunocompromised individual  Outcome: Progressing

## 2018-09-20 NOTE — Progress Notes (Signed)
Physical Therapy Session Note  Patient Details  Name: Edward Todd MRN: [id number] Date of Birth: 04-06-1976  Today's Date: 09/20/2018 PT Individual Time: 0901-0950 PT Individual Time Calculation (min): 49 min   Short Term Goals: Week 2:  PT Short Term Goal 1 (Week 2): Pt will initiate step/stair training PT Short Term Goal 2 (Week 2): Pt will transfer supine>sit and stand pivot transfers within 5 minutes, 75% of the time PT Short Term Goal 3 (Week 2): Pt will participate in 60 min of OOB activity w/o increase in fatigue   Skilled Therapeutic Interventions/Progress Updates:    Patient received sitting EOB with B UE support at bedside vs bedside table. Patient performing sit to stand at bedside with RW with elevated bed height with CGA. Patient required 3 minutes static standing time prior to initiating forward movement. Ambulating in hallway with RW with CGA for 200 feet total - increased time to complete tasks with patient taking multiple standing rest breaks. PT providing cueing for step length, posturing, as well as increasing BOS for improved stability. During standing rest breaks, patient able to statically stand without UE support with CGA at B hips.   Patient performing standing reaching activities to place cards at chest level (9 reps x2) for improved dynamic balance needed for functional mobility - requires intermittent UE support at RW and CGA, however patient very motivated as he was unable to release hands prior to this. Cueing for forward weight shifting and posturing with good carryover. Patient assisted back to room with manual w/c - patient able to use B LE to steer and navigate with focus on improving HS strength with good tolerance. Patient in w/c with all needs met at end of session.   Therapy Documentation Precautions:  Precautions Precautions: Fall, Back Precaution Comments:   Restrictions Weight Bearing Restrictions: No Other Position/Activity Restrictions: T3, T6  - T 10 path Fx Pain: Pain Assessment Pain Scale: 0-10 Pain Score: 2  Pain Type: Acute pain Pain Location: Back Pain Intervention(s): Medication (See eMAR);Repositioned    Therapy/Group: Individual Therapy   Lanney Gins, PT, DPT Supplemental Physical Therapist 09/20/18 12:07 PM Pager: [phone] Office: [phone]

## 2018-09-20 NOTE — Progress Notes (Signed)
Occupational Therapy Session Note  Patient Details  Name: Edward Todd MRN: [id number] Date of Birth: 07-Dec-1975  Today's Date: 09/20/2018 OT Individual Time: 0835-0900 OT Individual Time Calculation (min): 25 min  and Today's Date: 09/20/2018 OT Missed Time: 20 Minutes Missed Time Reason: Patient unwilling/refused to participate without medical reason("talking on the phone with mother")   Short Term Goals: Week 2:  OT Short Term Goal 1 (Week 2): Patient will complete bed mobility and functional transfers with min A OT Short Term Goal 2 (Week 2): patient will tolerate OOB for 1 hour at a time and full therapy schedule OT Short Term Goal 3 (Week 2): Patient will complete UB bathing and dressing with min A, LB bathing and dressing with mod A  Skilled Therapeutic Interventions/Progress Updates:    Pt greeted supine I bed talking on the phone. Pt states he is talking on the phone with his mother and needs to update her on his progress-requests OT to return later. Pt missed 20 mins of OT treatment session. OT returned, and pt already seated EOB. Pt discusses his progress with OT and feels encouraged having 3 good days where his pain is better managed and he can move more freely with less pain. OT discussed shower goal with pt and educated on technique to shower using tub bench for energy conservation. Pt concerned with covering PICC line in R UE. OT educated on ways we ensure PICC stays clean and dry including "seal skin" and water proof devices. Pt does not want to "chance it." Pt needed max A to don shoes seated EOB. Educated on use of elastic shoe laces as well and will try those next session. Pt declined to get to wc and wanted to wait for PT session that was coming next. Pt left seated EOB with needs met.   Therapy Documentation Precautions:  Precautions Precautions: Fall, Back Precaution Comments:   Restrictions Weight Bearing Restrictions: No Other Position/Activity Restrictions: T3,  T6 - T 10 path Fx General: General OT Amount of Missed Time: 20 Minutes Pain: Pain Assessment Pain Scale: 0-10 Pain Score: 2  Pain Type: Acute pain Pain Location: Back Pain Intervention(s): Repositioned Other Treatments:     Therapy/Group: Individual Therapy  Valma Cava 09/20/2018, 9:12 AM

## 2018-09-20 NOTE — Progress Notes (Signed)
Patient rested fairly well throughout the night. Medicated x2 with oxycodone for back pain with positive effects noted. Pt noted to be tearful this am. Reports that he had just finished praying this am.

## 2018-09-20 NOTE — Progress Notes (Signed)
Physical Therapy Session Note  Patient Details  Name: Edward Todd MRN: [id number] Date of Birth: 11/04/1975  Today's Date: 09/20/2018 PT Individual Time: 1245-1430 PT Individual Time Calculation (min): 105 min   Short Term Goals: Week 2:  PT Short Term Goal 1 (Week 2): Pt will initiate step/stair training PT Short Term Goal 2 (Week 2): Pt will transfer supine>sit and stand pivot transfers within 5 minutes, 75% of the time PT Short Term Goal 3 (Week 2): Pt will participate in 60 min of OOB activity w/o increase in fatigue   Skilled Therapeutic Interventions/Progress Updates:   Missed 15 min of skilled PT at beginning of session 2/2 talking on phone and asking therapist to return later. Pt in w/c upon return and agreeable to therapy, pain as detailed below. Per pt, he has been having multiple "good" days in a row, had prolonged discussion regarding what makes a "good" day in his case including pain management, energy conservation, and mental/emotional motivation strategies. Requesting to toilet and wash up this session. Ambulated to/from toilet w/ close supervision w/ RW, supervision for sit<>stands this session. Pt also moving through motions much more fluid this session. Pt w/ continent BM, min assist for LE garment management at toilet and total assist for thoroughness of pericare. Set-up washing/bathing from seated level at sink, per pt he states he will likely to it this way upon d/c w/ his PICC line still in 2/2 his comfort level. Pt performed all bathing and dressing w/ set-up assist, preferred to just wash upper body and head today - states he wants to try a shower tomorrow. Continues to require significantly increased amount time for all mobility 2/2 pain and guarding movements. Ambulated back to EOB w/ close supervision. Ended session sitting EOB independently, all needs in reach.   Therapy Documentation Precautions:  Precautions Precautions: Fall, Back Precaution Comments:    Restrictions Weight Bearing Restrictions: No Other Position/Activity Restrictions: T3, T6 - T 10 path Fx Pain: Pain Assessment Pain Scale: 0-10 Pain Score: 4  Pain Type: Acute pain Pain Location: Back Pain Descriptors / Indicators: Aching Pain Frequency: Intermittent Pain Intervention(s): Medication (See eMAR);Repositioned  Therapy/Group: Individual Therapy  Cameren Earnest [name] 09/20/2018, 2:31 PM

## 2018-09-20 NOTE — Patient Care Conference (Signed)
Inpatient RehabilitationTeam Conference and Plan of Care Update Date: 09/19/2018   Time: 2:40 PM    Patient Name: Edward Todd      Medical Record Number: [id number]  Date of Birth: 11/05/1975 Sex: Male         Room/Bed: 4M10C/4M10C-01 Payor Info: Payor: [employer] / Plan: [employer] NAP / Product Type: *No Product type* /    Admitting Diagnosis: ABI Team  Debility, multipe myeloma; 15-17days  Admit Date/Time:  09/07/2018  3:41 PM Admission Comments: No comment available   Primary Diagnosis:  <principal problem not specified> Principal Problem: <principal problem not specified>  Patient Active Problem List   Diagnosis Date Noted  . Labile blood glucose   . AKI (acute kidney injury) ([place])   . Muscle spasm   . Diabetes mellitus type 2 in nonobese ([facility])   . Elevated blood pressure reading   . Hyponatremia   . Diabetes mellitus type 2 in obese ([place])   . Acute on chronic anemia   . Malaise   . FUO (fever of unknown origin)   . Sleep disturbance   . HTN (hypertension) 09/05/2018  . Type 2 diabetes mellitus without complication ([place]) 04/09/2445  . Hypophosphatemia 09/05/2018  . Constipation 09/05/2018  . Anemia of chronic disease 09/05/2018  . Multiple myeloma ([place])   . Symptomatic anemia 08/30/2018  . Acute bilateral low back pain   . Weakness of both lower extremities     Expected Discharge Date: Expected Discharge Date: 09/26/18  Team Members Present: Physician leading conference: Dr. Delice Lesch Social Worker Present: Lennart Pall, LCSW Nurse Present: Rayetta Pigg, RN PT Present: Burnard Bunting, PT OT Present: Willeen Cass, OT SLP Present: Weston Anna, SLP     Current Status/Progress Goal Weekly Team Focus  Medical   Decreased functional mobility secondary to newly diagnosed multiple myeloma.   Improve mobility, sleep, pain, AKI  See above   Bowel/Bladder   Continent of bowel and bladder LBM-09/18/18  Maintain regular bowel and urinary pattern  Assist with toileting  needs prn   Swallow/Nutrition/ Hydration             ADL's   Max A when in pain, if pain is managed, can be as littlw as min A for BADLs- inconsistent  CGA/Min A  Pain management, LB/UB strengthening, self-care retraining, pt/family education, activity tolerance   Mobility   CGA-min assist overall, gait 50-150' w/ RW, continues to move very slowly and guarded   supervision-CGA overall household gait  pain management and positioning techniques, curb/stair negotiation, LE endurance and strengthening    Communication             Safety/Cognition/ Behavioral Observations            Pain   C/o pain to mid/lower back pain-managed with prn oxycodone, Kpad, and scheduled robaxin  Pain level <4/10  Assess pain every shift and treat as ordered   Skin   no skin issues  maintain skin integrity  Assess skin every shift and as needed    Rehab Goals Patient on target to meet rehab goals: Yes *See Care Plan and progress notes for long and short-term goals.     Barriers to Discharge  Current Status/Progress Possible Resolutions Date Resolved   Physician    Medical stability;Pending chemo/radiation     See above  Therapies, sleep/appetite/pain improving, encourage fluids      Nursing                  PT  OT                  SLP                SW                Discharge Planning/Teaching Needs:  Pt to return home with wife and children.  Wife able to provide 24/7 assistance.  Will begin speaking with wife about education times.   Team Discussion:  Still struggles with back pain but better control with Robaxin.  Encouraging fluids.  Watch mood - neuropsych following.  CG amb ~ 100' if low pain.  Can be as good as min assist with ADLs.  Supervision to min assist goals overall.    Revisions to Treatment Plan:  NA    Continued Need for Acute Rehabilitation Level of Care: The patient requires daily medical management by a physician with specialized training in physical  medicine and rehabilitation for the following conditions: Daily direction of a multidisciplinary physical rehabilitation program to ensure safe treatment while eliciting the highest outcome that is of practical value to the patient.: Yes Daily medical management of patient stability for increased activity during participation in an intensive rehabilitation regime.: Yes Daily analysis of laboratory values and/or radiology reports with any subsequent need for medication adjustment of medical intervention for : Other;Neurological problems;Renal problems   I attest that I was present, lead the team conference, and concur with the assessment and plan of the team.   Lennart Pall 09/20/2018, 2:58 PM    Team conference was held via web/ teleconference due to [employer] - 19

## 2018-09-21 ENCOUNTER — Inpatient Hospital Stay (HOSPITAL_COMMUNITY): Payer: 59 | Admitting: Occupational Therapy

## 2018-09-21 ENCOUNTER — Inpatient Hospital Stay (HOSPITAL_COMMUNITY): Payer: 59 | Admitting: Physical Therapy

## 2018-09-21 LAB — GLUCOSE, CAPILLARY
Glucose-Capillary: 170 mg/dL — ABNORMAL HIGH (ref 70–99)
Glucose-Capillary: 135 mg/dL — ABNORMAL HIGH (ref 70–99)
Glucose-Capillary: 195 mg/dL — ABNORMAL HIGH (ref 70–99)
Glucose-Capillary: 160 mg/dL — ABNORMAL HIGH (ref 70–99)

## 2018-09-21 NOTE — Progress Notes (Signed)
  Patient ID: Sayvion Vigen, male   DOB: 03-Nov-1975, 43 y.o.   MRN: [id number]       Diagnosis codes:  C90.00;  R53.81;  M48.061  Height:  6'3"              Weight:    288 lbs        Patient suffers from multiple myeloma, spinal stenosis and debility which impairs their ability to perform daily activities like dressing, bathing and mobility in the home.  A rolling walker will not resolve issue with performing activities of daily living.  A wheelchair will allow patient to safely perform daily activities.  Patient is not able to propel themselves in the home using a standard weight wheelchair due to general weakness and pain.  Patient can self propel in the lightweight wheelchair.   Lauraine Rinne, PA-C

## 2018-09-21 NOTE — Progress Notes (Signed)
Social Work Patient ID: Rigby Leonhardt, male   DOB: 1976-03-28, 43 y.o.   MRN: [id number]   Per MD, tx team and pt/ wife agreeable, have changed d/c date to 6/2 after the completion of family ed that day.  Montarius Kitagawa, LCSW

## 2018-09-21 NOTE — Progress Notes (Signed)
Edward Todd   DOB:1975/09/08   HD#:[id number]   QJJ#:[id number]  Hem/onc follow up   Subjective: Pt is doing well overall. He was upset last night because he did not have the plastic cap outside the thermometer on when he was checked for oral temperature last night, he is concerned about potential infection from that. He is afebrile, no new complains.    Objective:  Vitals:   09/20/18 2020 09/21/18 0640  BP: (!) 146/86 132/85  Pulse:  96  Resp:  20  Temp:  (!) 97.5 F (36.4 C)  SpO2:  98%    Body mass index is 35.53 kg/m.  Intake/Output Summary (Last 24 hours) at 09/21/2018 0752 Last data filed at 09/21/2018 0651 Gross per 24 hour  Intake 642 ml  Output 2550 ml  Net -1908 ml     Sclerae unicteric  No peripheral adenopathy  Lungs clear -- no rales or rhonchi  Heart regular rate and rhythm  Abdomen benign  Neuro nonfocal   CBG (last 3)  Recent Labs    09/20/18 1738 09/20/18 2121 09/21/18 0702  GLUCAP 186* 184* 170*     Labs:   Urine Studies No results for input(s): UHGB, CRYS in the last 72 hours.  Invalid input(s): UACOL, UAPR, USPG, UPH, UTP, UGL, UKET, UBIL, UNIT, UROB, ULEU, UEPI, UWBC, URBC, UBAC, CAST, UCOM, BILUA  Basic Metabolic Panel: Recent Labs  Lab 09/17/18 0330 09/19/18 0418  NA 131* 131*  K 4.1 4.6  CL 99 99  CO2 25 21*  GLUCOSE 106* 99  BUN 20 29*  CREATININE 1.19 1.32*  CALCIUM 8.6* 9.1   GFR Estimated Creatinine Clearance: 104.7 mL/min (A) (by C-G formula based on SCr of 1.32 mg/dL (H)). Liver Function Tests: Recent Labs  Lab 09/19/18 0418  AST 26  ALT 42  ALKPHOS 65  BILITOT 0.6  PROT 11.2*  ALBUMIN 2.6*   No results for input(s): LIPASE, AMYLASE in the last 168 hours. No results for input(s): AMMONIA in the last 168 hours. Coagulation profile No results for input(s): INR, PROTIME in the last 168 hours.  CBC: Recent Labs  Lab 09/17/18 0330 09/19/18 0418  WBC 4.4 5.0  NEUTROABS 2.4 2.9  HGB 7.9* 8.2*  HCT  23.8* 25.1*  MCV 90.5 91.6  PLT 326 371   Cardiac Enzymes: No results for input(s): CKTOTAL, CKMB, CKMBINDEX, TROPONINI in the last 168 hours. BNP: Invalid input(s): POCBNP CBG: Recent Labs  Lab 09/20/18 0622 09/20/18 1133 09/20/18 1738 09/20/18 2121 09/21/18 0702  GLUCAP 175* 206* 186* 184* 170*   D-Dimer No results for input(s): DDIMER in the last 72 hours. Hgb A1c No results for input(s): HGBA1C in the last 72 hours. Lipid Profile No results for input(s): CHOL, HDL, LDLCALC, TRIG, CHOLHDL, LDLDIRECT in the last 72 hours. Thyroid function studies No results for input(s): TSH, T4TOTAL, T3FREE, THYROIDAB in the last 72 hours.  Invalid input(s): FREET3 Anemia work up No results for input(s): VITAMINB12, FOLATE, FERRITIN, TIBC, IRON, RETICCTPCT in the last 72 hours. Microbiology No results found for this or any previous visit (from the past 240 hour(s)).    Studies:  No results found.  Assessment: 43 y.o. African-American male, without past medical history, presented with debilitating low back pain,and moderate anemia.  1.Multiple myeloma,IgG Kappa,stage III 2.Moderate anemia, worsening after chemo, 1u PRBC on 5/17 3.Type 2 diabetes 4. Severe low back pain, from thoracic and lumbar compression fracture, lumbar spine stenosis; improving 5. Neutropenia and fever, resolved now  6. Hyponatremia  7. Hypocalcemia    Plan:  -continue rehab -I explained the cytogenetics fish test results from his previous bone marrow biopsy to patient, which showed gain of 11q13, other MM FISH panel were negative, so he has standard risk disease, which is good news.  -per pt, he will be discharged home next Wednesday, so I will set up his oupt F/U with me and Velcade injection next Thursday 6/4, no treatment on Wednesday 6/3 then.  -I encouraged pt to drink more fluids, his Cr was slightly elevated 2 days ago    Truitt Merle, MD 09/21/2018  7:52 AM

## 2018-09-21 NOTE — Progress Notes (Addendum)
Physical Therapy Session Note  Patient Details  Name: Edward Todd MRN: [id number] Date of Birth: 09-Oct-1975  Today's Date: 09/21/2018 PT Individual Time: 0800-0855 AND 1345-1500  PT Individual Time Calculation (min): 55 min AND 75 min  Short Term Goals: Week 2:  PT Short Term Goal 1 (Week 2): Pt will initiate step/stair training PT Short Term Goal 2 (Week 2): Pt will transfer supine>sit and stand pivot transfers within 5 minutes, 75% of the time PT Short Term Goal 3 (Week 2): Pt will participate in 60 min of OOB activity w/o increase in fatigue   Skilled Therapeutic Interventions/Progress Updates:   Session 1:  Pt in supine and agreeable to therapy, pain 7/10 and premedicated. Supine>sit w/ supervision and increased time. Pt requesting to d/c after therapies on Monday. Discussed that his biggest barrier is the 1-step curb/threshold to access house. Brought curb to room to practice. Pt very fearful of this but motivated by the possibility of going sooner. Sit>stand w/ supervision and turned to negotiated curb backwards w/ RW, supervision as well. Visual cues for technique and discussed option of going backwards as a safer and oftentimes more confident for pt to negotiate it this way. Continues to need significantly increased time for all mobility, but moves safely and w/o any physical assistance. Ended session in w/c, all needs in reach.   Session 2:  Pt in w/c and agreeable to therapy, pain the same as detailed above. Pt self-propelled w/c to day room using BUEs to work on endurance training. Increased time, however pt able to perform w/o rest breaks. Stand pivot transfer to NuStep w/ supervision. Performed NuStep 12 min @ level 3 w/ all extremities to work on Engineer, production. Ambulated back to room w/ RW, ~250' w/ close supervision and no standing rest breaks needed. Had prolonged discussion of energy conservation strategies today as it relates to his multiple myeloma diagnosis. Also  discussed importance of support groups. Provided w/ handouts of information discussed. Pt needed prolonged seated rest breaks in between all movements and exercise this session 2/2 fatigue and increased work of breathing. Ended session sitting EOB, independently, all needs in reach.   Therapy Documentation Precautions:  Precautions Precautions: Fall, Back Precaution Comments:   Restrictions Weight Bearing Restrictions: No Other Position/Activity Restrictions: T3, T6 - T 10 path Fx  Therapy/Group: Individual Therapy  Shai Mckenzie [name] 09/21/2018, 3:15 PM

## 2018-09-21 NOTE — Progress Notes (Signed)
Occupational Therapy Session Note  Patient Details  Name: Edward Todd MRN: [id number] Date of Birth: 02/21/1976  Today's Date: 09/21/2018 OT Individual Time: 9628-3662 OT Individual Time Calculation (min): 71 min   Short Term Goals: Week 2:  OT Short Term Goal 1 (Week 2): Patient will complete bed mobility and functional transfers with min A OT Short Term Goal 2 (Week 2): patient will tolerate OOB for 1 hour at a time and full therapy schedule OT Short Term Goal 3 (Week 2): Patient will complete UB bathing and dressing with min A, LB bathing and dressing with mod A  Skilled Therapeutic Interventions/Progress Updates:    Pt greeted sitting in wc and agreeable to OT treatment session focused on self-care retraining. Pt brought to therapy apartment and practiced tub bench transfers in simulated home environment. Pt practiced on regular tub bench, and felt this was too low and too flimsy for him. Tried the bariatric tub bench with pt feeling much more comfortable with transfer. Pt did need assistance to swing BLEs over large tub ledge, but reports his tub ledge is shorter. Pt returned to room and worked on bathing/dressing tasks at the sink. Multiple sit<>stands with CGA/supervision. Pt able to remove unilateral UE from the sink to alternate and wash peri-area and buttocks today. OT educated on modified technique to don pants using figure 4 position and reacher. OT then educated on use of sock-aid to don socks with pt demonstrating understanding. OT then changed out shoe laces for elastic laces and educated on donning shoes. Pt left seated in wc at the sink finishing UB bathing and dressing.   Therapy Documentation Precautions:  Precautions Precautions: Fall, Back Precaution Comments:   Restrictions Weight Bearing Restrictions: No Other Position/Activity Restrictions: T3, T6 - T 10 path Fx Pain: Pain Assessment Pain Scale: 0-10 Pain Score: 3  Pain Type: Acute pain Pain Location:  Generalized Pain Descriptors / Indicators: Aching;Discomfort Patients Stated Pain Goal: 3 Pain Intervention(s): Repositioned;Emotional support   Therapy/Group: Individual Therapy  Valma Cava 09/21/2018, 10:43 AM

## 2018-09-21 NOTE — Progress Notes (Signed)
[place] PHYSICAL MEDICINE & REHABILITATION PROGRESS NOTE  Subjective/Complaints: Patient seen laying in bed this morning.  He states he slept well overnight.  He is upset regarding an issue with taking his temperature yesterday.  Discussed with patient.  Discussed with nursing as well.  Patient wants to know if he can be discharged on Monday.  ROS: Denies CP, shortness of breath, nausea, vomiting, diarrhea.  Objective: Vital Signs: Blood pressure 132/85, pulse 96, temperature (!) 97.5 F (36.4 C), temperature source Oral, resp. rate 20, height 6' 3.51" (1.918 m), weight 130.7 kg, SpO2 98 %. No results found. Recent Labs    09/19/18 0418  WBC 5.0  HGB 8.2*  HCT 25.1*  PLT 371   Recent Labs    09/19/18 0418  NA 131*  K 4.6  CL 99  CO2 21*  GLUCOSE 99  BUN 29*  CREATININE 1.32*  CALCIUM 9.1    Physical Exam: BP 132/85 (BP Location: Left Arm)   Pulse 96   Temp (!) 97.5 F (36.4 C) (Oral)   Resp 20   Ht 6' 3.51" (1.918 m)   Wt 130.7 kg   SpO2 98%   BMI 35.53 kg/m  Constitutional: No distress . Vital signs reviewed. HENT: Normocephalic.  Atraumatic. Eyes: EOMI.  No discharge. Cardiovascular: No JVD. Respiratory: Normal effort. GI: Non-distended. Musc: No edema or tenderness in extremities, unchanged Neurological: Alert and oriented Good awareness of deficits.  Motor: 4/5 throughout (pain inhibition), unchanged Skin: Skin iswarmand dry.  Psychiatric: He has anormal mood and affect. Hisbehavior is normal.Judgmentand thought contentnormal.   Assessment/Plan: 1. Functional deficits secondary to multiple myeloma which require 3+ hours per day of interdisciplinary therapy in a comprehensive inpatient rehab setting.  Physiatrist is providing close team supervision and 24 hour management of active medical problems listed below.  Physiatrist and rehab team continue to assess barriers to discharge/monitor patient progress toward functional and medical  goals  Care Tool:  Bathing  Bathing activity did not occur: Safety/medical concerns Body parts bathed by patient: Right arm, Left arm, Abdomen, Chest, Front perineal area, Buttocks, Right upper leg, Left upper leg, Right lower leg, Left lower leg, Face         Bathing assist Assist Level: Minimal Assistance - Patient > 75%     Upper Body Dressing/Undressing Upper body dressing Upper body dressing/undressing activity did not occur (including orthotics): Safety/medical concerns What is the patient wearing?: Pull over shirt    Upper body assist Assist Level: Supervision/Verbal cueing    Lower Body Dressing/Undressing Lower body dressing    Lower body dressing activity did not occur: Safety/medical concerns What is the patient wearing?: Underwear/pull up, Pants     Lower body assist Assist for lower body dressing: Minimal Assistance - Patient > 75%(reacher)     Toileting Toileting    Toileting assist Assist for toileting: Minimal Assistance - Patient > 75% Assistive Device Comment: of urinal per RN report    Transfers Chair/bed transfer  Transfers assist  Chair/bed transfer activity did not occur: Safety/medical concerns  Chair/bed transfer assist level: Contact Guard/Touching assist     Locomotion Ambulation   Ambulation assist   Ambulation activity did not occur: Safety/medical concerns  Assist level: Supervision/Verbal cueing Assistive device: Walker-rolling Max distance: 15'   Walk 10 feet activity   Assist  Walk 10 feet activity did not occur: Safety/medical concerns  Assist level: Supervision/Verbal cueing Assistive device: Walker-rolling   Walk 50 feet activity   Assist Walk 50 feet with 2  turns activity did not occur: Safety/medical concerns  Assist level: Contact Guard/Touching assist Assistive device: Walker-rolling    Walk 150 feet activity   Assist Walk 150 feet activity did not occur: Safety/medical concerns  Assist level:  Contact Guard/Touching assist Assistive device: Walker-rolling    Walk 10 feet on uneven surface  activity   Assist Walk 10 feet on uneven surfaces activity did not occur: Safety/medical concerns         Wheelchair     Assist Will patient use wheelchair at discharge?: Yes Type of Wheelchair: Manual Wheelchair activity did not occur: Safety/medical concerns(low hemoglobin/fatigue and pain/back spasms)  Wheelchair assist level: Supervision/Verbal cueing Max wheelchair distance: 150    Wheelchair 50 feet with 2 turns activity    Assist    Wheelchair 50 feet with 2 turns activity did not occur: Safety/medical concerns   Assist Level: Supervision/Verbal cueing   Wheelchair 150 feet activity     Assist Wheelchair 150 feet activity did not occur: Safety/medical concerns   Assist Level: Supervision/Verbal cueing      Medical Problem List and Plan: 1.Decreased functional mobilitysecondary to newly diagnosed multiple myeloma.  Continue CIR  Weekly Velcade and dexamethasone initiated 09/05/2018 per Heme/Onc 2. Antithrombotics: -DVT/anticoagulation:Subcutaneous Lovenox -antiplatelet therapy: Aspirin 81 mg daily 3. Pain Management:Oxycodone as needed -scheduled flexeril TID, DC'd on 5/25  Robaxin-750 3 times daily scheduled on 5/25 -improved -kpad prn for spasms 4. Mood:Provide emotional support -antipsychotic agents: N/A 5. Neuropsych: This patientiscapable of making decisions on hisown behalf. 6. Skin/Wound Care:Routine skin checks 7. Fluids/Electrolytes/Nutrition:Routine in and outs   Remeron started 5/18- improved appetite 8.Acute on chronic anemia. Follow-up hematology oncology services  Hemoglobin 8.2 on 5/27 after transfusion on 5/17  Hemoccult negative  Recs per Heme/Onc 9. Diabetes mellitus. Hemoglobin A1c 9.4. Provide education  Metformin 500 twice daily, increased to 1000 twice  daily on 5/21  ?  Trending up, will consider medication adjustments tomorrow if persistently elevated 10. Morbid obesity. BMI 35.53. Dietary follow-up 11. Constipation. Laxative assistance 12.  Hyponatremia  Sodium 131 on 5/27  Continue to monitor 13.  Fevers: Resolved  Completed 5-day course of IV Levaquin  14.  Sleep disturbance  Likely exacerbated by steroids  No benefit with Ambien  Remeron started on 5/18  Improved 15.  Elevated blood pressure  Continue to monitor  Labile on 5/29 17.  AKI  Creatinine 1.32 on 5/27  Encourage fluids  LOS: 14 days A FACE TO FACE EVALUATION WAS PERFORMED  Ankit [name] 09/21/2018, 9:06 AM

## 2018-09-21 NOTE — Plan of Care (Signed)
  Problem: RH BOWEL ELIMINATION Goal: RH STG MANAGE BOWEL W/MEDICATION W/ASSISTANCE Description STG Manage Bowel with Medication Min with Assistance.  Outcome: Progressing Flowsheets (Taken 09/21/2018 1237) STG: Pt will manage bowels with medication with assistance: 5-Supervision/cueing   Problem: RH SKIN INTEGRITY Goal: RH STG SKIN FREE OF INFECTION/BREAKDOWN Description Patient free from skin breakdown during admission  Outcome: Progressing Goal: RH STG MAINTAIN SKIN INTEGRITY WITH ASSISTANCE Description STG Maintain Skin Integrity With [place].  Outcome: Progressing Flowsheets (Taken 09/21/2018 1237) STG: Maintain skin integrity with assistance: 4-Minimal assistance   Problem: RH SAFETY Goal: RH STG ADHERE TO SAFETY PRECAUTIONS W/ASSISTANCE/DEVICE Description STG Adhere to Safety Precautions With Min Assistance/Device.  Outcome: Progressing Flowsheets (Taken 09/21/2018 1237) STG:Pt will adhere to safety precautions with assistance/device: 4-Minimal assistance   Problem: RH PAIN MANAGEMENT Goal: RH STG PAIN MANAGED AT OR BELOW PT'S PAIN GOAL Description Pain free or pain less than 3 during admission  Outcome: Progressing   Problem: RH KNOWLEDGE DEFICIT GENERAL Goal: RH STG INCREASE KNOWLEDGE OF SELF CARE AFTER HOSPITALIZATION Description Pt will be able to state/recall  wo things regarding self care for the immunocompromised individual  Outcome: Progressing

## 2018-09-22 ENCOUNTER — Inpatient Hospital Stay (HOSPITAL_COMMUNITY): Payer: 59 | Admitting: Physical Therapy

## 2018-09-22 DIAGNOSIS — R0989 Other specified symptoms and signs involving the circulatory and respiratory systems: Secondary | ICD-10-CM

## 2018-09-22 LAB — GLUCOSE, CAPILLARY
Glucose-Capillary: 169 mg/dL — ABNORMAL HIGH (ref 70–99)
Glucose-Capillary: 112 mg/dL — ABNORMAL HIGH (ref 70–99)
Glucose-Capillary: 209 mg/dL — ABNORMAL HIGH (ref 70–99)
Glucose-Capillary: 187 mg/dL — ABNORMAL HIGH (ref 70–99)

## 2018-09-22 NOTE — Progress Notes (Signed)
[place] PHYSICAL MEDICINE & REHABILITATION PROGRESS NOTE  Subjective/Complaints: Patient seen laying in bed this morning.  He states he slept well overnight.  He denies complaints.  He is aware of his new discharge date.  ROS: Denies CP, SOB, N/V/D  Objective: Vital Signs: Blood pressure 129/81, pulse 86, temperature 98.1 F (36.7 C), temperature source Oral, resp. rate 14, height 6' 3.51" (1.918 m), weight 130.7 kg, SpO2 96 %. No results found. No results for input(s): WBC, HGB, HCT, PLT in the last 72 hours. No results for input(s): NA, K, CL, CO2, GLUCOSE, BUN, CREATININE, CALCIUM in the last 72 hours.  Physical Exam: BP 129/81 (BP Location: Left Arm)   Pulse 86   Temp 98.1 F (36.7 C) (Oral)   Resp 14   Ht 6' 3.51" (1.918 m)   Wt 130.7 kg   SpO2 96%   BMI 35.53 kg/m  Constitutional: No distress . Vital signs reviewed. HENT: Normocephalic.  Atraumatic. Eyes: EOMI.  No discharge. Cardiovascular: No JVD. Respiratory: Normal effort. GI: Non-distended. Musc: No edema or tenderness in extremities, stable Neurological: Alert and oriented Good awareness of deficits.  Motor: 4/5 throughout (pain inhibition), stable Skin: Skin iswarmand dry.  Psychiatric: He has anormal mood and affect. Hisbehavior is normal.Judgmentand thought contentnormal.   Assessment/Plan: 1. Functional deficits secondary to multiple myeloma which require 3+ hours per day of interdisciplinary therapy in a comprehensive inpatient rehab setting.  Physiatrist is providing close team supervision and 24 hour management of active medical problems listed below.  Physiatrist and rehab team continue to assess barriers to discharge/monitor patient progress toward functional and medical goals  Care Tool:  Bathing  Bathing activity did not occur: Safety/medical concerns Body parts bathed by patient: Left arm, Right arm, Chest, Abdomen, Front perineal area, Left upper leg, Right lower leg, Buttocks,  Right upper leg, Left lower leg, Face         Bathing assist Assist Level: Contact Guard/Touching assist     Upper Body Dressing/Undressing Upper body dressing Upper body dressing/undressing activity did not occur (including orthotics): Safety/medical concerns What is the patient wearing?: Pull over shirt    Upper body assist Assist Level: Set up assist    Lower Body Dressing/Undressing Lower body dressing    Lower body dressing activity did not occur: Safety/medical concerns What is the patient wearing?: Underwear/pull up, Pants     Lower body assist Assist for lower body dressing: Contact Guard/Touching assist     Toileting Toileting    Toileting assist Assist for toileting: Minimal Assistance - Patient > 75% Assistive Device Comment: of urinal per RN report    Transfers Chair/bed transfer  Transfers assist  Chair/bed transfer activity did not occur: Safety/medical concerns  Chair/bed transfer assist level: Supervision/Verbal cueing     Locomotion Ambulation   Ambulation assist   Ambulation activity did not occur: Safety/medical concerns  Assist level: Supervision/Verbal cueing Assistive device: Walker-rolling Max distance: 250'   Walk 10 feet activity   Assist  Walk 10 feet activity did not occur: Safety/medical concerns  Assist level: Supervision/Verbal cueing Assistive device: Walker-rolling   Walk 50 feet activity   Assist Walk 50 feet with 2 turns activity did not occur: Safety/medical concerns  Assist level: Supervision/Verbal cueing Assistive device: Walker-rolling    Walk 150 feet activity   Assist Walk 150 feet activity did not occur: Safety/medical concerns  Assist level: Supervision/Verbal cueing Assistive device: Walker-rolling    Walk 10 feet on uneven surface  activity   Assist Walk  10 feet on uneven surfaces activity did not occur: Safety/medical concerns         Wheelchair     Assist Will patient use  wheelchair at discharge?: Yes Type of Wheelchair: Manual Wheelchair activity did not occur: Safety/medical concerns(low hemoglobin/fatigue and pain/back spasms)  Wheelchair assist level: Supervision/Verbal cueing Max wheelchair distance: 150    Wheelchair 50 feet with 2 turns activity    Assist    Wheelchair 50 feet with 2 turns activity did not occur: Safety/medical concerns   Assist Level: Supervision/Verbal cueing   Wheelchair 150 feet activity     Assist Wheelchair 150 feet activity did not occur: Safety/medical concerns   Assist Level: Supervision/Verbal cueing      Medical Problem List and Plan: 1.Decreased functional mobilitysecondary to newly diagnosed multiple myeloma.  Continue CIR  Weekly Velcade and dexamethasone initiated 09/05/2018 per Heme/Onc 2. Antithrombotics: -DVT/anticoagulation:Subcutaneous Lovenox -antiplatelet therapy: Aspirin 81 mg daily 3. Pain Management:Oxycodone as needed -scheduled flexeril TID, DC'd on 5/25  Robaxin-750 3 times daily scheduled on 5/25 -improved -kpad prn for spasms 4. Mood:Provide emotional support -antipsychotic agents: N/A 5. Neuropsych: This patientiscapable of making decisions on hisown behalf. 6. Skin/Wound Care:Routine skin checks 7. Fluids/Electrolytes/Nutrition:Routine in and outs   Remeron started 5/18- improved appetite 8.Acute on chronic anemia. Follow-up hematology oncology services  Hemoglobin 8.2 on 5/27 after transfusion on 5/17  Hemoccult negative  Recs per Heme/Onc 9. Diabetes mellitus. Hemoglobin A1c 9.4. Provide education  Metformin 500 twice daily, increased to 1000 twice daily on 5/21  Labile on 5/30 10. Morbid obesity. BMI 35.53. Dietary follow-up 11. Constipation. Laxative assistance 12.  Hyponatremia  Sodium 131 on 5/27, labs ordered for Monday  Continue to monitor 13.  Fevers: Resolved  Completed 5-day course of IV  Levaquin  14.  Sleep disturbance  Likely exacerbated by steroids  No benefit with Ambien  Remeron started on 5/18  Improved 15.  Elevated blood pressure  Continue to monitor  Labile on 5/30 17.  AKI  Creatinine 1.32 on 5/27, labs ordered for Monday  Encourage fluids  LOS: 15 days A FACE TO FACE EVALUATION WAS PERFORMED  Sael Furches [name] 09/22/2018, 1:11 PM

## 2018-09-22 NOTE — Progress Notes (Signed)
Physical Therapy Session Note  Patient Details  Name: Edward Todd MRN: [id number] Date of Birth: 11/06/1975  Today's Date: 09/22/2018 PT Individual Time: 1505-1615 PT Individual Time Calculation (min): 70 min   Short Term Goals: Week 1:  PT Short Term Goal 1 (Week 1): Pt will perform bed mobility with min assist PT Short Term Goal 1 - Progress (Week 1): Met PT Short Term Goal 2 (Week 1): Pt will perform sit<>stand with mod assist PT Short Term Goal 2 - Progress (Week 1): Met PT Short Term Goal 3 (Week 1): Pt will initiate transfer training from bed<>w/c PT Short Term Goal 3 - Progress (Week 1): Met PT Short Term Goal 4 (Week 1): Pt will tolerate >30 min of OOB activity to participate in therapy  PT Short Term Goal 4 - Progress (Week 1): Met Week 2:  PT Short Term Goal 1 (Week 2): Pt will initiate step/stair training PT Short Term Goal 2 (Week 2): Pt will transfer supine>sit and stand pivot transfers within 5 minutes, 75% of the time PT Short Term Goal 3 (Week 2): Pt will participate in 60 min of OOB activity w/o increase in fatigue   Skilled Therapeutic Interventions/Progress Updates:   Pt received sitting on toilet with RN and agreeable to PT. Gait in room. Washing face at sink in WC with set up assist from PT. Gait in hall with RW 2 x 220ft. Supervision assist from PT with min cues for posture, AD management in turns, increased cadence, and improved step length.Stepping over 1inch cane to force improved step length. Step up on 4 inch step. coordionation re--training to tap 3 targets x 6 Biland 2 targets x 8 bil. BUE support on parallel bars with min cues for posture, step length, and improved weight shifting. All sit<>stand transfers completed with supervision assist from PT and increased to time to achieve full standing or return to seat. Pt returned to room and performed ambulatory transfer to bed with supervision assist. Sit>supine completed with supervision assist, and left supine  in bed with call bell in reach and all needs met.              Therapy Documentation Precautions:  Precautions Precautions: Fall, Back Precaution Comments:   Restrictions Weight Bearing Restrictions: No Other Position/Activity Restrictions: T3, T6 - T 10 path Fx Pain: Pain Assessment Pain Scale: 0-10 Pain Score: 2  Pain Type: Acute pain Pain Location: Back Pain Orientation: Mid Pain Descriptors / Indicators: Aching Pain Frequency: Intermittent Pain Onset: On-going Patients Stated Pain Goal: 0 Pain Intervention(s): Medication (See eMAR) Multiple Pain Sites: No   Therapy/Group: Individual Therapy  Austin E Tucker 09/22/2018, 5:51 PM  

## 2018-09-23 LAB — GLUCOSE, CAPILLARY
Glucose-Capillary: 116 mg/dL — ABNORMAL HIGH (ref 70–99)
Glucose-Capillary: 159 mg/dL — ABNORMAL HIGH (ref 70–99)
Glucose-Capillary: 153 mg/dL — ABNORMAL HIGH (ref 70–99)
Glucose-Capillary: 193 mg/dL — ABNORMAL HIGH (ref 70–99)

## 2018-09-23 NOTE — Discharge Summary (Addendum)
Physician Discharge Summary  Patient ID: Edward Todd MRN: [id number] DOB/AGE: December 28, 1975 43 y.o.  Admit date: 09/07/2018 Discharge date: 09/25/2018  Discharge Diagnoses:  Active Problems:   Multiple myeloma (HCC)   Hyponatremia   Diabetes mellitus type 2 in obese (HCC)   Acute on chronic anemia   Malaise   FUO (fever of unknown origin)   Sleep disturbance   Diabetes mellitus type 2 in nonobese (HCC)   Elevated blood pressure reading   Muscle spasm   AKI (acute kidney injury) ([place])   Labile blood glucose   Labile blood pressure   Discharged Condition: Stable  Significant Diagnostic Studies: Dg Chest 2 View  Result Date: 09/08/2018 CLINICAL DATA:  Malaise EXAM: CHEST - 2 VIEW COMPARISON:  Chest radiograph 12/09/2015 FINDINGS: Shallow lung inflation with mild central pulmonary vascular congestion. Bibasilar linear atelectasis. No pleural effusion or pneumothorax. IMPRESSION: Shallow lung inflation with bibasilar atelectasis. Electronically Signed   By: Ulyses Jarred M.D.   On: 09/08/2018 06:16   Mr Thoracic Spine Wo Contrast  Result Date: 09/01/2018 CLINICAL DATA:  43 year old male with back pain and anemia, suspicion of multiple myeloma. EXAM: MRI THORACIC SPINE WITHOUT CONTRAST TECHNIQUE: Multiplanar, multisequence MR imaging of the thoracic spine was performed. No intravenous contrast was administered. COMPARISON:  Skeletal survey 08/31/2018.  Lumbar MRI 08/30/2018. FINDINGS: Limited cervical spine imaging: Heterogeneous bone marrow signal particularly in the mid and lower cervical vertebrae. Thoracic spine segmentation: Normal on the comparison radiographs. And this numbering system appears concordant with that on the recent lumbar MRI. Alignment: Mildly exaggerated thoracic kyphosis. No spondylolisthesis. Vertebrae: Diffusely abnormal bone marrow signal throughout the thoracic spine and visible ribs, most apparent on series 9. Pathologic compression fractures of T3 (mild), and  T6 through T10 (mild-to-moderate). However, no confluent marrow edema at these levels. No retropulsion of bone. Cord: Spinal cord signal is within normal limits at all visualized levels. Mostly visible conus medullaris at T12-L1 appears normal. There is mild thoracic epidural lipomatosis which is maximal at the T5 and T6 levels. Paraspinal and other soft tissues: Small or trace layering pleural effusions. Otherwise negative visible thoracic viscera. Grossly negative visible upper abdominal viscera. No paraspinal soft tissue edema. Disc levels: No significant superimposed degenerative changes. IMPRESSION: 1. Diffusely abnormal bone marrow signal highly suspicious for diffuse skeletal multiple myeloma. 2. Pathologic mild-to-moderate thoracic compression fractures T3 and T6 through T10, but no associated marrow edema or complicating features. 3. Normal thoracic spinal cord.  No spinal stenosis. 4. Trace layering pleural effusions. Electronically Signed   By: Genevie Ann M.D.   On: 09/01/2018 19:59   Mr Lumbar Spine W Wo Contrast (assess For Abscess, Cord Compression)  Result Date: 08/30/2018 CLINICAL DATA:  Heavy object fell patient on March 5th. Patient states now unable to move legs. Severe back pain. EXAM: MRI LUMBAR SPINE WITHOUT AND WITH CONTRAST TECHNIQUE: Multiplanar and multiecho pulse sequences of the lumbar spine were obtained without and with intravenous contrast. CONTRAST:  Gadavist 10 mL. COMPARISON:  No plain films or other cross-sectional imaging are available. FINDINGS: Segmentation:  Standard. Alignment: Straightening of the normal lumbar lordosis. No subluxation. Vertebrae: Chronic deformities of L2 and L4 with endplate softening, but no acute fracture or bone marrow edema. Diffuse low signal intensity bone marrow T1 and T2 weighted images, nonspecific, potentially related to anemia or body habitus, but in the setting of endplate softening is somewhat more concerning. No retropulsion. 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 Postcontrast, diffuse punctate abnormal  enhancement of the spinous processes, greater than vertebral bodies, uncertain significance. Conus medullaris and cauda equina: Conus extends to the L1 level. Conus and cauda equina appear normal. Paraspinal and other soft tissues: Renal cystic disease, non worrisome. Disc levels: T12-L1: Normal. L1-L2:  Normal disc space.  Mild facet arthropathy.  No impingement. L2-L3:  Normal disc space.  Mild facet arthropathy.  No impingement. L3-L4: Mild congenital stenosis, posterior element hypertrophy. Normal disc space. No impingement. 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 Short pedicles. Annular bulge. Annular rent extends to the LEFT extraforaminal compartment, and is mildly vascularized indicating chronicity. BILATERAL subarticular zone narrowing affects both L5 nerve roots. BILATERAL foraminal narrowing is not clearly compressive. L5-S1: Mild congenital stenosis. Mild facet arthropathy. No impingement. 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 Subarticular zone narrowing could affect both L5 nerve roots. 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 Chronic appearing deformities of L2 and L4, but no acute or subacute bone marrow edema to suggest a recent injury. Diffusely abnormal bone marrow signal, with subcentimeter foci of postcontrast enhancement. This could be related to severe anemia, or body habitus, but a marrow replacement process cannot completely be excluded. Hematologic consultation may be warranted. Electronically Signed   By: Staci Righter M.D.   On: 08/30/2018 15:22   Dg Bone Survey Met  Result Date: 08/31/2018 CLINICAL DATA:  Low back pain, history of multiple myeloma EXAM: METASTATIC BONE SURVEY  COMPARISON:  None. FINDINGS: Small lucencies within the scapula bilaterally as can be seen with multiple myeloma. No other aggressive lytic or sclerotic osseous lesion. No periosteal reaction or bone destruction. No acute osseous abnormality. Chronic L1, L2 and L4 vertebral body compression fractures. Age-indeterminate T10, T9, T8, T6 vertebral body compression fractures. IMPRESSION: Small lucencies within the scapula bilaterally as can be seen with multiple myeloma. Age-indeterminate T6, T8, T9 and T10 vertebral body compression fractures. Electronically Signed   By: Kathreen Devoid   On: 08/31/2018 11:12    Labs:  Basic Metabolic Panel: Recent Labs  Lab 09/19/18 0418 09/24/18 1443  NA 131* 132*  K 4.6 4.5  CL 99 99  CO2 21* 23  GLUCOSE 99 141*  BUN 29* 29*  CREATININE 1.32* 1.24  CALCIUM 9.1 9.4    CBC: Recent Labs  Lab 09/19/18 0418 09/24/18 1443  WBC 5.0 4.5  NEUTROABS 2.9 2.6  HGB 8.2* 8.9*  HCT 25.1* 27.0*  MCV 91.6 91.5  PLT 371 301    CBG: Recent Labs  Lab 09/23/18 2128 09/24/18 0615 09/24/18 1142 09/24/18 1640 09/24/18 2134  GLUCAP 193* 131* 154* 163* 171*   Family history.  Maternal grandmother diabetes.  Paternal grandfather diabetes.  Denies any hypertension or cancer  Brief HPI:    Tashan Kreitzer is a 43 year old right-handed male with history of type 2 diabetes mellitus however he had stopped taking his metformin sometime ago.  By report had an accident at work March 2020 when an oxygen tank fell hit him in the back at the lower lumbar upper sacral area.  He went to [facility] physician ordered muscle relaxer pain medication but did not receive any imaging or further work-up.  Since March she has been having increased difficulty walking decreased functional mobility no bowel or bladder disturbances.  He does report being treated for pneumonia proxy 1 month ago.  Per chart  review lives with spouse.  Presented 08/30/2018 with increasing back pain.  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  Subarticular zone narrowing could affect both L5 nerve roots.  Chronic appearing deformities of L2 and L4 but no acute or subacute bone marrow edema to suggest a recent injury.  Diffusely abnormal bone marrow signal with subcentimeter foci of postcontrast enhancement.  This could possibly related to severe anemia body habitus but a bone marrow replacement process could not be excluded.  Hematology consulted hemoglobin 7.6 RBC 2.5  as well as hyperproteinemia.  A bone survey showed lesions in the scapula bilaterally suggesting possible multiple myeloma.  IR consulted for possible vertebroplasty kyphoplasty however this was not recommended per IR team.  A bone marrow biopsy performed consistent with multiple myeloma.  He was started on weekly Velcade and dexamethasone 09/05/2018.  Subcutaneous Lovenox for DVT prophylaxis.  Patient was admitted for a comprehensive rehab program.  Hospital Course: Espn Zeman was admitted to rehab 09/07/2018 for inpatient therapies to consist of PT, ST and OT at least three hours five days a week. Past admission physiatrist, therapy team and rehab RN have worked together to provide customized collaborative inpatient rehab.  Pertaining to patient newly diagnosed multiple myeloma followed by hematology oncology services weekly Velcade and dexamethasone initiated.  Subcutaneous Lovenox for DVT prophylaxis as well as low-dose aspirin.  No bleeding episodes.  Pain management use of oxycodone and scheduled Robaxin.  Acute on chronic anemia follow-up hematology oncology hemoglobin 8.2 after recent transfusion.  Hemoccult negative.  Blood sugars monitored closely hemoglobin A1c 9.4 metformin as advised.  Morbid obesity BMI 35.53 dietary follow-up.  Bouts of constipation resolved with laxative assistance.  Low-grade fever patient did complete a  course of empiric Levaquin.   Rehab course: During patient's stay in rehab weekly team conferences were held to monitor patient's progress, set goals and discuss barriers to discharge. At admission, patient required moderate assist for rolling, max assist sit to side-lying, moderate assist sit to stand.  Max assist lower body bathing minimal assist upper body dressing total assist lower body dressing total assist toilet transfers.  Physical exam.  Blood pressure 150/82 pulse 95 temperature 97.9 respiration 19 oxygen saturation 99% room air Constitutional.  Oriented x3 HEENT Head.  Normocephalic and atraumatic Eyes.  Pupils round and reactive to light no discharge without nystagmus Neck.  Supple nontender no thyromegaly normal range of motion Cardiovascular.  Normal rate exam reveals no friction rub or murmur heard Respiratory.  Effort normal no respiratory distress no wheezes no rails GI.  Soft no distention nontender without rebound Musculoskeletal normal range of motion no edema Neurological.  Alert and oriented person place displays normal reflexes upper extremities 5 out of 5 left lower extremity 3- out of 5 hip flexors 3 out of 5 knee extension and 4 out of 5 ankle dorsi plantarflexion  He  has had improvement in activity tolerance, balance, postural control as well as ability to compensate for deficits. He has had improvement in functional use RUE/LUE  and RLE/LLE as well as improvement in awareness.  Patient washes his face at sink wheelchair set up assistance from physical therapy.  Ambulates 220 feet x 2 supervision.  Stepping over 1 inch came to force improved step length.  All sit to stand transfers completed with supervision.  Patient performed ambulatory transfer to the bed with supervision assist.  Sit to supine completed  with supervision assist.  OT discussed shower goal but patient educated on technique the shower for safety.  Full family teaching completed plan discharge to  home       Disposition: Discharge disposition: 01-Home or Self Care     Discharge to home   Diet: Diabetic diet  Special Instructions: No smoking driving or alcohol  Medications at discharge. 1.  Tylenol as needed 2.  Zovirax 400 mg p.o. twice daily 3.  Aspirin 81 mg p.o. daily 4.  Os-Cal 1 tablet p.o. twice daily 5.  Revlimid 25 mg p.o. daily 6.  Glucophage 1000 mg p.o. twice daily 7.  Robaxin 750 mg p.o. 3 times daily 8.  Remeron 15 mg p.o. nightly 9.  Zofran 4 mg every 6 hours as needed nausea 10.  Oxycodone 10 mg every 6 hours as needed pain 11.  Protonix 40 mg p.o. daily 12.  Senokot S1 tablet p.o. twice daily Discharge Instructions    Ambulatory referral to Physical Medicine Rehab   Complete by:  As directed    Moderate complexity follow-up 1 to 2 weeks multiple myeloma      Follow-up Information    Jamse Arn, MD Follow up.   Specialty:  Physical Medicine and Rehabilitation Why:  Office to call for appointment Contact information: [address] [place] [address] [phone]        Truitt Merle, MD Follow up.   Specialties:  Hematology, Oncology Why:  Call for appointment Contact information: [place] [address] [phone]           Signed: Cathlyn Parsons 09/25/2018, 5:43 AM Patient seen and examined by me on day of discharge. Delice Lesch, MD, ABPMR

## 2018-09-23 NOTE — Progress Notes (Signed)
[facility] PHYSICAL MEDICINE & REHABILITATION PROGRESS NOTE  Subjective/Complaints: Patient seen laying in bed this morning.  He states he slept well overnight.  He denies complaints.  Discussed diet with nursing.  ROS: Denies CP, SOB, N/V/D  Objective: Vital Signs: Blood pressure 119/86, pulse 85, temperature 98.5 F (36.9 C), temperature source Oral, resp. rate 16, height 6' 3.51" (1.918 m), weight 130.7 kg, SpO2 99 %. No results found. No results for input(s): WBC, HGB, HCT, PLT in the last 72 hours. No results for input(s): NA, K, CL, CO2, GLUCOSE, BUN, CREATININE, CALCIUM in the last 72 hours.  Physical Exam: BP 119/86 (BP Location: Left Arm)   Pulse 85   Temp 98.5 F (36.9 C) (Oral)   Resp 16   Ht 6' 3.51" (1.918 m)   Wt 130.7 kg   SpO2 99%   BMI 35.53 kg/m  Constitutional: No distress . Vital signs reviewed. HENT: Normocephalic.  Atraumatic. Eyes: EOMI.  No discharge. Cardiovascular: No JVD. Respiratory: Normal effort. GI: Non-distended. Musc: No edema or tenderness in extremities, stable Neurological: Alert and oriented Good awareness of deficits.  Motor: 4/5 throughout (pain inhibition), unchanged Skin: Skin iswarmand dry.  Psychiatric: He has anormal mood and affect. Hisbehavior is normal.Judgmentand thought contentnormal.   Assessment/Plan: 1. Functional deficits secondary to multiple myeloma which require 3+ hours per day of interdisciplinary therapy in a comprehensive inpatient rehab setting.  Physiatrist is providing close team supervision and 24 hour management of active medical problems listed below.  Physiatrist and rehab team continue to assess barriers to discharge/monitor patient progress toward functional and medical goals  Care Tool:  Bathing  Bathing activity did not occur: Safety/medical concerns Body parts bathed by patient: Left arm, Right arm, Chest, Abdomen, Front perineal area, Left upper leg, Right lower leg, Buttocks, Right  upper leg, Left lower leg, Face         Bathing assist Assist Level: Contact Guard/Touching assist     Upper Body Dressing/Undressing Upper body dressing Upper body dressing/undressing activity did not occur (including orthotics): Safety/medical concerns What is the patient wearing?: Pull over shirt    Upper body assist Assist Level: Set up assist    Lower Body Dressing/Undressing Lower body dressing    Lower body dressing activity did not occur: Safety/medical concerns What is the patient wearing?: Underwear/pull up, Pants     Lower body assist Assist for lower body dressing: Contact Guard/Touching assist     Toileting Toileting    Toileting assist Assist for toileting: Minimal Assistance - Patient > 75% Assistive Device Comment: of urinal per RN report    Transfers Chair/bed transfer  Transfers assist  Chair/bed transfer activity did not occur: Safety/medical concerns  Chair/bed transfer assist level: Supervision/Verbal cueing     Locomotion Ambulation   Ambulation assist   Ambulation activity did not occur: Safety/medical concerns  Assist level: Supervision/Verbal cueing Assistive device: Walker-rolling Max distance: 250'   Walk 10 feet activity   Assist  Walk 10 feet activity did not occur: Safety/medical concerns  Assist level: Supervision/Verbal cueing Assistive device: Walker-rolling   Walk 50 feet activity   Assist Walk 50 feet with 2 turns activity did not occur: Safety/medical concerns  Assist level: Supervision/Verbal cueing Assistive device: Walker-rolling    Walk 150 feet activity   Assist Walk 150 feet activity did not occur: Safety/medical concerns  Assist level: Supervision/Verbal cueing Assistive device: Walker-rolling    Walk 10 feet on uneven surface  activity   Assist Walk 10 feet on uneven  surfaces activity did not occur: Safety/medical concerns         Wheelchair     Assist Will patient use wheelchair  at discharge?: Yes Type of Wheelchair: Manual Wheelchair activity did not occur: Safety/medical concerns(low hemoglobin/fatigue and pain/back spasms)  Wheelchair assist level: Supervision/Verbal cueing Max wheelchair distance: 150    Wheelchair 50 feet with 2 turns activity    Assist    Wheelchair 50 feet with 2 turns activity did not occur: Safety/medical concerns   Assist Level: Supervision/Verbal cueing   Wheelchair 150 feet activity     Assist Wheelchair 150 feet activity did not occur: Safety/medical concerns   Assist Level: Supervision/Verbal cueing      Medical Problem List and Plan: 1.Decreased functional mobilitysecondary to newly diagnosed multiple myeloma.  Continue CIR  Weekly Velcade and dexamethasone initiated 09/05/2018 per Heme/Onc 2. Antithrombotics: -DVT/anticoagulation:Subcutaneous Lovenox -antiplatelet therapy: Aspirin 81 mg daily 3. Pain Management:Oxycodone as needed -scheduled flexeril TID, DC'd on 5/25  Robaxin-750 3 times daily scheduled on 5/25 -improved -kpad prn for spasms 4. Mood:Provide emotional support -antipsychotic agents: N/A 5. Neuropsych: This patientiscapable of making decisions on hisown behalf. 6. Skin/Wound Care:Routine skin checks 7. Fluids/Electrolytes/Nutrition:Routine in and outs   Remeron started 5/18- improved appetite 8.Acute on chronic anemia. Follow-up hematology oncology services  Hemoglobin 8.2 on 5/27 after transfusion on 5/17  Hemoccult negative  Recs per Heme/Onc 9. Diabetes mellitus. Hemoglobin A1c 9.4. Provide education  Metformin 500 twice daily, increased to 1000 twice daily on 5/21  Labile on 5/31 10. Morbid obesity. BMI 35.53. Dietary follow-up 11. Constipation. Laxative assistance 12.  Hyponatremia  Sodium 131 on 5/27, labs ordered for tomorrow  Continue to monitor 13.  Fevers: Resolved  Completed 5-day course of IV Levaquin   14.  Sleep disturbance  Likely exacerbated by steroids  No benefit with Ambien  Remeron started on 5/18  Improved 15.  Elevated blood pressure  Continue to monitor  Labile on 5/31 17.  AKI  Creatinine 1.32 on 5/27, labs ordered for tomorrow  Encourage fluids  LOS: 16 days A FACE TO FACE EVALUATION WAS PERFORMED   [name] 09/23/2018, 1:11 PM

## 2018-09-24 ENCOUNTER — Telehealth: Payer: Self-pay | Admitting: Hematology

## 2018-09-24 ENCOUNTER — Inpatient Hospital Stay (HOSPITAL_COMMUNITY): Payer: 59 | Admitting: Occupational Therapy

## 2018-09-24 ENCOUNTER — Inpatient Hospital Stay (HOSPITAL_COMMUNITY): Payer: 59 | Admitting: Physical Therapy

## 2018-09-24 LAB — CBC WITH DIFFERENTIAL/PLATELET
Abs Immature Granulocytes: 0.01 10*3/uL (ref 0.00–0.07)
Basophils Absolute: 0 10*3/uL (ref 0.0–0.1)
Basophils Relative: 0 %
Eosinophils Absolute: 0 10*3/uL (ref 0.0–0.5)
Eosinophils Relative: 1 %
HCT: 27 % — ABNORMAL LOW (ref 39.0–52.0)
Hemoglobin: 8.9 g/dL — ABNORMAL LOW (ref 13.0–17.0)
Immature Granulocytes: 0 %
Lymphocytes Relative: 33 %
Lymphs Abs: 1.5 10*3/uL (ref 0.7–4.0)
MCH: 30.2 pg (ref 26.0–34.0)
MCHC: 33 g/dL (ref 30.0–36.0)
MCV: 91.5 fL (ref 80.0–100.0)
Monocytes Absolute: 0.3 10*3/uL (ref 0.1–1.0)
Monocytes Relative: 8 %
Neutro Abs: 2.6 10*3/uL (ref 1.7–7.7)
Neutrophils Relative %: 58 %
Platelets: 301 10*3/uL (ref 150–400)
RBC: 2.95 MIL/uL — ABNORMAL LOW (ref 4.22–5.81)
RDW: 14.5 % (ref 11.5–15.5)
WBC: 4.5 10*3/uL (ref 4.0–10.5)
nRBC: 0 % (ref 0.0–0.2)

## 2018-09-24 LAB — BASIC METABOLIC PANEL
Anion gap: 10 (ref 5–15)
BUN: 29 mg/dL — ABNORMAL HIGH (ref 6–20)
CO2: 23 mmol/L (ref 22–32)
Calcium: 9.4 mg/dL (ref 8.9–10.3)
Chloride: 99 mmol/L (ref 98–111)
Creatinine, Ser: 1.24 mg/dL (ref 0.61–1.24)
GFR calc Af Amer: >60 mL/min (ref >60)
GFR calc non Af Amer: >60 mL/min (ref >60)
Glucose, Bld: 141 mg/dL — ABNORMAL HIGH (ref 70–99)
Potassium: 4.5 mmol/L (ref 3.5–5.1)
Sodium: 132 mmol/L — ABNORMAL LOW (ref 135–145)

## 2018-09-24 LAB — GLUCOSE, CAPILLARY
Glucose-Capillary: 131 mg/dL — ABNORMAL HIGH (ref 70–99)
Glucose-Capillary: 154 mg/dL — ABNORMAL HIGH (ref 70–99)
Glucose-Capillary: 163 mg/dL — ABNORMAL HIGH (ref 70–99)
Glucose-Capillary: 171 mg/dL — ABNORMAL HIGH (ref 70–99)

## 2018-09-24 MED ORDER — MIRTAZAPINE 15 MG PO TABS: 15.0000 mg | ORAL_TABLET | Freq: Every day | ORAL | 1 refills | Status: DC

## 2018-09-24 MED ORDER — ASPIRIN 81 MG PO CHEW: 81.0000 mg | CHEWABLE_TABLET | Freq: Every day | ORAL | Status: AC

## 2018-09-24 MED ORDER — POLYETHYLENE GLYCOL 3350 17 G PO PACK: 17.0000 g | PACK | Freq: Every day | ORAL | 0 refills | Status: AC

## 2018-09-24 MED ORDER — LENALIDOMIDE 25 MG PO CAPS: 25.0000 mg | ORAL_CAPSULE | Freq: Every day | ORAL | 0 refills | Status: DC

## 2018-09-24 MED ORDER — PANTOPRAZOLE SODIUM 40 MG PO TBEC: 40.0000 mg | DELAYED_RELEASE_TABLET | Freq: Every day | ORAL | 0 refills | Status: DC

## 2018-09-24 MED ORDER — ONDANSETRON 4 MG PO TBDP: 4.0000 mg | ORAL_TABLET | Freq: Four times a day (QID) | ORAL | 0 refills | Status: AC | PRN

## 2018-09-24 MED ORDER — METFORMIN HCL 1000 MG PO TABS: 1000.0000 mg | ORAL_TABLET | Freq: Two times a day (BID) | ORAL | 0 refills | Status: DC

## 2018-09-24 MED ORDER — ACETAMINOPHEN 325 MG PO TABS: 650.0000 mg | ORAL_TABLET | Freq: Four times a day (QID) | ORAL | Status: DC | PRN

## 2018-09-24 MED ORDER — OXYCODONE HCL 10 MG PO TABS: 10.0000 mg | ORAL_TABLET | Freq: Four times a day (QID) | ORAL | 0 refills | Status: DC | PRN

## 2018-09-24 MED ORDER — CALCIUM CARBONATE-VITAMIN D 500-200 MG-UNIT PO TABS: 1.0000 | ORAL_TABLET | Freq: Two times a day (BID) | ORAL | 1 refills | Status: AC

## 2018-09-24 MED ORDER — METHOCARBAMOL 750 MG PO TABS: 750.0000 mg | ORAL_TABLET | Freq: Three times a day (TID) | ORAL | 0 refills | Status: DC

## 2018-09-24 MED ORDER — ACYCLOVIR 400 MG PO TABS: 400.0000 mg | ORAL_TABLET | Freq: Two times a day (BID) | ORAL | 0 refills | Status: DC

## 2018-09-24 NOTE — Progress Notes (Signed)
[facility] PHYSICAL MEDICINE & REHABILITATION PROGRESS NOTE  Subjective/Complaints: Patient seen laying in bed this morning.  He states he slept well overnight.  He states he is looking forward to discharge tomorrow.  ROS: Denies CP, SOB, N/V/D  Objective: Vital Signs: Blood pressure 134/83, pulse 91, temperature 98.3 F (36.8 C), temperature source Oral, resp. rate 16, height 6' 3.51" (1.918 m), weight 130.7 kg, SpO2 97 %. No results found. No results for input(s): WBC, HGB, HCT, PLT in the last 72 hours. No results for input(s): NA, K, CL, CO2, GLUCOSE, BUN, CREATININE, CALCIUM in the last 72 hours.  Physical Exam: BP 134/83 (BP Location: Left Arm)   Pulse 91   Temp 98.3 F (36.8 C) (Oral)   Resp 16   Ht 6' 3.51" (1.918 m)   Wt 130.7 kg   SpO2 97%   BMI 35.53 kg/m  Constitutional: No distress . Vital signs reviewed. HENT: Normocephalic.  Atraumatic. Eyes: EOMI.  No discharge. Cardiovascular: No JVD. Respiratory: Normal effort. GI: Non-distended. Musc: No edema or tenderness in extremities, unchanged Neurological: Alert and oriented Good awareness of deficits.  Motor: 4/5 throughout (pain inhibition), unchanged Skin: Skin iswarmand dry.  Psychiatric: He has anormal mood and affect. Hisbehavior is normal.Judgmentand thought contentnormal.   Assessment/Plan: 1. Functional deficits secondary to multiple myeloma which require 3+ hours per day of interdisciplinary therapy in a comprehensive inpatient rehab setting.  Physiatrist is providing close team supervision and 24 hour management of active medical problems listed below.  Physiatrist and rehab team continue to assess barriers to discharge/monitor patient progress toward functional and medical goals  Care Tool:  Bathing  Bathing activity did not occur: Safety/medical concerns Body parts bathed by patient: Left arm, Right arm, Chest, Abdomen, Front perineal area, Left upper leg, Right lower leg, Buttocks,  Right upper leg, Left lower leg, Face         Bathing assist Assist Level: Contact Guard/Touching assist     Upper Body Dressing/Undressing Upper body dressing Upper body dressing/undressing activity did not occur (including orthotics): Safety/medical concerns What is the patient wearing?: Pull over shirt    Upper body assist Assist Level: Set up assist    Lower Body Dressing/Undressing Lower body dressing    Lower body dressing activity did not occur: Safety/medical concerns What is the patient wearing?: Underwear/pull up, Pants     Lower body assist Assist for lower body dressing: Contact Guard/Touching assist     Toileting Toileting    Toileting assist Assist for toileting: Minimal Assistance - Patient > 75% Assistive Device Comment: of urinal per RN report    Transfers Chair/bed transfer  Transfers assist  Chair/bed transfer activity did not occur: Safety/medical concerns  Chair/bed transfer assist level: Supervision/Verbal cueing     Locomotion Ambulation   Ambulation assist   Ambulation activity did not occur: Safety/medical concerns  Assist level: Supervision/Verbal cueing Assistive device: Walker-rolling Max distance: 250'   Walk 10 feet activity   Assist  Walk 10 feet activity did not occur: Safety/medical concerns  Assist level: Supervision/Verbal cueing Assistive device: Walker-rolling   Walk 50 feet activity   Assist Walk 50 feet with 2 turns activity did not occur: Safety/medical concerns  Assist level: Supervision/Verbal cueing Assistive device: Walker-rolling    Walk 150 feet activity   Assist Walk 150 feet activity did not occur: Safety/medical concerns  Assist level: Supervision/Verbal cueing Assistive device: Walker-rolling    Walk 10 feet on uneven surface  activity   Assist Walk 10 feet on  uneven surfaces activity did not occur: Safety/medical concerns         Wheelchair     Assist Will patient use  wheelchair at discharge?: Yes Type of Wheelchair: Manual Wheelchair activity did not occur: Safety/medical concerns(low hemoglobin/fatigue and pain/back spasms)  Wheelchair assist level: Supervision/Verbal cueing Max wheelchair distance: 150    Wheelchair 50 feet with 2 turns activity    Assist    Wheelchair 50 feet with 2 turns activity did not occur: Safety/medical concerns   Assist Level: Supervision/Verbal cueing   Wheelchair 150 feet activity     Assist Wheelchair 150 feet activity did not occur: Safety/medical concerns   Assist Level: Supervision/Verbal cueing      Medical Problem List and Plan: 1.Decreased functional mobilitysecondary to newly diagnosed multiple myeloma.  Continue CIR  Weekly Velcade and dexamethasone initiated 09/05/2018 per Heme/Onc  Plan for d/c tomorrow  Will see patient for transitional care management in 1-2 weeks post-discharge 2. Antithrombotics: -DVT/anticoagulation:Subcutaneous Lovenox -antiplatelet therapy: Aspirin 81 mg daily 3. Pain Management:Oxycodone as needed -scheduled flexeril TID, DC'd on 5/25  Robaxin-750 3 times daily scheduled on 5/25 -improved -kpad prn for spasms 4. Mood:Provide emotional support -antipsychotic agents: N/A 5. Neuropsych: This patientiscapable of making decisions on hisown behalf. 6. Skin/Wound Care:Routine skin checks 7. Fluids/Electrolytes/Nutrition:Routine in and outs   Remeron started 5/18- improved appetite 8.Acute on chronic anemia. Follow-up hematology oncology services  Hemoglobin 8.2 on 5/27 after transfusion on 5/17  Hemoccult negative  Recs per Heme/Onc 9. Diabetes mellitus. Hemoglobin A1c 9.4. Provide education  Metformin 500 twice daily, increased to 1000 twice daily on 5/21  Labile on 6/1, will need ambulatory monitoring and further adjustments 10. Morbid obesity. BMI 35.53. Dietary follow-up 11. Constipation.  Laxative assistance 12.  Hyponatremia  Sodium 131 on 5/27, labs pending  Continue to monitor 13.  Fevers: Resolved  Completed 5-day course of IV Levaquin  14.  Sleep disturbance  Likely exacerbated by steroids  No benefit with Ambien  Remeron started on 5/18  Improved 15.  Elevated blood pressure  Continue to monitor  Relatively controlled on 6/1 17.  AKI  Creatinine 1.32 on 5/27, labs pending  Encourage fluids  LOS: 17 days A FACE TO FACE EVALUATION WAS PERFORMED  Ankit [name] 09/24/2018, 9:16 AM

## 2018-09-24 NOTE — Progress Notes (Signed)
Occupational Therapy Session Note  Patient Details  Name: Edward Todd MRN: [id number] Date of Birth: 1975-07-15  Today's Date: 09/24/2018 OT Individual Time: 1016-1100 OT Individual Time Calculation (min): 44 min    Short Term Goals: Week 2:  OT Short Term Goal 1 (Week 2): Patient will complete bed mobility and functional transfers with min A OT Short Term Goal 2 (Week 2): patient will tolerate OOB for 1 hour at a time and full therapy schedule OT Short Term Goal 3 (Week 2): Patient will complete UB bathing and dressing with min A, LB bathing and dressing with mod A  Skilled Therapeutic Interventions/Progress Updates:    Pt greeted seated EOB and agreeable to OT treatment session focused on self-care retraining at shower level. PICC line covered with waterproof dressing by PT in earlier session. Pt needed more than reasonable amount of time and 4 trials to come to standing today. Pt took 4 minutes in standing moving legs back and forth to "warm up." OT educated on energy conservation techniques and if he "warms up" too long, this could use a lot of the energy he needs for functional ambulation and BADL tasks. Pt took extended time to ambulate into bathroom and transfer onto tub bench. Pt with difficulty lifting UEs to wash body initially, but eventually able to bathe. OT issued LH sponge to increase acces to LB bathing. Pt used leaning method to wash buttocks with OT education on technique. Pt request to laterally scoot to transfer out of shower with CGA. Pt completed UB dressing sitting in wc, but felt too tired to stand or complete LB dressing so pt left seated in wc to rest until he could perform LB dressing with nursing. Pt agreeable to call nursing staff when he was ready for LB dressing. Call bell in reach and needs met.   Therapy Documentation Precautions:  Precautions Precautions: Fall, Back Precaution Comments:   Restrictions Weight Bearing Restrictions: No Other  Position/Activity Restrictions: T3, T6 - T 10 path Fx Pain: Pain Assessment Pain Scale: 0-10 Pain Score: 7 Pain Type: Acute pain Pain Location: Back Pain Orientation: Mid Pain Descriptors / Indicators: Discomfort Pain Intervention(s): Repositioned  Therapy/Group: Individual Therapy  Valma Cava 09/24/2018, 10:42 AM

## 2018-09-24 NOTE — Progress Notes (Signed)
Physical Therapy Session Note  Patient Details  Name: Edward Todd MRN: [id number] Date of Birth: [birth year]  Today's Date: 09/24/2018 PT Individual Time: 0805-0902 AND 1255-1425 PT Individual Time Calculation (min): 57 min AND 90 min  Short Term Goals: Week 2:  PT Short Term Goal 1 (Week 2): Pt will initiate step/stair training PT Short Term Goal 2 (Week 2): Pt will transfer supine>sit and stand pivot transfers within 5 minutes, 75% of the time PT Short Term Goal 3 (Week 2): Pt will participate in 60 min of OOB activity w/o increase in fatigue   Skilled Therapeutic Interventions/Progress Updates:   Session 1:  Pt in supine and agreeable to therapy, pain 8/10 in back and refusing to attempt movement until RN provides pain medication. RN present to provide medication. Pt transferred supine to sitting at EOB w/ significantly increased time, but w/o any physical assist from therapist. Verbal reminders for log-roll technique and verbal reminders to engage core musculature w/ all movement to protect back. Required 45 min to transfer supine to sit in total. Pt requesting to shower during next session w/ OT, requesting assistance w/ setting it up. Ongoing education of energy conservation strategies including taking his time getting OOB in the morning, then taking a rest, and then showering as both of those tasks are very fatiguing to him. Supervision to remove shirt in seated and therapist wrapped PICC line in preparation for shower, total assist. Ended session sitting EOB, all needs in reach.   Session 2:  Pt seated EOB upon arrival and agreeable to therapy, pain 9/10 this afternoon - RN present to provide medication. Session focused on overall endurance and functional transfers. Stand pivot transfer to w/c. Pt self-propelled w/c to therapy gym w/ supervision using BUEs to work on endurance training. Practiced car transfer at pt's sedan car height. Primarily CGA for transfer w/ exception of min  assist needed to bring LEs into car. Verbal and visual cues for technique. Pt ambulated to ADL apartment from car, ~150' w/ supervision using RW. Practiced recliner transfer in preparation to d/c to home. Pt needed no physical assist but significant cues for technique and encouragement to attempt as pt very fearful. Pt safe w/ transfer, however the low recliner seat caused pt significant pain in low back upon squatting to sit. Returned to room total assist in w/c and performed stand pivot back to EOB. Pt required significant amount of time this session for all mobility 2/2 pain and guarded movements. Pt understands that he expends more energy by taking longer to perform tasks but continues to prefer to mobilize this way to manage his pain. Ongoing education and reminders throughout session for pain management techniques including deep breathing, mindfulness, meditation, and music. Ended session in supine, all needs in reach.   Therapy Documentation Precautions:  Precautions Precautions: Fall, Back Precaution Comments:   Restrictions Weight Bearing Restrictions: No Other Position/Activity Restrictions: T3, T6 - T 10 path Fx Vital Signs:    Therapy/Group: Individual Therapy  Yasira Engelson K Vanshika Jastrzebski 09/24/2018, 9:03 AM

## 2018-09-24 NOTE — Telephone Encounter (Signed)
Spoke with patient re 6/4 appointments.

## 2018-09-24 NOTE — Progress Notes (Signed)
Physical Therapy Discharge Summary  Patient Details  Name: Edward Todd MRN: [id number] Date of Birth: [birth year]  Today's Date: 09/25/2018 PT Individual Time: 1005-1050 PT Individual Time Calculation (min): 45 min   Pt in supine and agreeable to therapy, pain 6/10 in back. Wife present for caregiver education this session. Educated wife on providing supervision level assist for all mobility for safety, energy conservation strategies, and pain management strategies. Pt appropriately guarding pt during bed mobility, stand pivot transfers, gait, and curb negotiation for home access. Also verbally discussed car transfer technique, deferred practicing this sesison for pt to conserve energy w/ d/c this afternoon. Both verbalized understanding and in agreement w/ all education. Ended session in w/c and in care of wife, all needs met.   Patient has met 6 of 6 long term goals due to improved activity tolerance, improved balance, increased strength, decreased pain and ability to compensate for deficits.  Patient to discharge at an ambulatory level Supervision.   Patient's care partner is independent to provide the necessary physical assistance at discharge.  Reasons goals not met: n/a  Recommendation:  Patient will benefit from ongoing skilled PT services in home health setting to continue to advance safe functional mobility, address ongoing impairments in endurance, global strengthening, pain management, and pain management techniques, and minimize fall risk.  Equipment: RW, w/c  Reasons for discharge: treatment goals met and discharge from hospital  Patient/family agrees with progress made and goals achieved: Yes  PT Discharge Precautions/Restrictions Precautions Precautions: Fall;Back Restrictions Weight Bearing Restrictions: No Other Position/Activity Restrictions: T3, T6 - T 10 path Fx Vision/Perception  Perception Perception: Within Functional Limits Praxis Praxis: Intact   Cognition Overall Cognitive Status: Within Functional Limits for tasks assessed Arousal/Alertness: Awake/alert Orientation Level: Oriented X4 Memory: Appears intact Awareness: Appears intact Problem Solving: Appears intact Safety/Judgment: Appears intact Sensation Sensation Light Touch: Appears Intact Coordination Gross Motor Movements are Fluid and Coordinated: No Coordination and Movement Description: limited 2/2 guarding of painful movements  Motor  Motor Motor: Within Functional Limits Motor - Discharge Observations: generalized weakness  Mobility Bed Mobility Bed Mobility: Rolling Right;Rolling Left;Supine to Sit;Sit to Supine Rolling Right: Supervision/verbal cueing Rolling Left: Supervision/Verbal cueing Supine to Sit: Supervision/Verbal cueing Sit to Supine: Supervision/Verbal cueing Transfers Transfers: Sit to Stand;Stand to Sit;Stand Pivot Transfers Sit to Stand: Supervision/Verbal cueing Stand to Sit: Supervision/Verbal cueing Stand Pivot Transfers: Supervision/Verbal cueing Transfer (Assistive device): Rolling walker Locomotion  Gait Ambulation: Yes Gait Assistance: Supervision/Verbal cueing Gait Distance (Feet): 150 Feet Assistive device: Rolling walker Gait Assistance Details: Verbal cues for gait pattern Gait Gait: Yes Gait Pattern: Impaired Gait Pattern: Decreased step length - left;Decreased step length - right;Shuffle Gait velocity: decreased  Stairs / Additional Locomotion Stairs: Yes Stairs Assistance: Contact Guard/Touching assist Stair Management Technique: Backwards;With walker Number of Stairs: 1 Height of Stairs: 4 Curb: Nurse, mental health Mobility: Yes Wheelchair Assistance: Chartered loss adjuster: Both upper extremities Wheelchair Parts Management: Supervision/cueing Distance: 150'  Trunk/Postural Assessment  Cervical Assessment Cervical Assessment: Within  Functional Limits Thoracic Assessment Thoracic Assessment: Within Functional Limits Lumbar Assessment Lumbar Assessment: Within Functional Limits Postural Control Postural Control: Deficits on evaluation(relies heavily on UE support for all balance both in seated and standing)  Balance Balance Balance Assessed: Yes Static Sitting Balance Static Sitting - Balance Support: Bilateral upper extremity supported;Feet unsupported Static Sitting - Level of Assistance: 7: Independent Dynamic Sitting Balance Dynamic Sitting - Balance Support: Bilateral upper extremity supported;Feet supported Dynamic Sitting - Level of Assistance: 6: Modified  independent (Device/Increase time) Static Standing Balance Static Standing - Balance Support: During functional activity;Bilateral upper extremity supported Static Standing - Level of Assistance: 5: Stand by assistance Dynamic Standing Balance Dynamic Standing - Balance Support: During functional activity;Right upper extremity supported Dynamic Standing - Level of Assistance: 5: Stand by assistance Extremity Assessment  RLE Assessment RLE Assessment: Exceptions to [facility] Passive Range of Motion (PROM) Comments: [facility] General Strength Comments: globally 4/5  LLE Assessment LLE Assessment: Exceptions to [facility] Passive Range of Motion (PROM) Comments: [facility] General Strength Comments: globally 4/5     Edward Todd Edward Todd 09/25/2018, 12:52 PM

## 2018-09-25 ENCOUNTER — Encounter (HOSPITAL_COMMUNITY): Payer: 59 | Admitting: Occupational Therapy

## 2018-09-25 ENCOUNTER — Ambulatory Visit (HOSPITAL_COMMUNITY): Payer: 59 | Admitting: Physical Therapy

## 2018-09-25 LAB — GLUCOSE, CAPILLARY
Glucose-Capillary: 111 mg/dL — ABNORMAL HIGH (ref 70–99)
Glucose-Capillary: 133 mg/dL — ABNORMAL HIGH (ref 70–99)

## 2018-09-25 MED ORDER — SENNOSIDES-DOCUSATE SODIUM 8.6-50 MG PO TABS: 1.0000 | ORAL_TABLET | Freq: Two times a day (BID) | ORAL | Status: AC

## 2018-09-25 NOTE — Progress Notes (Signed)
Social Work  Discharge Note  The overall goal for the admission was met for:   Discharge location: Yes - home with wife and 44 yo son.  Wife working from home and can provide 24/7 assistance.  Length of Stay: Yes - 18 days  Discharge activity level: Yes - minimal assistance overall  Home/community participation: Yes  Services provided included: MD, RD, PT, OT, RN, TR, Pharmacy and [facility]: Private Insurance: [employer]  Follow-up services arranged: Home Health: PT, OT via [facility], DME: 18x18 lightweight w/c, cushion, rolling walker, 3n1 commode via [place] and Patient/Family has no preference for HH/DME agencies  Comments (or additional information):       Contact info:  Patient @ [phone]       Wife, Stacey Maura @ [phone]  Patient/Family verbalized understanding of follow-up arrangements: Yes  Individual responsible for coordination of the follow-up plan: pt  Confirmed correct DME delivered: Lennart Pall 09/25/2018    Tacarra Justo

## 2018-09-25 NOTE — Discharge Instructions (Signed)
Inpatient Rehab Discharge Instructions  Edward Todd Discharge date and time: No discharge date for patient encounter.   Activities/Precautions/ Functional Status: Activity: activity as tolerated Diet: diabetic diet Wound Care: none needed Functional status:  ___ No restrictions     ___ Walk up steps independently ___ 24/7 supervision/assistance   ___ Walk up steps with assistance ___ Intermittent supervision/assistance  ___ Bathe/dress independently ___ Walk with walker     _x__ Bathe/dress with assistance ___ Walk Independently    ___ Shower independently ___ Walk with assistance    ___ Shower with assistance ___ No alcohol     ___ Return to work/school ________     COMMUNITY REFERRALS UPON DISCHARGE:    Home Health:   PT     OT                       Agency:  [place] Phone: [phone]   Medical Equipment/Items Ordered:  Wheelchair, walker, commode                                                       Agency/Supplier:  [facility] @ [phone]      Special Instructions: No driving, smoking or alcohol   My questions have been answered and I understand these instructions. I will adhere to these goals and the provided educational materials after my discharge from the hospital.  Patient/Caregiver Signature _______________________________ Date __________  Clinician Signature _______________________________________ Date __________  Please bring this form and your medication list with you to all your follow-up doctor's appointments.

## 2018-09-25 NOTE — Progress Notes (Signed)
Occupational Therapy Discharge Summary  Patient Details  Name: Edward Todd MRN: [id number] Date of Birth: 1976-04-14  Today's Date: 09/25/2018 OT Individual Time: 1100-1145 OT Individual Time Calculation (min): 45 min   OT treatment session focused on pt/family education. Discussed OT goals and pt progress with pt and his wife. Educated wife on wc functions and ADL AE uses for increased independence with self-care tasks. Practiced tub bench transfer in simulated home environment and help pt's spouse find appropriate DME to have delivered to the house from [facility]. Pt left seated in wc at end of session with spouse present and needs met.   Patient has met 13 of 13 long term goals due to improved activity tolerance, improved balance, postural control, ability to compensate for deficits and functional use of  RIGHT upper, RIGHT lower, LEFT upper and LEFT lower extremity.  Patient to discharge at overall Supervision/CGA level.  Patient's care partner is independent to provide the necessary physical assistance at discharge.    Reasons goals not met: n/a  Recommendation:  Patient will benefit from ongoing skilled OT services in home health setting to continue to advance functional skills in the area of BADL.  Equipment: wheelchair, RW, 3-in-1 BSC, tub transfer bench(purchased privately)  Reasons for discharge: treatment goals met and discharge from hospital  Patient/family agrees with progress made and goals achieved: Yes  OT Discharge Precautions/Restrictions  Precautions Precautions: Fall;Back Precaution Comments:   Restrictions Weight Bearing Restrictions: No Pain Pain Assessment Pain Scale: 0-10 Pain Score: 5 Pain Type: Acute pain Pain Location: Back Pain Orientation: Lower Pain Descriptors / Indicators: Aching;Discomfort Pain Intervention(s): Repositioned ADL ADL Eating: Independent Where Assessed-Eating: Bed level Grooming: Independent Upper Body Bathing:  Supervision/safety Lower Body Bathing: Contact guard Upper Body Dressing: Setup Lower Body Dressing: Contact guard Toileting: Contact guard Toilet Transfer: Contact guard Tub/Shower Transfer: Metallurgist Method: Ambulating Tub/Shower Equipment: Civil engineer, contracting with back Social research officer, government: Not assessed(unable to tolerate) ADL Comments: patient able to wash his face with min A Perception  Perception: Within Functional Limits Praxis Praxis: Intact Cognition Overall Cognitive Status: Within Functional Limits for tasks assessed Arousal/Alertness: Awake/alert Orientation Level: Oriented X4 Memory: Appears intact Awareness: Appears intact Problem Solving: Appears intact Safety/Judgment: Appears intact Sensation Sensation Light Touch: Appears Intact Coordination Gross Motor Movements are Fluid and Coordinated: No Fine Motor Movements are Fluid and Coordinated: Yes Motor  Motor Motor: Within Functional Limits Motor - Discharge Observations: generalized weakness Mobility  Transfers Sit to Stand: Supervision/Verbal cueing Stand to Sit: Supervision/Verbal cueing  Trunk/Postural Assessment  Cervical Assessment Cervical Assessment: Within Functional Limits Thoracic Assessment Thoracic Assessment: Within Functional Limits Lumbar Assessment Lumbar Assessment: Within Functional Limits  Balance Balance Balance Assessed: Yes Static Sitting Balance Static Sitting - Balance Support: Bilateral upper extremity supported;Feet unsupported Static Sitting - Level of Assistance: 7: Independent Dynamic Sitting Balance Dynamic Sitting - Balance Support: Bilateral upper extremity supported;Feet supported Dynamic Sitting - Level of Assistance: 6: Modified independent (Device/Increase time) Static Standing Balance Static Standing - Balance Support: During functional activity;Bilateral upper extremity supported Static Standing - Level of Assistance: 5: Stand by  assistance Dynamic Standing Balance Dynamic Standing - Balance Support: During functional activity;Right upper extremity supported Dynamic Standing - Level of Assistance: 5: Stand by assistance Extremity/Trunk Assessment RUE Assessment RUE Assessment: Within Functional Limits Passive Range of Motion (PROM) Comments: WNLs Active Range of Motion (AROM) Comments: WFL in gravity eliminated plane General Strength Comments: 4/5 overall LUE Assessment LUE Assessment: Within Functional Limits Passive Range of Motion (PROM)  Comments: WNLs Active Range of Motion (AROM) Comments: WFL in a gravity eliminated plane General Strength Comments: 4/5 overall   Daneen Schick Sheryl Saintil 09/25/2018, 12:29 PM

## 2018-09-25 NOTE — Progress Notes (Signed)
Patient alert and oriented was discharge home with wife via car, No complaints at this time. Patient vitals are stable. Patient will wheeled down via wheelchair by staff.

## 2018-09-26 ENCOUNTER — Telehealth: Payer: Self-pay

## 2018-09-26 ENCOUNTER — Other Ambulatory Visit: Payer: Self-pay | Admitting: Hematology

## 2018-09-26 DIAGNOSIS — C9000 Multiple myeloma not having achieved remission: Secondary | ICD-10-CM

## 2018-09-26 NOTE — Telephone Encounter (Signed)
  TRANSITIONAL CARE CALL  Patient name: Pau Banh)  DOB: ([birth year])      1. Are you/is patient experiencing any problems since coming home? (NO)  a. Are there any questions regarding any aspect of care? (NA)   2. Are there any questions regarding medications administration/dosing? (NO)  a. Are meds being taken as prescribed? (YES)   3. Have there been any falls? (NO)   4. Has Home Health been to the house and/or have they contacted you? (YES)  a. If not, have you tried to contact them? (NA)  b. Can we help you contact them? (NA)   5. Are bowels and bladder emptying properly? (YES)  a. Are there any unexpected incontinence issues? (NO)  b. If applicable, is patient following bowel/bladder programs? (NA)   6. Any fevers, problems with breathing, or unexpected pain? (NO)    7. Are there any skin problems or new areas of breakdown? (NO)   8. Has the patient/family member arranged specialty MD follow up? (ie, cardiology/neuro) (YES)  a. Can we help arrange? (NA)   9. Does the patient need any other services or support that we can help arrange? (NO)   10. Are caregivers following through as expected in assisting the patient? (YES)   11. Has the patient quit smoking, drinking alcohol, or using drugs as recommended? (NA)   Appointment date/time: (10-08-2018 / 1140am)  Arrive time: (1120am)  With: Zella Ball then Dr. Posey Pronto.)   [place] [facility] [place]   [phone]

## 2018-09-26 NOTE — Progress Notes (Signed)
Edward Todd   [phone] [phone]   Clinic Follow up Note   Patient Care Team: Edward Pepper, MD as PCP - General (Family Medicine)  Date of Service:  09/27/2018  CHIEF COMPLAINT: F/u of MM  SUMMARY OF ONCOLOGIC HISTORY: Oncology History   Cancer Staging Multiple myeloma ([place]) Staging form: Plasma Cell Myeloma and Plasma Cell Disorders, AJCC 8th Edition - Clinical stage from 09/04/2018: RISS Stage III (Beta-2-microglobulin (mg/L): 5.5, Albumin (g/dL): 2.6, ISS: Stage III, High-risk cytogenetics: Absent, LDH: Elevated) - Signed by Truitt Merle, MD on 09/26/2018       Multiple myeloma ([place])   08/31/2018 Initial Diagnosis    Multiple myeloma ([place])    09/04/2018 Cancer Staging    Staging form: Plasma Cell Myeloma and Plasma Cell Disorders, AJCC 8th Edition - Clinical stage from 09/04/2018: RISS Stage III (Beta-2-microglobulin (mg/L): 5.5, Albumin (g/dL): 2.6, ISS: Stage III, High-risk cytogenetics: Absent, LDH: Elevated) - Signed by Truitt Merle, MD on 09/26/2018    09/04/2018 Initial Biopsy    Diagnosis 09/04/18 Bone Marrow, Aspirate,Biopsy, and Clot, left posterior iliac crest BONE MARROW: - CELLULAR MARROW WITH INVOLVEMENT BY PLASMA CELL NEOPLASM (>95%) - SEE COMMENT PERIPHERAL BLOOD: - NORMOCYTIC ANEMIA - ROULEAUX FORMATION - SEE COMPLETE BLOOD COUNT    09/05/2018 -  Chemotherapy    Velcade injection and dexamethasone 54m weekly starting 09/05/18    09/14/2018 -  Chemotherapy    Revlimid 275m2 weeks on/1 week off starting 09/14/18       CURRENT THERAPY:  1. Weekly velcade injection and dexamethasone 4029mtarting 09/05/18, Revlimid 69m75mily 2 weeks on and1 week off starting 09/14/18 2. Monthly zometa started on 09/05/2018  INTERVAL HISTORY:  Edward Todd for a follow up of MM. He presents to the clinic alone. I initially saw him in the hospital and he is here of his first visit after hospital discharge. He called his wife to be  included in the visit today. He completed rehab and is back at home 2 days ago. He notes since discharge he is doing well. He is able to ambulate around the house with walker. He spends 15-20 minutes standing and walking at a time, but not often.  He notes with Edward Todd and Edward Todd q6hours as needed which is mostly controlling his pain. He plans to get Edward Todd for pain. He tries to continue using medication on a schedule so he does not over use it.  He notes he does not want to take Edward Todd as this is not helping him. He is able to eat well and sleep well. He is also on Edward Todd BID with meal. He denies any abdominal pain. He notes he has gas today.     REVIEW OF SYSTEMS:   Constitutional: Denies fevers, chills or abnormal weight loss Eyes: Denies blurriness of vision Ears, nose, mouth, throat, and face: Denies mucositis or sore throat Respiratory: Denies cough, dyspnea or wheezes Cardiovascular: Denies palpitation, chest discomfort or lower extremity swelling Gastrointestinal:  Denies nausea, heartburn or change in bowel habits (+) gas Skin: Denies abnormal skin rashes MSK: (+) Back pain from compression fracture, ambulated with walker  Lymphatics: Denies new lymphadenopathy or easy bruising Neurological:Denies numbness, tingling or new weaknesses Behavioral/Psych: Mood is stable, no new changes  All other systems were reviewed with the patient and are negative.  MEDICAL HISTORY:  Past Medical History:  Diagnosis Date  . Diabetes mellitus without complication (HCC)[place]. Hypertension     SURGICAL HISTORY: History  reviewed. No pertinent surgical history.  I have reviewed the social history and family history with the patient and they are unchanged from previous note.  ALLERGIES:  is allergic to shellfish allergy.  MEDICATIONS:  Current Outpatient Medications  Medication Sig Dispense Refill  . acetaminophen (Edward Todd) 325 MG tablet Take 2 tablets (650 mg total) by mouth every 6  (six) hours as needed for mild pain or fever.    Marland Kitchen acyclovir (ZOVIRAX) 400 MG tablet Take 1 tablet (400 mg total) by mouth 2 (two) times daily. 60 tablet 0  . albuterol (PROVENTIL HFA;VENTOLIN HFA) 108 (90 Base) MCG/ACT inhaler Inhale 2 puffs into the lungs every 4 (four) hours as needed for wheezing or shortness of breath (cough, shortness of breath or wheezing.). 1 Inhaler 1  . aspirin 81 MG chewable tablet Chew 1 tablet (81 mg total) by mouth daily.    . calcium-vitamin D (OSCAL WITH D) 500-200 MG-UNIT tablet Take 1 tablet by mouth 2 (two) times daily. 60 tablet 1  . Cyanocobalamin (B-12 PO) Take 1 tablet by mouth daily.    . Edward Todd (GLUCOPHAGE) 1000 MG tablet Take 1 tablet (1,000 mg total) by mouth 2 (two) times daily with a meal. 60 tablet 0  . methocarbamol (Edward Todd) 750 MG tablet Take 1 tablet (750 mg total) by mouth 3 (three) times daily. 90 tablet 0  . ondansetron (ZOFRAN-ODT) 4 MG disintegrating tablet Take 1 tablet (4 mg total) by mouth every 6 (six) hours as needed for nausea or vomiting. 20 tablet 0  . Edward Todd HCl 10 MG TABS Take 1 tablet (10 mg total) by mouth every 6 (six) hours as needed. 60 tablet 0  . pantoprazole (PROTONIX) 40 MG tablet Take 1 tablet (40 mg total) by mouth daily at 6 (six) AM. 30 tablet 0  . polyethylene glycol (MIRALAX / GLYCOLAX) 17 g packet Take 17 g by mouth daily. 14 each 0  . senna-docusate (SENOKOT-S) 8.6-50 MG tablet Take 1 tablet by mouth 2 (two) times daily.    Marland Kitchen dexamethasone (DECADRON) 4 MG tablet Take 10 tablets (40 mg total) by mouth once a week. 40 tablet 2  . lenalidomide (REVLIMID) 25 MG capsule Take 1 capsule (25 mg total) by mouth daily. Take for 14 days on, 7 days off, repeat every 21 days. [employer] auth# [id number] 09/27/2018 14 capsule 0   No current facility-administered medications for this visit.     PHYSICAL EXAMINATION: ECOG PERFORMANCE STATUS: 3 - Symptomatic, >50% confined to bed  Vitals:   09/27/18 0954  BP: (!) 133/95   Pulse: (!) 107  Resp: 18  Temp: (!) 97.1 F (36.2 C)  SpO2: 100%   Filed Weights    GENERAL:alert, no distress and comfortable SKIN: skin color, texture, turgor are normal, no rashes or significant lesions EYES: normal, Conjunctiva are pink and non-injected, sclera clear  NECK: supple, thyroid normal size, non-tender, without nodularity LYMPH:  no palpable lymphadenopathy in the cervical, axillary  LUNGS: clear to auscultation and percussion with normal breathing effort HEART: regular rate & rhythm and no murmurs and no lower extremity edema ABDOMEN:abdomen soft, non-tender and normal bowel sounds Musculoskeletal:no cyanosis of digits and no clubbing  NEURO: alert & oriented x 3 with fluent speech, no focal motor/sensory deficits  LABORATORY DATA:  I have reviewed the data as listed CBC Latest Ref Rng & Units 09/27/2018 09/24/2018 09/19/2018  WBC 4.0 - 10.5 K/uL 5.1 4.5 5.0  Hemoglobin 13.0 - 17.0 g/dL 8.5(L) 8.9(L) 8.2(L)  Hematocrit 39.0 -  52.0 % 25.8(L) 27.0(L) 25.1(L)  Platelets 150 - 400 K/uL 231 301 371     CMP Latest Ref Rng & Units 09/27/2018 09/24/2018 09/19/2018  Glucose 70 - 99 mg/dL 214(H) 141(H) 99  BUN 6 - 20 mg/dL 21(H) 29(H) 29(H)  Creatinine 0.61 - 1.24 mg/dL 1.17 1.24 1.32(H)  Sodium 135 - 145 mmol/L 132(L) 132(L) 131(L)  Potassium 3.5 - 5.1 mmol/L 4.0 4.5 4.6  Chloride 98 - 111 mmol/L 101 99 99  CO2 22 - 32 mmol/L 19(L) 23 21(L)  Calcium 8.9 - 10.3 mg/dL 8.7(L) 9.4 9.1  Total Protein 6.5 - 8.1 g/dL 11.2(H) - 11.2(H)  Total Bilirubin 0.3 - 1.2 mg/dL 0.4 - 0.6  Alkaline Phos 38 - 126 U/L 95 - 65  AST 15 - 41 U/L 14(L) - 26  ALT 0 - 44 U/L 24 - 42    RADIOGRAPHIC STUDIES: I have personally reviewed the radiological images as listed and agreed with the findings in the report. No results found.   ASSESSMENT & PLAN:  Edward Todd is a 44 y.o. male with    1. Multiple Myeloma, IgG Kappa, stage III, trisomy 11, standard risk -He was hospitalized  initially for anemia and severe back pain. Work up confirmed multiple myeloma, unfortunately he has multiple compression fracture of thoracic and lumbar spine. -I started him on induction chemo with RVD: weekly Velcade injections with Dexamethasone on 09/05/18 and oral Revlimid on 09/14/18. -He is currently on day 14 of cycle 1 of Revlimid. He will be off for 1 week starting tomorrow.  -we discussed his cytogenetics and FISH results, which showed trisomy 11, FISH panel otherwise negative, this is considered a standard risk.  -He is a candidate for stem cell transplant, I will refer him to [facility] for transplant consultation.  Given his standard risk disease, he may respond well to induction chemotherapy, bone marrow transplant could be deferred if he has good response to induction chemo.  -Labs reviewed, CBC and CMP WNL except Hg 8.5, BG 214, BUN 21, Ca 8.7, Protein 11.2, albumin 3. MM panel still pending. His protein is improving which indicates good response to treatment. Will proceed with Velcade injection today.  -I recommend PET scan for base line. He is agreeable. -MM panel was repeated today, if his M-protein does not drop significantly, I will change velcade to day 1, 4, 8,11,  22, 25, 29,32 of 42 day cycle  -f/u in 3 weeks    2. Anemia, secondary to MM  -He required blood transfusion 09/12/18.  -Labs reviewed, Hg at 8.5 today (09/27/18) -Continue to monitor.    3. Severe low back pain  -secondary to thoracic and lumbar compression fractures, lumbar spine stenosisseen on 08/31/18 bone survey.  -He has completed rehab and currently doing PT  -He is on Edward Todd 714m and Edward Todd 166mq6hours as needed which is mostly controlling his pain. He plans to get Edward Todd for breakthrough pain.  -I discussed to reduce longterm use of narcotics I recommend he use half tablet if pain is improving. Will try half tablets intermittently starting next week to see if pain remains controlled.  -I  encouraged him to continue stool softeners as needed to prevent constipation from pain meds.  -I will refer him to see orthopedic surgeon    4. DM, Type II -On Edward Todd BID -BG at 214 today (09/27/18). I encouraged him to f/u with his PCP for better management.  -I also encouraged him to reduce carbohydrates, juice, soft drinks  in diet.  -Will monitor on dexamethasone.   5. Financial support -I will fill out work form for him today  -I offered him our [facility] resources that are available to him.  -He does having insurance and he does plan to apply for disability.    PLAN:  -I refilled Edward Todd today  -He is on Day 14 of cycle 1 of Revlimid. He will be off for 1 week starting tomorrow.  -I will fill out work form for him -PET in 1-2 weeks  -Referral to [facility] surgery  -Lab and weekly velcade injection X6 -F/u in 3 weeks    No problem-specific Assessment & Plan notes found for this encounter.   Orders Placed This Encounter  Procedures  . NM PET Image Initial (PI) Whole Body    Standing Status:   Future    Standing Expiration Date:   09/27/2019    Order Specific Question:   If indicated for the ordered procedure, I authorize the administration of a radiopharmaceutical per Radiology protocol    Answer:   Yes    Order Specific Question:   Radiology Contrast Protocol - do NOT remove file path    Answer:   [email or web address]  . AMB referral to orthopedics    Referral Priority:   Routine    Referral Type:   Consultation    Number of Visits Requested:   1   All questions were answered. The patient knows to call the clinic with any problems, questions or concerns. No barriers to learning was detected. I spent 30 minutes counseling the patient face to face. The total time spent in the appointment was 40 minutes and more than 50% was on counseling and review of test results     Truitt Merle, MD 09/27/2018   I, Joslyn Devon, am acting as scribe for  Truitt Merle, MD.   I have reviewed the above documentation for accuracy and completeness, and I agree with the above.

## 2018-09-27 ENCOUNTER — Encounter: Payer: Self-pay | Admitting: Hematology

## 2018-09-27 ENCOUNTER — Other Ambulatory Visit: Payer: Self-pay

## 2018-09-27 ENCOUNTER — Ambulatory Visit: Payer: 59

## 2018-09-27 ENCOUNTER — Inpatient Hospital Stay: Payer: 59

## 2018-09-27 ENCOUNTER — Inpatient Hospital Stay: Payer: 59 | Attending: Hematology | Admitting: Hematology

## 2018-09-27 VITALS — BP 133/95 | HR 107 | Temp 97.1°F | Resp 18 | Ht 75.51 in

## 2018-09-27 VITALS — HR 99

## 2018-09-27 DIAGNOSIS — C9000 Multiple myeloma not having achieved remission: Secondary | ICD-10-CM

## 2018-09-27 DIAGNOSIS — Z7982 Long term (current) use of aspirin: Secondary | ICD-10-CM | POA: Insufficient documentation

## 2018-09-27 DIAGNOSIS — I10 Essential (primary) hypertension: Secondary | ICD-10-CM | POA: Diagnosis not present

## 2018-09-27 DIAGNOSIS — Z5111 Encounter for antineoplastic chemotherapy: Secondary | ICD-10-CM | POA: Insufficient documentation

## 2018-09-27 DIAGNOSIS — D630 Anemia in neoplastic disease: Secondary | ICD-10-CM | POA: Diagnosis not present

## 2018-09-27 DIAGNOSIS — Z7984 Long term (current) use of oral hypoglycemic drugs: Secondary | ICD-10-CM | POA: Insufficient documentation

## 2018-09-27 DIAGNOSIS — Z9221 Personal history of antineoplastic chemotherapy: Secondary | ICD-10-CM | POA: Diagnosis not present

## 2018-09-27 DIAGNOSIS — E119 Type 2 diabetes mellitus without complications: Secondary | ICD-10-CM

## 2018-09-27 DIAGNOSIS — K5900 Constipation, unspecified: Secondary | ICD-10-CM | POA: Insufficient documentation

## 2018-09-27 DIAGNOSIS — M545 Low back pain: Secondary | ICD-10-CM | POA: Diagnosis not present

## 2018-09-27 DIAGNOSIS — Z79899 Other long term (current) drug therapy: Secondary | ICD-10-CM | POA: Diagnosis not present

## 2018-09-27 LAB — CMP (CANCER CENTER ONLY)
ALT: 24 U/L (ref 0–44)
AST: 14 U/L — ABNORMAL LOW (ref 15–41)
Albumin: 3 g/dL — ABNORMAL LOW (ref 3.5–5.0)
Alkaline Phosphatase: 95 U/L (ref 38–126)
Anion gap: 12 (ref 5–15)
BUN: 21 mg/dL — ABNORMAL HIGH (ref 6–20)
CO2: 19 mmol/L — ABNORMAL LOW (ref 22–32)
Calcium: 8.7 mg/dL — ABNORMAL LOW (ref 8.9–10.3)
Chloride: 101 mmol/L (ref 98–111)
Creatinine: 1.17 mg/dL (ref 0.61–1.24)
GFR, Est AFR Am: >60 mL/min (ref >60)
GFR, Estimated: >60 mL/min (ref >60)
Glucose, Bld: 214 mg/dL — ABNORMAL HIGH (ref 70–99)
Potassium: 4 mmol/L (ref 3.5–5.1)
Sodium: 132 mmol/L — ABNORMAL LOW (ref 135–145)
Total Bilirubin: 0.4 mg/dL (ref 0.3–1.2)
Total Protein: 11.2 g/dL — ABNORMAL HIGH (ref 6.5–8.1)

## 2018-09-27 LAB — CBC WITH DIFFERENTIAL (CANCER CENTER ONLY)
Abs Immature Granulocytes: 0.01 10*3/uL (ref 0.00–0.07)
Basophils Absolute: 0 10*3/uL (ref 0.0–0.1)
Basophils Relative: 0 %
Eosinophils Absolute: 0.1 10*3/uL (ref 0.0–0.5)
Eosinophils Relative: 3 %
HCT: 25.8 % — ABNORMAL LOW (ref 39.0–52.0)
Hemoglobin: 8.5 g/dL — ABNORMAL LOW (ref 13.0–17.0)
Immature Granulocytes: 0 %
Lymphocytes Relative: 43 %
Lymphs Abs: 2.2 10*3/uL (ref 0.7–4.0)
MCH: 29.8 pg (ref 26.0–34.0)
MCHC: 32.9 g/dL (ref 30.0–36.0)
MCV: 90.5 fL (ref 80.0–100.0)
Monocytes Absolute: 0.6 10*3/uL (ref 0.1–1.0)
Monocytes Relative: 11 %
Neutro Abs: 2.2 10*3/uL (ref 1.7–7.7)
Neutrophils Relative %: 43 %
Platelet Count: 231 10*3/uL (ref 150–400)
RBC: 2.85 MIL/uL — ABNORMAL LOW (ref 4.22–5.81)
RDW: 14.3 % (ref 11.5–15.5)
WBC Count: 5.1 10*3/uL (ref 4.0–10.5)
nRBC: 0 % (ref 0.0–0.2)

## 2018-09-27 MED ORDER — DEXAMETHASONE 4 MG PO TABS
40.0000 mg | ORAL_TABLET | Freq: Once | ORAL | Status: AC
  Administered 2018-09-27: 40 mg via ORAL

## 2018-09-27 MED ORDER — PROCHLORPERAZINE MALEATE 10 MG PO TABS
10.0000 mg | ORAL_TABLET | Freq: Once | ORAL | Status: AC
  Administered 2018-09-27: 10 mg via ORAL

## 2018-09-27 MED ORDER — PROCHLORPERAZINE MALEATE 10 MG PO TABS
ORAL_TABLET | ORAL | Status: AC
  Filled 2018-09-27: qty 1

## 2018-09-27 MED ORDER — LENALIDOMIDE 25 MG PO CAPS: 25.0000 mg | ORAL_CAPSULE | Freq: Every day | ORAL | 0 refills | Status: DC

## 2018-09-27 MED ORDER — DEXAMETHASONE 4 MG PO TABS: 40.0000 mg | ORAL_TABLET | ORAL | 2 refills | Status: DC

## 2018-09-27 MED ORDER — DEXAMETHASONE 4 MG PO TABS
ORAL_TABLET | ORAL | Status: AC
  Filled 2018-09-27: qty 10

## 2018-09-27 MED ORDER — BORTEZOMIB CHEMO SQ INJECTION 3.5 MG (2.5MG/ML)
1.3000 mg/m2 | Freq: Once | INTRAMUSCULAR | Status: AC
  Administered 2018-09-27: 3.5 mg via SUBCUTANEOUS
  Filled 2018-09-27: qty 1.4

## 2018-09-27 MED ORDER — OXYCODONE HCL 10 MG PO TABS: 10.0000 mg | ORAL_TABLET | Freq: Four times a day (QID) | ORAL | 0 refills | Status: DC | PRN

## 2018-09-27 NOTE — Patient Instructions (Signed)
[facility] Discharge Instructions for Patients Receiving Chemotherapy  Today you received the following chemotherapy agents: Velcade  To help prevent nausea and vomiting after your treatment, we encourage you to take your nausea medication as directed.    If you develop nausea and vomiting that is not controlled by your nausea medication, call the clinic.   BELOW ARE SYMPTOMS THAT SHOULD BE REPORTED IMMEDIATELY:  *FEVER GREATER THAN 100.5 F  *CHILLS WITH OR WITHOUT FEVER  NAUSEA AND VOMITING THAT IS NOT CONTROLLED WITH YOUR NAUSEA MEDICATION  *UNUSUAL SHORTNESS OF BREATH  *UNUSUAL BRUISING OR BLEEDING  TENDERNESS IN MOUTH AND THROAT WITH OR WITHOUT PRESENCE OF ULCERS  *URINARY PROBLEMS  *BOWEL PROBLEMS  UNUSUAL RASH Items with * indicate a potential emergency and should be followed up as soon as possible.  Feel free to call the clinic should you have any questions or concerns. The clinic phone number is [phone].  Please show the [facility] at check-in to the Emergency Department and triage nurse.     Bortezomib injection What is this medicine? BORTEZOMIB (bor TEZ oh mib) is a medicine that targets proteins in cancer cells and stops the cancer cells from growing. It is used to treat multiple myeloma and mantle-cell lymphoma. This medicine may be used for other purposes; ask your health care provider or pharmacist if you have questions. COMMON BRAND NAME(S): Velcade What should I tell my health care provider before I take this medicine? They need to know if you have any of these conditions: -diabetes -heart disease -irregular heartbeat -liver disease -on hemodialysis -low blood counts, like low white blood cells, platelets, or hemoglobin -peripheral neuropathy -taking medicine for blood pressure -an unusual or allergic reaction to bortezomib, mannitol, boron, other medicines, foods, dyes, or preservatives -pregnant or trying to get  pregnant -breast-feeding How should I use this medicine? This medicine is for injection into a vein or for injection under the skin. It is given by a health care professional in a hospital or clinic setting. Talk to your pediatrician regarding the use of this medicine in children. Special care may be needed. Overdosage: If you think you have taken too much of this medicine contact a poison control center or emergency room at once. NOTE: This medicine is only for you. Do not share this medicine with others. What if I miss a dose? It is important not to miss your dose. Call your doctor or health care professional if you are unable to keep an appointment. What may interact with this medicine? This medicine may interact with the following medications: -ketoconazole -rifampin -ritonavir -St. John's Wort This list may not describe all possible interactions. Give your health care provider a list of all the medicines, herbs, non-prescription drugs, or dietary supplements you use. Also tell them if you smoke, drink alcohol, or use illegal drugs. Some items may interact with your medicine. What should I watch for while using this medicine? You may get drowsy or dizzy. Do not drive, use machinery, or do anything that needs mental alertness until you know how this medicine affects you. Do not stand or sit up quickly, especially if you are an older patient. This reduces the risk of dizzy or fainting spells. In some cases, you may be given additional medicines to help with side effects. Follow all directions for their use. Call your doctor or health care professional for advice if you get a fever, chills or sore throat, or other symptoms of a cold or  flu. Do not treat yourself. This drug decreases your body's ability to fight infections. Try to avoid being around people who are sick. This medicine may increase your risk to bruise or bleed. Call your doctor or health care professional if you notice any unusual  bleeding. You may need blood work done while you are taking this medicine. In some patients, this medicine may cause a serious brain infection that may cause death. If you have any problems seeing, thinking, speaking, walking, or standing, tell your doctor right away. If you cannot reach your doctor, urgently seek other source of medical care. Check with your doctor or health care professional if you get an attack of severe diarrhea, nausea and vomiting, or if you sweat a lot. The loss of too much body fluid can make it dangerous for you to take this medicine. Do not become pregnant while taking this medicine or for at least 7 months after stopping it. Women should inform their doctor if they wish to become pregnant or think they might be pregnant. Men should not father a child while taking this medicine and for at least 4 months after stopping it. There is a potential for serious side effects to an unborn child. Talk to your health care professional or pharmacist for more information. Do not breast-feed an infant while taking this medicine or for 2 months after stopping it. This medicine may interfere with the ability to have a child. You should talk with your doctor or health care professional if you are concerned about your fertility. What side effects may I notice from receiving this medicine? Side effects that you should report to your doctor or health care professional as soon as possible: -allergic reactions like skin rash, itching or hives, swelling of the face, lips, or tongue -breathing problems -changes in hearing -changes in vision -fast, irregular heartbeat -feeling faint or lightheaded, falls -pain, tingling, numbness in the hands or feet -right upper belly pain -seizures -swelling of the ankles, feet, hands -unusual bleeding or bruising -unusually weak or tired -vomiting -yellowing of the eyes or skin Side effects that usually do not require medical attention (report to your  doctor or health care professional if they continue or are bothersome): -changes in emotions or moods -constipation -diarrhea -loss of appetite -headache -irritation at site where injected -nausea This list may not describe all possible side effects. Call your doctor for medical advice about side effects. You may report side effects to FDA at 1-800-FDA-1088. Where should I keep my medicine? This drug is given in a hospital or clinic and will not be stored at home. NOTE: This sheet is a summary. It may not cover all possible information. If you have questions about this medicine, talk to your doctor, pharmacist, or health care provider.  2019 Elsevier/Gold Standard (2017-08-21 16:29:31)

## 2018-09-27 NOTE — Progress Notes (Signed)
Faxed script for Revlimid and Celgene authorization to [facility] at [phone], sent to HIM for scanning to chart.

## 2018-09-27 NOTE — Progress Notes (Signed)
Per Dr. Burr Medico, pt did not take his PO Dexamethasone dose at home.  Dr. Burr Medico would like pt to get his dose here at [facility] today. Kennith Center, Pharm.D., CPP 09/27/2018@10 :39 AM

## 2018-09-28 ENCOUNTER — Other Ambulatory Visit: Payer: Self-pay

## 2018-09-28 ENCOUNTER — Telehealth: Payer: Self-pay | Admitting: Hematology

## 2018-09-28 DIAGNOSIS — C9000 Multiple myeloma not having achieved remission: Secondary | ICD-10-CM

## 2018-09-28 LAB — KAPPA/LAMBDA LIGHT CHAINS
Kappa free light chain: 165.2 mg/L — ABNORMAL HIGH (ref 3.3–19.4)
Kappa, lambda light chain ratio: 21.74 — ABNORMAL HIGH (ref 0.26–1.65)
Lambda free light chains: 7.6 mg/L (ref 5.7–26.3)

## 2018-09-28 NOTE — Telephone Encounter (Signed)
Scheduled appt per 6/4 los. Left a voice message of appt date and time. °

## 2018-10-01 LAB — MULTIPLE MYELOMA PANEL, SERUM
Albumin SerPl Elph-Mcnc: 3.8 g/dL (ref 2.9–4.4)
Albumin/Glob SerPl: 0.6 — ABNORMAL LOW (ref 0.7–1.7)
Alpha 1: 0.4 g/dL (ref 0.0–0.4)
Alpha2 Glob SerPl Elph-Mcnc: 1.2 g/dL — ABNORMAL HIGH (ref 0.4–1.0)
B-Globulin SerPl Elph-Mcnc: 5.2 g/dL — ABNORMAL HIGH (ref 0.7–1.3)
Gamma Glob SerPl Elph-Mcnc: 0.1 g/dL — ABNORMAL LOW (ref 0.4–1.8)
Globulin, Total: 6.9 g/dL — ABNORMAL HIGH (ref 2.2–3.9)
IgA: 32 mg/dL — ABNORMAL LOW (ref 90–386)
IgG (Immunoglobin G), Serum: 4655 mg/dL — ABNORMAL HIGH (ref 603–1613)
IgM (Immunoglobulin M), Srm: 29 mg/dL (ref 20–172)
M Protein SerPl Elph-Mcnc: 4 g/dL — ABNORMAL HIGH
Total Protein ELP: 10.7 g/dL — ABNORMAL HIGH (ref 6.0–8.5)

## 2018-10-02 ENCOUNTER — Inpatient Hospital Stay: Admission: RE | Admit: 2018-10-02 | Payer: Self-pay | Source: Ambulatory Visit

## 2018-10-02 ENCOUNTER — Other Ambulatory Visit: Payer: Self-pay | Admitting: Hematology

## 2018-10-02 ENCOUNTER — Telehealth: Payer: Self-pay | Admitting: *Deleted

## 2018-10-02 NOTE — Telephone Encounter (Signed)
Received vm call from pt stating that he was returning a missed call & also needed refill on his oxycodone & he has 3 left.  Called pharmacy b/c script sent by Dr Burr Medico 09/27/18 for refill on 10/04/18 seen.  Pharmacy states pt had 10 days supply from Dr Oval Linsey office that he picked up 6/1 & paid cash for b/c prior auth was needed.  Was informed for 6/11 script prior auth will need to be done.  Message left for [facility] to check for prior auth.  Left message for pt to call back.

## 2018-10-03 ENCOUNTER — Other Ambulatory Visit: Payer: Self-pay | Admitting: *Deleted

## 2018-10-03 NOTE — Telephone Encounter (Addendum)
Have received multiple calls from pt today regarding refill on his pain meds.  He states that he is down to one pill from script that was given on 09/24/18 by Hospitalist that was for 1 tab three x /daily for pain.  He received # 30.  He states he has taken sometimes 4 x/d per DR Feng's orders.  Script written 09/27/18 by Dr Burr Medico states refill on 10/04/18.  [name] has been received by Pharmacy.  ? OK to refill 1 day early.  Will need to call pharmacy.  Message to Dr Burr Medico.  Discussed with Dr Burr Medico & OK to refill 1 day early.  Notified pharmacy & pt.

## 2018-10-04 ENCOUNTER — Other Ambulatory Visit: Payer: Self-pay

## 2018-10-04 ENCOUNTER — Inpatient Hospital Stay: Payer: 59

## 2018-10-04 VITALS — BP 127/78 | HR 84 | Temp 98.5°F | Resp 17

## 2018-10-04 DIAGNOSIS — C9000 Multiple myeloma not having achieved remission: Secondary | ICD-10-CM

## 2018-10-04 DIAGNOSIS — Z5111 Encounter for antineoplastic chemotherapy: Secondary | ICD-10-CM | POA: Diagnosis not present

## 2018-10-04 LAB — CMP (CANCER CENTER ONLY)
ALT: 17 U/L (ref 0–44)
AST: 10 U/L — ABNORMAL LOW (ref 15–41)
Albumin: 2.7 g/dL — ABNORMAL LOW (ref 3.5–5.0)
Alkaline Phosphatase: 82 U/L (ref 38–126)
Anion gap: 10 (ref 5–15)
BUN: 14 mg/dL (ref 6–20)
CO2: 20 mmol/L — ABNORMAL LOW (ref 22–32)
Calcium: 8.6 mg/dL — ABNORMAL LOW (ref 8.9–10.3)
Chloride: 104 mmol/L (ref 98–111)
Creatinine: 0.98 mg/dL (ref 0.61–1.24)
GFR, Est AFR Am: >60 mL/min (ref >60)
GFR, Estimated: >60 mL/min (ref >60)
Glucose, Bld: 211 mg/dL — ABNORMAL HIGH (ref 70–99)
Potassium: 3.9 mmol/L (ref 3.5–5.1)
Sodium: 134 mmol/L — ABNORMAL LOW (ref 135–145)
Total Bilirubin: 0.3 mg/dL (ref 0.3–1.2)
Total Protein: 9.2 g/dL — ABNORMAL HIGH (ref 6.5–8.1)

## 2018-10-04 LAB — CBC WITH DIFFERENTIAL (CANCER CENTER ONLY)
Abs Immature Granulocytes: 0.01 10*3/uL (ref 0.00–0.07)
Basophils Absolute: 0 10*3/uL (ref 0.0–0.1)
Basophils Relative: 0 %
Eosinophils Absolute: 0 10*3/uL (ref 0.0–0.5)
Eosinophils Relative: 1 %
HCT: 25.3 % — ABNORMAL LOW (ref 39.0–52.0)
Hemoglobin: 8.6 g/dL — ABNORMAL LOW (ref 13.0–17.0)
Immature Granulocytes: 0 %
Lymphocytes Relative: 42 %
Lymphs Abs: 1.9 10*3/uL (ref 0.7–4.0)
MCH: 30.3 pg (ref 26.0–34.0)
MCHC: 34 g/dL (ref 30.0–36.0)
MCV: 89.1 fL (ref 80.0–100.0)
Monocytes Absolute: 0.3 10*3/uL (ref 0.1–1.0)
Monocytes Relative: 6 %
Neutro Abs: 2.3 10*3/uL (ref 1.7–7.7)
Neutrophils Relative %: 51 %
Platelet Count: 199 10*3/uL (ref 150–400)
RBC: 2.84 MIL/uL — ABNORMAL LOW (ref 4.22–5.81)
RDW: 14.9 % (ref 11.5–15.5)
WBC Count: 4.5 10*3/uL (ref 4.0–10.5)
nRBC: 0 % (ref 0.0–0.2)

## 2018-10-04 MED ORDER — PROCHLORPERAZINE MALEATE 10 MG PO TABS
10.0000 mg | ORAL_TABLET | Freq: Once | ORAL | Status: AC
  Administered 2018-10-04: 10 mg via ORAL

## 2018-10-04 MED ORDER — SODIUM CHLORIDE 0.9 % IV SOLN
INTRAVENOUS | Status: DC
  Administered 2018-10-04: 10:00:00 via INTRAVENOUS
  Filled 2018-10-04: qty 250

## 2018-10-04 MED ORDER — BORTEZOMIB CHEMO SQ INJECTION 3.5 MG (2.5MG/ML)
1.3000 mg/m2 | Freq: Once | INTRAMUSCULAR | Status: AC
  Administered 2018-10-04: 3.5 mg via SUBCUTANEOUS
  Filled 2018-10-04: qty 1.4

## 2018-10-04 MED ORDER — PROCHLORPERAZINE MALEATE 10 MG PO TABS
ORAL_TABLET | ORAL | Status: AC
  Filled 2018-10-04: qty 1

## 2018-10-04 MED ORDER — ZOLEDRONIC ACID 4 MG/100ML IV SOLN
4.0000 mg | Freq: Once | INTRAVENOUS | Status: AC
  Administered 2018-10-04: 4 mg via INTRAVENOUS
  Filled 2018-10-04: qty 100

## 2018-10-04 NOTE — Patient Instructions (Signed)
[facility] Discharge Instructions for Patients Receiving Chemotherapy  Today you received the following chemotherapy agents: Velcade  To help prevent nausea and vomiting after your treatment, we encourage you to take your nausea medication as directed.    If you develop nausea and vomiting that is not controlled by your nausea medication, call the clinic.   BELOW ARE SYMPTOMS THAT SHOULD BE REPORTED IMMEDIATELY:  *FEVER GREATER THAN 100.5 F  *CHILLS WITH OR WITHOUT FEVER  NAUSEA AND VOMITING THAT IS NOT CONTROLLED WITH YOUR NAUSEA MEDICATION  *UNUSUAL SHORTNESS OF BREATH  *UNUSUAL BRUISING OR BLEEDING  TENDERNESS IN MOUTH AND THROAT WITH OR WITHOUT PRESENCE OF ULCERS  *URINARY PROBLEMS  *BOWEL PROBLEMS  UNUSUAL RASH Items with * indicate a potential emergency and should be followed up as soon as possible.  Feel free to call the clinic should you have any questions or concerns. The clinic phone number is [phone].  Please show the [facility] at check-in to the Emergency Department and triage nurse.     Bortezomib injection What is this medicine? BORTEZOMIB (bor TEZ oh mib) is a medicine that targets proteins in cancer cells and stops the cancer cells from growing. It is used to treat multiple myeloma and mantle-cell lymphoma. This medicine may be used for other purposes; ask your health care provider or pharmacist if you have questions. COMMON BRAND NAME(S): Velcade What should I tell my health care provider before I take this medicine? They need to know if you have any of these conditions: -diabetes -heart disease -irregular heartbeat -liver disease -on hemodialysis -low blood counts, like low white blood cells, platelets, or hemoglobin -peripheral neuropathy -taking medicine for blood pressure -an unusual or allergic reaction to bortezomib, mannitol, boron, other medicines, foods, dyes, or preservatives -pregnant or trying to get  pregnant -breast-feeding How should I use this medicine? This medicine is for injection into a vein or for injection under the skin. It is given by a health care professional in a hospital or clinic setting. Talk to your pediatrician regarding the use of this medicine in children. Special care may be needed. Overdosage: If you think you have taken too much of this medicine contact a poison control center or emergency room at once. NOTE: This medicine is only for you. Do not share this medicine with others. What if I miss a dose? It is important not to miss your dose. Call your doctor or health care professional if you are unable to keep an appointment. What may interact with this medicine? This medicine may interact with the following medications: -ketoconazole -rifampin -ritonavir -St. John's Wort This list may not describe all possible interactions. Give your health care provider a list of all the medicines, herbs, non-prescription drugs, or dietary supplements you use. Also tell them if you smoke, drink alcohol, or use illegal drugs. Some items may interact with your medicine. What should I watch for while using this medicine? You may get drowsy or dizzy. Do not drive, use machinery, or do anything that needs mental alertness until you know how this medicine affects you. Do not stand or sit up quickly, especially if you are an older patient. This reduces the risk of dizzy or fainting spells. In some cases, you may be given additional medicines to help with side effects. Follow all directions for their use. Call your doctor or health care professional for advice if you get a fever, chills or sore throat, or other symptoms of a cold or  flu. Do not treat yourself. This drug decreases your body's ability to fight infections. Try to avoid being around people who are sick. This medicine may increase your risk to bruise or bleed. Call your doctor or health care professional if you notice any unusual  bleeding. You may need blood work done while you are taking this medicine. In some patients, this medicine may cause a serious brain infection that may cause death. If you have any problems seeing, thinking, speaking, walking, or standing, tell your doctor right away. If you cannot reach your doctor, urgently seek other source of medical care. Check with your doctor or health care professional if you get an attack of severe diarrhea, nausea and vomiting, or if you sweat a lot. The loss of too much body fluid can make it dangerous for you to take this medicine. Do not become pregnant while taking this medicine or for at least 7 months after stopping it. Women should inform their doctor if they wish to become pregnant or think they might be pregnant. Men should not father a child while taking this medicine and for at least 4 months after stopping it. There is a potential for serious side effects to an unborn child. Talk to your health care professional or pharmacist for more information. Do not breast-feed an infant while taking this medicine or for 2 months after stopping it. This medicine may interfere with the ability to have a child. You should talk with your doctor or health care professional if you are concerned about your fertility. What side effects may I notice from receiving this medicine? Side effects that you should report to your doctor or health care professional as soon as possible: -allergic reactions like skin rash, itching or hives, swelling of the face, lips, or tongue -breathing problems -changes in hearing -changes in vision -fast, irregular heartbeat -feeling faint or lightheaded, falls -pain, tingling, numbness in the hands or feet -right upper belly pain -seizures -swelling of the ankles, feet, hands -unusual bleeding or bruising -unusually weak or tired -vomiting -yellowing of the eyes or skin Side effects that usually do not require medical attention (report to your  doctor or health care professional if they continue or are bothersome): -changes in emotions or moods -constipation -diarrhea -loss of appetite -headache -irritation at site where injected -nausea This list may not describe all possible side effects. Call your doctor for medical advice about side effects. You may report side effects to FDA at 1-800-FDA-1088. Where should I keep my medicine? This drug is given in a hospital or clinic and will not be stored at home. NOTE: This sheet is a summary. It may not cover all possible information. If you have questions about this medicine, talk to your doctor, pharmacist, or health care provider.  2019 Elsevier/Gold Standard (2017-08-21 16:29:31)   Zoledronic Acid injection (Hypercalcemia, Oncology) What is this medicine? ZOLEDRONIC ACID (ZOE le dron ik AS id) lowers the amount of calcium loss from bone. It is used to treat too much calcium in your blood from cancer. It is also used to prevent complications of cancer that has spread to the bone. This medicine may be used for other purposes; ask your health care provider or pharmacist if you have questions. COMMON BRAND NAME(S): Zometa What should I tell my health care provider before I take this medicine? They need to know if you have any of these conditions: -aspirin-sensitive asthma -cancer, especially if you are receiving medicines used to treat cancer -dental disease or  wear dentures -infection -kidney disease -receiving corticosteroids like dexamethasone or prednisone -an unusual or allergic reaction to zoledronic acid, other medicines, foods, dyes, or preservatives -pregnant or trying to get pregnant -breast-feeding How should I use this medicine? This medicine is for infusion into a vein. It is given by a health care professional in a hospital or clinic setting. Talk to your pediatrician regarding the use of this medicine in children. Special care may be needed. Overdosage: If you  think you have taken too much of this medicine contact a poison control center or emergency room at once. NOTE: This medicine is only for you. Do not share this medicine with others. What if I miss a dose? It is important not to miss your dose. Call your doctor or health care professional if you are unable to keep an appointment. What may interact with this medicine? -certain antibiotics given by injection -NSAIDs, medicines for pain and inflammation, like ibuprofen or naproxen -some diuretics like bumetanide, furosemide -teriparatide -thalidomide This list may not describe all possible interactions. Give your health care provider a list of all the medicines, herbs, non-prescription drugs, or dietary supplements you use. Also tell them if you smoke, drink alcohol, or use illegal drugs. Some items may interact with your medicine. What should I watch for while using this medicine? Visit your doctor or health care professional for regular checkups. It may be some time before you see the benefit from this medicine. Do not stop taking your medicine unless your doctor tells you to. Your doctor may order blood tests or other tests to see how you are doing. Women should inform their doctor if they wish to become pregnant or think they might be pregnant. There is a potential for serious side effects to an unborn child. Talk to your health care professional or pharmacist for more information. You should make sure that you get enough calcium and vitamin D while you are taking this medicine. Discuss the foods you eat and the vitamins you take with your health care professional. Some people who take this medicine have severe bone, joint, and/or muscle pain. This medicine may also increase your risk for jaw problems or a broken thigh bone. Tell your doctor right away if you have severe pain in your jaw, bones, joints, or muscles. Tell your doctor if you have any pain that does not go away or that gets worse. Tell  your dentist and dental surgeon that you are taking this medicine. You should not have major dental surgery while on this medicine. See your dentist to have a dental exam and fix any dental problems before starting this medicine. Take good care of your teeth while on this medicine. Make sure you see your dentist for regular follow-up appointments. What side effects may I notice from receiving this medicine? Side effects that you should report to your doctor or health care professional as soon as possible: -allergic reactions like skin rash, itching or hives, swelling of the face, lips, or tongue -anxiety, confusion, or depression -breathing problems -changes in vision -eye pain -feeling faint or lightheaded, falls -jaw pain, especially after dental work -mouth sores -muscle cramps, stiffness, or weakness -redness, blistering, peeling or loosening of the skin, including inside the mouth -trouble passing urine or change in the amount of urine Side effects that usually do not require medical attention (report to your doctor or health care professional if they continue or are bothersome): -bone, joint, or muscle pain -constipation -diarrhea -fever -hair loss -irritation at  site where injected -loss of appetite -nausea, vomiting -stomach upset -trouble sleeping -trouble swallowing -weak or tired This list may not describe all possible side effects. Call your doctor for medical advice about side effects. You may report side effects to FDA at 1-800-FDA-1088. Where should I keep my medicine? This drug is given in a hospital or clinic and will not be stored at home. NOTE: This sheet is a summary. It may not cover all possible information. If you have questions about this medicine, talk to your doctor, pharmacist, or health care provider.  2019 Elsevier/Gold Standard (2013-09-07 14:19:39)

## 2018-10-05 LAB — UIFE/LIGHT CHAINS/TP QN, 24-HR UR
FR KAPPA LT CH,24HR: 62.98 mg/24 hr
FR LAMBDA LT CH,24HR: 7.2 mg/24 hr
Free Kappa Lt Chains,Ur: 34.99 mg/L (ref 0.63–113.79)
Free Kappa/Lambda Ratio: 8.75 (ref 1.03–31.76)
Free Lambda Lt Chains,Ur: 4 mg/L (ref 0.47–11.77)
Total Protein, Urine-Ur/day: 191 mg/24 hr — ABNORMAL HIGH (ref 30–150)
Total Protein, Urine: 10.6 mg/dL
Total Volume: 1800

## 2018-10-08 ENCOUNTER — Encounter: Payer: 59 | Attending: Registered Nurse | Admitting: Registered Nurse

## 2018-10-08 ENCOUNTER — Encounter: Payer: Self-pay | Admitting: Registered Nurse

## 2018-10-08 ENCOUNTER — Other Ambulatory Visit: Payer: Self-pay

## 2018-10-08 VITALS — BP 135/79 | HR 96 | Temp 98.0°F | Resp 14 | Ht 76.0 in

## 2018-10-08 DIAGNOSIS — E119 Type 2 diabetes mellitus without complications: Secondary | ICD-10-CM | POA: Diagnosis not present

## 2018-10-08 DIAGNOSIS — M62838 Other muscle spasm: Secondary | ICD-10-CM | POA: Insufficient documentation

## 2018-10-08 DIAGNOSIS — R5381 Other malaise: Secondary | ICD-10-CM | POA: Diagnosis not present

## 2018-10-08 DIAGNOSIS — C9000 Multiple myeloma not having achieved remission: Secondary | ICD-10-CM

## 2018-10-08 NOTE — Progress Notes (Signed)
Subjective:    Patient ID: Edward Todd, male    DOB: 01-09-1976, 43 y.o.   MRN: [id number]  HPI: Edward Todd is a 43 y.o. male he is here for transitional care visit for  follow up of his Multiple Myeloma, Type 2 DM in non-obese and muscle spasm.  He presented to [facility] on 08/30/2018 with complaints of back pain. He reported he had an accident at work  in March 2020 an oxygen tank fell on his back. He has been out of work since March and presented to the ER with complaints of inability to walk.  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 Subarticular zone narrowing could affect both L5 nerve roots. 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  Chronic appearing deformities of L2 and L4, but no acute or subacute bone marrow edema to suggest a recent injury.  Diffusely abnormal bone marrow signal, with subcentimeter foci of postcontrast enhancement. This could be related to severe anemia, or body habitus, but a marrow replacement process cannot completely be excluded. Hematologic consultation may be warranted.  MRI Thoracic Spine:  IMPRESSION: 1. Diffusely abnormal bone marrow signal highly suspicious for diffuse skeletal multiple myeloma. 2. Pathologic mild-to-moderate thoracic compression fractures T3 and T6 through T10, but no associated marrow edema or complicating features. 3. Normal thoracic spinal cord.  No spinal stenosis. 4. Trace layering pleural effusions.  Bone Survey:  IMPRESSION: Small lucencies within the scapula bilaterally as can be seen with multiple myeloma.  Age-indeterminate T6, T8, T9 and T10 vertebral body compression fractures.  He was admitted to Inpatient Rehabilitation on 09/07/2018 and discharged home on  09/25/2018. He's receiving outpatient therapy with [facility].   He states at this time he is pain free, at times he has lower back pain. He rates his pain 0.  Also reports he has a good appetite.     Pain Inventory Average Pain 3 Pain Right Now 0 My pain is stabbing  In the last 24 hours, has pain interfered with the following? General activity 0 Relation with others 0 Enjoyment of life 3 What TIME of day is your pain at its worst? morning Sleep (in general) Fair  Pain is worse with: walking, some activites and Physical therapy 2x a week and OT once a week Pain improves with: medication Relief from Meds: 10  Mobility Wheelchair and walker  Function employed # of hrs/week 60 hrs  Neuro/Psych No problems in this area  Prior Studies CT/MRI  Physicians involved in your care [facility]   Family History  Problem Relation Age of Onset  . Diabetes Maternal Grandmother   . Diabetes Paternal Grandfather    Social History   Socioeconomic History  . Marital status: Married    Spouse name: Not on file  . Number of children: 1  . Years of education: Not on file  . Highest education level: Not on file  Occupational History  . Not on file  Social Needs  . Financial resource strain: Not on file  . Food insecurity    Worry: Not on file    Inability: Not on file  . Transportation needs    Medical: Not on file    Non-medical: Not on file  Tobacco Use  . Smoking status: Never Smoker  .  Smokeless tobacco: Never Used  Substance and Sexual Activity  . Alcohol use: No    Alcohol/week: 0.0 standard drinks  . Drug use: No  . Sexual activity: Yes  Lifestyle  . Physical activity    Days per week: Not on file    Minutes per session: Not on file  . Stress: Not on file  Relationships  . Social Herbalist on phone: Not on file    Gets together: Not on file    Attends religious service: Not on file    Active member of club or organization: Not  on file    Attends meetings of clubs or organizations: Not on file    Relationship status: Not on file  Other Topics Concern  . Not on file  Social History Narrative   Marital status:  married       Children:  1 child (73yo)      Lives: with son and wife.      Employment: [employer] x 2 months. Moved from [place].      Tobacco:  None      Alcohol: none      Drugs: none      Exercise:  Three days per week.       Seatbelt:  100%; some texting n driving.      Guns:  None      Sexual activity:  5; no STDs; females only.   Education: College         History reviewed. No pertinent surgical history. Past Medical History:  Diagnosis Date  . Diabetes mellitus without complication ([place])   . Hypertension    BP 135/79 Comment: unable to stand  Pulse 96   Temp 98 F (36.7 C)   Resp 14   Ht 6' 4" (1.93 m)   SpO2 98%   BMI 35.07 kg/m   Opioid Risk Score:   Fall Risk Score:  `1  Depression screen PHQ 2/9  Depression screen [facility] 2/9 03/21/2016 02/25/2016 10/09/2015  Decreased Interest 0 0 0  Down, Depressed, Hopeless 0 0 0  PHQ - 2 Score 0 0 0      Review of Systems     Objective:   Physical Exam Vitals signs and nursing note reviewed.  Constitutional:      Appearance: Normal appearance. He is ill-appearing.     Comments: Fragile  Neck:     Musculoskeletal: Normal range of motion and neck supple.  Cardiovascular:     Rate and Rhythm: Normal rate and regular rhythm.     Pulses: Normal pulses.     Heart sounds: Normal heart sounds.  Pulmonary:     Effort: Pulmonary effort is normal.     Breath sounds: Normal breath sounds.  Musculoskeletal:     Comments: Normal Muscle Bulk and Muscle Testing Reveals:  Upper Extremities: Full ROM and Muscle Strength 4/5 Lower Extremities: Full ROM and Muscle Strength 4/5 Arrived in wheelchair  Skin:    General: Skin is warm and dry.  Neurological:     Mental Status: He is alert and oriented to person, place, and time.   Psychiatric:        Mood and Affect: Mood normal.        Behavior: Behavior normal.           Assessment & Plan:  1. Multiple Myeloma: Continue Outpatient Therapy with [facility]. Oncology/ Hematology Following.  2. Type 2 DM: Continue current medication regimen: PCP  Following.  3. Muscle Spasm: Continue current medication Regimen.  4. Debility: Continue Outpatient Therapy. Continue to Monitor.   20 minutes of face to face patient care time was spent during this visit. All questions were encouraged and answered.  F/U in 4-6 weeks with Dr. Posey Pronto.

## 2018-10-10 ENCOUNTER — Encounter: Payer: Self-pay | Admitting: Orthopaedic Surgery

## 2018-10-10 ENCOUNTER — Ambulatory Visit (INDEPENDENT_AMBULATORY_CARE_PROVIDER_SITE_OTHER): Payer: 59 | Admitting: Orthopaedic Surgery

## 2018-10-10 ENCOUNTER — Other Ambulatory Visit: Payer: Self-pay

## 2018-10-10 VITALS — Ht 76.0 in | Wt 275.0 lb

## 2018-10-10 DIAGNOSIS — S22000A Wedge compression fracture of unspecified thoracic vertebra, initial encounter for closed fracture: Secondary | ICD-10-CM | POA: Insufficient documentation

## 2018-10-10 DIAGNOSIS — C9000 Multiple myeloma not having achieved remission: Secondary | ICD-10-CM | POA: Diagnosis not present

## 2018-10-10 NOTE — Progress Notes (Signed)
Office Visit Note   Patient: Edward Todd           Date of Birth: 02-26-1976           MRN: [id number] Visit Date: 10/10/2018              Requested by: Truitt Merle, MD [address] [place],  [place] [address] PCP: London Pepper, MD   Assessment & Plan: Visit Diagnoses:  1. Multiple myeloma not having achieved remission ([place])   2. Compression fracture of thoracic vertebra, initial encounter, unspecified thoracic vertebral level (HCC)     Plan: Patient has multiple myeloma is getting chemotherapy.  I would not recommend kyphoplasty or vertebroplasty.  With chemo treatment he should get healing of the fractures with replacement of new bone.  Currently does not need a brace.  He does not have any cord compression no neurologic deficit but obviously is weak after chemotherapy.  I plan to recheck him in 2 months.  Follow-Up Instructions: Return in about 2 months (around 12/10/2018).   Orders:  No orders of the defined types were placed in this encounter.  No orders of the defined types were placed in this encounter.     Procedures: No procedures performed   Clinical Data: No additional findings.   Subjective: Chief Complaint  Patient presents with  . Middle Back - Pain  . Lower Back - Pain    HPI 43 year old male here with previous MRI scan thoracic spine lumbar spine with metastatic multiple myeloma who just started chemo.  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  He had to bring everything on the side of the truck foot, broken step he fell and states the oxygen tank fell on him.  This he states was on 06/28/2018.  States the back gait was broken.  Patient had scans that demonstrated pathologic compression fractures mild to moderate at T3 and T6-T10.  No cord compression.  Trace pleural effusion was noted.  Scan date was 09/01/2018.  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  Chronic appearing deformities at L2 and L4 but no acute or subacute bone marrow edema to suggest a recent injury.  There was some diffuse abnormal bone marrow signal but no definite changes in the lumbar region that would suggest marrow replacement process read on 08/30/2018.  Currently he is on oxycodone 10 for pain.  Review of Systems is system positive for multiple myeloma he has had first chemotherapy treatment.  History of acute kidney injury, type 2 diabetes without complications, hypertension, anemia of chronic disease.  Otherwise noncontributory as it pertains HPI.  Patient's oncologist is Dr. Burr Medico.   Objective: Vital Signs: Ht '6\' 4"'  (1.93 m)   Wt 275 lb (124.7 kg)   BMI 33.47 kg/m   Physical Exam Constitutional:      Appearance: He is well-developed.  HENT:     Head: Normocephalic and atraumatic.  Eyes:     Pupils: Pupils are equal, round, and reactive to light.  Neck:     Thyroid: No thyromegaly.     Trachea: No tracheal deviation.  Cardiovascular:     Rate and Rhythm: Normal rate.  Pulmonary:     Effort: Pulmonary effort is normal.     Breath sounds: No wheezing.  Abdominal:     General: Bowel sounds are normal.     Palpations: Abdomen is soft.  Skin:  General: Skin is warm and dry.     Capillary Refill: Capillary refill takes less than 2 seconds.  Neurological:     Mental Status: He is alert and oriented to person, place, and time.  Psychiatric:        Behavior: Behavior normal.        Thought Content: Thought content normal.        Judgment: Judgment normal.     Ortho Exam patient came down the hall in a wheelchair is fatigued.  Specialty Comments:  No specialty comments available.  Imaging: No results found.   PMFS History: Patient Active Problem List   Diagnosis Date Noted  . Thoracic compression fracture ([place]) 10/10/2018  . Labile blood pressure   . Labile blood glucose   . AKI (acute kidney  injury) ([place])   . Muscle spasm   . Diabetes mellitus type 2 in nonobese (HCC)   . Elevated blood pressure reading   . Hyponatremia   . Diabetes mellitus type 2 in obese ([place])   . Acute on chronic anemia   . Malaise   . FUO (fever of unknown origin)   . Sleep disturbance   . HTN (hypertension) 09/05/2018  . Type 2 diabetes mellitus without complication ([place]) 82/42/9980  . Hypophosphatemia 09/05/2018  . Constipation 09/05/2018  . Anemia of chronic disease 09/05/2018  . Multiple myeloma ([place])   . Symptomatic anemia 08/30/2018  . Acute bilateral low back pain   . Weakness of both lower extremities    Past Medical History:  Diagnosis Date  . Diabetes mellitus without complication ([place])   . Hypertension     Family History  Problem Relation Age of Onset  . Diabetes Maternal Grandmother   . Diabetes Paternal Grandfather     No past surgical history on file. Social History   Occupational History  . Not on file  Tobacco Use  . Smoking status: Never Smoker  . Smokeless tobacco: Never Used  Substance and Sexual Activity  . Alcohol use: No    Alcohol/week: 0.0 standard drinks  . Drug use: No  . Sexual activity: Yes

## 2018-10-11 ENCOUNTER — Other Ambulatory Visit: Payer: Self-pay

## 2018-10-11 ENCOUNTER — Inpatient Hospital Stay: Payer: 59

## 2018-10-11 ENCOUNTER — Telehealth: Payer: Self-pay

## 2018-10-11 VITALS — BP 114/75 | HR 69 | Temp 98.3°F | Resp 18

## 2018-10-11 DIAGNOSIS — Z5111 Encounter for antineoplastic chemotherapy: Secondary | ICD-10-CM | POA: Diagnosis not present

## 2018-10-11 DIAGNOSIS — C9000 Multiple myeloma not having achieved remission: Secondary | ICD-10-CM

## 2018-10-11 LAB — CBC WITH DIFFERENTIAL (CANCER CENTER ONLY)
Abs Immature Granulocytes: 0.02 10*3/uL (ref 0.00–0.07)
Basophils Absolute: 0 10*3/uL (ref 0.0–0.1)
Basophils Relative: 0 %
Eosinophils Absolute: 0 10*3/uL (ref 0.0–0.5)
Eosinophils Relative: 1 %
HCT: 26.8 % — ABNORMAL LOW (ref 39.0–52.0)
Hemoglobin: 8.7 g/dL — ABNORMAL LOW (ref 13.0–17.0)
Immature Granulocytes: 0 %
Lymphocytes Relative: 28 %
Lymphs Abs: 1.5 10*3/uL (ref 0.7–4.0)
MCH: 30.3 pg (ref 26.0–34.0)
MCHC: 32.5 g/dL (ref 30.0–36.0)
MCV: 93.4 fL (ref 80.0–100.0)
Monocytes Absolute: 0.1 10*3/uL (ref 0.1–1.0)
Monocytes Relative: 2 %
Neutro Abs: 3.7 10*3/uL (ref 1.7–7.7)
Neutrophils Relative %: 69 %
Platelet Count: 208 10*3/uL (ref 150–400)
RBC: 2.87 MIL/uL — ABNORMAL LOW (ref 4.22–5.81)
RDW: 16.1 % — ABNORMAL HIGH (ref 11.5–15.5)
WBC Count: 5.4 10*3/uL (ref 4.0–10.5)
nRBC: 0 % (ref 0.0–0.2)

## 2018-10-11 LAB — CMP (CANCER CENTER ONLY)
ALT: 14 U/L (ref 0–44)
AST: 11 U/L — ABNORMAL LOW (ref 15–41)
Albumin: 2.9 g/dL — ABNORMAL LOW (ref 3.5–5.0)
Alkaline Phosphatase: 77 U/L (ref 38–126)
Anion gap: 11 (ref 5–15)
BUN: 10 mg/dL (ref 6–20)
CO2: 21 mmol/L — ABNORMAL LOW (ref 22–32)
Calcium: 8.4 mg/dL — ABNORMAL LOW (ref 8.9–10.3)
Chloride: 103 mmol/L (ref 98–111)
Creatinine: 1.02 mg/dL (ref 0.61–1.24)
GFR, Est AFR Am: >60 mL/min (ref >60)
GFR, Estimated: >60 mL/min (ref >60)
Glucose, Bld: 214 mg/dL — ABNORMAL HIGH (ref 70–99)
Potassium: 3.9 mmol/L (ref 3.5–5.1)
Sodium: 135 mmol/L (ref 135–145)
Total Bilirubin: 0.3 mg/dL (ref 0.3–1.2)
Total Protein: 9.5 g/dL — ABNORMAL HIGH (ref 6.5–8.1)

## 2018-10-11 MED ORDER — DEXAMETHASONE 4 MG PO TABS: 40.0000 mg | ORAL_TABLET | Freq: Once | ORAL | Status: DC

## 2018-10-11 MED ORDER — PROCHLORPERAZINE MALEATE 10 MG PO TABS
10.0000 mg | ORAL_TABLET | Freq: Once | ORAL | Status: AC
  Administered 2018-10-11: 10 mg via ORAL

## 2018-10-11 MED ORDER — PROCHLORPERAZINE MALEATE 10 MG PO TABS
ORAL_TABLET | ORAL | Status: AC
  Filled 2018-10-11: qty 1

## 2018-10-11 MED ORDER — BORTEZOMIB CHEMO SQ INJECTION 3.5 MG (2.5MG/ML)
1.3000 mg/m2 | Freq: Once | INTRAMUSCULAR | Status: AC
  Administered 2018-10-11: 11:00:00 3.5 mg via SUBCUTANEOUS
  Filled 2018-10-11: qty 1.4

## 2018-10-11 NOTE — Patient Instructions (Signed)
[facility] Discharge Instructions for Patients Receiving Chemotherapy  Today you received the following chemotherapy agents: Velcade  To help prevent nausea and vomiting after your treatment, we encourage you to take your nausea medication as directed.    If you develop nausea and vomiting that is not controlled by your nausea medication, call the clinic.   BELOW ARE SYMPTOMS THAT SHOULD BE REPORTED IMMEDIATELY:  *FEVER GREATER THAN 100.5 F  *CHILLS WITH OR WITHOUT FEVER  NAUSEA AND VOMITING THAT IS NOT CONTROLLED WITH YOUR NAUSEA MEDICATION  *UNUSUAL SHORTNESS OF BREATH  *UNUSUAL BRUISING OR BLEEDING  TENDERNESS IN MOUTH AND THROAT WITH OR WITHOUT PRESENCE OF ULCERS  *URINARY PROBLEMS  *BOWEL PROBLEMS  UNUSUAL RASH Items with * indicate a potential emergency and should be followed up as soon as possible.  Feel free to call the clinic should you have any questions or concerns. The clinic phone number is [phone].  Please show the [facility] at check-in to the Emergency Department and triage nurse.     Bortezomib injection What is this medicine? BORTEZOMIB (bor TEZ oh mib) is a medicine that targets proteins in cancer cells and stops the cancer cells from growing. It is used to treat multiple myeloma and mantle-cell lymphoma. This medicine may be used for other purposes; ask your health care provider or pharmacist if you have questions. COMMON BRAND NAME(S): Velcade What should I tell my health care provider before I take this medicine? They need to know if you have any of these conditions: -diabetes -heart disease -irregular heartbeat -liver disease -on hemodialysis -low blood counts, like low white blood cells, platelets, or hemoglobin -peripheral neuropathy -taking medicine for blood pressure -an unusual or allergic reaction to bortezomib, mannitol, boron, other medicines, foods, dyes, or preservatives -pregnant or trying to get  pregnant -breast-feeding How should I use this medicine? This medicine is for injection into a vein or for injection under the skin. It is given by a health care professional in a hospital or clinic setting. Talk to your pediatrician regarding the use of this medicine in children. Special care may be needed. Overdosage: If you think you have taken too much of this medicine contact a poison control center or emergency room at once. NOTE: This medicine is only for you. Do not share this medicine with others. What if I miss a dose? It is important not to miss your dose. Call your doctor or health care professional if you are unable to keep an appointment. What may interact with this medicine? This medicine may interact with the following medications: -ketoconazole -rifampin -ritonavir -St. John's Wort This list may not describe all possible interactions. Give your health care provider a list of all the medicines, herbs, non-prescription drugs, or dietary supplements you use. Also tell them if you smoke, drink alcohol, or use illegal drugs. Some items may interact with your medicine. What should I watch for while using this medicine? You may get drowsy or dizzy. Do not drive, use machinery, or do anything that needs mental alertness until you know how this medicine affects you. Do not stand or sit up quickly, especially if you are an older patient. This reduces the risk of dizzy or fainting spells. In some cases, you may be given additional medicines to help with side effects. Follow all directions for their use. Call your doctor or health care professional for advice if you get a fever, chills or sore throat, or other symptoms of a cold or  flu. Do not treat yourself. This drug decreases your body's ability to fight infections. Try to avoid being around people who are sick. This medicine may increase your risk to bruise or bleed. Call your doctor or health care professional if you notice any unusual  bleeding. You may need blood work done while you are taking this medicine. In some patients, this medicine may cause a serious brain infection that may cause death. If you have any problems seeing, thinking, speaking, walking, or standing, tell your doctor right away. If you cannot reach your doctor, urgently seek other source of medical care. Check with your doctor or health care professional if you get an attack of severe diarrhea, nausea and vomiting, or if you sweat a lot. The loss of too much body fluid can make it dangerous for you to take this medicine. Do not become pregnant while taking this medicine or for at least 7 months after stopping it. Women should inform their doctor if they wish to become pregnant or think they might be pregnant. Men should not father a child while taking this medicine and for at least 4 months after stopping it. There is a potential for serious side effects to an unborn child. Talk to your health care professional or pharmacist for more information. Do not breast-feed an infant while taking this medicine or for 2 months after stopping it. This medicine may interfere with the ability to have a child. You should talk with your doctor or health care professional if you are concerned about your fertility. What side effects may I notice from receiving this medicine? Side effects that you should report to your doctor or health care professional as soon as possible: -allergic reactions like skin rash, itching or hives, swelling of the face, lips, or tongue -breathing problems -changes in hearing -changes in vision -fast, irregular heartbeat -feeling faint or lightheaded, falls -pain, tingling, numbness in the hands or feet -right upper belly pain -seizures -swelling of the ankles, feet, hands -unusual bleeding or bruising -unusually weak or tired -vomiting -yellowing of the eyes or skin Side effects that usually do not require medical attention (report to your  doctor or health care professional if they continue or are bothersome): -changes in emotions or moods -constipation -diarrhea -loss of appetite -headache -irritation at site where injected -nausea This list may not describe all possible side effects. Call your doctor for medical advice about side effects. You may report side effects to FDA at 1-800-FDA-1088. Where should I keep my medicine? This drug is given in a hospital or clinic and will not be stored at home. NOTE: This sheet is a summary. It may not cover all possible information. If you have questions about this medicine, talk to your doctor, pharmacist, or health care provider.  2019 Elsevier/Gold Standard (2017-08-21 16:29:31)   Zoledronic Acid injection (Hypercalcemia, Oncology) What is this medicine? ZOLEDRONIC ACID (ZOE le dron ik AS id) lowers the amount of calcium loss from bone. It is used to treat too much calcium in your blood from cancer. It is also used to prevent complications of cancer that has spread to the bone. This medicine may be used for other purposes; ask your health care provider or pharmacist if you have questions. COMMON BRAND NAME(S): Zometa What should I tell my health care provider before I take this medicine? They need to know if you have any of these conditions: -aspirin-sensitive asthma -cancer, especially if you are receiving medicines used to treat cancer -dental disease or  wear dentures -infection -kidney disease -receiving corticosteroids like dexamethasone or prednisone -an unusual or allergic reaction to zoledronic acid, other medicines, foods, dyes, or preservatives -pregnant or trying to get pregnant -breast-feeding How should I use this medicine? This medicine is for infusion into a vein. It is given by a health care professional in a hospital or clinic setting. Talk to your pediatrician regarding the use of this medicine in children. Special care may be needed. Overdosage: If you  think you have taken too much of this medicine contact a poison control center or emergency room at once. NOTE: This medicine is only for you. Do not share this medicine with others. What if I miss a dose? It is important not to miss your dose. Call your doctor or health care professional if you are unable to keep an appointment. What may interact with this medicine? -certain antibiotics given by injection -NSAIDs, medicines for pain and inflammation, like ibuprofen or naproxen -some diuretics like bumetanide, furosemide -teriparatide -thalidomide This list may not describe all possible interactions. Give your health care provider a list of all the medicines, herbs, non-prescription drugs, or dietary supplements you use. Also tell them if you smoke, drink alcohol, or use illegal drugs. Some items may interact with your medicine. What should I watch for while using this medicine? Visit your doctor or health care professional for regular checkups. It may be some time before you see the benefit from this medicine. Do not stop taking your medicine unless your doctor tells you to. Your doctor may order blood tests or other tests to see how you are doing. Women should inform their doctor if they wish to become pregnant or think they might be pregnant. There is a potential for serious side effects to an unborn child. Talk to your health care professional or pharmacist for more information. You should make sure that you get enough calcium and vitamin D while you are taking this medicine. Discuss the foods you eat and the vitamins you take with your health care professional. Some people who take this medicine have severe bone, joint, and/or muscle pain. This medicine may also increase your risk for jaw problems or a broken thigh bone. Tell your doctor right away if you have severe pain in your jaw, bones, joints, or muscles. Tell your doctor if you have any pain that does not go away or that gets worse. Tell  your dentist and dental surgeon that you are taking this medicine. You should not have major dental surgery while on this medicine. See your dentist to have a dental exam and fix any dental problems before starting this medicine. Take good care of your teeth while on this medicine. Make sure you see your dentist for regular follow-up appointments. What side effects may I notice from receiving this medicine? Side effects that you should report to your doctor or health care professional as soon as possible: -allergic reactions like skin rash, itching or hives, swelling of the face, lips, or tongue -anxiety, confusion, or depression -breathing problems -changes in vision -eye pain -feeling faint or lightheaded, falls -jaw pain, especially after dental work -mouth sores -muscle cramps, stiffness, or weakness -redness, blistering, peeling or loosening of the skin, including inside the mouth -trouble passing urine or change in the amount of urine Side effects that usually do not require medical attention (report to your doctor or health care professional if they continue or are bothersome): -bone, joint, or muscle pain -constipation -diarrhea -fever -hair loss -irritation at  site where injected -loss of appetite -nausea, vomiting -stomach upset -trouble sleeping -trouble swallowing -weak or tired This list may not describe all possible side effects. Call your doctor for medical advice about side effects. You may report side effects to FDA at 1-800-FDA-1088. Where should I keep my medicine? This drug is given in a hospital or clinic and will not be stored at home. NOTE: This sheet is a summary. It may not cover all possible information. If you have questions about this medicine, talk to your doctor, pharmacist, or health care provider.  2019 Elsevier/Gold Standard (2013-09-07 14:19:39)

## 2018-10-11 NOTE — Telephone Encounter (Signed)
Left voice message for patient that PET scan was denied by his insurance company, will continue current regimen.  Encouraged him to call back if he has questions.

## 2018-10-12 NOTE — Progress Notes (Addendum)
[facility]   [phone] [phone]   Clinic Follow up Note   Patient Care Team: London Pepper, MD as PCP - General (Family Medicine)  Date of Service:  10/19/2018  CHIEF COMPLAINT: F/u of MM  SUMMARY OF ONCOLOGIC HISTORY: Oncology History Overview Note  Cancer Staging Multiple myeloma ([place]) Staging form: Plasma Cell Myeloma and Plasma Cell Disorders, AJCC 8th Edition - Clinical stage from 09/04/2018: RISS Stage III (Beta-2-microglobulin (mg/L): 5.5, Albumin (g/dL): 2.6, ISS: Stage III, High-risk cytogenetics: Absent, LDH: Elevated) - Signed by Truitt Merle, MD on 09/26/2018     Multiple myeloma ([place])  08/31/2018 Initial Diagnosis   Multiple myeloma ([place])   09/04/2018 Cancer Staging   Staging form: Plasma Cell Myeloma and Plasma Cell Disorders, AJCC 8th Edition - Clinical stage from 09/04/2018: RISS Stage III (Beta-2-microglobulin (mg/L): 5.5, Albumin (g/dL): 2.6, ISS: Stage III, High-risk cytogenetics: Absent, LDH: Elevated) - Signed by Truitt Merle, MD on 09/26/2018   09/04/2018 Initial Biopsy   Diagnosis 09/04/18 Bone Marrow, Aspirate,Biopsy, and Clot, left posterior iliac crest BONE MARROW: - CELLULAR MARROW WITH INVOLVEMENT BY PLASMA CELL NEOPLASM (>95%) - SEE COMMENT PERIPHERAL BLOOD: - NORMOCYTIC ANEMIA - ROULEAUX FORMATION - SEE COMPLETE BLOOD COUNT   09/05/2018 -  Chemotherapy   Velcade injection and dexamethasone 15m weekly starting 09/05/18   09/14/2018 -  Chemotherapy   Revlimid 298m2 weeks on/1 week off starting 09/14/18    10/19/2018 -  Chemotherapy   The patient had bortezomib SQ (VELCADE) chemo injection 3 mg, 1.3 mg/m2 = 3 mg, Subcutaneous,  Once, 1 of 4 cycles Administration: 3 mg (10/19/2018)  for chemotherapy treatment.       CURRENT THERAPY:  1. Weekly velcade injection and dexamethasone 4055mtarting 09/05/18, Revlimid 49m58mily 2 weeks on and 1 week off starting 09/14/18.   -increase Velcade injection to twice a week  (Fridays/Mondays) for 2 weeks on, 1 week off starting 10/19/18 2. Monthly Zometa started on 09/05/2018  INTERVAL HISTORY:  Edward Navarezhere for a follow up and treatment. He presents to the clinic alone. He notes he is starting to move his legs more. He is gaining equal strength of both legs. He is now able to sleep better. His walking is improving slowly, he can stand for 2 minutes. He continues PT who comes twice a week and exercises daily. He tries to wait on taking pain meds. He has been taking oxycodone 3 times a day.  He notes he will get bleeding occasionally from constipation, but he is now having BM twice a day. He notes he is eating adequately now. He notes his BG was 180 at 7am, now 191.   REVIEW OF SYSTEMS:   Constitutional: Denies fevers, chills or abnormal weight loss Eyes: Denies blurriness of vision Ears, nose, mouth, throat, and face: Denies mucositis or sore throat Respiratory: Denies cough, dyspnea or wheezes Cardiovascular: Denies palpitation, chest discomfort or lower extremity swelling Gastrointestinal:  Denies nausea, heartburn or change in bowel habits Skin: Denies abnormal skin rashes Lymphatics: Denies new lymphadenopathy or easy bruising MSK: (+) Muscle Spasm Neurological:Denies numbness, tingling or new weaknesses (+) peripheral strength and ambulation is improving Behavioral/Psych: Mood is stable, no new changes  All other systems were reviewed with the patient and are negative.  MEDICAL HISTORY:  Past Medical History:  Diagnosis Date  . Diabetes mellitus without complication (HCC)[place]. Hypertension     SURGICAL HISTORY: History reviewed. No pertinent surgical history.  I have reviewed the  social history and family history with the patient and they are unchanged from previous note.  ALLERGIES:  is allergic to shellfish allergy.  MEDICATIONS:  Current Outpatient Medications  Medication Sig Dispense Refill  . acetaminophen (TYLENOL) 325 MG  tablet Take 2 tablets (650 mg total) by mouth every 6 (six) hours as needed for mild pain or fever.    Marland Kitchen acyclovir (ZOVIRAX) 400 MG tablet Take 1 tablet (400 mg total) by mouth 2 (two) times daily. 60 tablet 0  . aspirin 81 MG chewable tablet Chew 1 tablet (81 mg total) by mouth daily.    . calcium-vitamin D (OSCAL WITH D) 500-200 MG-UNIT tablet Take 1 tablet by mouth 2 (two) times daily. 60 tablet 1  . dexamethasone (DECADRON) 4 MG tablet Take 10 tablets (40 mg total) by mouth once a week. 40 tablet 2  . lenalidomide (REVLIMID) 25 MG capsule Take 1 capsule (25 mg total) by mouth daily. Take for 14 days on, 7 days off, repeat every 21 days.  Adult  Male.    Authorization   #   [id number]    10/18/2018. Faxed to [facility]    [phone]           Phone      [phone]. 14 capsule 0  . metFORMIN (GLUCOPHAGE) 1000 MG tablet Take 1 tablet (1,000 mg total) by mouth 2 (two) times daily with a meal. 60 tablet 0  . methocarbamol (ROBAXIN) 750 MG tablet Take 1 tablet (750 mg total) by mouth 3 (three) times daily. 90 tablet 2  . ondansetron (ZOFRAN-ODT) 4 MG disintegrating tablet Take 1 tablet (4 mg total) by mouth every 6 (six) hours as needed for nausea or vomiting. 20 tablet 0  . Oxycodone HCl 10 MG TABS Take 1 tablet (10 mg total) by mouth every 6 (six) hours as needed. 90 tablet 0  . pantoprazole (PROTONIX) 40 MG tablet Take 1 tablet (40 mg total) by mouth daily at 6 (six) AM. 30 tablet 0  . polyethylene glycol (MIRALAX / GLYCOLAX) 17 g packet Take 17 g by mouth daily. 14 each 0  . senna-docusate (SENOKOT-S) 8.6-50 MG tablet Take 1 tablet by mouth 2 (two) times daily.     No current facility-administered medications for this visit.     PHYSICAL EXAMINATION: ECOG PERFORMANCE STATUS: 3 - Symptomatic, >50% confined to bed  Vitals:   10/19/18 0951  BP: 115/71  Pulse: 86  Resp: 18  Temp: 98.5 F (36.9 C)  SpO2: 100%   Filed Weights   10/19/18 0951  Weight: 227 lb 14.4 oz (103.4 kg)     GENERAL:alert, no distress and comfortable SKIN: skin color, texture, turgor are normal, no rashes or significant lesions EYES: normal, Conjunctiva are pink and non-injected, sclera clear  NECK: supple, thyroid normal size, non-tender, without nodularity LYMPH:  no palpable lymphadenopathy in the cervical, axillary  LUNGS: clear to auscultation and percussion with normal breathing effort HEART: regular rate & rhythm and no murmurs and no lower extremity edema ABDOMEN:abdomen soft, non-tender and normal bowel sounds Musculoskeletal:no cyanosis of digits and no clubbing  NEURO: alert & oriented x 3 with fluent speech, no focal motor/sensory deficits  LABORATORY DATA:  I have reviewed the data as listed CBC Latest Ref Rng & Units 10/19/2018 10/11/2018 10/04/2018  WBC 4.0 - 10.5 K/uL 6.0 5.4 4.5  Hemoglobin 13.0 - 17.0 g/dL 9.3(L) 8.7(L) 8.6(L)  Hematocrit 39.0 - 52.0 % 27.8(L) 26.8(L) 25.3(L)  Platelets 150 - 400 K/uL 238 208 199  CMP Latest Ref Rng & Units 10/19/2018 10/11/2018 10/04/2018  Glucose 70 - 99 mg/dL 191(H) 214(H) 211(H)  BUN 6 - 20 mg/dL _0 Creatinine 0.61 - 1.24 mg/dL 1.00 1.02 0.98  Sodium 135 - 145 mmol/L 137 135 134(L)  Potassium 3.5 - 5.1 mmol/L 3.7 3.9 3.9  Chloride 98 - 111 mmol/L 106 103 104  CO2 22 - 32 mmol/L 21(L) 21(L) 20(L)  Calcium 8.9 - 10.3 mg/dL 8.0(L) 8.4(L) 8.6(L)  Total Protein 6.5 - 8.1 g/dL 9.1(H) 9.5(H) 9.2(H)  Total Bilirubin 0.3 - 1.2 mg/dL 0.3 0.3 0.3  Alkaline Phos 38 - 126 U/L 84 77 82  AST 15 - 41 U/L 11(L) 11(L) 10(L)  ALT 0 - 44 U/L _1 M-protein (g/dl) 08/30/2018: 5.7 09/27/2018: 4.0    RADIOGRAPHIC STUDIES: I have personally reviewed the radiological images as listed and agreed with the findings in the report. No results found.   ASSESSMENT & PLAN:  Edward Todd is a 43 y.o. male with    1. Multiple Myeloma, IgG Kappa, stage III, trisomy 11, standard risk -He was hospitalized initially for anemia and severe  back pain. Work up confirmed multiple myeloma, unfortunately he has multiple compression fracture of thoracic and lumbar spine. -I started him on induction chemo with RVD: weekly Velcade injections with Dexamethasone started on 09/05/18 and oral Revlimid on 09/14/18. -His cytogenetics and FISH results showed trisomy 11, FISH panel otherwise negative, this is considered a standard risk.  -He is a candidate for stem cell transplant, I will refer him to [facility] for transplant consultation. Given his standard risk disease, he may respond well to induction chemotherapy, bone marrow transplant could be deferred if he has good response to induction chemo.  -I previously recommend PET scan for base line, but it was denied by his insurance  -He continues to recover post hospitalization. He is eating adequately, exercising with improvement of strength and mobility.  -Labs reviewed, CBC and CMP WNL except Hg 9.3, BG 191, Ca 8, protein 9.1, albumin 3.1, AST 11. Will proceed with Velcade injection today.  -M-protein continues to trend down, but slowly. I discussed increase Velcade to day 1, 4, 8, and 15 every 21 dayst. Will monitor for neuropathy with increase. He is agreeable. Will start first cycle today.  -He will continue oral Dexa 102m on Fridays weekly and Revlimid.  -f/u every 3 weeks    2. Anemia, secondary to MM  -He required blood transfusion 09/12/18.  -On Oral iron  -Labs reviewed, Hg at 9.3 today (10/19/18) -Continue to monitor.   3. Severe low back pain  -secondary to thoracic and lumbar compression fractures, lumbar spine stenosisseen on 08/31/18 bone survey.  -He has completed rehab and currently doing PT  -He is on Robaxin 7512mand oxycodone 1023m-4 times a day as needed which is mostly controlling his pain. He plans to get tylenol for breakthrough pain.  -I discussed to reduce longterm use of narcotics I recommend he use half tablet if pain is improving. -His peripheral  strength and ambulation is improving. He will continue PT  -He will continue stool softeners as needed to prevent constipation from pain meds.   4. DM, Type II -On Metformin BID -I again encouraged him to reduce carbohydrates, juice, soft drinks in diet.  -Will monitor on dexamethasone.  -BG at 191 today (10/19/18). I will refer him to endocrinologist to be better managed. He is agreeable   5. Financial support -I  will fill out work form for him today  -I offered him our [facility] resources that are available to him.  -He does having insurance and he does plan to apply for disability.    PLAN:  -Refer to endocrinologist  -Refer to BMT specialist at [facility]  -I will refill Oxycodone and Robaxin today  -Labs reviewed, will proceed with Velcade injection today, and change it to day 1, 4, 8, 15 every 21 days  -lab weekly on Fridays before injection  -continue weekly dexa 28m and Revlimid  -f/u in 3 weeks   No problem-specific Assessment & Plan notes found for this encounter.   No orders of the defined types were placed in this encounter.  All questions were answered. The patient knows to call the clinic with any problems, questions or concerns. No barriers to learning was detected. I spent 25 minutes counseling the patient face to face. The total time spent in the appointment was 30 minutes and more than 50% was on counseling and review of test results     YTruitt Merle MD 10/19/2018   I, AJoslyn Devon am acting as scribe for YTruitt Merle MD.   I have reviewed the above documentation for accuracy and completeness, and I agree with the above.

## 2018-10-18 ENCOUNTER — Other Ambulatory Visit: Payer: Self-pay | Admitting: *Deleted

## 2018-10-18 DIAGNOSIS — C9000 Multiple myeloma not having achieved remission: Secondary | ICD-10-CM

## 2018-10-18 MED ORDER — LENALIDOMIDE 25 MG PO CAPS: 25.0000 mg | ORAL_CAPSULE | Freq: Every day | ORAL | 0 refills | Status: DC

## 2018-10-19 ENCOUNTER — Inpatient Hospital Stay: Payer: 59

## 2018-10-19 ENCOUNTER — Inpatient Hospital Stay (HOSPITAL_BASED_OUTPATIENT_CLINIC_OR_DEPARTMENT_OTHER): Payer: 59 | Admitting: Hematology

## 2018-10-19 ENCOUNTER — Telehealth: Payer: Self-pay | Admitting: Hematology

## 2018-10-19 ENCOUNTER — Other Ambulatory Visit: Payer: Self-pay

## 2018-10-19 ENCOUNTER — Encounter: Payer: Self-pay | Admitting: Hematology

## 2018-10-19 VITALS — BP 115/71 | HR 86 | Temp 98.5°F | Resp 18 | Ht 76.0 in | Wt 227.9 lb

## 2018-10-19 DIAGNOSIS — D630 Anemia in neoplastic disease: Secondary | ICD-10-CM | POA: Diagnosis not present

## 2018-10-19 DIAGNOSIS — Z7984 Long term (current) use of oral hypoglycemic drugs: Secondary | ICD-10-CM

## 2018-10-19 DIAGNOSIS — Z5111 Encounter for antineoplastic chemotherapy: Secondary | ICD-10-CM

## 2018-10-19 DIAGNOSIS — Z9221 Personal history of antineoplastic chemotherapy: Secondary | ICD-10-CM

## 2018-10-19 DIAGNOSIS — M545 Low back pain: Secondary | ICD-10-CM

## 2018-10-19 DIAGNOSIS — E119 Type 2 diabetes mellitus without complications: Secondary | ICD-10-CM

## 2018-10-19 DIAGNOSIS — I10 Essential (primary) hypertension: Secondary | ICD-10-CM

## 2018-10-19 DIAGNOSIS — C9000 Multiple myeloma not having achieved remission: Secondary | ICD-10-CM

## 2018-10-19 DIAGNOSIS — Z7982 Long term (current) use of aspirin: Secondary | ICD-10-CM

## 2018-10-19 DIAGNOSIS — Z79899 Other long term (current) drug therapy: Secondary | ICD-10-CM

## 2018-10-19 DIAGNOSIS — K5900 Constipation, unspecified: Secondary | ICD-10-CM | POA: Diagnosis not present

## 2018-10-19 LAB — CMP (CANCER CENTER ONLY)
ALT: 16 U/L (ref 0–44)
AST: 11 U/L — ABNORMAL LOW (ref 15–41)
Albumin: 3.1 g/dL — ABNORMAL LOW (ref 3.5–5.0)
Alkaline Phosphatase: 84 U/L (ref 38–126)
Anion gap: 10 (ref 5–15)
BUN: 8 mg/dL (ref 6–20)
CO2: 21 mmol/L — ABNORMAL LOW (ref 22–32)
Calcium: 8 mg/dL — ABNORMAL LOW (ref 8.9–10.3)
Chloride: 106 mmol/L (ref 98–111)
Creatinine: 1 mg/dL (ref 0.61–1.24)
GFR, Est AFR Am: >60 mL/min (ref >60)
GFR, Estimated: >60 mL/min (ref >60)
Glucose, Bld: 191 mg/dL — ABNORMAL HIGH (ref 70–99)
Potassium: 3.7 mmol/L (ref 3.5–5.1)
Sodium: 137 mmol/L (ref 135–145)
Total Bilirubin: 0.3 mg/dL (ref 0.3–1.2)
Total Protein: 9.1 g/dL — ABNORMAL HIGH (ref 6.5–8.1)

## 2018-10-19 LAB — CBC WITH DIFFERENTIAL (CANCER CENTER ONLY)
Abs Immature Granulocytes: 0.01 10*3/uL (ref 0.00–0.07)
Basophils Absolute: 0 10*3/uL (ref 0.0–0.1)
Basophils Relative: 1 %
Eosinophils Absolute: 0.1 10*3/uL (ref 0.0–0.5)
Eosinophils Relative: 2 %
HCT: 27.8 % — ABNORMAL LOW (ref 39.0–52.0)
Hemoglobin: 9.3 g/dL — ABNORMAL LOW (ref 13.0–17.0)
Immature Granulocytes: 0 %
Lymphocytes Relative: 34 %
Lymphs Abs: 2 10*3/uL (ref 0.7–4.0)
MCH: 30.5 pg (ref 26.0–34.0)
MCHC: 33.5 g/dL (ref 30.0–36.0)
MCV: 91.1 fL (ref 80.0–100.0)
Monocytes Absolute: 0.3 10*3/uL (ref 0.1–1.0)
Monocytes Relative: 6 %
Neutro Abs: 3.5 10*3/uL (ref 1.7–7.7)
Neutrophils Relative %: 57 %
Platelet Count: 238 10*3/uL (ref 150–400)
RBC: 3.05 MIL/uL — ABNORMAL LOW (ref 4.22–5.81)
RDW: 16.7 % — ABNORMAL HIGH (ref 11.5–15.5)
WBC Count: 6 10*3/uL (ref 4.0–10.5)
nRBC: 0 % (ref 0.0–0.2)

## 2018-10-19 MED ORDER — OXYCODONE HCL 10 MG PO TABS: 10.0000 mg | ORAL_TABLET | Freq: Four times a day (QID) | ORAL | 0 refills | Status: DC | PRN

## 2018-10-19 MED ORDER — PROCHLORPERAZINE MALEATE 10 MG PO TABS
ORAL_TABLET | ORAL | Status: AC
  Filled 2018-10-19: qty 1

## 2018-10-19 MED ORDER — BORTEZOMIB CHEMO SQ INJECTION 3.5 MG (2.5MG/ML)
1.3000 mg/m2 | Freq: Once | INTRAMUSCULAR | Status: AC
  Administered 2018-10-19: 12:00:00 3 mg via SUBCUTANEOUS
  Filled 2018-10-19: qty 1.2

## 2018-10-19 MED ORDER — DEXAMETHASONE 4 MG PO TABS
ORAL_TABLET | ORAL | Status: AC
  Filled 2018-10-19: qty 10

## 2018-10-19 MED ORDER — DEXAMETHASONE 4 MG PO TABS: 40.0000 mg | ORAL_TABLET | Freq: Once | ORAL | Status: DC

## 2018-10-19 MED ORDER — PROCHLORPERAZINE MALEATE 10 MG PO TABS
10.0000 mg | ORAL_TABLET | Freq: Once | ORAL | Status: AC
  Administered 2018-10-19: 10 mg via ORAL

## 2018-10-19 MED ORDER — METHOCARBAMOL 750 MG PO TABS: 750.0000 mg | ORAL_TABLET | Freq: Three times a day (TID) | ORAL | 2 refills | Status: DC

## 2018-10-19 NOTE — Telephone Encounter (Signed)
Scheduled appt per 6/26 los. Printed calendar.

## 2018-10-22 ENCOUNTER — Inpatient Hospital Stay: Payer: 59

## 2018-10-25 ENCOUNTER — Inpatient Hospital Stay: Payer: 59

## 2018-10-25 ENCOUNTER — Other Ambulatory Visit: Payer: Self-pay

## 2018-10-25 ENCOUNTER — Telehealth: Payer: Self-pay

## 2018-10-25 ENCOUNTER — Inpatient Hospital Stay: Payer: 59 | Attending: Hematology

## 2018-10-25 VITALS — BP 122/84 | HR 85 | Temp 98.2°F | Resp 20

## 2018-10-25 DIAGNOSIS — I10 Essential (primary) hypertension: Secondary | ICD-10-CM | POA: Diagnosis not present

## 2018-10-25 DIAGNOSIS — Z79899 Other long term (current) drug therapy: Secondary | ICD-10-CM | POA: Diagnosis not present

## 2018-10-25 DIAGNOSIS — Z7984 Long term (current) use of oral hypoglycemic drugs: Secondary | ICD-10-CM | POA: Diagnosis not present

## 2018-10-25 DIAGNOSIS — D630 Anemia in neoplastic disease: Secondary | ICD-10-CM | POA: Diagnosis not present

## 2018-10-25 DIAGNOSIS — G4700 Insomnia, unspecified: Secondary | ICD-10-CM | POA: Insufficient documentation

## 2018-10-25 DIAGNOSIS — E119 Type 2 diabetes mellitus without complications: Secondary | ICD-10-CM | POA: Diagnosis not present

## 2018-10-25 DIAGNOSIS — M48061 Spinal stenosis, lumbar region without neurogenic claudication: Secondary | ICD-10-CM | POA: Insufficient documentation

## 2018-10-25 DIAGNOSIS — C9000 Multiple myeloma not having achieved remission: Secondary | ICD-10-CM | POA: Insufficient documentation

## 2018-10-25 DIAGNOSIS — Z5111 Encounter for antineoplastic chemotherapy: Secondary | ICD-10-CM | POA: Insufficient documentation

## 2018-10-25 DIAGNOSIS — Z9221 Personal history of antineoplastic chemotherapy: Secondary | ICD-10-CM | POA: Insufficient documentation

## 2018-10-25 DIAGNOSIS — Z7982 Long term (current) use of aspirin: Secondary | ICD-10-CM | POA: Insufficient documentation

## 2018-10-25 LAB — CMP (CANCER CENTER ONLY)
ALT: 11 U/L (ref 0–44)
AST: 9 U/L — ABNORMAL LOW (ref 15–41)
Albumin: 3.1 g/dL — ABNORMAL LOW (ref 3.5–5.0)
Alkaline Phosphatase: 82 U/L (ref 38–126)
Anion gap: 11 (ref 5–15)
BUN: 8 mg/dL (ref 6–20)
CO2: 19 mmol/L — ABNORMAL LOW (ref 22–32)
Calcium: 7.9 mg/dL — ABNORMAL LOW (ref 8.9–10.3)
Chloride: 107 mmol/L (ref 98–111)
Creatinine: 0.81 mg/dL (ref 0.61–1.24)
GFR, Est AFR Am: >60 mL/min (ref >60)
GFR, Estimated: >60 mL/min (ref >60)
Glucose, Bld: 162 mg/dL — ABNORMAL HIGH (ref 70–99)
Potassium: 3.9 mmol/L (ref 3.5–5.1)
Sodium: 137 mmol/L (ref 135–145)
Total Bilirubin: 0.3 mg/dL (ref 0.3–1.2)
Total Protein: 8.2 g/dL — ABNORMAL HIGH (ref 6.5–8.1)

## 2018-10-25 LAB — CBC WITH DIFFERENTIAL (CANCER CENTER ONLY)
Abs Immature Granulocytes: 0.01 K/uL (ref 0.00–0.07)
Basophils Absolute: 0 K/uL (ref 0.0–0.1)
Basophils Relative: 0 %
Eosinophils Absolute: 0.1 K/uL (ref 0.0–0.5)
Eosinophils Relative: 1 %
HCT: 27.5 % — ABNORMAL LOW (ref 39.0–52.0)
Hemoglobin: 9.2 g/dL — ABNORMAL LOW (ref 13.0–17.0)
Immature Granulocytes: 0 %
Lymphocytes Relative: 53 %
Lymphs Abs: 2.9 K/uL (ref 0.7–4.0)
MCH: 30.7 pg (ref 26.0–34.0)
MCHC: 33.5 g/dL (ref 30.0–36.0)
MCV: 91.7 fL (ref 80.0–100.0)
Monocytes Absolute: 0.3 K/uL (ref 0.1–1.0)
Monocytes Relative: 6 %
Neutro Abs: 2.2 K/uL (ref 1.7–7.7)
Neutrophils Relative %: 40 %
Platelet Count: 202 K/uL (ref 150–400)
RBC: 3 MIL/uL — ABNORMAL LOW (ref 4.22–5.81)
RDW: 16.5 % — ABNORMAL HIGH (ref 11.5–15.5)
WBC Count: 5.5 K/uL (ref 4.0–10.5)
nRBC: 0 % (ref 0.0–0.2)

## 2018-10-25 MED ORDER — BORTEZOMIB CHEMO SQ INJECTION 3.5 MG (2.5MG/ML)
1.3000 mg/m2 | Freq: Once | INTRAMUSCULAR | Status: AC
  Administered 2018-10-25: 10:00:00 3 mg via SUBCUTANEOUS
  Filled 2018-10-25: qty 1.2

## 2018-10-25 MED ORDER — PANTOPRAZOLE SODIUM 40 MG PO TBEC: 40.0000 mg | DELAYED_RELEASE_TABLET | Freq: Every day | ORAL | 0 refills | Status: DC

## 2018-10-25 MED ORDER — METFORMIN HCL 1000 MG PO TABS: 1000.0000 mg | ORAL_TABLET | Freq: Two times a day (BID) | ORAL | 0 refills | Status: DC

## 2018-10-25 MED ORDER — ACYCLOVIR 400 MG PO TABS: 400.0000 mg | ORAL_TABLET | Freq: Two times a day (BID) | ORAL | 0 refills | Status: DC

## 2018-10-25 MED ORDER — PROCHLORPERAZINE MALEATE 10 MG PO TABS
ORAL_TABLET | ORAL | Status: AC
  Filled 2018-10-25: qty 1

## 2018-10-25 MED ORDER — PROCHLORPERAZINE MALEATE 10 MG PO TABS
10.0000 mg | ORAL_TABLET | Freq: Once | ORAL | Status: AC
  Administered 2018-10-25: 10 mg via ORAL

## 2018-10-25 NOTE — Patient Instructions (Signed)
[facility] Discharge Instructions for Patients Receiving Chemotherapy  Today you received the following chemotherapy agents: Velcade  To help prevent nausea and vomiting after your treatment, we encourage you to take your nausea medication as directed.    If you develop nausea and vomiting that is not controlled by your nausea medication, call the clinic.   BELOW ARE SYMPTOMS THAT SHOULD BE REPORTED IMMEDIATELY:  *FEVER GREATER THAN 100.5 F  *CHILLS WITH OR WITHOUT FEVER  NAUSEA AND VOMITING THAT IS NOT CONTROLLED WITH YOUR NAUSEA MEDICATION  *UNUSUAL SHORTNESS OF BREATH  *UNUSUAL BRUISING OR BLEEDING  TENDERNESS IN MOUTH AND THROAT WITH OR WITHOUT PRESENCE OF ULCERS  *URINARY PROBLEMS  *BOWEL PROBLEMS  UNUSUAL RASH Items with * indicate a potential emergency and should be followed up as soon as possible.  Feel free to call the clinic should you have any questions or concerns. The clinic phone number is [phone].  Please show the [facility] at check-in to the Emergency Department and triage nurse.     Bortezomib injection What is this medicine? BORTEZOMIB (bor TEZ oh mib) is a medicine that targets proteins in cancer cells and stops the cancer cells from growing. It is used to treat multiple myeloma and mantle-cell lymphoma. This medicine may be used for other purposes; ask your health care provider or pharmacist if you have questions. COMMON BRAND NAME(S): Velcade What should I tell my health care provider before I take this medicine? They need to know if you have any of these conditions: -diabetes -heart disease -irregular heartbeat -liver disease -on hemodialysis -low blood counts, like low white blood cells, platelets, or hemoglobin -peripheral neuropathy -taking medicine for blood pressure -an unusual or allergic reaction to bortezomib, mannitol, boron, other medicines, foods, dyes, or preservatives -pregnant or trying to get  pregnant -breast-feeding How should I use this medicine? This medicine is for injection into a vein or for injection under the skin. It is given by a health care professional in a hospital or clinic setting. Talk to your pediatrician regarding the use of this medicine in children. Special care may be needed. Overdosage: If you think you have taken too much of this medicine contact a poison control center or emergency room at once. NOTE: This medicine is only for you. Do not share this medicine with others. What if I miss a dose? It is important not to miss your dose. Call your doctor or health care professional if you are unable to keep an appointment. What may interact with this medicine? This medicine may interact with the following medications: -ketoconazole -rifampin -ritonavir -St. John's Wort This list may not describe all possible interactions. Give your health care provider a list of all the medicines, herbs, non-prescription drugs, or dietary supplements you use. Also tell them if you smoke, drink alcohol, or use illegal drugs. Some items may interact with your medicine. What should I watch for while using this medicine? You may get drowsy or dizzy. Do not drive, use machinery, or do anything that needs mental alertness until you know how this medicine affects you. Do not stand or sit up quickly, especially if you are an older patient. This reduces the risk of dizzy or fainting spells. In some cases, you may be given additional medicines to help with side effects. Follow all directions for their use. Call your doctor or health care professional for advice if you get a fever, chills or sore throat, or other symptoms of a cold or  flu. Do not treat yourself. This drug decreases your body's ability to fight infections. Try to avoid being around people who are sick. This medicine may increase your risk to bruise or bleed. Call your doctor or health care professional if you notice any unusual  bleeding. You may need blood work done while you are taking this medicine. In some patients, this medicine may cause a serious brain infection that may cause death. If you have any problems seeing, thinking, speaking, walking, or standing, tell your doctor right away. If you cannot reach your doctor, urgently seek other source of medical care. Check with your doctor or health care professional if you get an attack of severe diarrhea, nausea and vomiting, or if you sweat a lot. The loss of too much body fluid can make it dangerous for you to take this medicine. Do not become pregnant while taking this medicine or for at least 7 months after stopping it. Women should inform their doctor if they wish to become pregnant or think they might be pregnant. Men should not father a child while taking this medicine and for at least 4 months after stopping it. There is a potential for serious side effects to an unborn child. Talk to your health care professional or pharmacist for more information. Do not breast-feed an infant while taking this medicine or for 2 months after stopping it. This medicine may interfere with the ability to have a child. You should talk with your doctor or health care professional if you are concerned about your fertility. What side effects may I notice from receiving this medicine? Side effects that you should report to your doctor or health care professional as soon as possible: -allergic reactions like skin rash, itching or hives, swelling of the face, lips, or tongue -breathing problems -changes in hearing -changes in vision -fast, irregular heartbeat -feeling faint or lightheaded, falls -pain, tingling, numbness in the hands or feet -right upper belly pain -seizures -swelling of the ankles, feet, hands -unusual bleeding or bruising -unusually weak or tired -vomiting -yellowing of the eyes or skin Side effects that usually do not require medical attention (report to your  doctor or health care professional if they continue or are bothersome): -changes in emotions or moods -constipation -diarrhea -loss of appetite -headache -irritation at site where injected -nausea This list may not describe all possible side effects. Call your doctor for medical advice about side effects. You may report side effects to FDA at 1-800-FDA-1088. Where should I keep my medicine? This drug is given in a hospital or clinic and will not be stored at home. NOTE: This sheet is a summary. It may not cover all possible information. If you have questions about this medicine, talk to your doctor, pharmacist, or health care provider.  2019 Elsevier/Gold Standard (2017-08-21 16:29:31)   Zoledronic Acid injection (Hypercalcemia, Oncology) What is this medicine? ZOLEDRONIC ACID (ZOE le dron ik AS id) lowers the amount of calcium loss from bone. It is used to treat too much calcium in your blood from cancer. It is also used to prevent complications of cancer that has spread to the bone. This medicine may be used for other purposes; ask your health care provider or pharmacist if you have questions. COMMON BRAND NAME(S): Zometa What should I tell my health care provider before I take this medicine? They need to know if you have any of these conditions: -aspirin-sensitive asthma -cancer, especially if you are receiving medicines used to treat cancer -dental disease or  wear dentures -infection -kidney disease -receiving corticosteroids like dexamethasone or prednisone -an unusual or allergic reaction to zoledronic acid, other medicines, foods, dyes, or preservatives -pregnant or trying to get pregnant -breast-feeding How should I use this medicine? This medicine is for infusion into a vein. It is given by a health care professional in a hospital or clinic setting. Talk to your pediatrician regarding the use of this medicine in children. Special care may be needed. Overdosage: If you  think you have taken too much of this medicine contact a poison control center or emergency room at once. NOTE: This medicine is only for you. Do not share this medicine with others. What if I miss a dose? It is important not to miss your dose. Call your doctor or health care professional if you are unable to keep an appointment. What may interact with this medicine? -certain antibiotics given by injection -NSAIDs, medicines for pain and inflammation, like ibuprofen or naproxen -some diuretics like bumetanide, furosemide -teriparatide -thalidomide This list may not describe all possible interactions. Give your health care provider a list of all the medicines, herbs, non-prescription drugs, or dietary supplements you use. Also tell them if you smoke, drink alcohol, or use illegal drugs. Some items may interact with your medicine. What should I watch for while using this medicine? Visit your doctor or health care professional for regular checkups. It may be some time before you see the benefit from this medicine. Do not stop taking your medicine unless your doctor tells you to. Your doctor may order blood tests or other tests to see how you are doing. Women should inform their doctor if they wish to become pregnant or think they might be pregnant. There is a potential for serious side effects to an unborn child. Talk to your health care professional or pharmacist for more information. You should make sure that you get enough calcium and vitamin D while you are taking this medicine. Discuss the foods you eat and the vitamins you take with your health care professional. Some people who take this medicine have severe bone, joint, and/or muscle pain. This medicine may also increase your risk for jaw problems or a broken thigh bone. Tell your doctor right away if you have severe pain in your jaw, bones, joints, or muscles. Tell your doctor if you have any pain that does not go away or that gets worse. Tell  your dentist and dental surgeon that you are taking this medicine. You should not have major dental surgery while on this medicine. See your dentist to have a dental exam and fix any dental problems before starting this medicine. Take good care of your teeth while on this medicine. Make sure you see your dentist for regular follow-up appointments. What side effects may I notice from receiving this medicine? Side effects that you should report to your doctor or health care professional as soon as possible: -allergic reactions like skin rash, itching or hives, swelling of the face, lips, or tongue -anxiety, confusion, or depression -breathing problems -changes in vision -eye pain -feeling faint or lightheaded, falls -jaw pain, especially after dental work -mouth sores -muscle cramps, stiffness, or weakness -redness, blistering, peeling or loosening of the skin, including inside the mouth -trouble passing urine or change in the amount of urine Side effects that usually do not require medical attention (report to your doctor or health care professional if they continue or are bothersome): -bone, joint, or muscle pain -constipation -diarrhea -fever -hair loss -irritation at  site where injected -loss of appetite -nausea, vomiting -stomach upset -trouble sleeping -trouble swallowing -weak or tired This list may not describe all possible side effects. Call your doctor for medical advice about side effects. You may report side effects to FDA at 1-800-FDA-1088. Where should I keep my medicine? This drug is given in a hospital or clinic and will not be stored at home. NOTE: This sheet is a summary. It may not cover all possible information. If you have questions about this medicine, talk to your doctor, pharmacist, or health care provider.  2019 Elsevier/Gold Standard (2013-09-07 14:19:39)

## 2018-10-25 NOTE — Telephone Encounter (Signed)
Received a call from patient's wife regarding needing refills on Pantoprazole, Acyclovir and Metformin.  They requested they be sent into [facility].  All were refilled to this pharmacy, called patient and left him a voice mail that it has been taken care of.

## 2018-10-26 LAB — KAPPA/LAMBDA LIGHT CHAINS
Kappa free light chain: 68 mg/L — ABNORMAL HIGH (ref 3.3–19.4)
Kappa, lambda light chain ratio: 16.59 — ABNORMAL HIGH (ref 0.26–1.65)
Lambda free light chains: 4.1 mg/L — ABNORMAL LOW (ref 5.7–26.3)

## 2018-10-29 ENCOUNTER — Telehealth: Payer: Self-pay

## 2018-10-29 ENCOUNTER — Inpatient Hospital Stay: Payer: 59

## 2018-10-29 LAB — MULTIPLE MYELOMA PANEL, SERUM
Albumin SerPl Elph-Mcnc: 3.3 g/dL (ref 2.9–4.4)
Albumin/Glob SerPl: 0.8 (ref 0.7–1.7)
Alpha 1: 0.2 g/dL (ref 0.0–0.4)
Alpha2 Glob SerPl Elph-Mcnc: 0.7 g/dL (ref 0.4–1.0)
B-Globulin SerPl Elph-Mcnc: 0.9 g/dL (ref 0.7–1.3)
Gamma Glob SerPl Elph-Mcnc: 2.6 g/dL — ABNORMAL HIGH (ref 0.4–1.8)
Globulin, Total: 4.4 g/dL — ABNORMAL HIGH (ref 2.2–3.9)
IgA: 19 mg/dL — ABNORMAL LOW (ref 90–386)
IgG (Immunoglobin G), Serum: 2502 mg/dL — ABNORMAL HIGH (ref 603–1613)
IgM (Immunoglobulin M), Srm: 28 mg/dL (ref 20–172)
M Protein SerPl Elph-Mcnc: 2.3 g/dL — ABNORMAL HIGH
Total Protein ELP: 7.7 g/dL (ref 6.0–8.5)

## 2018-10-29 NOTE — Telephone Encounter (Signed)
Calling to check on you and see how you're doing; we have you down for an appointment for the Velcade injection today.   Mr. Ransome replied he only wants to come once a week, is feeling "woozy" from last Thursday July 2nd, and will see [place] the 9th. Not interested in [facility] today. Will contact Valda Favia RN and Dr. Burr Medico, and cancel appointment for today.

## 2018-10-30 ENCOUNTER — Other Ambulatory Visit: Payer: Self-pay | Admitting: *Deleted

## 2018-10-31 ENCOUNTER — Telehealth: Payer: Self-pay | Admitting: Hematology

## 2018-10-31 NOTE — Telephone Encounter (Signed)
Faxed records to Dr. Debbrah Alar at [facility].

## 2018-11-01 ENCOUNTER — Inpatient Hospital Stay: Payer: 59

## 2018-11-01 ENCOUNTER — Other Ambulatory Visit: Payer: Self-pay

## 2018-11-01 VITALS — BP 127/90 | HR 82 | Temp 98.3°F | Resp 18

## 2018-11-01 DIAGNOSIS — Z5111 Encounter for antineoplastic chemotherapy: Secondary | ICD-10-CM | POA: Diagnosis not present

## 2018-11-01 DIAGNOSIS — C9000 Multiple myeloma not having achieved remission: Secondary | ICD-10-CM

## 2018-11-01 LAB — CBC WITH DIFFERENTIAL (CANCER CENTER ONLY)
Abs Immature Granulocytes: 0.01 10*3/uL (ref 0.00–0.07)
Basophils Absolute: 0 10*3/uL (ref 0.0–0.1)
Basophils Relative: 1 %
Eosinophils Absolute: 0.2 10*3/uL (ref 0.0–0.5)
Eosinophils Relative: 5 %
HCT: 28.4 % — ABNORMAL LOW (ref 39.0–52.0)
Hemoglobin: 9.4 g/dL — ABNORMAL LOW (ref 13.0–17.0)
Immature Granulocytes: 0 %
Lymphocytes Relative: 59 %
Lymphs Abs: 2.5 10*3/uL (ref 0.7–4.0)
MCH: 30.9 pg (ref 26.0–34.0)
MCHC: 33.1 g/dL (ref 30.0–36.0)
MCV: 93.4 fL (ref 80.0–100.0)
Monocytes Absolute: 0.2 10*3/uL (ref 0.1–1.0)
Monocytes Relative: 4 %
Neutro Abs: 1.3 10*3/uL — ABNORMAL LOW (ref 1.7–7.7)
Neutrophils Relative %: 31 %
Platelet Count: 167 10*3/uL (ref 150–400)
RBC: 3.04 MIL/uL — ABNORMAL LOW (ref 4.22–5.81)
RDW: 16.7 % — ABNORMAL HIGH (ref 11.5–15.5)
WBC Count: 4.1 10*3/uL (ref 4.0–10.5)
nRBC: 0 % (ref 0.0–0.2)

## 2018-11-01 LAB — CMP (CANCER CENTER ONLY)
ALT: 10 U/L (ref 0–44)
AST: 8 U/L — ABNORMAL LOW (ref 15–41)
Albumin: 3 g/dL — ABNORMAL LOW (ref 3.5–5.0)
Alkaline Phosphatase: 83 U/L (ref 38–126)
Anion gap: 11 (ref 5–15)
BUN: 8 mg/dL (ref 6–20)
CO2: 22 mmol/L (ref 22–32)
Calcium: 7.9 mg/dL — ABNORMAL LOW (ref 8.9–10.3)
Chloride: 105 mmol/L (ref 98–111)
Creatinine: 0.9 mg/dL (ref 0.61–1.24)
GFR, Est AFR Am: >60 mL/min (ref >60)
GFR, Estimated: >60 mL/min (ref >60)
Glucose, Bld: 221 mg/dL — ABNORMAL HIGH (ref 70–99)
Potassium: 3.8 mmol/L (ref 3.5–5.1)
Sodium: 138 mmol/L (ref 135–145)
Total Bilirubin: 0.3 mg/dL (ref 0.3–1.2)
Total Protein: 8.2 g/dL — ABNORMAL HIGH (ref 6.5–8.1)

## 2018-11-01 MED ORDER — ZOLEDRONIC ACID 4 MG/100ML IV SOLN
4.0000 mg | Freq: Once | INTRAVENOUS | Status: AC
  Administered 2018-11-01: 4 mg via INTRAVENOUS
  Filled 2018-11-01: qty 100

## 2018-11-01 MED ORDER — PROCHLORPERAZINE MALEATE 10 MG PO TABS
ORAL_TABLET | ORAL | Status: AC
  Filled 2018-11-01: qty 1

## 2018-11-01 MED ORDER — SODIUM CHLORIDE 0.9 % IV SOLN
Freq: Once | INTRAVENOUS | Status: AC
  Administered 2018-11-01: 10:00:00 via INTRAVENOUS
  Filled 2018-11-01: qty 250

## 2018-11-01 MED ORDER — PROCHLORPERAZINE MALEATE 10 MG PO TABS
10.0000 mg | ORAL_TABLET | Freq: Once | ORAL | Status: AC
  Administered 2018-11-01: 10 mg via ORAL

## 2018-11-01 MED ORDER — BORTEZOMIB CHEMO SQ INJECTION 3.5 MG (2.5MG/ML)
1.3000 mg/m2 | Freq: Once | INTRAMUSCULAR | Status: AC
  Administered 2018-11-01: 3 mg via SUBCUTANEOUS
  Filled 2018-11-01: qty 1.2

## 2018-11-01 NOTE — Progress Notes (Signed)
Ok to treat today w/ ANC 1.3 per Dr. Burr Medico  Per Dr. Burr Medico Pt also to receive Zometa today. Pt instructed to double his Calcium and Vitamin D Dose at home.

## 2018-11-01 NOTE — Patient Instructions (Signed)
[facility] Discharge Instructions for Patients Receiving Chemotherapy  Double your Calcium and Vitamin D vitmins  Today you received the following chemotherapy agents: Velcade and Zometa  To help prevent nausea and vomiting after your treatment, we encourage you to take your nausea medication as directed.    If you develop nausea and vomiting that is not controlled by your nausea medication, call the clinic.   BELOW ARE SYMPTOMS THAT SHOULD BE REPORTED IMMEDIATELY:  *FEVER GREATER THAN 100.5 F  *CHILLS WITH OR WITHOUT FEVER  NAUSEA AND VOMITING THAT IS NOT CONTROLLED WITH YOUR NAUSEA MEDICATION  *UNUSUAL SHORTNESS OF BREATH  *UNUSUAL BRUISING OR BLEEDING  TENDERNESS IN MOUTH AND THROAT WITH OR WITHOUT PRESENCE OF ULCERS  *URINARY PROBLEMS  *BOWEL PROBLEMS  UNUSUAL RASH Items with * indicate a potential emergency and should be followed up as soon as possible.  Feel free to call the clinic should you have any questions or concerns. The clinic phone number is [phone].  Please show the [place] at check-in to the Emergency Department and triage nurse.      Zoledronic Acid injection (Hypercalcemia, Oncology) What is this medicine? ZOLEDRONIC ACID (ZOE le dron ik AS id) lowers the amount of calcium loss from bone. It is used to treat too much calcium in your blood from cancer. It is also used to prevent complications of cancer that has spread to the bone. This medicine may be used for other purposes; ask your health care provider or pharmacist if you have questions. COMMON BRAND NAME(S): Zometa What should I tell my health care provider before I take this medicine? They need to know if you have any of these conditions: -aspirin-sensitive asthma -cancer, especially if you are receiving medicines used to treat cancer -dental disease or wear dentures -infection -kidney disease -receiving corticosteroids like dexamethasone or prednisone -an  unusual or allergic reaction to zoledronic acid, other medicines, foods, dyes, or preservatives -pregnant or trying to get pregnant -breast-feeding How should I use this medicine? This medicine is for infusion into a vein. It is given by a health care professional in a hospital or clinic setting. Talk to your pediatrician regarding the use of this medicine in children. Special care may be needed. Overdosage: If you think you have taken too much of this medicine contact a poison control center or emergency room at once. NOTE: This medicine is only for you. Do not share this medicine with others. What if I miss a dose? It is important not to miss your dose. Call your doctor or health care professional if you are unable to keep an appointment. What may interact with this medicine? -certain antibiotics given by injection -NSAIDs, medicines for pain and inflammation, like ibuprofen or naproxen -some diuretics like bumetanide, furosemide -teriparatide -thalidomide This list may not describe all possible interactions. Give your health care provider a list of all the medicines, herbs, non-prescription drugs, or dietary supplements you use. Also tell them if you smoke, drink alcohol, or use illegal drugs. Some items may interact with your medicine. What should I watch for while using this medicine? Visit your doctor or health care professional for regular checkups. It may be some time before you see the benefit from this medicine. Do not stop taking your medicine unless your doctor tells you to. Your doctor may order blood tests or other tests to see how you are doing. Women should inform their doctor if they wish to become pregnant or think they might be  pregnant. There is a potential for serious side effects to an unborn child. Talk to your health care professional or pharmacist for more information. You should make sure that you get enough calcium and vitamin D while you are taking this medicine.  Discuss the foods you eat and the vitamins you take with your health care professional. Some people who take this medicine have severe bone, joint, and/or muscle pain. This medicine may also increase your risk for jaw problems or a broken thigh bone. Tell your doctor right away if you have severe pain in your jaw, bones, joints, or muscles. Tell your doctor if you have any pain that does not go away or that gets worse. Tell your dentist and dental surgeon that you are taking this medicine. You should not have major dental surgery while on this medicine. See your dentist to have a dental exam and fix any dental problems before starting this medicine. Take good care of your teeth while on this medicine. Make sure you see your dentist for regular follow-up appointments. What side effects may I notice from receiving this medicine? Side effects that you should report to your doctor or health care professional as soon as possible: -allergic reactions like skin rash, itching or hives, swelling of the face, lips, or tongue -anxiety, confusion, or depression -breathing problems -changes in vision -eye pain -feeling faint or lightheaded, falls -jaw pain, especially after dental work -mouth sores -muscle cramps, stiffness, or weakness -redness, blistering, peeling or loosening of the skin, including inside the mouth -trouble passing urine or change in the amount of urine Side effects that usually do not require medical attention (report to your doctor or health care professional if they continue or are bothersome): -bone, joint, or muscle pain -constipation -diarrhea -fever -hair loss -irritation at site where injected -loss of appetite -nausea, vomiting -stomach upset -trouble sleeping -trouble swallowing -weak or tired This list may not describe all possible side effects. Call your doctor for medical advice about side effects. You may report side effects to FDA at 1-800-FDA-1088. Where should I  keep my medicine? This drug is given in a hospital or clinic and will not be stored at home. NOTE: This sheet is a summary. It may not cover all possible information. If you have questions about this medicine, talk to your doctor, pharmacist, or health care provider.  2019 Elsevier/Gold Standard (2013-09-07 14:19:39)

## 2018-11-07 NOTE — Progress Notes (Signed)
[facility]   [phone] Fax:([phone]   Clinic Follow up Note   Patient Care Team: London Pepper, MD as PCP - General (Family Medicine)  Date of Service:  11/09/2018  CHIEF COMPLAINT: F/u of MM  SUMMARY OF ONCOLOGIC HISTORY: Oncology History Overview Note  Cancer Staging Multiple myeloma ([place]) Staging form: Plasma Cell Myeloma and Plasma Cell Disorders, AJCC 8th Edition - Clinical stage from 09/04/2018: RISS Stage III (Beta-2-microglobulin (mg/L): 5.5, Albumin (g/dL): 2.6, ISS: Stage III, High-risk cytogenetics: Absent, LDH: Elevated) - Signed by Truitt Merle, MD on 09/26/2018     Multiple myeloma ([place])  08/31/2018 Initial Diagnosis   Multiple myeloma ([place])   09/04/2018 Cancer Staging   Staging form: Plasma Cell Myeloma and Plasma Cell Disorders, AJCC 8th Edition - Clinical stage from 09/04/2018: RISS Stage III (Beta-2-microglobulin (mg/L): 5.5, Albumin (g/dL): 2.6, ISS: Stage III, High-risk cytogenetics: Absent, LDH: Elevated) - Signed by Truitt Merle, MD on 09/26/2018   09/04/2018 Initial Biopsy   Diagnosis 09/04/18 Bone Marrow, Aspirate,Biopsy, and Clot, left posterior iliac crest BONE MARROW: - CELLULAR MARROW WITH INVOLVEMENT BY PLASMA CELL NEOPLASM (>95%) - SEE COMMENT PERIPHERAL BLOOD: - NORMOCYTIC ANEMIA - ROULEAUX FORMATION - SEE COMPLETE BLOOD COUNT   09/05/2018 -  Chemotherapy   Velcade injection and dexamethasone 52m weekly starting 09/05/18   09/14/2018 -  Chemotherapy   Revlimid 297m2 weeks on/1 week off starting 09/14/18    10/19/2018 -  Chemotherapy   The patient had bortezomib SQ (VELCADE) chemo injection 3 mg, 1.3 mg/m2 = 3 mg, Subcutaneous,  Once, 1 of 4 cycles Administration: 3 mg (10/19/2018), 3 mg (10/25/2018), 3 mg (11/01/2018)  for chemotherapy treatment.       CURRENT THERAPY:  1. Weekly velcade injection and dexamethasone 4028mtarting 09/05/18,Revlimid 77m53mly2 weeks onand 1 week off starting 09/14/18.   -increase Velcade injection to twice a week (Fridays/Mondays) for 2 weeks on, 1 week off starting 10/19/18. Will return to weekly Velcade starting 11/09/18.  2. Monthly Zometa started on 09/05/2018  INTERVAL HISTORY:  Edward Naborhere for a follow up and treatment. He presents to the clinic alone. He called his wife to be included in the visit. He notes he is doing well. He notes since being anemic he is very cold at home so he had trouble sleeping last night. He notes he ambulates at home with cane to increase his activity. He feels he does overdo it so the next day he may not be up as much. He tries to be up for 30 minutes at a time. When up too long his side will hurt. He will still do exercises in the morning to help him build strength. He is also able to be more independent like be able to go to the bathroom by himself. He notes he does have PT come see him but they come at the worst times for him. He likes to move at his own pace.  He is taking Oxycodone 2-3 a day and does not ask for tylenol in between as much before.   He notes with increased Velcade of 2 week on/1 week off he did not tolerate well and wanted to return to weekly.  He plans to have consult at [facility]. He needs help with his medical bills as they come in.    REVIEW OF SYSTEMS:   Constitutional: Denies fevers, chills or abnormal weight loss (+) Cold from anemia  Eyes: Denies blurriness of vision Ears, nose, mouth, throat,  and face: Denies mucositis or sore throat Respiratory: Denies cough, dyspnea or wheezes Cardiovascular: Denies palpitation, chest discomfort or lower extremity swelling Gastrointestinal:  Denies nausea, heartburn or change in bowel habits Skin: Denies abnormal skin rashes Lymphatics: Denies new lymphadenopathy or easy bruising Neurological:Denies numbness, tingling or new weaknesses Behavioral/Psych: Mood is stable, no new changes  All other systems were reviewed with the patient and are  negative.  MEDICAL HISTORY:  Past Medical History:  Diagnosis Date  . Diabetes mellitus without complication ([place])   . Hypertension     SURGICAL HISTORY: History reviewed. No pertinent surgical history.  I have reviewed the social history and family history with the patient and they are unchanged from previous note.  ALLERGIES:  is allergic to shellfish allergy.  MEDICATIONS:  Current Outpatient Medications  Medication Sig Dispense Refill  . acetaminophen (TYLENOL) 325 MG tablet Take 2 tablets (650 mg total) by mouth every 6 (six) hours as needed for mild pain or fever.    Marland Kitchen acyclovir (ZOVIRAX) 400 MG tablet Take 1 tablet (400 mg total) by mouth 2 (two) times daily. 60 tablet 0  . aspirin 81 MG chewable tablet Chew 1 tablet (81 mg total) by mouth daily.    . calcium-vitamin D (OSCAL WITH D) 500-200 MG-UNIT tablet Take 1 tablet by mouth 2 (two) times daily. 60 tablet 1  . dexamethasone (DECADRON) 4 MG tablet Take 10 tablets (40 mg total) by mouth once a week. 40 tablet 2  . metFORMIN (GLUCOPHAGE) 1000 MG tablet Take 1 tablet (1,000 mg total) by mouth 2 (two) times daily with a meal. 60 tablet 0  . methocarbamol (ROBAXIN) 750 MG tablet Take 1 tablet (750 mg total) by mouth 3 (three) times daily. 90 tablet 2  . ondansetron (ZOFRAN-ODT) 4 MG disintegrating tablet Take 1 tablet (4 mg total) by mouth every 6 (six) hours as needed for nausea or vomiting. 20 tablet 0  . Oxycodone HCl 10 MG TABS Take 1 tablet (10 mg total) by mouth every 6 (six) hours as needed. 90 tablet 0  . pantoprazole (PROTONIX) 40 MG tablet Take 1 tablet (40 mg total) by mouth daily at 6 (six) AM. 30 tablet 0  . polyethylene glycol (MIRALAX / GLYCOLAX) 17 g packet Take 17 g by mouth daily. 14 each 0  . REVLIMID 25 MG capsule TAKE 1 CAPSULE BY MOUTH  DAILY FOR 14 DAYS, THEN 7  DAYS OFF 14 capsule 0  . senna-docusate (SENOKOT-S) 8.6-50 MG tablet Take 1 tablet by mouth 2 (two) times daily.     No current  facility-administered medications for this visit.     PHYSICAL EXAMINATION: ECOG PERFORMANCE STATUS: 3 - Symptomatic, >50% confined to bed  Vitals:   11/09/18 0820  BP: 115/88  Pulse: 79  Resp: 20  Temp: 99.1 F (37.3 C)  SpO2: 100%   Filed Weights   11/09/18 0820  Weight: 230 lb 11.2 oz (104.6 kg)    GENERAL:alert, no distress and comfortable SKIN: skin color, texture, turgor are normal, no rashes or significant lesions EYES: normal, Conjunctiva are pink and non-injected, sclera clear  NECK: supple, thyroid normal size, non-tender, without nodularity LYMPH:  no palpable lymphadenopathy in the cervical, axillary  LUNGS: clear to auscultation and percussion with normal breathing effort HEART: regular rate & rhythm and no murmurs and no lower extremity edema ABDOMEN:abdomen soft, non-tender and normal bowel sounds Musculoskeletal:no cyanosis of digits and no clubbing  NEURO: alert & oriented x 3 with fluent speech, no focal  motor/sensory deficits  LABORATORY DATA:  I have reviewed the data as listed CBC Latest Ref Rng & Units 11/09/2018 11/01/2018 10/25/2018  WBC 4.0 - 10.5 K/uL 5.1 4.1 5.5  Hemoglobin 13.0 - 17.0 g/dL 9.4(L) 9.4(L) 9.2(L)  Hematocrit 39.0 - 52.0 % 27.4(L) 28.4(L) 27.5(L)  Platelets 150 - 400 K/uL 230 167 202     CMP Latest Ref Rng & Units 11/09/2018 11/01/2018 10/25/2018  Glucose 70 - 99 mg/dL 140(H) 221(H) 162(H)  BUN 6 - 20 mg/dL '7 8 8  ' Creatinine 0.61 - 1.24 mg/dL 0.79 0.90 0.81  Sodium 135 - 145 mmol/L 138 138 137  Potassium 3.5 - 5.1 mmol/L 3.6 3.8 3.9  Chloride 98 - 111 mmol/L 105 105 107  CO2 22 - 32 mmol/L 22 22 19(L)  Calcium 8.9 - 10.3 mg/dL 7.9(L) 7.9(L) 7.9(L)  Total Protein 6.5 - 8.1 g/dL 7.7 8.2(H) 8.2(H)  Total Bilirubin 0.3 - 1.2 mg/dL 0.3 0.3 0.3  Alkaline Phos 38 - 126 U/L 78 83 82  AST 15 - 41 U/L 9(L) 8(L) 9(L)  ALT 0 - 44 U/L '10 10 11      ' RADIOGRAPHIC STUDIES: I have personally reviewed the radiological images as listed and  agreed with the findings in the report. No results found.   ASSESSMENT & PLAN:  Robert Sperl is a 42 y.o. male with   1. 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 Work upconfirmed multiple myeloma, unfortunately he has multiple compression fracture of thoracic and lumbar spine. -I started him oninduction chemo with XBM:WUXLKG Velcade injections with Dexamethasone started on 09/05/18 and oral Revlimid on 09/14/18. -His cytogeneticsand FISHresults showed trisomy 11, FISH panel otherwise negative, this is considered a standard risk.  -He is a candidate for stem cell transplant. He will have consult with [facility] for transplant consultation in 11/2018. Given his standard risk disease, he may respond well to induction chemotherapy, bone marrowtransplant could bedeferred if he has good response to induction chemo. -I previously recommend PET scan for base line, but it was denied by his insurance  -He continues to recover post hospitalization. He is eating adequately, exercising with improvement of strength and mobility.  -He did not tolerate 2 week on/1 week off Velcade well. Will return to weekly Velcade starting 11/09/18.  -Labs reviewed, CBC and CMP WNL except Hg 9.4, BG 140, Ca 7.9, Albumin 3.1. M-protein continues to trend down, latest at 2.3. Will proceed with Velcade injection today and weekly.  -He will continue oral Dexa 45m on Fridays weekly and Revlimid.  -I reviewed side effect from long term steroid use such as risk of infection, body habit change, myopathy, changes and hyperglycemia. I encouraged him to remain active and to see endocrinologist or PCP to manage his DM.  -f/u in 3 weeks  with Zometa infusion. Continue monthly zometa   2. Anemia, secondary to MM -He required blood transfusion 09/12/18.  -On Oral iron  -Labs reviewed, Hg at 9.4 today (7/17//20), improved   -Continue to monitor.   3. Severe low back pain  -secondary to thoracic and lumbar compression fractures,lumbar spine stenosisseen on 08/31/18 bone survey.  -He has completed rehab and currently doing PT  -He is on Robaxin 7585mand oxycodone 1063m-4 times a day as needed which is mostly controlling his pain.  -I discussed to reduce longterm use of narcotics I recommend he use half tablet if pain is improving. -His peripheral strength  and ambulation continues to slowly improve. He will continue PT  -He will continue stool softeners as needed to prevent constipation from pain meds.  -Given he needs pain medication less, he will decrease oxycodone to 9m q6hr as needed. He can continue Tylenol as needed in between. (11/09/18)  4. DM, Type II -On Metformin BID -I again encouraged him to reduce carbohydrates, juice, soft drinks in diet.  -Will monitor on dexamethasone. -BG at 140 today (7/17//20). I previously referred him to endocrinologist to be better managed. He is agreeable  -He still has not seen a physician to manage his DM. I strongly encourage he see one very soon.   5. Financial support -I offered him our [facility] resources that are available to him. -He does having insurance and he does plan to apply for disability.  -I will set up consult with financial advocate.    PLAN: -Decrease Oxycodone to half tablet as needed  -Labs reviewed, will proceed with Velcade injection today and will change Velcade back to Weekly  -continue weekly dexa 441mand Revlimid 2 weeks on and 1 week off -Lab, f/u and Zometa in 3 weeks    No problem-specific Assessment & Plan notes found for this encounter.   No orders of the defined types were placed in this encounter.  All questions were answered. The patient knows to call the clinic with any problems, questions or concerns. No barriers to learning was detected. I spent 20 minutes counseling the patient face to face. The total time spent  in the appointment was 25 minutes and more than 50% was on counseling and review of test results     YaTruitt MerleMD 11/09/2018   I, AmJoslyn Devonam acting as scribe for YaTruitt MerleMD.   I have reviewed the above documentation for accuracy and completeness, and I agree with the above.

## 2018-11-08 ENCOUNTER — Other Ambulatory Visit: Payer: Self-pay | Admitting: Hematology

## 2018-11-08 ENCOUNTER — Telehealth: Payer: Self-pay

## 2018-11-08 DIAGNOSIS — C9000 Multiple myeloma not having achieved remission: Secondary | ICD-10-CM

## 2018-11-08 NOTE — Telephone Encounter (Signed)
Faxed script for Revlimid and Celgene authorization to [facility] at [phone], sent to HIM for scan to chart.

## 2018-11-09 ENCOUNTER — Other Ambulatory Visit: Payer: Self-pay

## 2018-11-09 ENCOUNTER — Inpatient Hospital Stay: Payer: 59

## 2018-11-09 ENCOUNTER — Encounter: Payer: Self-pay | Admitting: Hematology

## 2018-11-09 ENCOUNTER — Inpatient Hospital Stay (HOSPITAL_BASED_OUTPATIENT_CLINIC_OR_DEPARTMENT_OTHER): Payer: 59 | Admitting: Hematology

## 2018-11-09 VITALS — BP 115/88 | HR 79 | Temp 99.1°F | Resp 20 | Ht 76.0 in | Wt 230.7 lb

## 2018-11-09 DIAGNOSIS — E119 Type 2 diabetes mellitus without complications: Secondary | ICD-10-CM

## 2018-11-09 DIAGNOSIS — Z7982 Long term (current) use of aspirin: Secondary | ICD-10-CM

## 2018-11-09 DIAGNOSIS — C9000 Multiple myeloma not having achieved remission: Secondary | ICD-10-CM

## 2018-11-09 DIAGNOSIS — M48061 Spinal stenosis, lumbar region without neurogenic claudication: Secondary | ICD-10-CM

## 2018-11-09 DIAGNOSIS — D630 Anemia in neoplastic disease: Secondary | ICD-10-CM | POA: Diagnosis not present

## 2018-11-09 DIAGNOSIS — Z5111 Encounter for antineoplastic chemotherapy: Secondary | ICD-10-CM | POA: Diagnosis not present

## 2018-11-09 DIAGNOSIS — G4700 Insomnia, unspecified: Secondary | ICD-10-CM

## 2018-11-09 DIAGNOSIS — I10 Essential (primary) hypertension: Secondary | ICD-10-CM

## 2018-11-09 DIAGNOSIS — Z9221 Personal history of antineoplastic chemotherapy: Secondary | ICD-10-CM

## 2018-11-09 DIAGNOSIS — Z7984 Long term (current) use of oral hypoglycemic drugs: Secondary | ICD-10-CM

## 2018-11-09 DIAGNOSIS — Z79899 Other long term (current) drug therapy: Secondary | ICD-10-CM

## 2018-11-09 LAB — CMP (CANCER CENTER ONLY)
ALT: 10 U/L (ref 0–44)
AST: 9 U/L — ABNORMAL LOW (ref 15–41)
Albumin: 3.1 g/dL — ABNORMAL LOW (ref 3.5–5.0)
Alkaline Phosphatase: 78 U/L (ref 38–126)
Anion gap: 11 (ref 5–15)
BUN: 7 mg/dL (ref 6–20)
CO2: 22 mmol/L (ref 22–32)
Calcium: 7.9 mg/dL — ABNORMAL LOW (ref 8.9–10.3)
Chloride: 105 mmol/L (ref 98–111)
Creatinine: 0.79 mg/dL (ref 0.61–1.24)
GFR, Est AFR Am: >60 mL/min (ref >60)
GFR, Estimated: >60 mL/min (ref >60)
Glucose, Bld: 140 mg/dL — ABNORMAL HIGH (ref 70–99)
Potassium: 3.6 mmol/L (ref 3.5–5.1)
Sodium: 138 mmol/L (ref 135–145)
Total Bilirubin: 0.3 mg/dL (ref 0.3–1.2)
Total Protein: 7.7 g/dL (ref 6.5–8.1)

## 2018-11-09 LAB — CBC WITH DIFFERENTIAL (CANCER CENTER ONLY)
Abs Immature Granulocytes: 0.01 10*3/uL (ref 0.00–0.07)
Basophils Absolute: 0 10*3/uL (ref 0.0–0.1)
Basophils Relative: 0 %
Eosinophils Absolute: 0.2 10*3/uL (ref 0.0–0.5)
Eosinophils Relative: 3 %
HCT: 27.4 % — ABNORMAL LOW (ref 39.0–52.0)
Hemoglobin: 9.4 g/dL — ABNORMAL LOW (ref 13.0–17.0)
Immature Granulocytes: 0 %
Lymphocytes Relative: 54 %
Lymphs Abs: 2.8 10*3/uL (ref 0.7–4.0)
MCH: 30.9 pg (ref 26.0–34.0)
MCHC: 34.3 g/dL (ref 30.0–36.0)
MCV: 90.1 fL (ref 80.0–100.0)
Monocytes Absolute: 0.5 10*3/uL (ref 0.1–1.0)
Monocytes Relative: 10 %
Neutro Abs: 1.7 10*3/uL (ref 1.7–7.7)
Neutrophils Relative %: 33 %
Platelet Count: 230 10*3/uL (ref 150–400)
RBC: 3.04 MIL/uL — ABNORMAL LOW (ref 4.22–5.81)
RDW: 15.9 % — ABNORMAL HIGH (ref 11.5–15.5)
WBC Count: 5.1 10*3/uL (ref 4.0–10.5)
nRBC: 0 % (ref 0.0–0.2)

## 2018-11-09 MED ORDER — BORTEZOMIB CHEMO SQ INJECTION 3.5 MG (2.5MG/ML)
1.3000 mg/m2 | Freq: Once | INTRAMUSCULAR | Status: AC
  Administered 2018-11-09: 3 mg via SUBCUTANEOUS
  Filled 2018-11-09: qty 1.2

## 2018-11-09 MED ORDER — PROCHLORPERAZINE MALEATE 10 MG PO TABS
10.0000 mg | ORAL_TABLET | Freq: Once | ORAL | Status: AC
  Administered 2018-11-09: 09:00:00 10 mg via ORAL

## 2018-11-09 MED ORDER — PROCHLORPERAZINE MALEATE 10 MG PO TABS
ORAL_TABLET | ORAL | Status: AC
  Filled 2018-11-09: qty 1

## 2018-11-09 NOTE — Patient Instructions (Signed)
[facility] Discharge Instructions for Patients Receiving Chemotherapy  Today you received the following chemotherapy agents: Velcade   To help prevent nausea and vomiting after your treatment, we encourage you to take your nausea medication as directed.    If you develop nausea and vomiting that is not controlled by your nausea medication, call the clinic.   BELOW ARE SYMPTOMS THAT SHOULD BE REPORTED IMMEDIATELY:  *FEVER GREATER THAN 100.5 F  *CHILLS WITH OR WITHOUT FEVER  NAUSEA AND VOMITING THAT IS NOT CONTROLLED WITH YOUR NAUSEA MEDICATION  *UNUSUAL SHORTNESS OF BREATH  *UNUSUAL BRUISING OR BLEEDING  TENDERNESS IN MOUTH AND THROAT WITH OR WITHOUT PRESENCE OF ULCERS  *URINARY PROBLEMS  *BOWEL PROBLEMS  UNUSUAL RASH Items with * indicate a potential emergency and should be followed up as soon as possible.  Feel free to call the clinic should you have any questions or concerns. The clinic phone number is [phone].  Please show the [place] at check-in to the Emergency Department and triage nurse.  Coronavirus (COVID-19) Are you at risk?  Are you at risk for the Coronavirus (COVID-19)?  To be considered HIGH RISK for Coronavirus (COVID-19), you have to meet the following criteria:  . Traveled to [place], [place], [place], [place] or [place]; or in the [place] to [place], [place], [place], or [place]; and have fever, cough, and shortness of breath within the last 2 weeks of travel OR . Been in close contact with a person diagnosed with COVID-19 within the last 2 weeks and have fever, cough, and shortness of breath . IF YOU DO NOT MEET THESE CRITERIA, YOU ARE CONSIDERED LOW RISK FOR COVID-19.  What to do if you are HIGH RISK for COVID-19?  Marland Kitchen If you are having a medical emergency, call 911. . Seek medical care right away. Before you go to a doctor's office, urgent care or emergency department, call ahead and tell them about your  recent travel, contact with someone diagnosed with COVID-19, and your symptoms. You should receive instructions from your physician's office regarding next steps of care.  . When you arrive at healthcare provider, tell the healthcare staff immediately you have returned from visiting [place], [place], [place], [place] or [place]; or traveled in the [place] to [place], [place], [place], or [place]; in the last two weeks or you have been in close contact with a person diagnosed with COVID-19 in the last 2 weeks.   . Tell the health care staff about your symptoms: fever, cough and shortness of breath. . After you have been seen by a medical provider, you will be either: o Tested for (COVID-19) and discharged home on quarantine except to seek medical care if symptoms worsen, and asked to  - Stay home and avoid contact with others until you get your results (4-5 days)  - Avoid travel on public transportation if possible (such as bus, train, or airplane) or o Sent to the Emergency Department by EMS for evaluation, COVID-19 testing, and possible admission depending on your condition and test results.  What to do if you are LOW RISK for COVID-19?  Reduce your risk of any infection by using the same precautions used for avoiding the common cold or flu:  Marland Kitchen Wash your hands often with soap and warm water for at least 20 seconds.  If soap and water are not readily available, use an alcohol-based hand sanitizer with at least 60% alcohol.  . If coughing  or sneezing, cover your mouth and nose by coughing or sneezing into the elbow areas of your shirt or coat, into a tissue or into your sleeve (not your hands). . Avoid shaking hands with others and consider head nods or verbal greetings only. . Avoid touching your eyes, nose, or mouth with unwashed hands.  . Avoid close contact with people who are sick. . Avoid places or events with large numbers of people in one location, like concerts or sporting  events. . Carefully consider travel plans you have or are making. . If you are planning any travel outside or inside the [place], visit the CDC's Travelers' Health webpage for the latest health notices. . If you have some symptoms but not all symptoms, continue to monitor at home and seek medical attention if your symptoms worsen. . If you are having a medical emergency, call 911.   [place] / e-Visit: [email or web address]         [facility]: [facility]: [phone]                   [facility]: [phone]

## 2018-11-12 ENCOUNTER — Inpatient Hospital Stay: Payer: 59

## 2018-11-12 ENCOUNTER — Telehealth: Payer: Self-pay | Admitting: Hematology

## 2018-11-12 NOTE — Telephone Encounter (Signed)
Scheduled appt per 7/17 los. °

## 2018-11-16 ENCOUNTER — Inpatient Hospital Stay: Payer: 59

## 2018-11-16 ENCOUNTER — Other Ambulatory Visit: Payer: Self-pay

## 2018-11-16 ENCOUNTER — Other Ambulatory Visit: Payer: Self-pay | Admitting: Hematology

## 2018-11-16 VITALS — BP 129/93 | HR 92 | Temp 98.6°F | Resp 20

## 2018-11-16 DIAGNOSIS — C9000 Multiple myeloma not having achieved remission: Secondary | ICD-10-CM

## 2018-11-16 DIAGNOSIS — Z5111 Encounter for antineoplastic chemotherapy: Secondary | ICD-10-CM | POA: Diagnosis not present

## 2018-11-16 LAB — CBC WITH DIFFERENTIAL (CANCER CENTER ONLY)
Abs Immature Granulocytes: 0 10*3/uL (ref 0.00–0.07)
Basophils Absolute: 0 10*3/uL (ref 0.0–0.1)
Basophils Relative: 0 %
Eosinophils Absolute: 0 10*3/uL (ref 0.0–0.5)
Eosinophils Relative: 1 %
HCT: 28.6 % — ABNORMAL LOW (ref 39.0–52.0)
Hemoglobin: 9.7 g/dL — ABNORMAL LOW (ref 13.0–17.0)
Immature Granulocytes: 0 %
Lymphocytes Relative: 68 %
Lymphs Abs: 3.5 10*3/uL (ref 0.7–4.0)
MCH: 30.9 pg (ref 26.0–34.0)
MCHC: 33.9 g/dL (ref 30.0–36.0)
MCV: 91.1 fL (ref 80.0–100.0)
Monocytes Absolute: 0.4 10*3/uL (ref 0.1–1.0)
Monocytes Relative: 8 %
Neutro Abs: 1.2 10*3/uL — ABNORMAL LOW (ref 1.7–7.7)
Neutrophils Relative %: 23 %
Platelet Count: 201 10*3/uL (ref 150–400)
RBC: 3.14 MIL/uL — ABNORMAL LOW (ref 4.22–5.81)
RDW: 15.9 % — ABNORMAL HIGH (ref 11.5–15.5)
WBC Count: 5.1 10*3/uL (ref 4.0–10.5)
nRBC: 0 % (ref 0.0–0.2)

## 2018-11-16 LAB — CMP (CANCER CENTER ONLY)
ALT: 8 U/L (ref 0–44)
AST: 9 U/L — ABNORMAL LOW (ref 15–41)
Albumin: 3.3 g/dL — ABNORMAL LOW (ref 3.5–5.0)
Alkaline Phosphatase: 87 U/L (ref 38–126)
Anion gap: 9 (ref 5–15)
BUN: 11 mg/dL (ref 6–20)
CO2: 22 mmol/L (ref 22–32)
Calcium: 8.6 mg/dL — ABNORMAL LOW (ref 8.9–10.3)
Chloride: 107 mmol/L (ref 98–111)
Creatinine: 0.82 mg/dL (ref 0.61–1.24)
GFR, Est AFR Am: >60 mL/min (ref >60)
GFR, Estimated: >60 mL/min (ref >60)
Glucose, Bld: 128 mg/dL — ABNORMAL HIGH (ref 70–99)
Potassium: 3.8 mmol/L (ref 3.5–5.1)
Sodium: 138 mmol/L (ref 135–145)
Total Bilirubin: 0.4 mg/dL (ref 0.3–1.2)
Total Protein: 7.9 g/dL (ref 6.5–8.1)

## 2018-11-16 MED ORDER — PROCHLORPERAZINE MALEATE 10 MG PO TABS
ORAL_TABLET | ORAL | Status: AC
  Filled 2018-11-16: qty 1

## 2018-11-16 MED ORDER — PROCHLORPERAZINE MALEATE 10 MG PO TABS
10.0000 mg | ORAL_TABLET | Freq: Once | ORAL | Status: AC
  Administered 2018-11-16: 10 mg via ORAL

## 2018-11-16 MED ORDER — BORTEZOMIB CHEMO SQ INJECTION 3.5 MG (2.5MG/ML)
1.3000 mg/m2 | Freq: Once | INTRAMUSCULAR | Status: AC
  Administered 2018-11-16: 3 mg via SUBCUTANEOUS
  Filled 2018-11-16: qty 1.2

## 2018-11-16 NOTE — Patient Instructions (Signed)
Coronavirus (COVID-19) Are you at risk?  Are you at risk for the Coronavirus (COVID-19)?  To be considered HIGH RISK for Coronavirus (COVID-19), you have to meet the following criteria:  . Traveled to [place], [place], [place], [place] or [place]; or in the [place] to [place], [place], [place], or [place]; and have fever, cough, and shortness of breath within the last 2 weeks of travel OR . Been in close contact with a person diagnosed with COVID-19 within the last 2 weeks and have fever, cough, and shortness of breath . IF YOU DO NOT MEET THESE CRITERIA, YOU ARE CONSIDERED LOW RISK FOR COVID-19.  What to do if you are HIGH RISK for COVID-19?  . If you are having a medical emergency, call 911. . Seek medical care right away. Before you go to a doctor's office, urgent care or emergency department, call ahead and tell them about your recent travel, contact with someone diagnosed with COVID-19, and your symptoms. You should receive instructions from your physician's office regarding next steps of care.  . When you arrive at healthcare provider, tell the healthcare staff immediately you have returned from visiting [place], [place], [place], [place] or [place]; or traveled in the [place] to [place], [place], [place], or [place]; in the last two weeks or you have been in close contact with a person diagnosed with COVID-19 in the last 2 weeks.   . Tell the health care staff about your symptoms: fever, cough and shortness of breath. . After you have been seen by a medical provider, you will be either: o Tested for (COVID-19) and discharged home on quarantine except to seek medical care if symptoms worsen, and asked to  - Stay home and avoid contact with others until you get your results (4-5 days)  - Avoid travel on public transportation if possible (such as bus, train, or airplane) or o Sent to the Emergency Department by EMS for evaluation, COVID-19 testing, and possible  admission depending on your condition and test results.  What to do if you are LOW RISK for COVID-19?  Reduce your risk of any infection by using the same precautions used for avoiding the common cold or flu:  . Wash your hands often with soap and warm water for at least 20 seconds.  If soap and water are not readily available, use an alcohol-based hand sanitizer with at least 60% alcohol.  . If coughing or sneezing, cover your mouth and nose by coughing or sneezing into the elbow areas of your shirt or coat, into a tissue or into your sleeve (not your hands). . Avoid shaking hands with others and consider head nods or verbal greetings only. . Avoid touching your eyes, nose, or mouth with unwashed hands.  . Avoid close contact with people who are sick. . Avoid places or events with large numbers of people in one location, like concerts or sporting events. . Carefully consider travel plans you have or are making. . If you are planning any travel outside or inside the [place], visit the CDC's Travelers' Health webpage for the latest health notices. . If you have some symptoms but not all symptoms, continue to monitor at home and seek medical attention if your symptoms worsen. . If you are having a medical emergency, call 911.   ADDITIONAL HEALTHCARE OPTIONS FOR PATIENTS  [facility] / e-Visit: [email or web address]         [facility]: [phone]  [facility]: [phone]                   [facility]: [phone]    [facility] Discharge Instructions for Patients Receiving Chemotherapy  Today you received the following chemotherapy agents Velcade  To help prevent nausea and vomiting after your treatment, we encourage you to take your nausea medication as directed   If you develop nausea and vomiting that is not controlled by your nausea medication, call the clinic.   BELOW ARE  SYMPTOMS THAT SHOULD BE REPORTED IMMEDIATELY:  *FEVER GREATER THAN 100.5 F  *CHILLS WITH OR WITHOUT FEVER  NAUSEA AND VOMITING THAT IS NOT CONTROLLED WITH YOUR NAUSEA MEDICATION  *UNUSUAL SHORTNESS OF BREATH  *UNUSUAL BRUISING OR BLEEDING  TENDERNESS IN MOUTH AND THROAT WITH OR WITHOUT PRESENCE OF ULCERS  *URINARY PROBLEMS  *BOWEL PROBLEMS  UNUSUAL RASH Items with * indicate a potential emergency and should be followed up as soon as possible.  Feel free to call the clinic should you have any questions or concerns. The clinic phone number is [phone].  Please show the CHEMO ALERT CARD at check-in to the Emergency Department and triage nurse.   

## 2018-11-16 NOTE — Progress Notes (Signed)
Per Dr. Feng okay to treat with ANC 1.2. 

## 2018-11-19 ENCOUNTER — Encounter: Payer: 59 | Attending: Registered Nurse | Admitting: Physical Medicine & Rehabilitation

## 2018-11-19 ENCOUNTER — Other Ambulatory Visit: Payer: Self-pay

## 2018-11-19 ENCOUNTER — Ambulatory Visit: Payer: 59

## 2018-11-19 ENCOUNTER — Other Ambulatory Visit: Payer: Self-pay | Admitting: Nurse Practitioner

## 2018-11-19 ENCOUNTER — Telehealth: Payer: Self-pay

## 2018-11-19 DIAGNOSIS — E119 Type 2 diabetes mellitus without complications: Secondary | ICD-10-CM | POA: Insufficient documentation

## 2018-11-19 DIAGNOSIS — R5381 Other malaise: Secondary | ICD-10-CM | POA: Insufficient documentation

## 2018-11-19 DIAGNOSIS — C9000 Multiple myeloma not having achieved remission: Secondary | ICD-10-CM

## 2018-11-19 DIAGNOSIS — M62838 Other muscle spasm: Secondary | ICD-10-CM | POA: Insufficient documentation

## 2018-11-19 MED ORDER — PANTOPRAZOLE SODIUM 40 MG PO TBEC: 40.0000 mg | DELAYED_RELEASE_TABLET | Freq: Every day | ORAL | 2 refills | Status: DC

## 2018-11-19 MED ORDER — OXYCODONE HCL 10 MG PO TABS: 5.0000 mg | ORAL_TABLET | Freq: Four times a day (QID) | ORAL | 0 refills | Status: DC | PRN

## 2018-11-19 NOTE — Progress Notes (Signed)
I called patient to evaluate his pain. He rates low back pain 8/10 currently. He is more active lately so pain has increased. He tries to take 0.5 tab, but usually ends up needing whole 10 mg. I reviewed addictive nature of this medication and our goal is to decrease pain meds as his pain improves. I recommend taking 0.5 to 1 tab q6 PRN. He can take tylenol in the interval to reduce the need for whole tab or to potentially increase the interval between doses. He understands. He has some constipation, I recommend miralax at least once daily. He also needs refill protonix but denies reflux issues. Both meds refilled today.  Cira Rue, NP  11/19/2018

## 2018-11-19 NOTE — Telephone Encounter (Signed)
Patient calls for refill on Oxycodone 10 mg taking one every 6 hours as needed, last filled 6/26 #90.  Please send into [facility] on file.   ________________________________ Message was given to Cira Rue NP for refill.

## 2018-11-22 ENCOUNTER — Other Ambulatory Visit: Payer: Self-pay | Admitting: Hematology

## 2018-11-22 DIAGNOSIS — C9000 Multiple myeloma not having achieved remission: Secondary | ICD-10-CM

## 2018-11-23 ENCOUNTER — Other Ambulatory Visit: Payer: Self-pay

## 2018-11-23 ENCOUNTER — Inpatient Hospital Stay: Payer: 59

## 2018-11-23 VITALS — BP 115/94 | HR 85 | Temp 98.5°F | Resp 20

## 2018-11-23 DIAGNOSIS — C9000 Multiple myeloma not having achieved remission: Secondary | ICD-10-CM

## 2018-11-23 DIAGNOSIS — Z5111 Encounter for antineoplastic chemotherapy: Secondary | ICD-10-CM | POA: Diagnosis not present

## 2018-11-23 LAB — CMP (CANCER CENTER ONLY)
ALT: 9 U/L (ref 0–44)
AST: 8 U/L — ABNORMAL LOW (ref 15–41)
Albumin: 3.5 g/dL (ref 3.5–5.0)
Alkaline Phosphatase: 88 U/L (ref 38–126)
Anion gap: 10 (ref 5–15)
BUN: 7 mg/dL (ref 6–20)
CO2: 23 mmol/L (ref 22–32)
Calcium: 8.5 mg/dL — ABNORMAL LOW (ref 8.9–10.3)
Chloride: 105 mmol/L (ref 98–111)
Creatinine: 0.83 mg/dL (ref 0.61–1.24)
GFR, Est AFR Am: >60 mL/min (ref >60)
GFR, Estimated: >60 mL/min (ref >60)
Glucose, Bld: 122 mg/dL — ABNORMAL HIGH (ref 70–99)
Potassium: 3.5 mmol/L (ref 3.5–5.1)
Sodium: 138 mmol/L (ref 135–145)
Total Bilirubin: 0.4 mg/dL (ref 0.3–1.2)
Total Protein: 8.1 g/dL (ref 6.5–8.1)

## 2018-11-23 LAB — CBC WITH DIFFERENTIAL (CANCER CENTER ONLY)
Abs Immature Granulocytes: 0.01 10*3/uL (ref 0.00–0.07)
Basophils Absolute: 0 10*3/uL (ref 0.0–0.1)
Basophils Relative: 0 %
Eosinophils Absolute: 0.1 10*3/uL (ref 0.0–0.5)
Eosinophils Relative: 3 %
HCT: 31 % — ABNORMAL LOW (ref 39.0–52.0)
Hemoglobin: 10.4 g/dL — ABNORMAL LOW (ref 13.0–17.0)
Immature Granulocytes: 0 %
Lymphocytes Relative: 58 %
Lymphs Abs: 2.7 10*3/uL (ref 0.7–4.0)
MCH: 30.9 pg (ref 26.0–34.0)
MCHC: 33.5 g/dL (ref 30.0–36.0)
MCV: 92 fL (ref 80.0–100.0)
Monocytes Absolute: 0.2 10*3/uL (ref 0.1–1.0)
Monocytes Relative: 4 %
Neutro Abs: 1.7 10*3/uL (ref 1.7–7.7)
Neutrophils Relative %: 35 %
Platelet Count: 213 10*3/uL (ref 150–400)
RBC: 3.37 MIL/uL — ABNORMAL LOW (ref 4.22–5.81)
RDW: 15.9 % — ABNORMAL HIGH (ref 11.5–15.5)
WBC Count: 4.7 10*3/uL (ref 4.0–10.5)
nRBC: 0 % (ref 0.0–0.2)

## 2018-11-23 MED ORDER — PROCHLORPERAZINE MALEATE 10 MG PO TABS
10.0000 mg | ORAL_TABLET | Freq: Once | ORAL | Status: AC
  Administered 2018-11-23: 10 mg via ORAL

## 2018-11-23 MED ORDER — PROCHLORPERAZINE MALEATE 10 MG PO TABS
ORAL_TABLET | ORAL | Status: AC
  Filled 2018-11-23: qty 1

## 2018-11-23 MED ORDER — BORTEZOMIB CHEMO SQ INJECTION 3.5 MG (2.5MG/ML)
1.3000 mg/m2 | Freq: Once | INTRAMUSCULAR | Status: AC
  Administered 2018-11-23: 3 mg via SUBCUTANEOUS
  Filled 2018-11-23: qty 1.2

## 2018-11-23 NOTE — Patient Instructions (Signed)
Coronavirus (COVID-19) Are you at risk?  Are you at risk for the Coronavirus (COVID-19)?  To be considered HIGH RISK for Coronavirus (COVID-19), you have to meet the following criteria:  . Traveled to [place], [place], [place], [place] or [place]; or in the [place] to [place], [place], [place], or [place]; and have fever, cough, and shortness of breath within the last 2 weeks of travel OR . Been in close contact with a person diagnosed with COVID-19 within the last 2 weeks and have fever, cough, and shortness of breath . IF YOU DO NOT MEET THESE CRITERIA, YOU ARE CONSIDERED LOW RISK FOR COVID-19.  What to do if you are HIGH RISK for COVID-19?  . If you are having a medical emergency, call 911. . Seek medical care right away. Before you go to a doctor's office, urgent care or emergency department, call ahead and tell them about your recent travel, contact with someone diagnosed with COVID-19, and your symptoms. You should receive instructions from your physician's office regarding next steps of care.  . When you arrive at healthcare provider, tell the healthcare staff immediately you have returned from visiting [place], [place], [place], [place] or [place]; or traveled in the [place] to [place], [place], [place], or [place]; in the last two weeks or you have been in close contact with a person diagnosed with COVID-19 in the last 2 weeks.   . Tell the health care staff about your symptoms: fever, cough and shortness of breath. . After you have been seen by a medical provider, you will be either: o Tested for (COVID-19) and discharged home on quarantine except to seek medical care if symptoms worsen, and asked to  - Stay home and avoid contact with others until you get your results (4-5 days)  - Avoid travel on public transportation if possible (such as bus, train, or airplane) or o Sent to the Emergency Department by EMS for evaluation, COVID-19 testing, and possible  admission depending on your condition and test results.  What to do if you are LOW RISK for COVID-19?  Reduce your risk of any infection by using the same precautions used for avoiding the common cold or flu:  . Wash your hands often with soap and warm water for at least 20 seconds.  If soap and water are not readily available, use an alcohol-based hand sanitizer with at least 60% alcohol.  . If coughing or sneezing, cover your mouth and nose by coughing or sneezing into the elbow areas of your shirt or coat, into a tissue or into your sleeve (not your hands). . Avoid shaking hands with others and consider head nods or verbal greetings only. . Avoid touching your eyes, nose, or mouth with unwashed hands.  . Avoid close contact with people who are sick. . Avoid places or events with large numbers of people in one location, like concerts or sporting events. . Carefully consider travel plans you have or are making. . If you are planning any travel outside or inside the [place], visit the CDC's Travelers' Health webpage for the latest health notices. . If you have some symptoms but not all symptoms, continue to monitor at home and seek medical attention if your symptoms worsen. . If you are having a medical emergency, call 911.   ADDITIONAL HEALTHCARE OPTIONS FOR PATIENTS  [facility] / e-Visit: [email or web address]         [facility]: [phone]  [place]   [facility]: [phone]                   [facility]: [phone]    [facility] Discharge Instructions for Patients Receiving Chemotherapy  Today you received the following chemotherapy agents Velcade  To help prevent nausea and vomiting after your treatment, we encourage you to take your nausea medication as directed   If you develop nausea and vomiting that is not controlled by your nausea medication, call the clinic.   BELOW ARE  SYMPTOMS THAT SHOULD BE REPORTED IMMEDIATELY:  *FEVER GREATER THAN 100.5 F  *CHILLS WITH OR WITHOUT FEVER  NAUSEA AND VOMITING THAT IS NOT CONTROLLED WITH YOUR NAUSEA MEDICATION  *UNUSUAL SHORTNESS OF BREATH  *UNUSUAL BRUISING OR BLEEDING  TENDERNESS IN MOUTH AND THROAT WITH OR WITHOUT PRESENCE OF ULCERS  *URINARY PROBLEMS  *BOWEL PROBLEMS  UNUSUAL RASH Items with * indicate a potential emergency and should be followed up as soon as possible.  Feel free to call the clinic should you have any questions or concerns. The clinic phone number is [phone].  Please show the CHEMO ALERT CARD at check-in to the Emergency Department and triage nurse.   

## 2018-11-26 LAB — MULTIPLE MYELOMA PANEL, SERUM
Albumin SerPl Elph-Mcnc: 3.6 g/dL (ref 2.9–4.4)
Albumin/Glob SerPl: 1 (ref 0.7–1.7)
Alpha 1: 0.2 g/dL (ref 0.0–0.4)
Alpha2 Glob SerPl Elph-Mcnc: 0.9 g/dL (ref 0.4–1.0)
B-Globulin SerPl Elph-Mcnc: 2.7 g/dL — ABNORMAL HIGH (ref 0.7–1.3)
Gamma Glob SerPl Elph-Mcnc: 0.1 g/dL — ABNORMAL LOW (ref 0.4–1.8)
Globulin, Total: 3.9 g/dL (ref 2.2–3.9)
IgA: 18 mg/dL — ABNORMAL LOW (ref 90–386)
IgG (Immunoglobin G), Serum: 1906 mg/dL — ABNORMAL HIGH (ref 603–1613)
IgM (Immunoglobulin M), Srm: 29 mg/dL (ref 20–172)
M Protein SerPl Elph-Mcnc: 2 g/dL — ABNORMAL HIGH
Total Protein ELP: 7.5 g/dL (ref 6.0–8.5)

## 2018-11-26 LAB — KAPPA/LAMBDA LIGHT CHAINS
Kappa free light chain: 76.3 mg/L — ABNORMAL HIGH (ref 3.3–19.4)
Kappa, lambda light chain ratio: 18.61 — ABNORMAL HIGH (ref 0.26–1.65)
Lambda free light chains: 4.1 mg/L — ABNORMAL LOW (ref 5.7–26.3)

## 2018-11-29 ENCOUNTER — Inpatient Hospital Stay: Payer: 59 | Admitting: Hematology

## 2018-11-29 ENCOUNTER — Inpatient Hospital Stay: Payer: 59 | Attending: Hematology

## 2018-11-29 DIAGNOSIS — C9000 Multiple myeloma not having achieved remission: Secondary | ICD-10-CM | POA: Insufficient documentation

## 2018-11-29 DIAGNOSIS — Z9221 Personal history of antineoplastic chemotherapy: Secondary | ICD-10-CM | POA: Insufficient documentation

## 2018-11-29 DIAGNOSIS — Z79899 Other long term (current) drug therapy: Secondary | ICD-10-CM | POA: Insufficient documentation

## 2018-11-29 DIAGNOSIS — Z7984 Long term (current) use of oral hypoglycemic drugs: Secondary | ICD-10-CM | POA: Insufficient documentation

## 2018-11-29 DIAGNOSIS — Z5111 Encounter for antineoplastic chemotherapy: Secondary | ICD-10-CM | POA: Insufficient documentation

## 2018-11-29 DIAGNOSIS — Z7982 Long term (current) use of aspirin: Secondary | ICD-10-CM | POA: Insufficient documentation

## 2018-11-29 DIAGNOSIS — R634 Abnormal weight loss: Secondary | ICD-10-CM | POA: Insufficient documentation

## 2018-11-29 DIAGNOSIS — R109 Unspecified abdominal pain: Secondary | ICD-10-CM | POA: Insufficient documentation

## 2018-11-29 DIAGNOSIS — E1165 Type 2 diabetes mellitus with hyperglycemia: Secondary | ICD-10-CM | POA: Insufficient documentation

## 2018-11-29 DIAGNOSIS — R61 Generalized hyperhidrosis: Secondary | ICD-10-CM | POA: Insufficient documentation

## 2018-11-29 DIAGNOSIS — D630 Anemia in neoplastic disease: Secondary | ICD-10-CM | POA: Insufficient documentation

## 2018-11-29 DIAGNOSIS — M545 Low back pain: Secondary | ICD-10-CM | POA: Insufficient documentation

## 2018-11-29 DIAGNOSIS — I10 Essential (primary) hypertension: Secondary | ICD-10-CM | POA: Insufficient documentation

## 2018-11-29 DIAGNOSIS — E860 Dehydration: Secondary | ICD-10-CM | POA: Insufficient documentation

## 2018-11-29 DIAGNOSIS — D509 Iron deficiency anemia, unspecified: Secondary | ICD-10-CM | POA: Insufficient documentation

## 2018-11-29 DIAGNOSIS — K5900 Constipation, unspecified: Secondary | ICD-10-CM | POA: Insufficient documentation

## 2018-11-30 ENCOUNTER — Inpatient Hospital Stay: Payer: 59

## 2018-11-30 ENCOUNTER — Other Ambulatory Visit: Payer: Self-pay

## 2018-11-30 ENCOUNTER — Other Ambulatory Visit: Payer: Self-pay | Admitting: Hematology

## 2018-11-30 ENCOUNTER — Emergency Department (HOSPITAL_COMMUNITY): Payer: 59

## 2018-11-30 ENCOUNTER — Inpatient Hospital Stay (HOSPITAL_BASED_OUTPATIENT_CLINIC_OR_DEPARTMENT_OTHER): Payer: 59 | Admitting: Hematology

## 2018-11-30 ENCOUNTER — Emergency Department (HOSPITAL_COMMUNITY)
Admission: EM | Admit: 2018-11-30 | Discharge: 2018-11-30 | Disposition: A | Discharge status: Home / Self Care | Payer: 59 | Attending: Emergency Medicine | Admitting: Emergency Medicine

## 2018-11-30 ENCOUNTER — Encounter (HOSPITAL_COMMUNITY): Payer: Self-pay

## 2018-11-30 ENCOUNTER — Telehealth: Payer: Self-pay

## 2018-11-30 DIAGNOSIS — Z79899 Other long term (current) drug therapy: Secondary | ICD-10-CM | POA: Insufficient documentation

## 2018-11-30 DIAGNOSIS — Z7982 Long term (current) use of aspirin: Secondary | ICD-10-CM | POA: Insufficient documentation

## 2018-11-30 DIAGNOSIS — R5383 Other fatigue: Secondary | ICD-10-CM | POA: Insufficient documentation

## 2018-11-30 DIAGNOSIS — E119 Type 2 diabetes mellitus without complications: Secondary | ICD-10-CM | POA: Insufficient documentation

## 2018-11-30 DIAGNOSIS — R000 Tachycardia, unspecified: Secondary | ICD-10-CM | POA: Diagnosis not present

## 2018-11-30 DIAGNOSIS — I10 Essential (primary) hypertension: Secondary | ICD-10-CM | POA: Diagnosis not present

## 2018-11-30 DIAGNOSIS — C9000 Multiple myeloma not having achieved remission: Secondary | ICD-10-CM

## 2018-11-30 DIAGNOSIS — Z794 Long term (current) use of insulin: Secondary | ICD-10-CM | POA: Insufficient documentation

## 2018-11-30 HISTORY — DX: Malignant (primary) neoplasm, unspecified: C80.1

## 2018-11-30 LAB — CMP (CANCER CENTER ONLY)
ALT: 9 U/L (ref 0–44)
AST: 9 U/L — ABNORMAL LOW (ref 15–41)
Albumin: 3.7 g/dL (ref 3.5–5.0)
Alkaline Phosphatase: 86 U/L (ref 38–126)
Anion gap: 12 (ref 5–15)
BUN: 9 mg/dL (ref 6–20)
CO2: 20 mmol/L — ABNORMAL LOW (ref 22–32)
Calcium: 9 mg/dL (ref 8.9–10.3)
Chloride: 107 mmol/L (ref 98–111)
Creatinine: 0.85 mg/dL (ref 0.61–1.24)
GFR, Est AFR Am: >60 mL/min (ref >60)
GFR, Estimated: >60 mL/min (ref >60)
Glucose, Bld: 227 mg/dL — ABNORMAL HIGH (ref 70–99)
Potassium: 3.6 mmol/L (ref 3.5–5.1)
Sodium: 139 mmol/L (ref 135–145)
Total Bilirubin: 0.3 mg/dL (ref 0.3–1.2)
Total Protein: 7.7 g/dL (ref 6.5–8.1)

## 2018-11-30 LAB — CBC WITH DIFFERENTIAL (CANCER CENTER ONLY)
Abs Immature Granulocytes: 0.02 10*3/uL (ref 0.00–0.07)
Basophils Absolute: 0 10*3/uL (ref 0.0–0.1)
Basophils Relative: 0 %
Eosinophils Absolute: 0.2 10*3/uL (ref 0.0–0.5)
Eosinophils Relative: 2 %
HCT: 31.9 % — ABNORMAL LOW (ref 39.0–52.0)
Hemoglobin: 10.7 g/dL — ABNORMAL LOW (ref 13.0–17.0)
Immature Granulocytes: 0 %
Lymphocytes Relative: 64 %
Lymphs Abs: 6.4 10*3/uL — ABNORMAL HIGH (ref 0.7–4.0)
MCH: 31 pg (ref 26.0–34.0)
MCHC: 33.5 g/dL (ref 30.0–36.0)
MCV: 92.5 fL (ref 80.0–100.0)
Monocytes Absolute: 0.7 10*3/uL (ref 0.1–1.0)
Monocytes Relative: 7 %
Neutro Abs: 2.7 10*3/uL (ref 1.7–7.7)
Neutrophils Relative %: 27 %
Platelet Count: 305 10*3/uL (ref 150–400)
RBC: 3.45 MIL/uL — ABNORMAL LOW (ref 4.22–5.81)
RDW: 15.5 % (ref 11.5–15.5)
WBC Count: 10 10*3/uL (ref 4.0–10.5)
nRBC: 0 % (ref 0.0–0.2)

## 2018-11-30 LAB — URINALYSIS, ROUTINE W REFLEX MICROSCOPIC
Bacteria, UA: NONE SEEN
Bilirubin Urine: NEGATIVE
Glucose, UA: >=500 mg/dL — AB
Hgb urine dipstick: NEGATIVE
Ketones, ur: 5 mg/dL — AB
Leukocytes,Ua: NEGATIVE
Nitrite: NEGATIVE
Protein, ur: NEGATIVE mg/dL
Specific Gravity, Urine: 1.021 (ref 1.005–1.030)
pH: 5 (ref 5.0–8.0)

## 2018-11-30 LAB — CBG MONITORING, ED: Glucose-Capillary: 163 mg/dL — ABNORMAL HIGH (ref 70–99)

## 2018-11-30 MED ORDER — SODIUM CHLORIDE 0.9 % IV BOLUS
1000.0000 mL | Freq: Once | INTRAVENOUS | Status: AC
  Administered 2018-11-30: 1000 mL via INTRAVENOUS

## 2018-11-30 MED ORDER — MELATONIN 3 MG PO CAPS: 1.0000 | ORAL_CAPSULE | Freq: Every evening | ORAL | 0 refills | Status: AC | PRN

## 2018-11-30 NOTE — ED Provider Notes (Signed)
[facility] DEPT Provider Note   CSN: [id number] Arrival date & time: 11/30/18  0944    History   Chief Complaint Chief Complaint  Patient presents with   Fatigue   Tachycardia    HPI Edward Todd is a 43 y.o. male with history of multiple myeloma, diabetes mellitus, hypertension presents for evaluation of acute onset, progressively worsening fatigue for 3 days.  He was sent from the cancer center where he was scheduled to have his weekly chemotherapy treatment but was found to be diaphoretic, difficult to arouse, and complaining of nausea.  He had difficulty standing at the time and was found to be tachycardic.  He was sent to the ED for further evaluation.  He tells me that for the last 3 days he has been feeling progressively more and more fatigued and thinks he has been "overdoing it "with activities.  He also notes that he has been constipated and has not had about bowel movement in the last 3 days.  He has been taking Maalox and a stool softener with some relief.  He denies any abdominal pain, vomiting, urinary symptoms, fevers, chest pain, cough, or shortness of breath.  He has been checking his blood sugars at home and they have been well controlled per the patient.  Denies melena.  He has been feeling "woozy" with position changes and ambulation.  No loss of consciousness.  Lab work done at the cancer center today reviewed by me shows no leukocytosis, stable H&H, no metabolic derangements.  He was hyperglycemic with a blood glucose of 227 but no evidence of DKA with a normal anion gap.     The history is provided by the patient.    Past Medical History:  Diagnosis Date   Cancer ([facility])    Diabetes mellitus without complication ([facility])    Hypertension     Patient Active Problem List   Diagnosis Date Noted   Thoracic compression fracture ([facility]) 10/10/2018   Labile blood pressure    Labile blood glucose    AKI (acute kidney injury)  ([facility])    Muscle spasm    Diabetes mellitus type 2 in nonobese ([facility])    Elevated blood pressure reading    Hyponatremia    Diabetes mellitus type 2 in obese ([facility])    Acute on chronic anemia    Malaise    FUO (fever of unknown origin)    Sleep disturbance    HTN (hypertension) 09/05/2018   Type 2 diabetes mellitus without complication ([facility] [facility]) 04/27/7251   Hypophosphatemia 09/05/2018   Constipation 09/05/2018   Anemia of chronic disease 09/05/2018   Multiple myeloma (HCC)    Symptomatic anemia 08/30/2018   Acute bilateral low back pain    Weakness of both lower extremities     History reviewed. No pertinent surgical history.      Home Medications    Prior to Admission medications   Medication Sig Start Date End Date Taking? Authorizing Provider  acetaminophen (TYLENOL) 500 MG tablet Take 500 mg by mouth every 6 (six) hours as needed for mild pain.   Yes [provider]  acyclovir (ZOVIRAX) 400 MG tablet TAKE 1 TABLET BY MOUTH TWICE A DAY Patient taking differently: Take 400 mg by mouth 2 (two) times daily.  11/22/18  Yes Truitt Merle, MD  aspirin 81 MG chewable tablet Chew 1 tablet (81 mg total) by mouth daily. 09/25/18  Yes Angiulli, Lavon Paganini, PA-C  calcium-vitamin D (OSCAL WITH D) 500-200 MG-UNIT tablet  Take 1 tablet by mouth 2 (two) times daily. 09/24/18  Yes Angiulli, Lavon Paganini, PA-C  dexamethasone (DECADRON) 4 MG tablet Take 10 tablets (40 mg total) by mouth once a week. Patient taking differently: Take 40 mg by mouth every Friday.  09/27/18  Yes Truitt Merle, MD  HUMALOG KWIKPEN 100 UNIT/ML KwikPen Inject 0-10 Units into the skin 2 (two) times daily. Per sliding scale, Hold if BS <120 11/14/18  Yes [provider]  metFORMIN (GLUCOPHAGE) 1000 MG tablet TAKE 1 TABLET (1,000 MG TOTAL) BY MOUTH 2 (TWO) TIMES DAILY WITH A MEAL. 11/22/18  Yes Truitt Merle, MD  methocarbamol (ROBAXIN) 750 MG tablet Take 1 tablet (750 mg total) by mouth 3 (three) times  daily. Patient taking differently: Take 750 mg by mouth 3 (three) times daily as needed for muscle spasms.  10/19/18  Yes Truitt Merle, MD  ondansetron (ZOFRAN-ODT) 4 MG disintegrating tablet Take 1 tablet (4 mg total) by mouth every 6 (six) hours as needed for nausea or vomiting. 09/24/18  Yes Angiulli, Lavon Paganini, PA-C  Oxycodone HCl 10 MG TABS Take 0.5-1 tablets (5-10 mg total) by mouth every 6 (six) hours as needed. Patient taking differently: Take 5-10 mg by mouth every 6 (six) hours as needed (severe pain).  11/19/18  Yes Alla Feeling, NP  pantoprazole (PROTONIX) 40 MG tablet TAKE 1 TABLET (40 MG TOTAL) BY MOUTH DAILY AT 6 (SIX) AM. 11/22/18  Yes Truitt Merle, MD  polyethylene glycol (MIRALAX / GLYCOLAX) 17 g packet Take 17 g by mouth daily. Patient taking differently: Take 17 g by mouth daily as needed for mild constipation.  09/25/18  Yes Angiulli, Lavon Paganini, PA-C  REVLIMID 25 MG capsule TAKE 1 CAPSULE BY MOUTH  DAILY FOR 14 DAYS, THEN 7  DAYS OFF Patient taking differently: Take 25 mg by mouth See admin instructions. Take 25 mg by mouth daily for 14 days and then take 7 days off 11/30/18  Yes Truitt Merle, MD  senna-docusate (SENOKOT-S) 8.6-50 MG tablet Take 1 tablet by mouth 2 (two) times daily. Patient taking differently: Take 1 tablet by mouth 2 (two) times daily as needed for mild constipation.  09/25/18  Yes Angiulli, Lavon Paganini, PA-C  acetaminophen (TYLENOL) 325 MG tablet Take 2 tablets (650 mg total) by mouth every 6 (six) hours as needed for mild pain or fever. Patient not taking: Reported on 11/30/2018 09/24/18   Angiulli, Lavon Paganini, PA-C  Melatonin 3 MG CAPS Take 1 capsule (3 mg total) by mouth at bedtime as needed (sleep). 11/30/18   Renita Papa, PA-C    Family History Family History  Problem Relation Age of Onset   Diabetes Maternal Grandmother    Diabetes Paternal Grandfather     Social History Social History   Tobacco Use   Smoking status: Never Smoker   Smokeless tobacco: Never Used   Substance Use Topics   Alcohol use: No    Alcohol/week: 0.0 standard drinks   Drug use: No     Allergies   Shellfish allergy   Review of Systems Review of Systems  Constitutional: Positive for diaphoresis and fatigue. Negative for chills and fever.  Respiratory: Negative for shortness of breath.   Cardiovascular: Negative for chest pain.  Gastrointestinal: Positive for constipation and nausea. Negative for abdominal pain and vomiting.  Neurological: Positive for light-headedness. Negative for syncope, weakness (generalized) and numbness.  All other systems reviewed and are negative.    Physical Exam Updated Vital Signs BP (!) 134/100 (BP  Location: Right Arm)    Pulse (!) 59    Temp 97.9 F (36.6 C) (Oral)    Resp 20    Ht 6' 3" (1.905 m)    Wt 98.9 kg    SpO2 100%    BMI 27.25 kg/m   Physical Exam Vitals signs and nursing note reviewed.  Constitutional:      General: He is not in acute distress.    Appearance: He is well-developed.     Comments: Resting in bed, appears fatigued.  HENT:     Head: Normocephalic and atraumatic.  Eyes:     General:        Right eye: No discharge.        Left eye: No discharge.     Conjunctiva/sclera: Conjunctivae normal.  Neck:     Vascular: No JVD.     Trachea: No tracheal deviation.  Cardiovascular:     Rate and Rhythm: Normal rate and regular rhythm.  Pulmonary:     Effort: Pulmonary effort is normal.     Breath sounds: Normal breath sounds.  Abdominal:     General: Abdomen is protuberant. Bowel sounds are decreased. There is no distension.     Palpations: Abdomen is soft.     Tenderness: There is no abdominal tenderness. There is no guarding or rebound.  Musculoskeletal: Normal range of motion.        General: No tenderness.  Skin:    General: Skin is warm and dry.     Findings: No erythema.  Neurological:     General: No focal deficit present.     Mental Status: He is alert and oriented to person, place, and time.      Cranial Nerves: No cranial nerve deficit.     Motor: No weakness.     Coordination: Coordination normal.     Gait: Gait normal.     Comments: Fluent speech with no evidence of dysarthria or aphasia, no facial droop.  Cranial nerves appear grossly intact.  Moves extremities spontaneously without difficulty.  Psychiatric:        Behavior: Behavior normal.      ED Treatments / Results  Labs (all labs ordered are listed, but only abnormal results are displayed) Labs Reviewed  URINALYSIS, ROUTINE W REFLEX MICROSCOPIC - Abnormal; Notable for the following components:      Result Value   Glucose, UA >=500 (*)    Ketones, ur 5 (*)    All other components within normal limits  CBG MONITORING, ED - Abnormal; Notable for the following components:   Glucose-Capillary 163 (*)    All other components within normal limits    EKG EKG Interpretation  Date/Time:  Friday November 30 2018 10:44:52 EDT Ventricular Rate:  60 PR Interval:    QRS Duration: 97 QT Interval:  434 QTC Calculation: 434 R Axis:   -17 Text Interpretation:  Sinus rhythm Atrial premature complex Left ventricular hypertrophy Confirmed by Virgel Manifold [phone]) on 11/30/2018 2:13:57 PM   Radiology Dg Chest Portable 1 View  Result Date: 11/30/2018 CLINICAL DATA:  Weakness EXAM: PORTABLE CHEST 1 VIEW COMPARISON:  Chest radiograph 09/08/2018 FINDINGS: Shallow inspiration radiograph with crowding of the central bronchovascular markings. The heart appears enlarged, although may be accentuated due to shallow inspiration and AP technique. Mild opacities within the medial lung bases bilaterally are favored to reflect incomplete atelectasis. No evidence of pleural effusion or pneumothorax. No acute bony abnormality. IMPRESSION: The heart appears enlarged, although may be accentuated due to  shallow inspiration and AP technique. Mild opacities within the medial lung bases bilaterally are favored to reflect incomplete atelectasis.  Electronically Signed   By: Kellie Simmering   On: 11/30/2018 11:03    Procedures Procedures (including critical care time)  Medications Ordered in ED Medications  sodium chloride 0.9 % bolus 1,000 mL (0 mLs Intravenous Stopped 11/30/18 1519)     Initial Impression / Assessment and Plan / ED Course  I have reviewed the triage vital signs and the nursing notes.  Pertinent labs & imaging results that were available during my care of the patient were reviewed by me and considered in my medical decision making (see chart for details).        Patient presenting sent from the cancer center for evaluation of fatigue.  Was scheduled for his usual chemo treatment but was found to be diaphoretic, tachycardic on their assessment and sent to the ED for further evaluation.  Prior to my assessment in the ED he was tachycardic but vital signs otherwise stable and he was afebrile.  He had improvement in his heart rate with administration of fluids.  Found to have orthostatic tachycardia in the ED suggestive of dehydration.  Blood work performed at the cancer center reviewed independently by myself show no worsening anemia, no leukocytosis, no metabolic derangements, no renal insufficiency.  He was hyperglycemic but this improved with IV fluids.  Received a total of 2 L IV fluids from the cancer center and here in the ED.  Chest x-ray shows atelectasis and mild cardiomegaly though that could be positional.  He has no complaint of chest pain, shortness of breath, cough, or fevers and I doubt acute infectious process.  Doubt PE, ACS, or MI.  EKG shows no acute ischemic abnormalities.  No evidence of DKA.  On reevaluation the patient is resting comfortably in no apparent distress, reports that he is feeling better.  He is ambulatory with steady gait and good balance in the ED.  He states that he sometimes begins to feel this way when he has not had enough sleep and reports that he has had poor sleep over the last couple  of days due to constipation but this is improving with his home medications.  Will discharge with melatonin to help with sleep.  Recommend close follow-up with his oncologist for reevaluation and possible rescheduling of his chemotherapy session.  Discussed strict ED return precautions. Patient verbalized understanding of and agreement with plan and is safe for discharge home at this time.  Discussed with Dr. Wilson Singer who agrees with assessment and plan at this time. Final Clinical Impressions(s) / ED Diagnoses   Final diagnoses:  Other fatigue    ED Discharge Orders         Ordered    Melatonin 3 MG CAPS  At bedtime PRN     11/30/18 1549           Renita Papa, PA-C 11/30/18 1751    Virgel Manifold, MD 12/01/18 [phone]

## 2018-11-30 NOTE — Progress Notes (Signed)
Edward Todd   [phone] [phone]   Clinic Follow up Note   Patient Care Team: London Pepper, MD as PCP - General (Family Medicine)  Date of Service:  11/30/2018  CHIEF COMPLAINT: F/u of MM, worsening fatigue   SUMMARY OF ONCOLOGIC HISTORY: Oncology History Overview Note  Cancer Staging Multiple myeloma ([place]) Staging form: Plasma Cell Myeloma and Plasma Cell Disorders, AJCC 8th Edition - Clinical stage from 09/04/2018: RISS Stage III (Beta-2-microglobulin (mg/L): 5.5, Albumin (g/dL): 2.6, ISS: Stage III, High-risk cytogenetics: Absent, LDH: Elevated) - Signed by Truitt Merle, MD on 09/26/2018     Multiple myeloma ([place])  08/31/2018 Initial Diagnosis   Multiple myeloma ([place])   09/04/2018 Cancer Staging   Staging form: Plasma Cell Myeloma and Plasma Cell Disorders, AJCC 8th Edition - Clinical stage from 09/04/2018: RISS Stage III (Beta-2-microglobulin (mg/L): 5.5, Albumin (g/dL): 2.6, ISS: Stage III, High-risk cytogenetics: Absent, LDH: Elevated) - Signed by Truitt Merle, MD on 09/26/2018   09/04/2018 Initial Biopsy   Diagnosis 09/04/18 Bone Marrow, Aspirate,Biopsy, and Clot, left posterior iliac crest BONE MARROW: - CELLULAR MARROW WITH INVOLVEMENT BY PLASMA CELL NEOPLASM (>95%) - SEE COMMENT PERIPHERAL BLOOD: - NORMOCYTIC ANEMIA - ROULEAUX FORMATION - SEE COMPLETE BLOOD COUNT   09/05/2018 -  Chemotherapy   Velcade injection and dexamethasone 73m weekly starting 09/05/18   09/14/2018 -  Chemotherapy   Revlimid 229m2 weeks on/1 week off starting 09/14/18    10/19/2018 -  Chemotherapy   The patient had bortezomib SQ (VELCADE) chemo injection 3 mg, 1.3 mg/m2 = 3 mg, Subcutaneous,  Once, 2 of 4 cycles Administration: 3 mg (10/19/2018), 3 mg (10/25/2018), 3 mg (11/01/2018), 3 mg (11/09/2018), 3 mg (11/16/2018), 3 mg (11/23/2018)  for chemotherapy treatment.       CURRENT THERAPY:  1. Weekly velcade injection and dexamethasone 4031mtarting 09/05/18,Revlimid  93m20mly2 weeks onand 1 week off starting 09/14/18.  -increase Velcade injection to twice a week (Fridays/Mondays) for 2 weeks on, 1 week off starting 10/19/18. Will return to weekly Velcade starting 11/09/18.  2. MonthlyZometastarted on 09/05/2018  INTERVAL HISTORY:  Edward Todd for a routine follow up and treatment. He presents to the clinic alone. In infusion room his nurse notes he is very lethargic, nauseous and was diaphoretic. His HR was 106 today. He is arousable. He took Zofran, dexa and 5mg 8mcodone this morning before coming. His wife dropped him off and he came to infusion by wheelchair. I called his wife to be included in the visit today.  He notes he is very tired and missed appointment yesterday. He was dizzy upon standing last night and this morning. He had no fever. His CC has been abdominal pain from constipation but has finally had a BM. He ate dinner and ensure/Glucerna. This morning he had water only with meds, no food. His wife notes his appetite has decreased lately.   REVIEW OF SYSTEMS:   Constitutional: Denies fevers, chills or abnormal weight loss (+) very lethargic (+) diaphoretic  Eyes: Denies blurriness of vision Ears, nose, mouth, throat, and face: Denies mucositis or sore throat Respiratory: Denies cough, dyspnea or wheezes Cardiovascular: Denies palpitation, chest discomfort or lower extremity swelling Gastrointestinal:  Denies heartburn or change in bowel habits (+) nauseous  Skin: Denies abnormal skin rashes Lymphatics: Denies new lymphadenopathy or easy bruising Neurological:Denies numbness, tingling or new weaknesses Behavioral/Psych: Mood is stable, no new changes  All other systems were reviewed with the patient and are negative.  MEDICAL HISTORY:  Past Medical History:  Diagnosis Date  . Diabetes mellitus without complication ([place])   . Hypertension     SURGICAL HISTORY: No past surgical history on file.  I have  reviewed the social history and family history with the patient and they are unchanged from previous note.  ALLERGIES:  is allergic to shellfish allergy.  MEDICATIONS:  Current Outpatient Medications  Medication Sig Dispense Refill  . acetaminophen (TYLENOL) 325 MG tablet Take 2 tablets (650 mg total) by mouth every 6 (six) hours as needed for mild pain or fever.    Marland Kitchen acyclovir (ZOVIRAX) 400 MG tablet TAKE 1 TABLET BY MOUTH TWICE A DAY 60 tablet 0  . aspirin 81 MG chewable tablet Chew 1 tablet (81 mg total) by mouth daily.    . calcium-vitamin D (OSCAL WITH D) 500-200 MG-UNIT tablet Take 1 tablet by mouth 2 (two) times daily. 60 tablet 1  . dexamethasone (DECADRON) 4 MG tablet Take 10 tablets (40 mg total) by mouth once a week. 40 tablet 2  . metFORMIN (GLUCOPHAGE) 1000 MG tablet TAKE 1 TABLET (1,000 MG TOTAL) BY MOUTH 2 (TWO) TIMES DAILY WITH A MEAL. 60 tablet 0  . methocarbamol (ROBAXIN) 750 MG tablet Take 1 tablet (750 mg total) by mouth 3 (three) times daily. 90 tablet 2  . ondansetron (ZOFRAN-ODT) 4 MG disintegrating tablet Take 1 tablet (4 mg total) by mouth every 6 (six) hours as needed for nausea or vomiting. 20 tablet 0  . Oxycodone HCl 10 MG TABS Take 0.5-1 tablets (5-10 mg total) by mouth every 6 (six) hours as needed. 75 tablet 0  . pantoprazole (PROTONIX) 40 MG tablet TAKE 1 TABLET (40 MG TOTAL) BY MOUTH DAILY AT 6 (SIX) AM. 30 tablet 0  . polyethylene glycol (MIRALAX / GLYCOLAX) 17 g packet Take 17 g by mouth daily. 14 each 0  . REVLIMID 25 MG capsule TAKE 1 CAPSULE BY MOUTH  DAILY FOR 14 DAYS, THEN 7  DAYS OFF 14 capsule 0  . senna-docusate (SENOKOT-S) 8.6-50 MG tablet Take 1 tablet by mouth 2 (two) times daily.     No current facility-administered medications for this visit.     PHYSICAL EXAMINATION: ECOG PERFORMANCE STATUS: 3 - Symptomatic, >50% confined to bed  Vitals with BMI 11/30/2018  Height   Weight   BMI   Systolic 997  Diastolic 96  Pulse 741  Respirations 20     GENERAL:alert, no distress and comfortable SKIN: skin color, texture, turgor are normal, no rashes or significant lesions EYES: normal, Conjunctiva are pink and non-injected, sclera clear  NECK: supple, thyroid normal size, non-tender, without nodularity LYMPH:  no palpable lymphadenopathy in the cervical, axillary  LUNGS: clear to auscultation and percussion with normal breathing effort HEART: no murmurs and no lower extremity edema (+) Tachycardia  ABDOMEN:abdomen soft, non-tender and normal bowel sounds Musculoskeletal:no cyanosis of digits and no clubbing  NEURO: alert & oriented x 3 with fluent speech, no focal motor/sensory deficits  LABORATORY DATA:  I have reviewed the data as listed CBC Latest Ref Rng & Units 11/30/2018 11/23/2018 11/16/2018  WBC 4.0 - 10.5 K/uL 10.0 4.7 5.1  Hemoglobin 13.0 - 17.0 g/dL 10.7(L) 10.4(L) 9.7(L)  Hematocrit 39.0 - 52.0 % 31.9(L) 31.0(L) 28.6(L)  Platelets 150 - 400 K/uL 305 213 201     CMP Latest Ref Rng & Units 11/30/2018 11/23/2018 11/16/2018  Glucose 70 - 99 mg/dL 227(H) 122(H) 128(H)  BUN 6 - 20 mg/dL _0 Creatinine 0.61 -  1.24 mg/dL 0.85 0.83 0.82  Sodium 135 - 145 mmol/L 139 138 138  Potassium 3.5 - 5.1 mmol/L 3.6 3.5 3.8  Chloride 98 - 111 mmol/L 107 105 107  CO2 22 - 32 mmol/L 20(L) 23 22  Calcium 8.9 - 10.3 mg/dL 9.0 8.5(L) 8.6(L)  Total Protein 6.5 - 8.1 g/dL 7.7 8.1 7.9  Total Bilirubin 0.3 - 1.2 mg/dL 0.3 0.4 0.4  Alkaline Phos 38 - 126 U/L 86 88 87  AST 15 - 41 U/L 9(L) 8(L) 9(L)  ALT 0 - 44 U/L _0 RADIOGRAPHIC STUDIES: I have personally reviewed the radiological images as listed and agreed with the findings in the report. No results found.   ASSESSMENT & PLAN:  Edward Todd is a 43 y.o. male with   1. Diaphoretic, Nauseous, Lethargy, constipation, and worsening fatigue  -He has been constipated for a few days and this has effected his sleep. He did not have a  BM for 3 days until last night  -He had  dizziness upon standing last night and this morning. Orthostatic BP dropped mildly systolically. He is tachycardic with HR 106 today. Will give IV Fluids.  -although he has no CP or significant dyspnea, afebrile, lab stable, he appears acutely ill, and I recommend [facility] ED evaluation   2. 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 Work upconfirmed multiple myeloma, unfortunately he has multiple compression fracture of thoracic and lumbar spine. -I started him oninduction chemo with IHW:TUUEKC Velcade injections with Dexamethasonestartedon 09/05/18 and oral Revlimid on 09/14/18. -His cytogeneticsand FISHresults showed trisomy 11, FISH panel otherwise negative, this is considered a standard risk.  -He is a candidate for stem cell transplant. He will have consult with [facility] for transplant consultation in 11/2018. Given his standard risk disease, he may respond well to induction chemotherapy, bone marrowtransplant could bedeferred if he has good response to induction chemo. -Ipreviouslyrecommend PET scan for base line, but it was denied by his insurance -He did not tolerate 2 week on/1 week off Velcade well and I changed it back to weekly Velcade on 11/09/18.  -He will continue oral Dexa 48m on Fridaysweekly and Revlimid, contineu Velcade weekly and Zometa monthly.  -Labs reviewed, CBC and CMP WNL except Hg 10.7, lymphocyte ct 6.4, BG 227. Due to his acute illness, will hold chemo today and send him to ED for evaluation   3. Anemia, secondary to MM -He required blood transfusion 09/12/18. -On Oral iron -Labs reviewed, Hg at10.7 today (8/7//20) -Continue to monitor.   4. Severe low back pain  -secondary to thoracic and lumbar compression fractures,lumbar spine stenosisseen on 08/31/18 bone survey.  -He has completed rehab and currently doing PT  -He is on Robaxin 75107mand oxycodone  1084m4 times a dayas needed which is mostly controlling his pain.  -I discussed to reduce longterm use of narcotics I recommend he use half tablet if pain is improving. -His peripheral strength and ambulation continues to slowly improve. He will continue PT  -He will continuestool softeners as needed to prevent constipation from pain meds.  -Given he needs pain medication less, on 11/09/18 he will decrease oxycodone to 5mg46mhr as needed. He can continue Tylenol as needed in between.   5. DM, Type II, hyperglycemia  -On Metformin BID -I againencouraged him to reduce carbohydrates, juice, soft drinks in diet.  -Will monitor on dexamethasone. -BG [id number]//20).I  previously referred him to endocrinologist to be better managed. He is agreeable -He still has not seen a physician to manage his DM. I strongly encourage he see one very soon.  -I may have to reduce his dexa dose due to his uncontrolled hyperglycemia   6. Financial support -I offered him our cone resources that are available to him. -He does having insurance and he does plan to apply for disability.  -I will set up consult with financial advocate.    PLAN: -hold Velcade treatment today -Will send him to to [facility] ED for further evaluation of his lethargy, diaphoresis and worsening fatigue -will start IVF in infusion room before sending him to ED for his dehydration     No problem-specific Assessment & Plan notes found for this encounter.   No orders of the defined types were placed in this encounter.  All questions were answered. The patient knows to call the clinic with any problems, questions or concerns. No barriers to learning was detected. I spent 15 minutes counseling the patient face to face. The total time spent in the appointment was 25 minutes and more than 50% was on counseling and review of test results     Truitt Merle, MD 11/30/2018   I, Joslyn Devon, am acting as scribe for Truitt Merle, MD.    I have reviewed the above documentation for accuracy and completeness, and I agree with the above.

## 2018-11-30 NOTE — Discharge Instructions (Signed)
Drink plenty fluids and get plenty of rest.  Start taking melatonin to help you sleep at night.  Call your oncologist to see if you need to schedule follow-up for your chemo treatment.  They can also change your medications as you are having some side effects.  Return to the emergency department if any concerning signs or symptoms develop such as chest pains, persistent vomiting, loss of consciousness, or fevers.

## 2018-11-30 NOTE — ED Triage Notes (Signed)
Pt from [facility] center, reports lethargy, diaphoresis and tachycardia at appointment. Hx Multiple Myeloma. Last Chemo trtmt last week.

## 2018-11-30 NOTE — Progress Notes (Signed)
Patient assisted to inf room via wheelchair. Patient diaphoretic, diff to arouse, and c/o nausea. Diff standing to transfer to chair. HR 106. Pt stated that he took his Dex, Oxy 5mg , and Zofran, and that he did not sleep good last night. Contacted Dr. Burr Medico to evaluate patient. Received verbal order to start PIV and IVF and to then take patient to the ED for further evaluation and patient will not receive Velcade of Zometa today. Dr. Burr Medico contacted patient spouse and made aware of the situation. Hattie Perch RN transported patient to the ED with PIV intact.

## 2018-11-30 NOTE — Progress Notes (Signed)
Faxed scrpt for Revlimid and Celgene authorization to [place] at [phone] sent to HIM for scan to chart.

## 2018-11-30 NOTE — Telephone Encounter (Signed)
Left voice message for patient that he would need to come in earlier than 9:00 needs lab work, needs to be here by 8:30.

## 2018-11-30 NOTE — ED Notes (Signed)
Sign pad not working in room. Pt verbalized all discharge instructions.

## 2018-11-30 NOTE — Progress Notes (Signed)
Pt transported to ED via [facility] with belongings.  Bedside report given to [place] in ED, pt placed in room 9.

## 2018-11-30 NOTE — ED Notes (Signed)
Pt made aware urine specimen was needed, and pt stated that he would be able to urinate

## 2018-12-01 ENCOUNTER — Encounter: Payer: Self-pay | Admitting: Hematology

## 2018-12-03 ENCOUNTER — Encounter: Payer: Self-pay | Admitting: Hematology

## 2018-12-03 ENCOUNTER — Telehealth: Payer: Self-pay

## 2018-12-03 ENCOUNTER — Telehealth: Payer: Self-pay | Admitting: Emergency Medicine

## 2018-12-03 NOTE — Telephone Encounter (Signed)
Left voice message for patient per Dr. Burr Medico.  Asked that he call back and let me know if he wants to come in today or tomorrow for Velcade injection or just keep his scheduled appointment for this Friday?   Also instructed him to follow up with PCP or endo regarding his DM management.  Instructed him to decrease his Dexamethasone from 40 mg weekly to 20 mg weekly.

## 2018-12-03 NOTE — Telephone Encounter (Signed)
-----   Message from Truitt Merle, MD sent at 12/01/2018 11:23 PM EDT ----- Edward Todd,  He was discharged from ED on Friday. Please f/u on Monday to see if he is feeling better, if he wants to return on Monday or Tuesday for Velcade injection, or wants to return on Friday as it's scheduled. I think his hyperglycemia played a role for his illness last Friday, I previously referred him to endocrinology, but I don't think he saw them. Please encourage him to f/u with PCP or endo for his DM management, and I recommend him to reduce Dexamethasone from 40mg  to 20mg  weekly.  Thanks   Krista Blue

## 2018-12-03 NOTE — Telephone Encounter (Signed)
Spoke with patient regarding his upcoming lab/injection appts on 12/07/2018.  Pt aware of appt dates/times and states he would prefer to keep them unchanged at this time (does not want to move up his appts to earlier this week).  Pt informed to decrease his decadron dose from 40 mg/weekly to 20 mg/weekly d/t nausea per MD Burr Medico.  Pt verbalized understanding.  Pt advised to f/u with his PCP about his recent ED visit related to his blood sugar readings.  Pt states that he has been wearing a continuous monitoring meter for his blood sugars that was given to him by his PCP who he will be following up with as previously scheduled on 12/11/2018 to discuss the results.  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  However after that date she will require medical documentation of the patient's condition in order to continue working from home, as she is concerned about Worth's immunocompromised status in regards to possible Covid-19 exposure via her work environment.  Discussed a diagnostic letter with pt/his wife to be written by MD Burr Medico, both verbalized understanding.  Desk RN Malachy Mood and MD Burr Medico made aware.

## 2018-12-03 NOTE — Telephone Encounter (Signed)
Letter is done, he can pick up when he returns this Friday.   Truitt Merle MD

## 2018-12-07 ENCOUNTER — Telehealth: Payer: Self-pay | Admitting: Hematology

## 2018-12-07 ENCOUNTER — Inpatient Hospital Stay: Payer: 59

## 2018-12-07 ENCOUNTER — Other Ambulatory Visit: Payer: Self-pay

## 2018-12-07 VITALS — BP 122/92 | HR 80 | Temp 98.0°F | Resp 17

## 2018-12-07 DIAGNOSIS — M545 Low back pain: Secondary | ICD-10-CM | POA: Diagnosis not present

## 2018-12-07 DIAGNOSIS — D509 Iron deficiency anemia, unspecified: Secondary | ICD-10-CM | POA: Diagnosis not present

## 2018-12-07 DIAGNOSIS — Z7982 Long term (current) use of aspirin: Secondary | ICD-10-CM | POA: Diagnosis not present

## 2018-12-07 DIAGNOSIS — Z9221 Personal history of antineoplastic chemotherapy: Secondary | ICD-10-CM | POA: Diagnosis not present

## 2018-12-07 DIAGNOSIS — I10 Essential (primary) hypertension: Secondary | ICD-10-CM | POA: Diagnosis not present

## 2018-12-07 DIAGNOSIS — C9000 Multiple myeloma not having achieved remission: Secondary | ICD-10-CM | POA: Diagnosis not present

## 2018-12-07 DIAGNOSIS — D630 Anemia in neoplastic disease: Secondary | ICD-10-CM | POA: Diagnosis not present

## 2018-12-07 DIAGNOSIS — R109 Unspecified abdominal pain: Secondary | ICD-10-CM | POA: Diagnosis not present

## 2018-12-07 DIAGNOSIS — E1165 Type 2 diabetes mellitus with hyperglycemia: Secondary | ICD-10-CM | POA: Diagnosis not present

## 2018-12-07 DIAGNOSIS — Z7984 Long term (current) use of oral hypoglycemic drugs: Secondary | ICD-10-CM | POA: Diagnosis not present

## 2018-12-07 DIAGNOSIS — Z5111 Encounter for antineoplastic chemotherapy: Secondary | ICD-10-CM | POA: Diagnosis not present

## 2018-12-07 DIAGNOSIS — Z79899 Other long term (current) drug therapy: Secondary | ICD-10-CM | POA: Diagnosis not present

## 2018-12-07 LAB — CMP (CANCER CENTER ONLY)
ALT: 7 U/L (ref 0–44)
AST: 10 U/L — ABNORMAL LOW (ref 15–41)
Albumin: 3.5 g/dL (ref 3.5–5.0)
Alkaline Phosphatase: 91 U/L (ref 38–126)
Anion gap: 10 (ref 5–15)
BUN: 6 mg/dL (ref 6–20)
CO2: 22 mmol/L (ref 22–32)
Calcium: 8.7 mg/dL — ABNORMAL LOW (ref 8.9–10.3)
Chloride: 107 mmol/L (ref 98–111)
Creatinine: 0.86 mg/dL (ref 0.61–1.24)
GFR, Est AFR Am: >60 mL/min (ref >60)
GFR, Estimated: >60 mL/min (ref >60)
Glucose, Bld: 112 mg/dL — ABNORMAL HIGH (ref 70–99)
Potassium: 4 mmol/L (ref 3.5–5.1)
Sodium: 139 mmol/L (ref 135–145)
Total Bilirubin: 0.4 mg/dL (ref 0.3–1.2)
Total Protein: 7.6 g/dL (ref 6.5–8.1)

## 2018-12-07 LAB — CBC WITH DIFFERENTIAL (CANCER CENTER ONLY)
Abs Immature Granulocytes: 0 10*3/uL (ref 0.00–0.07)
Basophils Absolute: 0 10*3/uL (ref 0.0–0.1)
Basophils Relative: 1 %
Eosinophils Absolute: 0 10*3/uL (ref 0.0–0.5)
Eosinophils Relative: 1 %
HCT: 31 % — ABNORMAL LOW (ref 39.0–52.0)
Hemoglobin: 10.6 g/dL — ABNORMAL LOW (ref 13.0–17.0)
Immature Granulocytes: 0 %
Lymphocytes Relative: 55 %
Lymphs Abs: 2.5 10*3/uL (ref 0.7–4.0)
MCH: 31.5 pg (ref 26.0–34.0)
MCHC: 34.2 g/dL (ref 30.0–36.0)
MCV: 92.3 fL (ref 80.0–100.0)
Monocytes Absolute: 0.4 10*3/uL (ref 0.1–1.0)
Monocytes Relative: 9 %
Neutro Abs: 1.5 10*3/uL — ABNORMAL LOW (ref 1.7–7.7)
Neutrophils Relative %: 34 %
Platelet Count: 221 10*3/uL (ref 150–400)
RBC: 3.36 MIL/uL — ABNORMAL LOW (ref 4.22–5.81)
RDW: 15 % (ref 11.5–15.5)
WBC Count: 4.4 10*3/uL (ref 4.0–10.5)
nRBC: 0 % (ref 0.0–0.2)

## 2018-12-07 MED ORDER — PROCHLORPERAZINE MALEATE 10 MG PO TABS
10.0000 mg | ORAL_TABLET | Freq: Once | ORAL | Status: AC
  Administered 2018-12-07: 10 mg via ORAL

## 2018-12-07 MED ORDER — BORTEZOMIB CHEMO SQ INJECTION 3.5 MG (2.5MG/ML)
1.3000 mg/m2 | Freq: Once | INTRAMUSCULAR | Status: AC
  Administered 2018-12-07: 3 mg via SUBCUTANEOUS
  Filled 2018-12-07: qty 1.2

## 2018-12-07 MED ORDER — DEXAMETHASONE 4 MG PO TABS
ORAL_TABLET | ORAL | Status: AC
  Filled 2018-12-07: qty 10

## 2018-12-07 MED ORDER — DEXAMETHASONE 4 MG PO TABS: 40.0000 mg | ORAL_TABLET | Freq: Once | ORAL | Status: DC

## 2018-12-07 MED ORDER — PROCHLORPERAZINE MALEATE 10 MG PO TABS
ORAL_TABLET | ORAL | Status: AC
  Filled 2018-12-07: qty 1

## 2018-12-07 MED ORDER — ONDANSETRON HCL 8 MG PO TABS
ORAL_TABLET | ORAL | Status: AC
  Filled 2018-12-07: qty 1

## 2018-12-07 NOTE — Progress Notes (Signed)
CBC and CMET reviewed by MD, ok to treat despite labs 

## 2018-12-07 NOTE — Telephone Encounter (Signed)
Patient stopped by scheduling today after treatment for schedule. Due to treatment weekly added last two dates in care plan. Patient aware any additional lab f/u will be added 9/4 after f/u with YF.

## 2018-12-07 NOTE — Progress Notes (Signed)
No dexamethasone this week per Dr. Burr Medico. She is decreasing his dex dose to 20mg  PO/week in the future. Care plan order and med list updated.   Demetrius Charity, PharmD, [facility] Pharmac[facility] Phone: [phone] 12/07/2018

## 2018-12-08 ENCOUNTER — Other Ambulatory Visit: Payer: Self-pay | Admitting: Hematology

## 2018-12-08 ENCOUNTER — Telehealth: Payer: Self-pay | Admitting: Hematology

## 2018-12-08 MED ORDER — METOCLOPRAMIDE HCL 10 MG PO TABS: 10.0000 mg | ORAL_TABLET | Freq: Three times a day (TID) | ORAL | 0 refills | Status: AC | PRN

## 2018-12-08 MED ORDER — DEXAMETHASONE 4 MG PO TABS: 20.0000 mg | ORAL_TABLET | ORAL | 2 refills | Status: DC

## 2018-12-08 NOTE — Telephone Encounter (Signed)
I saw pt in the infusion room when he came in today for treatment. He still has intermittent nausea, low appetite and fatigue. He was suppose to start Revlimid today but did not feel he can take it due to nausea. He has been following his PCP for DM management and sugar level good all improved. I will hold his dexa and Revlimid for today and one week, and re-evaluate him next week to see if he can start. Will proceed with Velcade injection today. Pt agrees with the plan. I refilled dexa 20mg  weekly, will likely start next Friday, and also called in Reglan as needed for his nausea. I will see him back in a week.  Edward Todd  12/07/2018

## 2018-12-11 ENCOUNTER — Ambulatory Visit (INDEPENDENT_AMBULATORY_CARE_PROVIDER_SITE_OTHER): Payer: No Typology Code available for payment source | Admitting: Orthopaedic Surgery

## 2018-12-11 ENCOUNTER — Ambulatory Visit: Payer: 59 | Admitting: Orthopaedic Surgery

## 2018-12-11 ENCOUNTER — Encounter: Payer: Self-pay | Admitting: Orthopaedic Surgery

## 2018-12-11 VITALS — BP 135/95 | HR 95 | Ht 75.0 in | Wt 218.0 lb

## 2018-12-11 DIAGNOSIS — C9000 Multiple myeloma not having achieved remission: Secondary | ICD-10-CM

## 2018-12-11 DIAGNOSIS — S22000D Wedge compression fracture of unspecified thoracic vertebra, subsequent encounter for fracture with routine healing: Secondary | ICD-10-CM

## 2018-12-11 NOTE — Progress Notes (Signed)
Office Visit Note   Patient: Edward Todd           Date of Birth: Dec 01, 1975           MRN: [id number] Visit Date: 12/11/2018              Requested by: London Pepper, MD [address],  [place] [address] PCP: London Pepper, MD   Assessment & Plan: Visit Diagnoses:  1. Compression fracture of thoracic vertebra with routine healing, unspecified thoracic vertebral level, subsequent encounter   2. Multiple myeloma not having achieved remission ([place])     Plan: I can follow patient up as an as-needed basis.  He is gotten relief with this treatment for multiple myeloma and has clinical healing of his compression fractures thoracic spine.  He can follow-up with me on an as-needed basis.  Follow-Up Instructions: Return if symptoms worsen or fail to improve.   Orders:  No orders of the defined types were placed in this encounter.  No orders of the defined types were placed in this encounter.     Procedures: No procedures performed   Clinical Data: No additional findings.   Subjective: Chief Complaint  Patient presents with  . Spine - Follow-up    HPI 43 year old male seen for follow-up 31-monthrecheck for thoracic compression fractures with multiple myeloma.  He is undergoing treatment with Revlimid.  He states therapy is going well he is amatory with a cane and is gotten significant improvement in his pain.  Mobility and ambulation is significantly improved and he is having milder discomfort and is cut back on his oxycodone from 10 mg 3 tablets a day to 1-1/2 tablets usually per day.  Review of Systems 14 point system update unchanged from office visit 10/10/2018 other than as mentioned in HPI.   Objective: Vital Signs: BP (!) 135/95   Pulse 95   Ht '6\' 3"'  (1.905 m)   Wt 218 lb (98.9 kg)   BMI 27.25 kg/m   Physical Exam Constitutional:      Appearance: He is well-developed.  HENT:     Head: Normocephalic and atraumatic.  Eyes:   Pupils: Pupils are equal, round, and reactive to light.  Neck:     Thyroid: No thyromegaly.     Trachea: No tracheal deviation.  Cardiovascular:     Rate and Rhythm: Normal rate.  Pulmonary:     Effort: Pulmonary effort is normal.     Breath sounds: No wheezing.  Abdominal:     General: Bowel sounds are normal.     Palpations: Abdomen is soft.  Skin:    General: Skin is warm and dry.     Capillary Refill: Capillary refill takes less than 2 seconds.  Neurological:     Mental Status: He is alert and oriented to person, place, and time.  Psychiatric:        Behavior: Behavior normal.        Thought Content: Thought content normal.        Judgment: Judgment normal.     Ortho Exam previously patient was in a wheelchair due to severe pain and fatigue he is now ambulating up and down the hall with a cane.  Leg strength is improved mobility sitting to standing is significantly improved.  Specialty Comments:  No specialty comments available.  Imaging: No results found.   PMFS History: Patient Active Problem List   Diagnosis Date Noted  . Thoracic compression fracture ([place] 10/10/2018  .  Labile blood pressure   . Labile blood glucose   . AKI (acute kidney injury) ([place])   . Muscle spasm   . Diabetes mellitus type 2 in nonobese ([facility])   . Elevated blood pressure reading   . Hyponatremia   . Diabetes mellitus type 2 in obese ([place])   . Acute on chronic anemia   . Malaise   . FUO (fever of unknown origin)   . Sleep disturbance   . HTN (hypertension) 09/05/2018  . Type 2 diabetes mellitus without complication ([place]) 68/86/4847  . Hypophosphatemia 09/05/2018  . Constipation 09/05/2018  . Anemia of chronic disease 09/05/2018  . Multiple myeloma ([place])   . Symptomatic anemia 08/30/2018  . Acute bilateral low back pain   . Weakness of both lower extremities    Past Medical History:  Diagnosis Date  . Cancer ([place])   . Diabetes mellitus without complication ([place])   .  Hypertension     Family History  Problem Relation Age of Onset  . Diabetes Maternal Grandmother   . Diabetes Paternal Grandfather     No past surgical history on file. Social History   Occupational History  . Not on file  Tobacco Use  . Smoking status: Never Smoker  . Smokeless tobacco: Never Used  Substance and Sexual Activity  . Alcohol use: No    Alcohol/week: 0.0 standard drinks  . Drug use: No  . Sexual activity: Yes

## 2018-12-13 NOTE — Progress Notes (Signed)
[place]   [phone]) [phone] [phone]   Clinic Follow up Note   Patient Care Team: London Pepper, MD as PCP - General (Family Medicine)  Date of Service:  12/14/2018  CHIEF COMPLAINT: F/u of MM  SUMMARY OF ONCOLOGIC HISTORY: Oncology History Overview Note  Cancer Staging Multiple myeloma ([place]) Staging form: Plasma Cell Myeloma and Plasma Cell Disorders, AJCC 8th Edition - Clinical stage from 09/04/2018: RISS Stage III (Beta-2-microglobulin (mg/L): 5.5, Albumin (g/dL): 2.6, ISS: Stage III, High-risk cytogenetics: Absent, LDH: Elevated) - Signed by Truitt Merle, MD on 09/26/2018     Multiple myeloma ([place])  08/31/2018 Initial Diagnosis   Multiple myeloma ([place])   09/04/2018 Cancer Staging   Staging form: Plasma Cell Myeloma and Plasma Cell Disorders, AJCC 8th Edition - Clinical stage from 09/04/2018: RISS Stage III (Beta-2-microglobulin (mg/L): 5.5, Albumin (g/dL): 2.6, ISS: Stage III, High-risk cytogenetics: Absent, LDH: Elevated) - Signed by Truitt Merle, MD on 09/26/2018   09/04/2018 Initial Biopsy   Diagnosis 09/04/18 Bone Marrow, Aspirate,Biopsy, and Clot, left posterior iliac crest BONE MARROW: - CELLULAR MARROW WITH INVOLVEMENT BY PLASMA CELL NEOPLASM (>95%) - SEE COMMENT PERIPHERAL BLOOD: - NORMOCYTIC ANEMIA - ROULEAUX FORMATION - SEE COMPLETE BLOOD COUNT   09/05/2018 -  Chemotherapy   Velcade injection and dexamethasone '40mg'$  weekly starting 09/05/18   09/14/2018 -  Chemotherapy   Revlimid '25mg'$  2 weeks on/1 week off starting 09/14/18    10/19/2018 -  Chemotherapy   The patient had bortezomib SQ (VELCADE) chemo injection 3 mg, 1.3 mg/m2 = 3 mg, Subcutaneous,  Once, 3 of 4 cycles Administration: 3 mg (10/19/2018), 3 mg (10/25/2018), 3 mg (11/01/2018), 3 mg (11/09/2018), 3 mg (11/16/2018), 3 mg (11/23/2018), 3 mg (12/07/2018)  for chemotherapy treatment.       CURRENT THERAPY:  1. Weekly velcade injection and dexamethasone '40mg'$  starting 09/05/18,Revlimid  '25mg'$ daily2 weeks onand 1 week off starting 09/14/18.  -increase Velcade injection to twice a week (Fridays/Mondays) for 2 weeks on, 1 week off starting 10/19/18. Will return to weekly Velcade starting 11/09/18.  -Dexa stopped 12/14/18 due to hyperglycemia  2. MonthlyZometastarted on 09/05/2018  INTERVAL HISTORY:  Edward Todd is here for a follow up and treatment. He presents to the clinic alone. He notes no more pain or nausea. He notes he is forcing himself to eat. He notes he saw Dr Loanne Drilling for his BG. He has sugar monitor placed on right arm. He is currently on a no carb regimen currently. He is still on Metformin. His sugar has improved off steroids so far.  He feels better overall since being off Revlimid. He notes his back pain is occasional now and only uses Oxycodone half tablet BID as needed. He continues to try to reduce use.    REVIEW OF SYSTEMS:   Constitutional: Denies fevers, chills or abnormal weight loss (+) Lower appetite  Eyes: Denies blurriness of vision Ears, nose, mouth, throat, and face: Denies mucositis or sore throat Respiratory: Denies cough, dyspnea or wheezes Cardiovascular: Denies palpitation, chest discomfort or lower extremity swelling Gastrointestinal:  Denies nausea, heartburn or change in bowel habits Skin: Denies abnormal skin rashes MSK: (+) Occasional back pain  Lymphatics: Denies new lymphadenopathy or easy bruising Neurological:Denies numbness, tingling or new weaknesses Behavioral/Psych: Mood is stable, no new changes  All other systems were reviewed with the patient and are negative.  MEDICAL HISTORY:  Past Medical History:  Diagnosis Date  . Cancer ([place])   . Diabetes mellitus without complication ([place])   .  Hypertension     SURGICAL HISTORY: History reviewed. No pertinent surgical history.  I have reviewed the social history and family history with the patient and they are unchanged from previous note.  ALLERGIES:  is  allergic to shellfish allergy.  MEDICATIONS:  Current Outpatient Medications  Medication Sig Dispense Refill  . acetaminophen (TYLENOL) 325 MG tablet Take 2 tablets (650 mg total) by mouth every 6 (six) hours as needed for mild pain or fever.    Marland Kitchen acetaminophen (TYLENOL) 500 MG tablet Take 500 mg by mouth every 6 (six) hours as needed for mild pain.    Marland Kitchen acyclovir (ZOVIRAX) 400 MG tablet TAKE 1 TABLET BY MOUTH TWICE A DAY (Patient taking differently: Take 400 mg by mouth 2 (two) times daily. ) 60 tablet 0  . aspirin 81 MG chewable tablet Chew 1 tablet (81 mg total) by mouth daily.    . calcium-vitamin D (OSCAL WITH D) 500-200 MG-UNIT tablet Take 1 tablet by mouth 2 (two) times daily. 60 tablet 1  . dexamethasone (DECADRON) 4 MG tablet Take 5 tablets (20 mg total) by mouth every Friday. 20 tablet 2  . HUMALOG KWIKPEN 100 UNIT/ML KwikPen Inject 0-10 Units into the skin 2 (two) times daily. Per sliding scale, Hold if BS <120    . Melatonin 3 MG CAPS Take 1 capsule (3 mg total) by mouth at bedtime as needed (sleep). 30 capsule 0  . metFORMIN (GLUCOPHAGE) 1000 MG tablet TAKE 1 TABLET (1,000 MG TOTAL) BY MOUTH 2 (TWO) TIMES DAILY WITH A MEAL. 60 tablet 0  . methocarbamol (ROBAXIN) 750 MG tablet Take 1 tablet (750 mg total) by mouth 3 (three) times daily. (Patient taking differently: Take 750 mg by mouth 3 (three) times daily as needed for muscle spasms. ) 90 tablet 2  . metoCLOPramide (REGLAN) 10 MG tablet Take 1 tablet (10 mg total) by mouth every 8 (eight) hours as needed for nausea. 20 tablet 0  . ondansetron (ZOFRAN-ODT) 4 MG disintegrating tablet Take 1 tablet (4 mg total) by mouth every 6 (six) hours as needed for nausea or vomiting. 20 tablet 0  . ONETOUCH VERIO test strip USE TO CHECK BLOOD SUGAR 3 TIMES A DAY    . Oxycodone HCl 10 MG TABS Take 0.5-1 tablets (5-10 mg total) by mouth every 6 (six) hours as needed. (Patient taking differently: Take 5-10 mg by mouth every 6 (six) hours as needed  (severe pain). ) 75 tablet 0  . pantoprazole (PROTONIX) 40 MG tablet TAKE 1 TABLET (40 MG TOTAL) BY MOUTH DAILY AT 6 (SIX) AM. 30 tablet 0  . polyethylene glycol (MIRALAX / GLYCOLAX) 17 g packet Take 17 g by mouth daily. (Patient taking differently: Take 17 g by mouth daily as needed for mild constipation. ) 14 each 0  . REVLIMID 25 MG capsule TAKE 1 CAPSULE BY MOUTH  DAILY FOR 14 DAYS, THEN 7  DAYS OFF (Patient taking differently: Take 25 mg by mouth See admin instructions. Take 25 mg by mouth daily for 14 days and then take 7 days off) 14 capsule 0  . senna-docusate (SENOKOT-S) 8.6-50 MG tablet Take 1 tablet by mouth 2 (two) times daily. (Patient taking differently: Take 1 tablet by mouth 2 (two) times daily as needed for mild constipation. )     No current facility-administered medications for this visit.    Facility-Administered Medications Ordered in Other Visits  Medication Dose Route Frequency Provider Last Rate Last Dose  . bortezomib SQ (VELCADE) chemo injection  3 mg  1.3 mg/m2 (Treatment Plan Recorded) Subcutaneous Once Truitt Merle, MD      . Zoledronic Acid (ZOMETA) IVPB 4 mg  4 mg Intravenous Once Truitt Merle, MD 400 mL/hr at 12/14/18 0939 4 mg at 12/14/18 4332    PHYSICAL EXAMINATION: ECOG PERFORMANCE STATUS: 2 - Symptomatic, <50% confined to bed  Vitals with BMI 12/14/2018  Height   Weight   BMI   Systolic 951  Diastolic 99  Pulse 98  Respirations 18    GENERAL:alert, no distress and comfortable SKIN: skin color, texture, turgor are normal, no rashes or significant lesions EYES: normal, Conjunctiva are pink and non-injected, sclera clear  NECK: supple, thyroid normal size, non-tender, without nodularity LYMPH:  no palpable lymphadenopathy in the cervical, axillary  LUNGS: clear to auscultation and percussion with normal breathing effort HEART: regular rate & rhythm and no murmurs and no lower extremity edema ABDOMEN:abdomen soft, non-tender and normal bowel sounds  Musculoskeletal:no cyanosis of digits and no clubbing  NEURO: alert & oriented x 3 with fluent speech, no focal motor/sensory deficits  LABORATORY DATA:  I have reviewed the data as listed CBC Latest Ref Rng & Units 12/14/2018 12/07/2018 11/30/2018  WBC 4.0 - 10.5 K/uL 4.1 4.4 10.0  Hemoglobin 13.0 - 17.0 g/dL 11.3(L) 10.6(L) 10.7(L)  Hematocrit 39.0 - 52.0 % 32.8(L) 31.0(L) 31.9(L)  Platelets 150 - 400 K/uL 190 221 305     CMP Latest Ref Rng & Units 12/14/2018 12/07/2018 11/30/2018  Glucose 70 - 99 mg/dL 121(H) 112(H) 227(H)  BUN 6 - 20 mg/dL '7 6 9  '$ Creatinine 0.61 - 1.24 mg/dL 0.88 0.86 0.85  Sodium 135 - 145 mmol/L 138 139 139  Potassium 3.5 - 5.1 mmol/L 3.7 4.0 3.6  Chloride 98 - 111 mmol/L 107 107 107  CO2 22 - 32 mmol/L 21(L) 22 20(L)  Calcium 8.9 - 10.3 mg/dL 8.8(L) 8.7(L) 9.0  Total Protein 6.5 - 8.1 g/dL 8.2(H) 7.6 7.7  Total Bilirubin 0.3 - 1.2 mg/dL 0.3 0.4 0.3  Alkaline Phos 38 - 126 U/L 105 91 86  AST 15 - 41 U/L 10(L) 10(L) 9(L)  ALT 0 - 44 U/L '7 7 9      '$ RADIOGRAPHIC STUDIES: I have personally reviewed the radiological images as listed and agreed with the findings in the report. No results found.   ASSESSMENT & PLAN:  Edward Todd is a 43 y.o. male with   1. 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 Work upconfirmed multiple myeloma, unfortunately he has multiple compression fracture of thoracic and lumbar spine. -I started him oninduction chemo with OAC:ZYSAYT Velcade injections with Dexamethasonestartedon 09/05/18 and oral Revlimid on 09/14/18. -His cytogeneticsand FISHresults showed trisomy 11, FISH panel otherwise negative, this is considered a standard risk.  -He is a candidate for stem cell transplant. He will have consult with [facility] for transplant consultationin 11/2018. Given his standard risk disease, he may respond well to induction  chemotherapy, bone marrowtransplant could bedeferred if he has good response to induction chemo. -Ipreviouslyrecommend PET scan for base line, but it was denied by his insurance -He did not tolerate 2 week on/1 week off Velcade well and I changed it back to weekly Velcade on 11/09/18.  -He has developed nausea, vomiting, more fatigue lately, was evaluated in ED. his symptoms improved since I held dexamethasone currently 2 weeks ago -I will continue to hold Dexa which has been causing intermittent hyperglycemia,  and possibly related symptoms.  I recommend him to restart Revlimid on 8/24 and monitor for side effects. If nausea and vomiting recurs I discussed switching him to Pomalyst.  -Labs reviewed, CBC and CMP WNL except Hg 11.3, ANC 1.1, BG 121, Ca 8.8, Protein 8.2.  Overall adequate to proceed with Velcade injection today.  -He will see Dr. Norma Fredrickson next week for any additional suggestions and discuss the option of Bone Marrow Transplant.  -We will proceed with Zometa infusion today, and continue every 4 weeks -F/u in 2 weeks    2. Diaphoretic, Nauseous, Lethargy, constipation, and worsening fatigue  -Per 11/30/18 visit he was constipated for 3 days prior and effected his sleep. He also had dizziness upon standing. Orthostatic BP dropped mildly systolically along with tachycardia. -although he has no CP or significant dyspnea, afebrile, lab stable, he appears acutely ill I recommend [facility] ED evaluation on 11/30/18.  -His nausea and abdominal issues resolved off dexa and Revlimid. Will watch for symptom return when he restarts Revlimid on 8/24.    3. Anemia, secondary to MM -He required blood transfusion 09/12/18. -On Oral iron -Labs reviewed, Hg at11.3 today (8/21//20) -Continue to monitor.   4. Severe low back pain  -secondary to thoracic and lumbar compression fractures,lumbar spine stenosisseen on 08/31/18 bone survey.  -He has completed rehab and currently doing PT. -He is on  Robaxin '750mg'$  and oxycodone '10mg'$ 3-4 times a dayas needed which is mostly controlling his pain.  -I discussed to reduce longterm use of narcotics I recommend he use half tablet if pain is improving. -His peripheral strength and ambulationcontinues to slowlyimprove.He will continue PT  -He will continuestool softeners as needed to prevent constipation from pain meds.  -His back pain is occasional, but mostly gone. He notes he continues to reduce his oxycodone. Now on half tablet BID.  -He saw Dr. Lorin Mercy this week, will continue f/u.   5. DM, Type II, hyperglycemia  -On Metformin BID -I againencouraged him to reduce carbohydrates, juice, soft drinks in diet.  -He is being seen by Dr. Loanne Drilling. He has sugar monitor placed on right arm. He is currently on no carb diet and will continue metformin.  -I encouraged him to watch his weight. He can increase protein and calorie to avoid losing too much weight. He understands.  -BG at121today (12/14/18). -I will hold Dexa for now (12/14/18)  6. Financial support -I offered him our [facility] resources that are available to him. -He does having insurance and he does plan to apply for disability. -I will set up consult with financial advocate.   PLAN: -Labs reviewed and adequate to proceed with Velcade today and a Zometa infusion  -Lab and Velcade injection weekly  -Continue to hold Dexa.  -Restart Revlimid 2 weeks on and 1 week off on 8/24 -continue Zometa monthly  -f/u in 2 weeks   No problem-specific Assessment & Plan notes found for this encounter.   No orders of the defined types were placed in this encounter.  All questions were answered. The patient knows to call the clinic with any problems, questions or concerns. No barriers to learning was detected. I spent 20 minutes counseling the patient face to face. The total time spent in the appointment was 25 minutes and more than 50% was on counseling and review of test results      Truitt Merle, MD 12/14/2018   I, Joslyn Devon, am acting as scribe for Truitt Merle, MD.   I have reviewed the above documentation  for accuracy and completeness, and I agree with the above.

## 2018-12-14 ENCOUNTER — Other Ambulatory Visit: Payer: Self-pay

## 2018-12-14 ENCOUNTER — Inpatient Hospital Stay: Payer: 59

## 2018-12-14 ENCOUNTER — Ambulatory Visit: Payer: 59 | Admitting: Internal Medicine

## 2018-12-14 ENCOUNTER — Inpatient Hospital Stay (HOSPITAL_BASED_OUTPATIENT_CLINIC_OR_DEPARTMENT_OTHER): Payer: 59 | Admitting: Hematology

## 2018-12-14 ENCOUNTER — Encounter: Payer: Self-pay | Admitting: Hematology

## 2018-12-14 VITALS — BP 127/99 | HR 98 | Temp 98.9°F | Resp 18

## 2018-12-14 DIAGNOSIS — C9000 Multiple myeloma not having achieved remission: Secondary | ICD-10-CM

## 2018-12-14 DIAGNOSIS — E119 Type 2 diabetes mellitus without complications: Secondary | ICD-10-CM | POA: Diagnosis not present

## 2018-12-14 DIAGNOSIS — I10 Essential (primary) hypertension: Secondary | ICD-10-CM | POA: Diagnosis not present

## 2018-12-14 DIAGNOSIS — Z5111 Encounter for antineoplastic chemotherapy: Secondary | ICD-10-CM | POA: Diagnosis not present

## 2018-12-14 LAB — CBC WITH DIFFERENTIAL (CANCER CENTER ONLY)
Abs Immature Granulocytes: 0 10*3/uL (ref 0.00–0.07)
Basophils Absolute: 0 10*3/uL (ref 0.0–0.1)
Basophils Relative: 1 %
Eosinophils Absolute: 0 10*3/uL (ref 0.0–0.5)
Eosinophils Relative: 1 %
HCT: 32.8 % — ABNORMAL LOW (ref 39.0–52.0)
Hemoglobin: 11.3 g/dL — ABNORMAL LOW (ref 13.0–17.0)
Immature Granulocytes: 0 %
Lymphocytes Relative: 61 %
Lymphs Abs: 2.5 10*3/uL (ref 0.7–4.0)
MCH: 31.3 pg (ref 26.0–34.0)
MCHC: 34.5 g/dL (ref 30.0–36.0)
MCV: 90.9 fL (ref 80.0–100.0)
Monocytes Absolute: 0.4 10*3/uL (ref 0.1–1.0)
Monocytes Relative: 10 %
Neutro Abs: 1.1 10*3/uL — ABNORMAL LOW (ref 1.7–7.7)
Neutrophils Relative %: 27 %
Platelet Count: 190 10*3/uL (ref 150–400)
RBC: 3.61 MIL/uL — ABNORMAL LOW (ref 4.22–5.81)
RDW: 14.9 % (ref 11.5–15.5)
WBC Count: 4.1 10*3/uL (ref 4.0–10.5)
nRBC: 0 % (ref 0.0–0.2)

## 2018-12-14 LAB — CMP (CANCER CENTER ONLY)
ALT: 7 U/L (ref 0–44)
AST: 10 U/L — ABNORMAL LOW (ref 15–41)
Albumin: 3.7 g/dL (ref 3.5–5.0)
Alkaline Phosphatase: 105 U/L (ref 38–126)
Anion gap: 10 (ref 5–15)
BUN: 7 mg/dL (ref 6–20)
CO2: 21 mmol/L — ABNORMAL LOW (ref 22–32)
Calcium: 8.8 mg/dL — ABNORMAL LOW (ref 8.9–10.3)
Chloride: 107 mmol/L (ref 98–111)
Creatinine: 0.88 mg/dL (ref 0.61–1.24)
GFR, Est AFR Am: >60 mL/min (ref >60)
GFR, Estimated: >60 mL/min (ref >60)
Glucose, Bld: 121 mg/dL — ABNORMAL HIGH (ref 70–99)
Potassium: 3.7 mmol/L (ref 3.5–5.1)
Sodium: 138 mmol/L (ref 135–145)
Total Bilirubin: 0.3 mg/dL (ref 0.3–1.2)
Total Protein: 8.2 g/dL — ABNORMAL HIGH (ref 6.5–8.1)

## 2018-12-14 MED ORDER — ZOLEDRONIC ACID 4 MG/100ML IV SOLN
4.0000 mg | Freq: Once | INTRAVENOUS | Status: AC
  Administered 2018-12-14: 10:00:00 4 mg via INTRAVENOUS
  Filled 2018-12-14: qty 100

## 2018-12-14 MED ORDER — BORTEZOMIB CHEMO SQ INJECTION 3.5 MG (2.5MG/ML)
1.3000 mg/m2 | Freq: Once | INTRAMUSCULAR | Status: AC
  Administered 2018-12-14: 3 mg via SUBCUTANEOUS
  Filled 2018-12-14: qty 1.2

## 2018-12-14 MED ORDER — PROCHLORPERAZINE MALEATE 10 MG PO TABS
ORAL_TABLET | ORAL | Status: AC
  Filled 2018-12-14: qty 1

## 2018-12-14 MED ORDER — PROCHLORPERAZINE MALEATE 10 MG PO TABS
10.0000 mg | ORAL_TABLET | Freq: Once | ORAL | Status: AC
  Administered 2018-12-14: 09:00:00 10 mg via ORAL

## 2018-12-14 NOTE — Patient Instructions (Signed)
[facility] Discharge Instructions for Patients Receiving Chemotherapy  Today you received the following chemotherapy agents: Velcade  To help prevent nausea and vomiting after your treatment, we encourage you to take your nausea medication  as prescribed.    If you develop nausea and vomiting that is not controlled by your nausea medication, call the clinic.   BELOW ARE SYMPTOMS THAT SHOULD BE REPORTED IMMEDIATELY:  *FEVER GREATER THAN 100.5 F  *CHILLS WITH OR WITHOUT FEVER  NAUSEA AND VOMITING THAT IS NOT CONTROLLED WITH YOUR NAUSEA MEDICATION  *UNUSUAL SHORTNESS OF BREATH  *UNUSUAL BRUISING OR BLEEDING  TENDERNESS IN MOUTH AND THROAT WITH OR WITHOUT PRESENCE OF ULCERS  *URINARY PROBLEMS  *BOWEL PROBLEMS  UNUSUAL RASH Items with * indicate a potential emergency and should be followed up as soon as possible.  Feel free to call the clinic should you have any questions or concerns. The clinic phone number is [phone].  Please show the CHEMO ALERT CARD at check-in to the Emergency Department and triage nurse.  Zoledronic Acid injection (Hypercalcemia, Oncology) What is this medicine? ZOLEDRONIC ACID (ZOE le dron ik AS id) lowers the amount of calcium loss from bone. It is used to treat too much calcium in your blood from cancer. It is also used to prevent complications of cancer that has spread to the bone. This medicine may be used for other purposes; ask your health care provider or pharmacist if you have questions. COMMON BRAND NAME(S): Zometa What should I tell my health care provider before I take this medicine? They need to know if you have any of these conditions:  aspirin-sensitive asthma  cancer, especially if you are receiving medicines used to treat cancer  dental disease or wear dentures  infection  kidney disease  receiving corticosteroids like dexamethasone or prednisone  an unusual or allergic reaction to zoledronic acid, other  medicines, foods, dyes, or preservatives  pregnant or trying to get pregnant  breast-feeding How should I use this medicine? This medicine is for infusion into a vein. It is given by a health care professional in a hospital or clinic setting. Talk to your pediatrician regarding the use of this medicine in children. Special care may be needed. Overdosage: If you think you have taken too much of this medicine contact a poison control center or emergency room at once. NOTE: This medicine is only for you. Do not share this medicine with others. What if I miss a dose? It is important not to miss your dose. Call your doctor or health care professional if you are unable to keep an appointment. What may interact with this medicine?  certain antibiotics given by injection  NSAIDs, medicines for pain and inflammation, like ibuprofen or naproxen  some diuretics like bumetanide, furosemide  teriparatide  thalidomide This list may not describe all possible interactions. Give your health care provider a list of all the medicines, herbs, non-prescription drugs, or dietary supplements you use. Also tell them if you smoke, drink alcohol, or use illegal drugs. Some items may interact with your medicine. What should I watch for while using this medicine? Visit your doctor or health care professional for regular checkups. It may be some time before you see the benefit from this medicine. Do not stop taking your medicine unless your doctor tells you to. Your doctor may order blood tests or other tests to see how you are doing. Women should inform their doctor if they wish to become pregnant or think they might   There is a potential for serious side effects to an unborn child. Talk to your health care professional or pharmacist for more information. You should make sure that you get enough calcium and vitamin D while you are taking this medicine. Discuss the foods you eat and the vitamins you take  with your health care professional. Some people who take this medicine have severe bone, joint, and/or muscle pain. This medicine may also increase your risk for jaw problems or a broken thigh bone. Tell your doctor right away if you have severe pain in your jaw, bones, joints, or muscles. Tell your doctor if you have any pain that does not go away or that gets worse. Tell your dentist and dental surgeon that you are taking this medicine. You should not have major dental surgery while on this medicine. See your dentist to have a dental exam and fix any dental problems before starting this medicine. Take good care of your teeth while on this medicine. Make sure you see your dentist for regular follow-up appointments. What side effects may I notice from receiving this medicine? Side effects that you should report to your doctor or health care professional as soon as possible:  allergic reactions like skin rash, itching or hives, swelling of the face, lips, or tongue  anxiety, confusion, or depression  breathing problems  changes in vision  eye pain  feeling faint or lightheaded, falls  jaw pain, especially after dental work  mouth sores  muscle cramps, stiffness, or weakness  redness, blistering, peeling or loosening of the skin, including inside the mouth  trouble passing urine or change in the amount of urine Side effects that usually do not require medical attention (report to your doctor or health care professional if they continue or are bothersome):  bone, joint, or muscle pain  constipation  diarrhea  fever  hair loss  irritation at site where injected  loss of appetite  nausea, vomiting  stomach upset  trouble sleeping  trouble swallowing  weak or tired This list may not describe all possible side effects. Call your doctor for medical advice about side effects. You may report side effects to FDA at 1-800-FDA-1088. Where should I keep my medicine? This drug  is given in a hospital or clinic and will not be stored at home. NOTE: This sheet is a summary. It may not cover all possible information. If you have questions about this medicine, talk to your doctor, pharmacist, or health care provider.  2020 Elsevier/Gold Standard (2013-09-07 14:19:39)  

## 2018-12-14 NOTE — Progress Notes (Deleted)
Name: Marek Nabors  MRN/ DOB: [id number], 07/27/1975   Age/ Sex: 43 y.o., male    PCP: London Pepper, MD   Reason for Endocrinology Evaluation: Type {NUMBERS 1 OR 2:522190} Diabetes Mellitus     Date of Initial Endocrinology Visit: 12/14/2018     PATIENT IDENTIFIER: Mr. Diedrich Iacono is a 43 y.o. male with a past medical history of T2DM, HTN and MM. The patient presented for initial endocrinology clinic visit on 12/14/2018 for consultative assistance with his diabetes management.    HPI: Mr. Odam was    Diagnosed with DM in 08/2018 Prior Medications tried/Intolerance: Metformin  Currently checking blood sugars *** x / day,  before breakfast and ***.  Hypoglycemia episodes : ***               Symptoms: ***                 Frequency: ***/  Hemoglobin A1c has ranged from *** in ***, peaking at *** in ***. Patient required assistance for hypoglycemia:  Patient has required hospitalization within the last 1 year from hyper or hypoglycemia:   In terms of diet, the patient ***  Pt was diagnosed with MM in 08/2018. He was started on weekly chemo and dexamethasone.      HOME DIABETES REGIMEN: Metformin 1000 mg BID    Statin: {Yes/No:11203} ACE-I/ARB: {YES/NO:17245} Prior Diabetic Education: {Yes/No:11203}   METER DOWNLOAD SUMMARY: Date range evaluated: *** Fingerstick Blood Glucose Tests = *** Average Number Tests/Day = *** Overall Mean FS Glucose = *** Standard Deviation = ***  BG Ranges: Low = *** High = ***   Hypoglycemic Events/30 Days: BG < 50 = *** Episodes of symptomatic severe hypoglycemia = ***   DIABETIC COMPLICATIONS: Microvascular complications:   ***  Denies: ***  Last eye exam: Completed   Macrovascular complications:   ***  Denies: CAD, PVD, CVA   PAST HISTORY: Past Medical History:  Past Medical History:  Diagnosis Date  . Cancer ([place])   . Diabetes mellitus without complication ([place])   . Hypertension      Past  Surgical History: No past surgical history on file.   Social History:  reports that he has never smoked. He has never used smokeless tobacco. He reports that he does not drink alcohol or use drugs. Family History:  Family History  Problem Relation Age of Onset  . Diabetes Maternal Grandmother   . Diabetes Paternal Grandfather       HOME MEDICATIONS: Allergies as of 12/14/2018      Reactions   Shellfish Allergy Rash      Medication List       Accurate as of December 14, 2018 12:33 PM. If you have any questions, ask your nurse or doctor.        acetaminophen 500 MG tablet Commonly known as: TYLENOL Take 500 mg by mouth every 6 (six) hours as needed for mild pain.   acetaminophen 325 MG tablet Commonly known as: TYLENOL Take 2 tablets (650 mg total) by mouth every 6 (six) hours as needed for mild pain or fever.   acyclovir 400 MG tablet Commonly known as: ZOVIRAX TAKE 1 TABLET BY MOUTH TWICE A DAY   aspirin 81 MG chewable tablet Chew 1 tablet (81 mg total) by mouth daily.   calcium-vitamin D 500-200 MG-UNIT tablet Commonly known as: OSCAL WITH D Take 1 tablet by mouth 2 (two) times daily.   dexamethasone 4 MG tablet Commonly known as: DECADRON Take 5  tablets (20 mg total) by mouth every Friday.   HumaLOG KwikPen 100 UNIT/ML KwikPen Generic drug: insulin lispro Inject 0-10 Units into the skin 2 (two) times daily. Per sliding scale, Hold if BS <120   Melatonin 3 MG Caps Take 1 capsule (3 mg total) by mouth at bedtime as needed (sleep).   metFORMIN 1000 MG tablet Commonly known as: GLUCOPHAGE TAKE 1 TABLET (1,000 MG TOTAL) BY MOUTH 2 (TWO) TIMES DAILY WITH A MEAL.   methocarbamol 750 MG tablet Commonly known as: ROBAXIN Take 1 tablet (750 mg total) by mouth 3 (three) times daily. What changed:   when to take this  reasons to take this   metoCLOPramide 10 MG tablet Commonly known as: REGLAN Take 1 tablet (10 mg total) by mouth every 8 (eight) hours as  needed for nausea.   ondansetron 4 MG disintegrating tablet Commonly known as: ZOFRAN-ODT Take 1 tablet (4 mg total) by mouth every 6 (six) hours as needed for nausea or vomiting.   OneTouch Verio test strip Generic drug: glucose blood USE TO CHECK BLOOD SUGAR 3 TIMES A DAY   Oxycodone HCl 10 MG Tabs Take 0.5-1 tablets (5-10 mg total) by mouth every 6 (six) hours as needed. What changed: reasons to take this   pantoprazole 40 MG tablet Commonly known as: PROTONIX TAKE 1 TABLET (40 MG TOTAL) BY MOUTH DAILY AT 6 (SIX) AM.   polyethylene glycol 17 g packet Commonly known as: MIRALAX / GLYCOLAX Take 17 g by mouth daily. What changed:   when to take this  reasons to take this   Revlimid 25 MG capsule Generic drug: lenalidomide TAKE 1 CAPSULE BY MOUTH  DAILY FOR 14 DAYS, THEN 7  DAYS OFF What changed: See the new instructions.   senna-docusate 8.6-50 MG tablet Commonly known as: Senokot-S Take 1 tablet by mouth 2 (two) times daily. What changed:   when to take this  reasons to take this        ALLERGIES: Allergies  Allergen Reactions  . Shellfish Allergy Rash     REVIEW OF SYSTEMS: A comprehensive ROS was conducted with the patient and is negative except as per HPI and below:  ROS    OBJECTIVE:   VITAL SIGNS: There were no vitals taken for this visit.   PHYSICAL EXAM:  General: Pt appears well and is in NAD  Hydration: Well-hydrated with moist mucous membranes and good skin turgor  HEENT: Head: Unremarkable with good dentition. Oropharynx clear without exudate.  Eyes: External eye exam normal without stare, lid lag or exophthalmos.  EOM intact.  PERRL.  Neck: General: Supple without adenopathy or carotid bruits. Thyroid: Thyroid size normal.  No goiter or nodules appreciated. No thyroid bruit.  Lungs: Clear with good BS bilat with no rales, rhonchi, or wheezes  Heart: RRR with normal S1 and S2 and no gallops; no murmurs; no rub  Abdomen: Normoactive  bowel sounds, soft, nontender, without masses or organomegaly palpable  Extremities:  Lower extremities - No pretibial edema. No lesions.  Skin: Normal texture and temperature to palpation. No rash noted. No Acanthosis nigricans/skin tags. No lipohypertrophy.  Neuro: MS is good with appropriate affect, pt is alert and Ox3    DM foot exam:    DATA REVIEWED:  Lab Results  Component Value Date   HGBA1C 9.4 (H) 09/04/2018   HGBA1C 6.1 02/14/2013   Lab Results  Component Value Date   LDLCALC 152 (H) 10/09/2015   CREATININE 0.88 12/14/2018   No  results found for: [facility]  Lab Results  Component Value Date   CHOL 219 (H) 10/09/2015   HDL 44 10/09/2015   LDLCALC 152 (H) 10/09/2015   TRIG 114 10/09/2015   CHOLHDL 5.0 10/09/2015        ASSESSMENT / PLAN / RECOMMENDATIONS:   1) Type *** Diabetes Mellitus, ***controlled, With*** complications - Most recent A1c of *** %. Goal A1c < *** %.  ***  Plan: GENERAL:  ***  MEDICATIONS:  ***  EDUCATION / INSTRUCTIONS:  BG monitoring instructions: Patient is instructed to check his blood sugars *** times a day, ***.  Call [facility] if: BG persistently < 70 or > 300. . I reviewed the Rule of 15 for the treatment of hypoglycemia in detail with the patient. Literature supplied.   2) Diabetic complications:   Eye: Does *** have known diabetic retinopathy.   Neuro/ Feet: Does *** have known diabetic peripheral neuropathy.  Renal: Patient does *** have known baseline CKD. He is *** on an ACEI/ARB at present.Check urine albumin/creatinine ratio yearly starting at time of diagnosis. If albuminuria is positive, treatment is geared toward better glucose, blood pressure control and use of ACE inhibitors or ARBs. Monitor electrolytes and creatinine once to twice yearly.   3) Lipids: Patient is *** on a statin.    4) Hypertension: ***  at goal of < 140/90 mmHg.       Signed electronically by: Mack Guise, MD  [facility] End[facility]in[facility]  [facility] [address] [place], [place] [address] Phone: [phone] FAX: [phone]   CC: London Pepper, MD [place] [address] Phone: [phone]  Fax: [phone]    Return to Endocrinology clinic as below: Future Appointments  Date Time Provider [place]  12/14/2018  3:20 PM Caleesi Kohl, Melanie Crazier, MD [facility] None  12/21/2018  8:15 AM [facility] LAB 4 [facility] None  12/21/2018  9:00 AM [facility] INFUSION [facility] None  12/28/2018  8:00 AM [facility] LAB 4 [facility] None  12/28/2018  8:30 AM Truitt Merle, MD [facility] None  12/28/2018  9:00 AM [facility] INFUSION [facility] None  01/04/2019  8:45 AM [facility] INFUSION [facility] None  01/11/2019  8:45 AM [facility] INFUSION [facility] None  02/12/2019  3:00 PM Marybelle Killings, MD [facility] None

## 2018-12-14 NOTE — Progress Notes (Signed)
MD ok to treat w/ ANC = 1.1 Zometa due today.  Kennith Center, Pharm.D., CPP 12/14/2018@9 :22 AM

## 2018-12-17 ENCOUNTER — Telehealth: Payer: Self-pay | Admitting: Hematology

## 2018-12-17 NOTE — Telephone Encounter (Signed)
Scheduled appt per 8/21 los.

## 2018-12-20 ENCOUNTER — Telehealth: Payer: Self-pay

## 2018-12-20 NOTE — Telephone Encounter (Signed)
Faxed refill for Revlimid along with [place] [phone] to [facility], sent to HIM for scan to chart.

## 2018-12-21 ENCOUNTER — Inpatient Hospital Stay: Payer: 59

## 2018-12-21 ENCOUNTER — Other Ambulatory Visit: Payer: Self-pay

## 2018-12-21 ENCOUNTER — Encounter: Payer: Self-pay | Admitting: Hematology

## 2018-12-21 ENCOUNTER — Inpatient Hospital Stay (HOSPITAL_BASED_OUTPATIENT_CLINIC_OR_DEPARTMENT_OTHER): Payer: 59 | Admitting: Hematology

## 2018-12-21 VITALS — BP 125/89 | HR 95 | Temp 98.7°F | Resp 17

## 2018-12-21 DIAGNOSIS — C9000 Multiple myeloma not having achieved remission: Secondary | ICD-10-CM | POA: Diagnosis not present

## 2018-12-21 DIAGNOSIS — Z5111 Encounter for antineoplastic chemotherapy: Secondary | ICD-10-CM | POA: Diagnosis not present

## 2018-12-21 DIAGNOSIS — E119 Type 2 diabetes mellitus without complications: Secondary | ICD-10-CM | POA: Diagnosis not present

## 2018-12-21 LAB — CBC WITH DIFFERENTIAL (CANCER CENTER ONLY)
Abs Immature Granulocytes: 0.02 10*3/uL (ref 0.00–0.07)
Basophils Absolute: 0 10*3/uL (ref 0.0–0.1)
Basophils Relative: 1 %
Eosinophils Absolute: 0.1 10*3/uL (ref 0.0–0.5)
Eosinophils Relative: 2 %
HCT: 32.8 % — ABNORMAL LOW (ref 39.0–52.0)
Hemoglobin: 11.2 g/dL — ABNORMAL LOW (ref 13.0–17.0)
Immature Granulocytes: 1 %
Lymphocytes Relative: 54 %
Lymphs Abs: 2.1 10*3/uL (ref 0.7–4.0)
MCH: 30.6 pg (ref 26.0–34.0)
MCHC: 34.1 g/dL (ref 30.0–36.0)
MCV: 89.6 fL (ref 80.0–100.0)
Monocytes Absolute: 0.3 10*3/uL (ref 0.1–1.0)
Monocytes Relative: 7 %
Neutro Abs: 1.4 10*3/uL — ABNORMAL LOW (ref 1.7–7.7)
Neutrophils Relative %: 35 %
Platelet Count: 302 10*3/uL (ref 150–400)
RBC: 3.66 MIL/uL — ABNORMAL LOW (ref 4.22–5.81)
RDW: 14.6 % (ref 11.5–15.5)
WBC Count: 3.8 10*3/uL — ABNORMAL LOW (ref 4.0–10.5)
nRBC: 0 % (ref 0.0–0.2)

## 2018-12-21 LAB — CMP (CANCER CENTER ONLY)
ALT: 7 U/L (ref 0–44)
AST: 11 U/L — ABNORMAL LOW (ref 15–41)
Albumin: 3.7 g/dL (ref 3.5–5.0)
Alkaline Phosphatase: 80 U/L (ref 38–126)
Anion gap: 12 (ref 5–15)
BUN: 8 mg/dL (ref 6–20)
CO2: 21 mmol/L — ABNORMAL LOW (ref 22–32)
Calcium: 8.6 mg/dL — ABNORMAL LOW (ref 8.9–10.3)
Chloride: 105 mmol/L (ref 98–111)
Creatinine: 0.84 mg/dL (ref 0.61–1.24)
GFR, Est AFR Am: >60 mL/min (ref >60)
GFR, Estimated: >60 mL/min (ref >60)
Glucose, Bld: 110 mg/dL — ABNORMAL HIGH (ref 70–99)
Potassium: 3.4 mmol/L — ABNORMAL LOW (ref 3.5–5.1)
Sodium: 138 mmol/L (ref 135–145)
Total Bilirubin: 0.3 mg/dL (ref 0.3–1.2)
Total Protein: 8.4 g/dL — ABNORMAL HIGH (ref 6.5–8.1)

## 2018-12-21 MED ORDER — PROCHLORPERAZINE MALEATE 10 MG PO TABS
10.0000 mg | ORAL_TABLET | Freq: Once | ORAL | Status: AC
  Administered 2018-12-21: 10 mg via ORAL

## 2018-12-21 MED ORDER — PROCHLORPERAZINE MALEATE 10 MG PO TABS
ORAL_TABLET | ORAL | Status: AC
  Filled 2018-12-21: qty 1

## 2018-12-21 MED ORDER — BORTEZOMIB CHEMO SQ INJECTION 3.5 MG (2.5MG/ML)
1.3000 mg/m2 | Freq: Once | INTRAMUSCULAR | Status: AC
  Administered 2018-12-21: 11:00:00 3 mg via SUBCUTANEOUS
  Filled 2018-12-21: qty 1.2

## 2018-12-21 MED ORDER — OXYCODONE HCL 5 MG PO TABA: 5.0000 mg | ORAL_TABLET | Freq: Three times a day (TID) | ORAL | 0 refills | Status: DC | PRN

## 2018-12-21 NOTE — Progress Notes (Signed)
[place]   [phone] [phone]   Clinic Follow up Note   Patient Care Team: London Pepper, MD as PCP - General (Family Medicine)  Date of Service:  12/21/2018  CHIEF COMPLAINT: F/u of MM  SUMMARY OF ONCOLOGIC HISTORY: Oncology History Overview Note  Cancer Staging Multiple myeloma ([place]) Staging form: Plasma Cell Myeloma and Plasma Cell Disorders, AJCC 8th Edition - Clinical stage from 09/04/2018: RISS Stage III (Beta-2-microglobulin (mg/L): 5.5, Albumin (g/dL): 2.6, ISS: Stage III, High-risk cytogenetics: Absent, LDH: Elevated) - Signed by Truitt Merle, MD on 09/26/2018     Multiple myeloma ([place])  08/31/2018 Initial Diagnosis   Multiple myeloma ([place])   09/04/2018 Cancer Staging   Staging form: Plasma Cell Myeloma and Plasma Cell Disorders, AJCC 8th Edition - Clinical stage from 09/04/2018: RISS Stage III (Beta-2-microglobulin (mg/L): 5.5, Albumin (g/dL): 2.6, ISS: Stage III, High-risk cytogenetics: Absent, LDH: Elevated) - Signed by Truitt Merle, MD on 09/26/2018   09/04/2018 Initial Biopsy   Diagnosis 09/04/18 Bone Marrow, Aspirate,Biopsy, and Clot, left posterior iliac crest BONE MARROW: - CELLULAR MARROW WITH INVOLVEMENT BY PLASMA CELL NEOPLASM (>95%) - SEE COMMENT PERIPHERAL BLOOD: - NORMOCYTIC ANEMIA - ROULEAUX FORMATION - SEE COMPLETE BLOOD COUNT   09/05/2018 -  Chemotherapy   Weekly velcade injection and dexamethasone 22m starting 09/05/18             -increase Velcade injection to twice a week (Fridays/Mondays) for 2 weeks on, 1 week off starting 10/19/18. Will return to weekly Velcade starting 11/09/18.              -Dexa stopped 12/14/18 due to hyperglycemia. Restart on 9/4 at 237mWeekly.    09/14/2018 - 12/21/2018 Chemotherapy   Revlimid 2566m weeks on/1 week off starting 09/14/18. Stopped 12/21/18 due to abdominal pain.        CURRENT THERAPY:  1. Weekly velcade injection and dexamethasone 67m54marting 09/05/18,Revlimid 25mg44my2  weeks onand 1 week off starting 09/14/18.  -increase Velcade injection to twice a week (Fridays/Mondays) for 2 weeks on, 1 week off starting 10/19/18. Will return to weekly Velcade starting 11/09/18.             -Dexa stopped 12/14/18 due to hyperglycemia. Will restart on 9/4 at 20mg 36mly.   -Due to Abdominal pain, Revlimid stopped 12/21/18.  2. MonthlyZometastarted on 09/05/2018  INTERVAL HISTORY:  Edward Todd for a follow up and treatment. He presents to the clinic alone. He notes on day 2 of restarting Revlimid he had abdominal pain again. He had continued taking it even today. His pain is 3/10. He take half tablet of oxycodone at a time. With oxycodone his pain will only last a few minutes but without it pain will last for hours. He take 1-1.5 tabs a day total He notes he take Revlimid after breakfast. He otherwise would not have this pain without Revlimid. He is already on Protonix.  He notes he has titrated up his mobility. He is now up walking for 30 minutes at a time.    REVIEW OF SYSTEMS:   Constitutional: Denies fevers, chills or abnormal weight loss Eyes: Denies blurriness of vision Ears, nose, mouth, throat, and face: Denies mucositis or sore throat Respiratory: Denies cough, dyspnea or wheezes Cardiovascular: Denies palpitation, chest discomfort or lower extremity swelling Gastrointestinal:  Denies nausea, heartburn or change in bowel habits (+) intermittent abdominal pain .  Skin: Denies abnormal skin rashes Lymphatics: Denies new lymphadenopathy or easy bruising Neurological:Denies numbness, tingling  or new weaknesses Behavioral/Psych: Mood is stable, no new changes  All other systems were reviewed with the patient and are negative.  MEDICAL HISTORY:  Past Medical History:  Diagnosis Date  . Cancer ([place])   . Diabetes mellitus without complication ([place])   . Hypertension     SURGICAL HISTORY: History reviewed. No pertinent surgical history.   I have reviewed the social history and family history with the patient and they are unchanged from previous note.  ALLERGIES:  is allergic to shellfish allergy.  MEDICATIONS:  Current Outpatient Medications  Medication Sig Dispense Refill  . acetaminophen (TYLENOL) 325 MG tablet Take 2 tablets (650 mg total) by mouth every 6 (six) hours as needed for mild pain or fever.    Marland Kitchen acetaminophen (TYLENOL) 500 MG tablet Take 500 mg by mouth every 6 (six) hours as needed for mild pain.    Marland Kitchen acyclovir (ZOVIRAX) 400 MG tablet TAKE 1 TABLET BY MOUTH TWICE A DAY (Patient taking differently: Take 400 mg by mouth 2 (two) times daily. ) 60 tablet 0  . aspirin 81 MG chewable tablet Chew 1 tablet (81 mg total) by mouth daily.    . calcium-vitamin D (OSCAL WITH D) 500-200 MG-UNIT tablet Take 1 tablet by mouth 2 (two) times daily. 60 tablet 1  . Continuous Blood Gluc Receiver (FREESTYLE LIBRE 14 DAY READER) DEVI See admin instructions.    . Continuous Blood Gluc Sensor (FREESTYLE LIBRE 14 DAY SENSOR) MISC APPLY EVERY 14 DAYS    . HUMALOG KWIKPEN 100 UNIT/ML KwikPen Inject 0-10 Units into the skin 2 (two) times daily. Per sliding scale, Hold if BS <120    . Melatonin 3 MG CAPS Take 1 capsule (3 mg total) by mouth at bedtime as needed (sleep). 30 capsule 0  . metFORMIN (GLUCOPHAGE) 1000 MG tablet TAKE 1 TABLET (1,000 MG TOTAL) BY MOUTH 2 (TWO) TIMES DAILY WITH A MEAL. 60 tablet 0  . methocarbamol (ROBAXIN) 750 MG tablet Take 1 tablet (750 mg total) by mouth 3 (three) times daily. (Patient taking differently: Take 750 mg by mouth 3 (three) times daily as needed for muscle spasms. ) 90 tablet 2  . metoCLOPramide (REGLAN) 10 MG tablet Take 1 tablet (10 mg total) by mouth every 8 (eight) hours as needed for nausea. 20 tablet 0  . ondansetron (ZOFRAN-ODT) 4 MG disintegrating tablet Take 1 tablet (4 mg total) by mouth every 6 (six) hours as needed for nausea or vomiting. 20 tablet 0  . ONETOUCH VERIO test strip USE TO  CHECK BLOOD SUGAR 3 TIMES A DAY    . pantoprazole (PROTONIX) 40 MG tablet TAKE 1 TABLET (40 MG TOTAL) BY MOUTH DAILY AT 6 (SIX) AM. 30 tablet 0  . polyethylene glycol (MIRALAX / GLYCOLAX) 17 g packet Take 17 g by mouth daily. (Patient taking differently: Take 17 g by mouth daily as needed for mild constipation. ) 14 each 0  . REVLIMID 25 MG capsule TAKE 1 CAPSULE BY MOUTH  DAILY FOR 14 DAYS, THEN 7  DAYS OFF (Patient taking differently: Take 25 mg by mouth See admin instructions. Take 25 mg by mouth daily for 14 days and then take 7 days off) 14 capsule 0  . senna-docusate (SENOKOT-S) 8.6-50 MG tablet Take 1 tablet by mouth 2 (two) times daily. (Patient taking differently: Take 1 tablet by mouth 2 (two) times daily as needed for mild constipation. )    . dexamethasone (DECADRON) 4 MG tablet Take 5 tablets (20 mg total) by  mouth every Friday. (Patient not taking: Reported on 12/21/2018) 20 tablet 2  . OxyCODONE HCl, Abuse Deter, (OXAYDO) 5 MG TABA Take 5 mg by mouth every 8 (eight) hours as needed. 70 tablet 0   No current facility-administered medications for this visit.     PHYSICAL EXAMINATION: ECOG PERFORMANCE STATUS: 3 - Symptomatic, >50% confined to bed  Vitals with BMI 12/21/2018  Height   Weight   BMI   Systolic 767  Diastolic 89  Pulse 95  Respirations 17    GENERAL:alert, no distress and comfortable SKIN: skin color, texture, turgor are normal, no rashes or significant lesions EYES: normal, Conjunctiva are pink and non-injected, sclera clear  NECK: supple, thyroid normal size, non-tender, without nodularity LYMPH:  no palpable lymphadenopathy in the cervical, axillary  LUNGS: clear to auscultation and percussion with normal breathing effort HEART: regular rate & rhythm and no murmurs and no lower extremity edema ABDOMEN:abdomen soft, non-tender and normal bowel sounds Musculoskeletal:no cyanosis of digits and no clubbing  NEURO: alert & oriented x 3 with fluent speech, no  focal motor/sensory deficits  LABORATORY DATA:  I have reviewed the data as listed CBC Latest Ref Rng & Units 12/21/2018 12/14/2018 12/07/2018  WBC 4.0 - 10.5 K/uL 3.8(L) 4.1 4.4  Hemoglobin 13.0 - 17.0 g/dL 11.2(L) 11.3(L) 10.6(L)  Hematocrit 39.0 - 52.0 % 32.8(L) 32.8(L) 31.0(L)  Platelets 150 - 400 K/uL 302 190 221     CMP Latest Ref Rng & Units 12/21/2018 12/14/2018 12/07/2018  Glucose 70 - 99 mg/dL 110(H) 121(H) 112(H)  BUN 6 - 20 mg/dL '8 7 6  ' Creatinine 0.61 - 1.24 mg/dL 0.84 0.88 0.86  Sodium 135 - 145 mmol/L 138 138 139  Potassium 3.5 - 5.1 mmol/L 3.4(L) 3.7 4.0  Chloride 98 - 111 mmol/L 105 107 107  CO2 22 - 32 mmol/L 21(L) 21(L) 22  Calcium 8.9 - 10.3 mg/dL 8.6(L) 8.8(L) 8.7(L)  Total Protein 6.5 - 8.1 g/dL 8.4(H) 8.2(H) 7.6  Total Bilirubin 0.3 - 1.2 mg/dL 0.3 0.3 0.4  Alkaline Phos 38 - 126 U/L 80 105 91  AST 15 - 41 U/L 11(L) 10(L) 10(L)  ALT 0 - 44 U/L '7 7 7      ' RADIOGRAPHIC STUDIES: I have personally reviewed the radiological images as listed and agreed with the findings in the report. No results found.   ASSESSMENT & PLAN:  Edward Todd is a 43 y.o. male with   1. 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 Work upconfirmed multiple myeloma, unfortunately he has multiple compression fracture of thoracic and lumbar spine. -I started him oninduction chemo with HAL:[id number] Velcade injections with Dexamethasonestartedon 09/05/18 and oral Revlimid on 09/14/18. He could not tolerated Velcade injection twice a week -His cytogeneticsand FISHresults showed trisomy 11, FISH panel otherwise negative, this is considered a standard risk.  -He is a candidate for stem cell transplant. He will have consult [facility] Dr. Norma Fredrickson next Monday. Given his standard risk disease, he may respond well to induction chemotherapy, bone marrowtransplant could bedeferred if he  has good response to induction chemo. -Ipreviouslyrecommend PET scan for base line, but it was denied by his insurance -He has developed nausea, vomiting, more fatigue lately, was evaluated in ED. his symptoms improved since I held dexamethasone and Revlimid 3 weeks ago -He restarted Revlimid on 8/24 and on day 2 he developed 3/10 abdominal pain again for a few house after taking  medication. This has recurrent every day after he taking Revlimid. Given this is likely related, will stop Revlimid and restart Dexa on 9/4 at 25m weekly.  -I discussed adding other agent such as IV Cytoxan or oral Pomalyst for his induction chemo, potential benefit and side effects discussed with him.  He is reluctant to use IV chemo. Will discuss further after his meeting with Dr. RNorma Fredrickson  -He plans to see Dr. RNorma Fredricksonnext week for any additional suggestions and discuss the option of Bone Marrow Transplant. -Labs reviewed, CBC and CMP WNL except WBC 3.8, Hg 11.2, ANC 1.4, K 3.4, BG 110, Ca 8.6, Protein 8.4, AST 11. MM panel still pending. Overall adequate to proceed with Velcade injection today.  -Next Zometa infusion 9/4, will continue every 4 weeks  -F/u next week   2. Anemia, secondary to MM -He required blood transfusion 09/12/18. -On Oral iron -Labs reviewed, Hg at11.2today (8/28//20), overall much improved since he started MM treatment  -Continue to monitor.   3. Severe low back pain  -secondary to thoracic and lumbar compression fractures,lumbar spine stenosisseen on 08/31/18 bone survey.  -He has completed rehab and currently doing PT. -He is on Robaxin 7543mand oxycodone 1060m4 times a dayas needed which is mostly controlling his pain.  -He will continuestool softeners as needed to prevent constipation from pain meds.  -His peripheral strength and ambulationcontinues to slowlyimprove.He will continue PT. He is now able to ambulate for 30 minutes at a time.  -His back pain is  occasional, but mostly gone. He notes he continues to reduce his oxycodone 87m89mow on half tablet 2-3 times a day.  -Will refill Oxycodone at reduced 5mg 86m will continue to slowly wean off if tolerable (12/21/18) -He will continue to f/u with orthopedics Dr. YatesLorin Mercy. DM, Type II, hyperglycemia -On Metformin BID -I againencouraged him to reduce carbohydrates, juice, soft drinks in diet.  -He is being seen by Dr. EllisLoanne Drillinghas sugar monitor placed on right arm. He is currently on no carb diet and will continue metformin.  -I encouraged him to watch his weight. He can increase protein and calorie to avoid losing too much weight. He understands.  -I will hold Dexa on 12/14/18 and restarted on 9/4 at 20mg 51mly.  -BG at110today (12/21/18).  5. Financial support -I offered him our [facility] resources that are available to him. -He does not have insurance and he has applied for disability. -He met our financial advocate.   PLAN: -Labs reviewed and adequate to proceed with Velcade today and continue weekly  -restart dexa at 20mg w44my next week  -D/c Revlimid due to gastric pain, will make a decision about adding Cytoxan or Pomlyst next week after meeting Dr. RodriguNorma Fredricksonnue Zometa monthly  -Lab, f/u and Velcade and Zometa next week   No problem-specific Assessment & Plan notes found for this encounter.   No orders of the defined types were placed in this encounter.  All questions were answered. The patient knows to call the clinic with any problems, questions or concerns. No barriers to learning was detected. I spent 20 minutes counseling the patient face to face. The total time spent in the appointment was 25 minutes and more than 50% was on counseling and review of test results     Albertia Carvin FenTruitt Merle28/2020   I, Amoya BJoslyn Devonting as scribe for Ngai Parcell FenTruitt Merle I have reviewed the above documentation for accuracy and completeness, and I agree  with the above.

## 2018-12-21 NOTE — Patient Instructions (Signed)
[facility] Discharge Instructions for Patients Receiving Chemotherapy  Today you received the following chemotherapy agents: Bortezomib (Velcade)  To help prevent nausea and vomiting after your treatment, we encourage you to take your nausea medication as directed.   If you develop nausea and vomiting that is not controlled by your nausea medication, call the clinic.   BELOW ARE SYMPTOMS THAT SHOULD BE REPORTED IMMEDIATELY:  *FEVER GREATER THAN 100.5 F  *CHILLS WITH OR WITHOUT FEVER  NAUSEA AND VOMITING THAT IS NOT CONTROLLED WITH YOUR NAUSEA MEDICATION  *UNUSUAL SHORTNESS OF BREATH  *UNUSUAL BRUISING OR BLEEDING  TENDERNESS IN MOUTH AND THROAT WITH OR WITHOUT PRESENCE OF ULCERS  *URINARY PROBLEMS  *BOWEL PROBLEMS  UNUSUAL RASH Items with * indicate a potential emergency and should be followed up as soon as possible.  Feel free to call the clinic should you have any questions or concerns. The clinic phone number is [phone].  Please show the CHEMO ALERT CARD at check-in to the Emergency Department and triage nurse.  Coronavirus (COVID-19) Are you at risk?  Are you at risk for the Coronavirus (COVID-19)?  To be considered HIGH RISK for Coronavirus (COVID-19), you have to meet the following criteria:  . Traveled to [place], [place], [place], [place] or [place]; or in the [place] to [place], [place], [place], or [place]; and have fever, cough, and shortness of breath within the last 2 weeks of travel OR . Been in close contact with a person diagnosed with COVID-19 within the last 2 weeks and have fever, cough, and shortness of breath . IF YOU DO NOT MEET THESE CRITERIA, YOU ARE CONSIDERED LOW RISK FOR COVID-19.  What to do if you are HIGH RISK for COVID-19?  . If you are having a medical emergency, call 911. . Seek medical care right away. Before you go to a doctor's office, urgent care or emergency department, call ahead and tell them  about your recent travel, contact with someone diagnosed with COVID-19, and your symptoms. You should receive instructions from your physician's office regarding next steps of care.  . When you arrive at healthcare provider, tell the healthcare staff immediately you have returned from visiting [place], [place], [place], [place] or [place]; or traveled in the [place] to [place], [place], [place], or [place]; in the last two weeks or you have been in close contact with a person diagnosed with COVID-19 in the last 2 weeks.   . Tell the health care staff about your symptoms: fever, cough and shortness of breath. . After you have been seen by a medical provider, you will be either: o Tested for (COVID-19) and discharged home on quarantine except to seek medical care if symptoms worsen, and asked to  - Stay home and avoid contact with others until you get your results (4-5 days)  - Avoid travel on public transportation if possible (such as bus, train, or airplane) or o Sent to the Emergency Department by EMS for evaluation, COVID-19 testing, and possible admission depending on your condition and test results.  What to do if you are LOW RISK for COVID-19?  Reduce your risk of any infection by using the same precautions used for avoiding the common cold or flu:  . Wash your hands often with soap and warm water for at least 20 seconds.  If soap and water are not readily available, use an alcohol-based hand sanitizer with at least 60% alcohol.  . If coughing or   sneezing, cover your mouth and nose by coughing or sneezing into the elbow areas of your shirt or coat, into a tissue or into your sleeve (not your hands). . Avoid shaking hands with others and consider head nods or verbal greetings only. . Avoid touching your eyes, nose, or mouth with unwashed hands.  . Avoid close contact with people who are sick. . Avoid places or events with large numbers of people in one location, like concerts or  sporting events. . Carefully consider travel plans you have or are making. . If you are planning any travel outside or inside the US, visit the CDC's Travelers' Health webpage for the latest health notices. . If you have some symptoms but not all symptoms, continue to monitor at home and seek medical attention if your symptoms worsen. . If you are having a medical emergency, call 911.   ADDITIONAL HEALTHCARE OPTIONS FOR PATIENTS  [facility] / e-Visit: [email or web address]         [facility]: [phone]  [facility]: [phone]                   [facility]: [phone]   

## 2018-12-21 NOTE — Progress Notes (Signed)
Per Dr. Burr Medico: OK to treat with ANC of 1.4;   Dr. Burr Medico to see patient in treatment area r/t stomach pain from resuming Revlimid

## 2018-12-24 ENCOUNTER — Telehealth: Payer: Self-pay | Admitting: Hematology

## 2018-12-24 ENCOUNTER — Other Ambulatory Visit: Payer: Self-pay | Admitting: Hematology

## 2018-12-24 DIAGNOSIS — C9000 Multiple myeloma not having achieved remission: Secondary | ICD-10-CM

## 2018-12-24 LAB — MULTIPLE MYELOMA PANEL, SERUM
Albumin SerPl Elph-Mcnc: 3.8 g/dL (ref 2.9–4.4)
Albumin/Glob SerPl: 1 (ref 0.7–1.7)
Alpha 1: 0.2 g/dL (ref 0.0–0.4)
Alpha2 Glob SerPl Elph-Mcnc: 0.9 g/dL (ref 0.4–1.0)
B-Globulin SerPl Elph-Mcnc: 0.7 g/dL (ref 0.7–1.3)
Gamma Glob SerPl Elph-Mcnc: 2.3 g/dL — ABNORMAL HIGH (ref 0.4–1.8)
Globulin, Total: 4.2 g/dL — ABNORMAL HIGH (ref 2.2–3.9)
IgA: 19 mg/dL — ABNORMAL LOW (ref 90–386)
IgG (Immunoglobin G), Serum: 2058 mg/dL — ABNORMAL HIGH (ref 603–1613)
IgM (Immunoglobulin M), Srm: 25 mg/dL (ref 20–172)
M Protein SerPl Elph-Mcnc: 2 g/dL — ABNORMAL HIGH
Total Protein ELP: 8 g/dL (ref 6.0–8.5)

## 2018-12-24 LAB — KAPPA/LAMBDA LIGHT CHAINS
Kappa free light chain: 80 mg/L — ABNORMAL HIGH (ref 3.3–19.4)
Kappa, lambda light chain ratio: 17.02 — ABNORMAL HIGH (ref 0.26–1.65)
Lambda free light chains: 4.7 mg/L — ABNORMAL LOW (ref 5.7–26.3)

## 2018-12-24 MED ORDER — POMALIDOMIDE 4 MG PO CAPS: 4.0000 mg | ORAL_CAPSULE | Freq: Every day | ORAL | 0 refills | Status: DC

## 2018-12-24 MED ORDER — POMALIDOMIDE 4 MG PO CAPS: 4.0000 mg | ORAL_CAPSULE | Freq: Every day | ORAL | 1 refills | Status: DC

## 2018-12-24 NOTE — Progress Notes (Signed)
[facility]   Telephone:(336) [phone] [phone]) [phone]   Clinic Follow up Note   Patient Care Team: London Pepper, MD as PCP - General (Family Medicine)  Date of Service:  12/28/2018  CHIEF COMPLAINT: F/u of MM  SUMMARY OF ONCOLOGIC HISTORY: Oncology History Overview Note  Cancer Staging Multiple myeloma ([place]) Staging form: Plasma Cell Myeloma and Plasma Cell Disorders, AJCC 8th Edition - Clinical stage from 09/04/2018: RISS Stage III (Beta-2-microglobulin (mg/L): 5.5, Albumin (g/dL): 2.6, ISS: Stage III, High-risk cytogenetics: Absent, LDH: Elevated) - Signed by Truitt Merle, MD on 09/26/2018     Multiple myeloma ([place])  08/31/2018 Initial Diagnosis   Multiple myeloma ([place])   09/04/2018 Cancer Staging   Staging form: Plasma Cell Myeloma and Plasma Cell Disorders, AJCC 8th Edition - Clinical stage from 09/04/2018: RISS Stage III (Beta-2-microglobulin (mg/L): 5.5, Albumin (g/dL): 2.6, ISS: Stage III, High-risk cytogenetics: Absent, LDH: Elevated) - Signed by Truitt Merle, MD on 09/26/2018   09/04/2018 Initial Biopsy   Diagnosis 09/04/18 Bone Marrow, Aspirate,Biopsy, and Clot, left posterior iliac crest BONE MARROW: - CELLULAR MARROW WITH INVOLVEMENT BY PLASMA CELL NEOPLASM (>95%) - SEE COMMENT PERIPHERAL BLOOD: - NORMOCYTIC ANEMIA - ROULEAUX FORMATION - SEE COMPLETE BLOOD COUNT   09/05/2018 - 12/28/2018 Chemotherapy   Weekly velcade injection and dexamethasone 71m starting 09/05/18             -increase Velcade injection to twice a week (Fridays/Mondays) for 2 weeks on, 1 week off starting 10/19/18. Will return to weekly Velcade starting 11/09/18.              -Dexa stopped 12/14/18 due to hyperglycemia. Restart on 9/4 at 258mWeekly.              -Stop Velcade and change Dexa after 12/28/18.    09/14/2018 - 12/21/2018 Chemotherapy   Revlimid 2555m weeks on/1 week off starting 09/14/18. Stopped 12/21/18 due to abdominal pain.      Chemotherapy   IV weekly Daratumumab and oral  Dexa 8mg60my before infusion starting 01/04/19    Chemotherapy   Pomalyst 4mg 24meeks on/ 1 week off starting 12/29/18.       CURRENT THERAPY:  1. Weekly velcade injection and dexamethasone 40mg 81mting 09/05/18,Revlimid 25mgda15m weeks onand 1 week off starting 09/14/18.  -increase Velcade injection to twice a week (Fridays/Mondays) for 2 weeks on, 1 week off starting 10/19/18. Will return to weekly Velcade starting 11/09/18. -Dexa stopped 12/14/18 due to hyperglycemia. Will restart on 9/4 at 20mg we9m.              -Due to Abdominal pain, Revlimid stopped 12/21/18.   -Stop Velcade and change Dexa after 12/28/18.  2. MonthlyZometastarted on 09/05/2018 3. IV weekly Daratumumab and oral Dexa 8mg day 50more infusion starting 01/04/19, Pomalyst 4mg 3 wee4mon/ 1 week off starting 12/29/18.   INTERVAL HISTORY:  Edward Todd a follow up and treatment. He presents to the clinic alone. He notes with d/c of Revlimid he has not more abdominal pain. He was seen by Dr. Rodriguez Norma Fredricksonll proceed with bone marrow transplant after some treatments of Daratumumab and Dexa and Pomalyst.    REVIEW OF SYSTEMS:   Constitutional: Denies fevers, chills or abnormal weight loss Eyes: Denies blurriness of vision Ears, nose, mouth, throat, and face: Denies mucositis or sore throat Respiratory: Denies cough, dyspnea or wheezes Cardiovascular: Denies palpitation, chest discomfort or lower extremity swelling Gastrointestinal:  Denies nausea, heartburn or change in  bowel habits Skin: Denies abnormal skin rashes Lymphatics: Denies new lymphadenopathy or easy bruising Neurological:Denies numbness, tingling or new weaknesses Behavioral/Psych: Mood is stable, no new changes  All other systems were reviewed with the patient and are negative.  MEDICAL HISTORY:  Past Medical History:  Diagnosis Date  . Cancer ([place])   . Diabetes mellitus without complication ([place])   .  Hypertension     SURGICAL HISTORY: History reviewed. No pertinent surgical history.  I have reviewed the social history and family history with the patient and they are unchanged from previous note.  ALLERGIES:  is allergic to shellfish allergy.  MEDICATIONS:  Current Outpatient Medications  Medication Sig Dispense Refill  . acetaminophen (TYLENOL) 325 MG tablet Take 2 tablets (650 mg total) by mouth every 6 (six) hours as needed for mild pain or fever.    Marland Kitchen acetaminophen (TYLENOL) 500 MG tablet Take 500 mg by mouth every 6 (six) hours as needed for mild pain.    Marland Kitchen acyclovir (ZOVIRAX) 400 MG tablet TAKE 1 TABLET BY MOUTH TWICE A DAY 60 tablet 0  . aspirin 81 MG chewable tablet Chew 1 tablet (81 mg total) by mouth daily.    . calcium-vitamin D (OSCAL WITH D) 500-200 MG-UNIT tablet Take 1 tablet by mouth 2 (two) times daily. 60 tablet 1  . Continuous Blood Gluc Receiver (FREESTYLE LIBRE 14 DAY READER) DEVI See admin instructions.    . Continuous Blood Gluc Sensor (FREESTYLE LIBRE 14 DAY SENSOR) MISC APPLY EVERY 14 DAYS    . dexamethasone (DECADRON) 4 MG tablet Take 5 tablets (20 mg total) by mouth every Friday. (Patient not taking: Reported on 12/21/2018) 20 tablet 2  . HUMALOG KWIKPEN 100 UNIT/ML KwikPen Inject 0-10 Units into the skin 2 (two) times daily. Per sliding scale, Hold if BS <120    . Melatonin 3 MG CAPS Take 1 capsule (3 mg total) by mouth at bedtime as needed (sleep). 30 capsule 0  . metFORMIN (GLUCOPHAGE) 1000 MG tablet TAKE 1 TABLET (1,000 MG TOTAL) BY MOUTH 2 (TWO) TIMES DAILY WITH A MEAL. 60 tablet 0  . methocarbamol (ROBAXIN) 750 MG tablet Take 1 tablet (750 mg total) by mouth 3 (three) times daily. (Patient taking differently: Take 750 mg by mouth 3 (three) times daily as needed for muscle spasms. ) 90 tablet 2  . metoCLOPramide (REGLAN) 10 MG tablet Take 1 tablet (10 mg total) by mouth every 8 (eight) hours as needed for nausea. 20 tablet 0  . ondansetron (ZOFRAN-ODT) 4  MG disintegrating tablet Take 1 tablet (4 mg total) by mouth every 6 (six) hours as needed for nausea or vomiting. 20 tablet 0  . ONETOUCH VERIO test strip USE TO CHECK BLOOD SUGAR 3 TIMES A DAY    . OxyCODONE HCl, Abuse Deter, (OXAYDO) 5 MG TABA Take 5 mg by mouth every 8 (eight) hours as needed. 70 tablet 0  . pantoprazole (PROTONIX) 40 MG tablet TAKE 1 TABLET (40 MG TOTAL) BY MOUTH DAILY AT 6 (SIX) AM. 30 tablet 0  . polyethylene glycol (MIRALAX / GLYCOLAX) 17 g packet Take 17 g by mouth daily. (Patient taking differently: Take 17 g by mouth daily as needed for mild constipation. ) 14 each 0  . pomalidomide (POMALYST) 4 MG capsule Take 1 capsule (4 mg total) by mouth daily. Take with water on days 1-21 of each 28 day cycle. [employer]# [id number], obtained 12/25/2018 21 capsule 0  . senna-docusate (SENOKOT-S) 8.6-50 MG tablet Take 1 tablet by mouth  2 (two) times daily. (Patient taking differently: Take 1 tablet by mouth 2 (two) times daily as needed for mild constipation. )     No current facility-administered medications for this visit.     PHYSICAL EXAMINATION: ECOG PERFORMANCE STATUS: 3 - Symptomatic, >50% confined to bed  Vitals:   12/28/18 0826  BP: 115/89  Pulse: 96  Resp: 18  Temp: 98.2 F (36.8 C)  SpO2: 99%   Filed Weights   12/28/18 0826  Weight: 215 lb 4.8 oz (97.7 kg)    GENERAL:alert, no distress and comfortable SKIN: skin color, texture, turgor are normal, no rashes or significant lesions EYES: normal, Conjunctiva are pink and non-injected, sclera clear  NECK: supple, thyroid normal size, non-tender, without nodularity LYMPH:  no palpable lymphadenopathy in the cervical, axillary  LUNGS: clear to auscultation and percussion with normal breathing effort HEART: regular rate & rhythm and no murmurs and no lower extremity edema ABDOMEN:abdomen soft, non-tender and normal bowel sounds Musculoskeletal:no cyanosis of digits and no clubbing  NEURO: alert & oriented x 3 with  fluent speech, no focal motor/sensory deficits  LABORATORY DATA:  I have reviewed the data as listed CBC Latest Ref Rng & Units 12/28/2018 12/21/2018 12/14/2018  WBC 4.0 - 10.5 K/uL 5.9 3.8(L) 4.1  Hemoglobin 13.0 - 17.0 g/dL 10.4(L) 11.2(L) 11.3(L)  Hematocrit 39.0 - 52.0 % 31.0(L) 32.8(L) 32.8(L)  Platelets 150 - 400 K/uL 284 302 190     CMP Latest Ref Rng & Units 12/28/2018 12/21/2018 12/14/2018  Glucose 70 - 99 mg/dL 132(H) 110(H) 121(H)  BUN 6 - 20 mg/dL '6 8 7  ' Creatinine 0.61 - 1.24 mg/dL 0.80 0.84 0.88  Sodium 135 - 145 mmol/L 139 138 138  Potassium 3.5 - 5.1 mmol/L 3.3(L) 3.4(L) 3.7  Chloride 98 - 111 mmol/L 106 105 107  CO2 22 - 32 mmol/L 21(L) 21(L) 21(L)  Calcium 8.9 - 10.3 mg/dL 8.5(L) 8.6(L) 8.8(L)  Total Protein 6.5 - 8.1 g/dL 8.1 8.4(H) 8.2(H)  Total Bilirubin 0.3 - 1.2 mg/dL 0.4 0.3 0.3  Alkaline Phos 38 - 126 U/L 72 80 105  AST 15 - 41 U/L 12(L) 11(L) 10(L)  ALT 0 - 44 U/L '9 7 7      ' RADIOGRAPHIC STUDIES: I have personally reviewed the radiological images as listed and agreed with the findings in the report. No results found.   ASSESSMENT & PLAN:  Edward Todd is a 43 y.o. male with   1. 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 Work upconfirmed multiple myeloma, unfortunately he has multiple compression fracture of thoracic and lumbar spine. -I started him oninduction chemo with BTD:HRCBUL Velcade injections with Dexamethasonestartedon 09/05/18 and oral Revlimid on 09/14/18. He could not tolerated Velcade injection twice a week -His cytogeneticsand FISHresults showed trisomy 11, FISH panel otherwise negative, this is considered a standard risk.  -He has been on first line chemo VRD, due to poor tolerance to revlimid, I stopped it in August 2020. -He met with Dr. Norma Fredrickson who plans to move forward with bone marrow transplant after about 2 months. He recommend change  his treatment to Daratumumab, Dexa and Pomalyst. The patient is agreeable.  I reviewed the potential side effects with her in detail  -I discussed the option of PAC placement. Will discuss with Dr. Norma Fredrickson.  -Labs reviewed, CBC and CMP WNL except Hg 10.4, calcium 8.5. 24 hour urine is still pending. Will proceed with last velcade dose  today.  -Next Zometa infusion today, will continue every 4 weeks. I recommend he continue to have regular teeth cleanings. Increase oral calcium  -F/u next week before first dise Dara -He will start Pomlyst tomorrow   2. Anemia, secondary to MM -He required blood transfusion 09/12/18. -On Oral iron -Labs reviewed, Hg at10.4today (9/4//20), overall much improved since he started MM treatment  -Continue to monitor.   3. Severe low back pain  -secondary to thoracic and lumbar compression fractures,lumbar spine stenosisseen on 08/31/18 bone survey.  -He has completed rehab and currently doing PT. -He is on Robaxin 740m and oxycodone 171m-4 times a dayas needed which is mostly controlling his pain.  -He will continuestool softeners as needed to prevent constipation from pain meds.  -His peripheral strength and ambulationcontinues to slowlyimprove.He will continue PT. He is now able to ambulate for 30 minutes at a time.  -His back pain is occasional, but mostly gone. He notes he continues to reduce his oxycodone 1021mNow on (5mg46m-3 times a day.  -He will continue to slowly wean off if tolerable -He will continue to f/u with orthopedics Dr. YateLorin Mercy4. DM, Type II, hyperglycemia -On Metformin BID -I againencouraged him to reduce carbohydrates, juice, soft drinks in diet. -He is being seen by Dr. ElliLoanne Drilling has sugar monitor placed on right arm. He is currently on no carb diet and will continue metformin.  -I encouraged him to watch his weight. He can increase protein and calorie to avoid losing too much weight. He understands. -I will  hold Dexa on 12/14/18 and restarted on 9/4.  -BG at132today (12/28/18).overall much improved   5. Financial support -I offered him our [facility] resources that are available to him. -He does not have insurance and he has applied for disability.I gave him letter about his disability for 1 year.  -I recommend he see advocate LeniAnnamary Rummage billing department about help with his medical bills.    PLAN: -Labs reviewed and adequate to proceed with Velcade today (last dose) -continueZometa today and continue monthly -Start Pomalyst tomorrow  -Lab, f/u, Dara in 1 week -I wrote a letter to help his disability application    No problem-specific Assessment & Plan notes found for this encounter.   No orders of the defined types were placed in this encounter.  All questions were answered. The patient knows to call the clinic with any problems, questions or concerns. No barriers to learning was detected. I spent 20 minutes counseling the patient face to face. The total time spent in the appointment was 25 minutes and more than 50% was on counseling and review of test results     Marce Schartz Truitt Merle 12/28/2018   I, AmoyJoslyn Devon acting as scribe for Georjean Toya Truitt Merle.   I have reviewed the above documentation for accuracy and completeness, and I agree with the above.

## 2018-12-24 NOTE — Telephone Encounter (Signed)
No los per 8/28. °

## 2018-12-24 NOTE — Progress Notes (Signed)
DISCONTINUE ON PATHWAY REGIMEN - Multiple Myeloma and Other Plasma Cell Dyscrasias     A cycle is every 21 days:     Bortezomib      Lenalidomide      Dexamethasone   **Always confirm dose/schedule in your pharmacy ordering system**  REASON: Toxicities / Adverse Event PRIOR TREATMENT: [id number]: VRd (Bortezomib 1.3 mg/m2 Subcut D1, 8, 15 + Lenalidomide 25 mg + Dexamethasone 40 mg) q21 Days x 4-6 Cycles Maximum Prior to Stem Cell [facility] TREATMENT RESPONSE: Partial Response (PR)  START OFF PATHWAY REGIMEN - Multiple Myeloma and Other Plasma Cell Dyscrasias   [id number]:DaraPd (Daratumumab 16 mg/kg IV + Pomalidomide 4 mg PO + Dexamethasone 20/40 mg PO/IV) q28 days:   Cycles 1 and 2: A cycle is every 28 days:     Pomalidomide      Dexamethasone      Daratumumab    Cycles 3 through 6: A cycle is every 28 days:     Pomalidomide      Dexamethasone      Dexamethasone      Daratumumab    Cycles 7 and beyond: A cycle is every 28 days:     Pomalidomide      Dexamethasone      Dexamethasone      Daratumumab   **Always confirm dose/schedule in your pharmacy ordering system**  Patient Characteristics: Newly Diagnosed, Transplant Eligible, Standard Risk R-ISS Staging: III Disease Classification: Newly Diagnosed Is Patient Eligible for Transplant<= Transplant Eligible Risk Status: Standard Risk Intent of Therapy: Curative Intent, Discussed with Patient

## 2018-12-24 NOTE — Telephone Encounter (Signed)
Pt was seen by Dr. Debbrah Alar at [facility] today, who recommend changing his treatment to Dara/pomlyst/dex for 2 cycles, then proceed with stem cell transplant. I will get insurance approval for him, and plan to start [place] this Friday.  Edward Todd  12/24/2018

## 2018-12-25 ENCOUNTER — Other Ambulatory Visit: Payer: Self-pay | Admitting: *Deleted

## 2018-12-25 ENCOUNTER — Telehealth: Payer: Self-pay

## 2018-12-25 ENCOUNTER — Inpatient Hospital Stay: Payer: 59 | Attending: Hematology | Admitting: Nutrition

## 2018-12-25 ENCOUNTER — Telehealth: Payer: Self-pay | Admitting: Pharmacist

## 2018-12-25 ENCOUNTER — Other Ambulatory Visit: Payer: Self-pay | Admitting: Hematology

## 2018-12-25 DIAGNOSIS — C9000 Multiple myeloma not having achieved remission: Secondary | ICD-10-CM | POA: Insufficient documentation

## 2018-12-25 DIAGNOSIS — Z7982 Long term (current) use of aspirin: Secondary | ICD-10-CM | POA: Insufficient documentation

## 2018-12-25 DIAGNOSIS — I10 Essential (primary) hypertension: Secondary | ICD-10-CM | POA: Insufficient documentation

## 2018-12-25 DIAGNOSIS — D649 Anemia, unspecified: Secondary | ICD-10-CM | POA: Insufficient documentation

## 2018-12-25 DIAGNOSIS — Z79899 Other long term (current) drug therapy: Secondary | ICD-10-CM | POA: Insufficient documentation

## 2018-12-25 DIAGNOSIS — M545 Low back pain: Secondary | ICD-10-CM | POA: Insufficient documentation

## 2018-12-25 DIAGNOSIS — Z7984 Long term (current) use of oral hypoglycemic drugs: Secondary | ICD-10-CM | POA: Insufficient documentation

## 2018-12-25 DIAGNOSIS — E1165 Type 2 diabetes mellitus with hyperglycemia: Secondary | ICD-10-CM | POA: Insufficient documentation

## 2018-12-25 DIAGNOSIS — Z5112 Encounter for antineoplastic immunotherapy: Secondary | ICD-10-CM | POA: Insufficient documentation

## 2018-12-25 DIAGNOSIS — Z5111 Encounter for antineoplastic chemotherapy: Secondary | ICD-10-CM | POA: Insufficient documentation

## 2018-12-25 MED ORDER — POMALIDOMIDE 4 MG PO CAPS: 4.0000 mg | ORAL_CAPSULE | Freq: Every day | ORAL | 0 refills | Status: DC

## 2018-12-25 NOTE — Telephone Encounter (Signed)
Oral Oncology Pharmacist Encounter  Received notification of new prescription for Pomalyst (pomalidomide) for the treatment of multiple myeloma in relapse, ISS stage III, in conjunction with daratumumab and dexamethasone, planned duration 2 cycles then proceed to autologous stem cell transplant.  Patient with progression while on treatment with bortezomib, lenalidomide, and dexamethasone and is now under evaluation to change treatment to pomalidomide at 44m by mouth once daily for 21 days on, 7 days off, repeated, plus daratumumab at 16 mg/kg given IV once weekly x 9 doses, plus dexamethasone  Noted daratumumab is planned to start this Friday, 12/28/18  Labs from 12/21/18 assessed, OK for treatment change.  Current medication list in Epic reviewed, no significant DDIs with pomalidomide identified. I will ensure patient remains on aspirin and acyclovir.  [place] for [place] [facility]. [facility]  Oral Oncology Clinic will continue to follow for insurance authorization, copayment issues, initial counseling and start date.  JJohny Drilling PharmD, BCPS, BCOP  12/25/2018 8:24 AM Oral Oncology Clinic [phone]

## 2018-12-25 NOTE — Progress Notes (Signed)
43 year old male diagnosed with Multiple Myeloma. He is a patient of Dr. Burr Medico.  PMH includes DM2, HTN.  Medications include Metformin, Calcium/Vitamin D, Humalog, Zofran, Protonix, Miralax, Decadron, Revlamid, Senekot S.  Labs include K 3.4, Glucose 110, and Alb 3.7.  Height: 6'3". Weight: 218 pounds. UBW: 275 pounds June 2020. BMI: 27.25.  Patient reports nausea and vomiting with increased fatigue. He endorses 57 pound weight loss since June. He is trying to eat more and wants to "gain all the weight back". He drinks Glucerna. He is walking 30 min every day.  Nutrition Diagnosis: Unintentional weight loss related to multiple myeloma as evidenced by 21% weight loss over 3 months/significant.  Intervention: Educated to consume 6 meals/snacks daily with protein at [employer]. Educated on consuming healthy plant based diet and limiting concentrated sweets. Reviewed high protein foods. Encouraged Glucerna as tolerated.  Monitoring, Evaluation, Goals: Patient will tolerate increased calories and protein for weight maintenance/weight gain.  Next Visit: Patient to contact me for questions or concerns.

## 2018-12-25 NOTE — Telephone Encounter (Signed)
Oral Oncology Patient Advocate Encounter  Received notification from [employer] that prior authorization for Pomalyst is required.  PA submitted on CoverMyMeds Key [id number] Status is pending  Oral Oncology Clinic will continue to follow.  [place] Patient [place] Phone [phone] Fax [phone] 12/25/2018    10:44 AM

## 2018-12-25 NOTE — Telephone Encounter (Signed)
Oral Oncology Pharmacist Encounter  Received [employer] authorization # for new Pomalyst prescription from collaborative practice RN. Pomalyst prescription has now been e-scribed to [facility] per insurance requirement. Pharmacy contact has been notified on incoming with authorization number; they were previously notified of [employer] authorization approval and Revlimid discontinuation.  Revlimid has been removed from patient's medication list  I will follow-up with patient about new medication regimen tomorrow or Thursday.  [name], PharmD, BCPS, BCOP  12/25/2018 12:56 PM Oral Oncology Clinic [phone]

## 2018-12-25 NOTE — Telephone Encounter (Signed)
Oral Oncology Patient Advocate Encounter  Prior Authorization for Pomlayst has been approved.    PA# [id number] Effective dates: 12/25/18 through 12/25/19  Oral Oncology Clinic will continue to follow.   [place] Patient [place] Phone [phone] Fax [phone] 12/25/2018    10:46 AM

## 2018-12-26 ENCOUNTER — Other Ambulatory Visit: Payer: Self-pay | Admitting: Hematology

## 2018-12-26 DIAGNOSIS — C9000 Multiple myeloma not having achieved remission: Secondary | ICD-10-CM

## 2018-12-26 NOTE — Telephone Encounter (Signed)
Oral Chemotherapy Pharmacist Encounter   Attempted to reach patient to provide update and offer for initial counseling on oral medication: Pomalyst.  No answer.  Left voicemail for patient to call back to discuss details of medication acquisition and initial counseling session.  [name], PharmD, BCPS, BCOP  12/26/2018   10:26 AM Oral Oncology Clinic [phone]

## 2018-12-27 ENCOUNTER — Telehealth: Payer: Self-pay

## 2018-12-27 NOTE — Telephone Encounter (Signed)
Left voice message for this patient to let him know the Tonette Bihari has not been approved yet, will still give Velcade tomorrow and start Dara next Friday, reminded him of his appointments starting at 8:00 am.

## 2018-12-27 NOTE — Telephone Encounter (Signed)
Oral Chemotherapy Pharmacist Encounter   I spoke with patient's wife, Edward Todd, for overview of: Pomalyst (pomalidomide) for the treatment of multiple myeloma in relapse, ISS stage III, in conjunction with daratumumab and dexamethasone, planned duration 2 cycles then proceed to autologous stem cell transplant.   Counseled on administration, dosing, side effects, monitoring, drug-food interactions, safe handling, storage, and disposal.  Patient will take Pomalyst 35m capsules, 1 capsule by mouth once daily, without regard to food, with a full glass of water.  Pomalyst will be given 21 days on, 7 days off, repeat every 28 days.  Patient can take dexamethasone 455mtablets, 1 tablet (54m86mby mouth once weekly with breakfast, to be taken on Thursdays prior to daratumumab infusions planned for Fridays.  Daratumumab will be infused at 16 mg/kg once weekly x 8 planned doses.  Pomalyst and daratumumab start date: 12/28/2018  Adverse effects of Pomalyst include but are not limited to: nausea, constipation, diarrhea, abdominal pain, rash, fatigue, drug fever, peripheral edema, and decreased blood counts.    Reviewed importance of keeping a medication schedule and plan for any missed doses.  Medication reconciliation performed and medication/allergy list updated.  Patient continues on aspirin 81m28m mouth once daily for thromboprophylaxis and acyclovir 400 mg by mouth twice daily for VZV prophylaxis  Insurance authorization for Pomalyst has been obtained.  Pomalyst prescription is being dispensed from [facility] is a limited distribution medication. This is where patient was receiving his Revlimid. They will contact the pharmacy today to schedule shipment so he is able to start it tomorrow.  We have been in touch with the pharmacy and medication is ready to be scheduled for $0 copayment, they just need to speak with patient to schedule shipment.  All questions  answered.  Edward Todd voiced understanding and appreciation.   They know to call the office with questions or concerns.  JessJohny DrillingarmD, BCPS, BCOP  12/27/2018   11:46 AM Oral Oncology Clinic [phone]

## 2018-12-27 NOTE — Telephone Encounter (Signed)
Oral Chemotherapy Pharmacist Encounter   Attempted to reach patient's spouse, Santa Genera, to provide update and offer for initial counseling on oral medication: Pomalyst.  No answer.  Left voicemail to call back to discuss details of medication acquisition and initial counseling session.  [name], PharmD, BCPS, BCOP  12/27/2018   10:56 AM Oral Oncology Clinic [phone]

## 2018-12-28 ENCOUNTER — Other Ambulatory Visit: Payer: Self-pay

## 2018-12-28 ENCOUNTER — Inpatient Hospital Stay (HOSPITAL_BASED_OUTPATIENT_CLINIC_OR_DEPARTMENT_OTHER): Payer: 59 | Admitting: Hematology

## 2018-12-28 ENCOUNTER — Encounter: Payer: Self-pay | Admitting: Hematology

## 2018-12-28 ENCOUNTER — Inpatient Hospital Stay: Payer: 59

## 2018-12-28 VITALS — BP 115/89 | HR 96 | Temp 98.2°F | Resp 18 | Ht 75.0 in | Wt 215.3 lb

## 2018-12-28 DIAGNOSIS — Z79899 Other long term (current) drug therapy: Secondary | ICD-10-CM | POA: Diagnosis not present

## 2018-12-28 DIAGNOSIS — E119 Type 2 diabetes mellitus without complications: Secondary | ICD-10-CM | POA: Diagnosis not present

## 2018-12-28 DIAGNOSIS — C9000 Multiple myeloma not having achieved remission: Secondary | ICD-10-CM

## 2018-12-28 DIAGNOSIS — E1165 Type 2 diabetes mellitus with hyperglycemia: Secondary | ICD-10-CM | POA: Diagnosis not present

## 2018-12-28 DIAGNOSIS — Z7984 Long term (current) use of oral hypoglycemic drugs: Secondary | ICD-10-CM | POA: Diagnosis not present

## 2018-12-28 DIAGNOSIS — Z5112 Encounter for antineoplastic immunotherapy: Secondary | ICD-10-CM | POA: Diagnosis not present

## 2018-12-28 DIAGNOSIS — Z7982 Long term (current) use of aspirin: Secondary | ICD-10-CM | POA: Diagnosis not present

## 2018-12-28 DIAGNOSIS — Z5111 Encounter for antineoplastic chemotherapy: Secondary | ICD-10-CM | POA: Diagnosis not present

## 2018-12-28 DIAGNOSIS — M545 Low back pain: Secondary | ICD-10-CM | POA: Diagnosis not present

## 2018-12-28 DIAGNOSIS — I10 Essential (primary) hypertension: Secondary | ICD-10-CM | POA: Diagnosis not present

## 2018-12-28 DIAGNOSIS — D649 Anemia, unspecified: Secondary | ICD-10-CM | POA: Diagnosis not present

## 2018-12-28 LAB — CMP (CANCER CENTER ONLY)
ALT: 9 U/L (ref 0–44)
AST: 12 U/L — ABNORMAL LOW (ref 15–41)
Albumin: 3.8 g/dL (ref 3.5–5.0)
Alkaline Phosphatase: 72 U/L (ref 38–126)
Anion gap: 12 (ref 5–15)
BUN: 6 mg/dL (ref 6–20)
CO2: 21 mmol/L — ABNORMAL LOW (ref 22–32)
Calcium: 8.5 mg/dL — ABNORMAL LOW (ref 8.9–10.3)
Chloride: 106 mmol/L (ref 98–111)
Creatinine: 0.8 mg/dL (ref 0.61–1.24)
GFR, Est AFR Am: >60 mL/min (ref >60)
GFR, Estimated: >60 mL/min (ref >60)
Glucose, Bld: 132 mg/dL — ABNORMAL HIGH (ref 70–99)
Potassium: 3.3 mmol/L — ABNORMAL LOW (ref 3.5–5.1)
Sodium: 139 mmol/L (ref 135–145)
Total Bilirubin: 0.4 mg/dL (ref 0.3–1.2)
Total Protein: 8.1 g/dL (ref 6.5–8.1)

## 2018-12-28 LAB — CBC WITH DIFFERENTIAL (CANCER CENTER ONLY)
Abs Immature Granulocytes: 0.03 10*3/uL (ref 0.00–0.07)
Basophils Absolute: 0 10*3/uL (ref 0.0–0.1)
Basophils Relative: 0 %
Eosinophils Absolute: 0.1 10*3/uL (ref 0.0–0.5)
Eosinophils Relative: 1 %
HCT: 31 % — ABNORMAL LOW (ref 39.0–52.0)
Hemoglobin: 10.4 g/dL — ABNORMAL LOW (ref 13.0–17.0)
Immature Granulocytes: 1 %
Lymphocytes Relative: 38 %
Lymphs Abs: 2.2 10*3/uL (ref 0.7–4.0)
MCH: 30.2 pg (ref 26.0–34.0)
MCHC: 33.5 g/dL (ref 30.0–36.0)
MCV: 90.1 fL (ref 80.0–100.0)
Monocytes Absolute: 0.4 10*3/uL (ref 0.1–1.0)
Monocytes Relative: 7 %
Neutro Abs: 3.1 10*3/uL (ref 1.7–7.7)
Neutrophils Relative %: 53 %
Platelet Count: 284 10*3/uL (ref 150–400)
RBC: 3.44 MIL/uL — ABNORMAL LOW (ref 4.22–5.81)
RDW: 14.5 % (ref 11.5–15.5)
WBC Count: 5.9 10*3/uL (ref 4.0–10.5)
nRBC: 0 % (ref 0.0–0.2)

## 2018-12-28 LAB — SAMPLE TO BLOOD BANK

## 2018-12-28 LAB — TYPE AND SCREEN
ABO/RH(D): O POS
Antibody Screen: NEGATIVE

## 2018-12-28 LAB — ABO/RH: ABO/RH(D): O POS

## 2018-12-28 MED ORDER — BORTEZOMIB CHEMO SQ INJECTION 3.5 MG (2.5MG/ML)
1.3000 mg/m2 | Freq: Once | INTRAMUSCULAR | Status: AC
  Administered 2018-12-28: 3 mg via SUBCUTANEOUS
  Filled 2018-12-28: qty 1.2

## 2018-12-28 MED ORDER — PROCHLORPERAZINE MALEATE 10 MG PO TABS
10.0000 mg | ORAL_TABLET | Freq: Once | ORAL | Status: AC
  Administered 2018-12-28: 10 mg via ORAL

## 2018-12-28 MED ORDER — SODIUM CHLORIDE 0.9 % IV SOLN
Freq: Once | INTRAVENOUS | Status: AC
  Administered 2018-12-28: 09:00:00 via INTRAVENOUS
  Filled 2018-12-28: qty 250

## 2018-12-28 MED ORDER — ZOLEDRONIC ACID 4 MG/100ML IV SOLN
4.0000 mg | Freq: Once | INTRAVENOUS | Status: AC
  Administered 2018-12-28: 10:00:00 4 mg via INTRAVENOUS
  Filled 2018-12-28: qty 100

## 2018-12-28 MED ORDER — PROCHLORPERAZINE MALEATE 10 MG PO TABS
ORAL_TABLET | ORAL | Status: AC
  Filled 2018-12-28: qty 1

## 2018-12-28 NOTE — Progress Notes (Signed)
Confirmed Zometa treatment today with MD. Zometa last given 12/14/18. Ca 8.5 and Albumin 3.8 today. Proceed with Zometa treatment.  Hardie Pulley, PharmD, BCPS, BCOP

## 2018-12-28 NOTE — Patient Instructions (Signed)
[facility] Discharge Instructions for Patients Receiving Chemotherapy  Today you received the following chemotherapy agents: Bortezomib (Velcade) & Zometa   To help prevent nausea and vomiting after your treatment, we encourage you to take your nausea medication as directed.   If you develop nausea and vomiting that is not controlled by your nausea medication, call the clinic.   BELOW ARE SYMPTOMS THAT SHOULD BE REPORTED IMMEDIATELY:  *FEVER GREATER THAN 100.5 F  *CHILLS WITH OR WITHOUT FEVER  NAUSEA AND VOMITING THAT IS NOT CONTROLLED WITH YOUR NAUSEA MEDICATION  *UNUSUAL SHORTNESS OF BREATH  *UNUSUAL BRUISING OR BLEEDING  TENDERNESS IN MOUTH AND THROAT WITH OR WITHOUT PRESENCE OF ULCERS  *URINARY PROBLEMS  *BOWEL PROBLEMS  UNUSUAL RASH Items with * indicate a potential emergency and should be followed up as soon as possible.  Feel free to call the clinic should you have any questions or concerns. The clinic phone number is [phone].  Please show the [facility] at check-in to the Emergency Department and triage nurse.  Coronavirus (COVID-19) Are you at risk?  Are you at risk for the Coronavirus (COVID-19)?  To be considered HIGH RISK for Coronavirus (COVID-19), you have to meet the following criteria:  . Traveled to [place], [place], [place], [place] or [place]; or in the [place] to [place], [place], [place], or [place]; and have fever, cough, and shortness of breath within the last 2 weeks of travel OR . Been in close contact with a person diagnosed with COVID-19 within the last 2 weeks and have fever, cough, and shortness of breath . IF YOU DO NOT MEET THESE CRITERIA, YOU ARE CONSIDERED LOW RISK FOR COVID-19.  What to do if you are HIGH RISK for COVID-19?  Marland Kitchen If you are having a medical emergency, call 911. . Seek medical care right away. Before you go to a doctor's office, urgent care or emergency department, call ahead and tell  them about your recent travel, contact with someone diagnosed with COVID-19, and your symptoms. You should receive instructions from your physician's office regarding next steps of care.  . When you arrive at healthcare provider, tell the healthcare staff immediately you have returned from visiting [place], [place], [place], [place] or [place]; or traveled in the [place] to [place], [place], [place], or [place]; in the last two weeks or you have been in close contact with a person diagnosed with COVID-19 in the last 2 weeks.   . Tell the health care staff about your symptoms: fever, cough and shortness of breath. . After you have been seen by a medical provider, you will be either: o Tested for (COVID-19) and discharged home on quarantine except to seek medical care if symptoms worsen, and asked to  - Stay home and avoid contact with others until you get your results (4-5 days)  - Avoid travel on public transportation if possible (such as bus, train, or airplane) or o Sent to the Emergency Department by EMS for evaluation, COVID-19 testing, and possible admission depending on your condition and test results.  What to do if you are LOW RISK for COVID-19?  Reduce your risk of any infection by using the same precautions used for avoiding the common cold or flu:  Marland Kitchen Wash your hands often with soap and warm water for at least 20 seconds.  If soap and water are not readily available, use an alcohol-based hand sanitizer with at least 60% alcohol.  Marland Kitchen  If coughing or sneezing, cover your mouth and nose by coughing or sneezing into the elbow areas of your shirt or coat, into a tissue or into your sleeve (not your hands). . Avoid shaking hands with others and consider head nods or verbal greetings only. . Avoid touching your eyes, nose, or mouth with unwashed hands.  . Avoid close contact with people who are sick. . Avoid places or events with large numbers of people in one location, like concerts or  sporting events. . Carefully consider travel plans you have or are making. . If you are planning any travel outside or inside the [place], visit the CDC's Travelers' Health webpage for the latest health notices. . If you have some symptoms but not all symptoms, continue to monitor at home and seek medical attention if your symptoms worsen. . If you are having a medical emergency, call 911.   [place] / e-Visit: [email or web address]         [facility]: [facility]: [phone]                   [facility]: [phone]

## 2018-12-29 ENCOUNTER — Other Ambulatory Visit: Payer: Self-pay | Admitting: Hematology

## 2018-12-29 LAB — PRETREATMENT RBC PHENOTYPE

## 2018-12-29 MED ORDER — POTASSIUM CHLORIDE 20 MEQ PO PACK: 20.0000 meq | PACK | Freq: Every day | ORAL | 1 refills | Status: DC

## 2019-01-01 ENCOUNTER — Telehealth: Payer: Self-pay

## 2019-01-01 LAB — UIFE/LIGHT CHAINS/TP QN, 24-HR UR
FR KAPPA LT CH,24HR: 78.24 mg/24 hr
FR LAMBDA LT CH,24HR: 4.46 mg/24 hr
Free Kappa Lt Chains,Ur: 42.29 mg/L (ref 0.63–113.79)
Free Kappa/Lambda Ratio: 17.55 (ref 1.03–31.76)
Free Lambda Lt Chains,Ur: 2.41 mg/L (ref 0.47–11.77)
Total Protein, Urine-Ur/day: 385 mg/24 hr — ABNORMAL HIGH (ref 30–150)
Total Protein, Urine: 20.8 mg/dL
Total Volume: 1850

## 2019-01-01 NOTE — Telephone Encounter (Signed)
Left voice message regarding lab results.  Per Dr. Burr Medico notified him potassium and calcium are low.  She has sent in KCL 20 meq please take one daily.  Also take Calcium/Vitamin D supplement one twice daily.  Encouraged him to call back if he has questions.

## 2019-01-01 NOTE — Telephone Encounter (Signed)
-----   Message from Truitt Merle, MD sent at 12/29/2018 10:44 AM EDT ----- Please make sure pt know his K and calcium are low, I have called in KCL 64meq daily today, make sure he is taking, and increase calcium/vitD to 1 tab twice daily, thanks   Truitt Merle  12/29/2018

## 2019-01-02 NOTE — Telephone Encounter (Signed)
Oral Oncology Patient Advocate Encounter  Confirmed with [name] that Pomlayst was delivered on 9/4 with a $0 copay.  [facility] [place] Phone [phone] Fax [phone] 01/02/2019   3:03 PM

## 2019-01-04 ENCOUNTER — Other Ambulatory Visit: Payer: Self-pay

## 2019-01-04 ENCOUNTER — Inpatient Hospital Stay: Payer: 59

## 2019-01-04 ENCOUNTER — Other Ambulatory Visit: Payer: Self-pay | Admitting: Hematology

## 2019-01-04 ENCOUNTER — Telehealth: Payer: Self-pay | Admitting: *Deleted

## 2019-01-04 VITALS — BP 136/107 | HR 80 | Temp 98.4°F | Resp 18

## 2019-01-04 DIAGNOSIS — Z5112 Encounter for antineoplastic immunotherapy: Secondary | ICD-10-CM | POA: Diagnosis not present

## 2019-01-04 DIAGNOSIS — C9000 Multiple myeloma not having achieved remission: Secondary | ICD-10-CM

## 2019-01-04 LAB — CBC WITH DIFFERENTIAL (CANCER CENTER ONLY)
Abs Immature Granulocytes: 0.02 10*3/uL (ref 0.00–0.07)
Basophils Absolute: 0 10*3/uL (ref 0.0–0.1)
Basophils Relative: 0 %
Eosinophils Absolute: 0 10*3/uL (ref 0.0–0.5)
Eosinophils Relative: 0 %
HCT: 32.6 % — ABNORMAL LOW (ref 39.0–52.0)
Hemoglobin: 11.1 g/dL — ABNORMAL LOW (ref 13.0–17.0)
Immature Granulocytes: 0 %
Lymphocytes Relative: 27 %
Lymphs Abs: 1.3 10*3/uL (ref 0.7–4.0)
MCH: 30.5 pg (ref 26.0–34.0)
MCHC: 34 g/dL (ref 30.0–36.0)
MCV: 89.6 fL (ref 80.0–100.0)
Monocytes Absolute: 0.1 10*3/uL (ref 0.1–1.0)
Monocytes Relative: 3 %
Neutro Abs: 3.2 10*3/uL (ref 1.7–7.7)
Neutrophils Relative %: 70 %
Platelet Count: 228 10*3/uL (ref 150–400)
RBC: 3.64 MIL/uL — ABNORMAL LOW (ref 4.22–5.81)
RDW: 14.3 % (ref 11.5–15.5)
WBC Count: 4.6 10*3/uL (ref 4.0–10.5)
nRBC: 0 % (ref 0.0–0.2)

## 2019-01-04 LAB — CMP (CANCER CENTER ONLY)
ALT: 6 U/L (ref 0–44)
AST: 9 U/L — ABNORMAL LOW (ref 15–41)
Albumin: 3.8 g/dL (ref 3.5–5.0)
Alkaline Phosphatase: 71 U/L (ref 38–126)
Anion gap: 9 (ref 5–15)
BUN: 11 mg/dL (ref 6–20)
CO2: 20 mmol/L — ABNORMAL LOW (ref 22–32)
Calcium: 8.6 mg/dL — ABNORMAL LOW (ref 8.9–10.3)
Chloride: 108 mmol/L (ref 98–111)
Creatinine: 0.79 mg/dL (ref 0.61–1.24)
GFR, Est AFR Am: >60 mL/min (ref >60)
GFR, Estimated: >60 mL/min (ref >60)
Glucose, Bld: 157 mg/dL — ABNORMAL HIGH (ref 70–99)
Potassium: 4.2 mmol/L (ref 3.5–5.1)
Sodium: 137 mmol/L (ref 135–145)
Total Bilirubin: 0.4 mg/dL (ref 0.3–1.2)
Total Protein: 8 g/dL (ref 6.5–8.1)

## 2019-01-04 MED ORDER — MONTELUKAST SODIUM 10 MG PO TABS
10.0000 mg | ORAL_TABLET | Freq: Once | ORAL | Status: AC
  Administered 2019-01-04: 10 mg via ORAL

## 2019-01-04 MED ORDER — MONTELUKAST SODIUM 10 MG PO TABS
ORAL_TABLET | ORAL | Status: AC
  Filled 2019-01-04: qty 1

## 2019-01-04 MED ORDER — SODIUM CHLORIDE 0.9 % IV SOLN
Freq: Once | INTRAVENOUS | Status: AC
  Administered 2019-01-04: 10:00:00 via INTRAVENOUS
  Filled 2019-01-04: qty 250

## 2019-01-04 MED ORDER — SODIUM CHLORIDE 0.9 % IV SOLN
20.0000 mg | Freq: Once | INTRAVENOUS | Status: AC
  Administered 2019-01-04: 20 mg via INTRAVENOUS
  Filled 2019-01-04: qty 20

## 2019-01-04 MED ORDER — DIPHENHYDRAMINE HCL 25 MG PO CAPS
50.0000 mg | ORAL_CAPSULE | Freq: Once | ORAL | Status: AC
  Administered 2019-01-04: 50 mg via ORAL

## 2019-01-04 MED ORDER — SODIUM CHLORIDE 0.9 % IV SOLN
16.2000 mg/kg | Freq: Once | INTRAVENOUS | Status: AC
  Administered 2019-01-04: 1600 mg via INTRAVENOUS
  Filled 2019-01-04: qty 80

## 2019-01-04 MED ORDER — ACETAMINOPHEN 325 MG PO TABS
650.0000 mg | ORAL_TABLET | Freq: Once | ORAL | Status: AC
  Administered 2019-01-04: 10:00:00 650 mg via ORAL

## 2019-01-04 MED ORDER — DIPHENHYDRAMINE HCL 25 MG PO CAPS
ORAL_CAPSULE | ORAL | Status: AC
  Filled 2019-01-04: qty 2

## 2019-01-04 MED ORDER — ACETAMINOPHEN 325 MG PO TABS
ORAL_TABLET | ORAL | Status: AC
  Filled 2019-01-04: qty 2

## 2019-01-04 NOTE — Patient Instructions (Signed)
Daratumumab injection What is this medicine? DARATUMUMAB (dar a toom ue mab) is a monoclonal antibody. It is used to treat multiple myeloma. This medicine may be used for other purposes; ask your health care provider or pharmacist if you have questions. COMMON BRAND NAME(S): DARZALEX What should I tell my health care provider before I take this medicine? They need to know if you have any of these conditions:  infection (especially a virus infection such as chickenpox, herpes, or hepatitis B virus)  lung or breathing disease  an unusual or allergic reaction to daratumumab, other medicines, foods, dyes, or preservatives  pregnant or trying to get pregnant  breast-feeding How should I use this medicine? This medicine is for infusion into a vein. It is given by a health care professional in a hospital or clinic setting. Talk to your pediatrician regarding the use of this medicine in children. Special care may be needed. Overdosage: If you think you have taken too much of this medicine contact a poison control center or emergency room at once. NOTE: This medicine is only for you. Do not share this medicine with others. What if I miss a dose? Keep appointments for follow-up doses as directed. It is important not to miss your dose. Call your doctor or health care professional if you are unable to keep an appointment. What may interact with this medicine? Interactions have not been studied. This list may not describe all possible interactions. Give your health care provider a list of all the medicines, herbs, non-prescription drugs, or dietary supplements you use. Also tell them if you smoke, drink alcohol, or use illegal drugs. Some items may interact with your medicine. What should I watch for while using this medicine? This drug may make you feel generally unwell. Report any side effects. Continue your course of treatment even though you feel ill unless your doctor tells you to stop. This  medicine can cause serious allergic reactions. To reduce your risk you may need to take medicine before treatment with this medicine. Take your medicine as directed. This medicine can affect the results of blood tests to match your blood type. These changes can last for up to 6 months after the final dose. Your healthcare provider will do blood tests to match your blood type before you start treatment. Tell all of your healthcare providers that you are being treated with this medicine before receiving a blood transfusion. This medicine can affect the results of some tests used to determine treatment response; extra tests may be needed to evaluate response. Do not become pregnant while taking this medicine or for 3 months after stopping it. Women should inform their doctor if they wish to become pregnant or think they might be pregnant. There is a potential for serious side effects to an unborn child. Talk to your health care professional or pharmacist for more information. What side effects may I notice from receiving this medicine? Side effects that you should report to your doctor or health care professional as soon as possible:  allergic reactions like skin rash, itching or hives, swelling of the face, lips, or tongue  breathing problems  chills  cough  dizziness  feeling faint or lightheaded  headache  low blood counts - this medicine may decrease the number of white blood cells, red blood cells and platelets. You may be at increased risk for infections and bleeding.  nausea, vomiting  shortness of breath  signs of decreased platelets or bleeding - bruising, pinpoint red spots on  the skin, black, tarry stools, blood in the urine  signs of decreased red blood cells - unusually weak or tired, feeling faint or lightheaded, falls  signs of infection - fever or chills, cough, sore throat, pain or difficulty passing urine  signs and symptoms of liver injury like dark yellow or brown  urine; general ill feeling or flu-like symptoms; light-colored stools; loss of appetite; right upper belly pain; unusually weak or tired; yellowing of the eyes or skin Side effects that usually do not require medical attention (report to your doctor or health care professional if they continue or are bothersome):  back pain  constipation  loss of appetite  diarrhea  joint pain  muscle cramps  pain, tingling, numbness in the hands or feet  swelling of the ankles, feet, hands  tiredness  trouble sleeping This list may not describe all possible side effects. Call your doctor for medical advice about side effects. You may report side effects to FDA at 1-800-FDA-1088. Where should I keep my medicine? Keep out of the reach of children. This drug is given in a hospital or clinic and will not be stored at home. NOTE: This sheet is a summary. It may not cover all possible information. If you have questions about this medicine, talk to your doctor, pharmacist, or health care provider.  2020 Elsevier/Gold Standard (2018-01-25 14:00:48)  [facility] Discharge Instructions for Patients Receiving Chemotherapy  Today you received the following chemotherapy agents Darzalex  To help prevent nausea and vomiting after your treatment, we encourage you to take your nausea medication as directed.    If you develop nausea and vomiting that is not controlled by your nausea medication, call the clinic.   BELOW ARE SYMPTOMS THAT SHOULD BE REPORTED IMMEDIATELY:  *FEVER GREATER THAN 100.5 F  *CHILLS WITH OR WITHOUT FEVER  NAUSEA AND VOMITING THAT IS NOT CONTROLLED WITH YOUR NAUSEA MEDICATION  *UNUSUAL SHORTNESS OF BREATH  *UNUSUAL BRUISING OR BLEEDING  TENDERNESS IN MOUTH AND THROAT WITH OR WITHOUT PRESENCE OF ULCERS  *URINARY PROBLEMS  *BOWEL PROBLEMS  UNUSUAL RASH Items with * indicate a potential emergency and should be followed up as soon as possible.  Feel free to  call the clinic should you have any questions or concerns. The clinic phone number is [phone].  Please show the [facility] at check-in to the Emergency Department and triage nurse.

## 2019-01-04 NOTE — Telephone Encounter (Signed)
Records faxed to disability - att Opal Sidles - Release [id number]

## 2019-01-04 NOTE — Progress Notes (Signed)
Per MD Burr Medico, ok to stop Darzalex infusion when infusion room is done with chemo today.

## 2019-01-05 ENCOUNTER — Other Ambulatory Visit: Payer: Self-pay | Admitting: Hematology

## 2019-01-07 NOTE — Progress Notes (Signed)
[place]   [phone] [phone]   Clinic Follow up Note   Patient Care Team: London Pepper, MD as PCP - General (Family Medicine)  Date of Service:  01/11/2019  CHIEF COMPLAINT: F/u of MM  SUMMARY OF ONCOLOGIC HISTORY: Oncology History Overview Note  Cancer Staging Multiple myeloma ([place]) Staging form: Plasma Cell Myeloma and Plasma Cell Disorders, AJCC 8th Edition - Clinical stage from 09/04/2018: RISS Stage III (Beta-2-microglobulin (mg/L): 5.5, Albumin (g/dL): 2.6, ISS: Stage III, High-risk cytogenetics: Absent, LDH: Elevated) - Signed by Truitt Merle, MD on 09/26/2018     Multiple myeloma ([place])  08/31/2018 Initial Diagnosis   Multiple myeloma ([place])   09/04/2018 Cancer Staging   Staging form: Plasma Cell Myeloma and Plasma Cell Disorders, AJCC 8th Edition - Clinical stage from 09/04/2018: RISS Stage III (Beta-2-microglobulin (mg/L): 5.5, Albumin (g/dL): 2.6, ISS: Stage III, High-risk cytogenetics: Absent, LDH: Elevated) - Signed by Truitt Merle, MD on 09/26/2018   09/04/2018 Initial Biopsy   Diagnosis 09/04/18 Bone Marrow, Aspirate,Biopsy, and Clot, left posterior iliac crest BONE MARROW: - CELLULAR MARROW WITH INVOLVEMENT BY PLASMA CELL NEOPLASM (>95%) - SEE COMMENT PERIPHERAL BLOOD: - NORMOCYTIC ANEMIA - ROULEAUX FORMATION - SEE COMPLETE BLOOD COUNT   09/05/2018 - 12/28/2018 Chemotherapy   Weekly velcade injection and dexamethasone 50m starting 09/05/18             -increase Velcade injection to twice a week (Fridays/Mondays) for 2 weeks on, 1 week off starting 10/19/18. Will return to weekly Velcade starting 11/09/18.              -Dexa stopped 12/14/18 due to hyperglycemia. Restart on 9/4 at 262mWeekly.              -Stop Velcade and change Dexa after 12/28/18.    09/14/2018 - 12/21/2018 Chemotherapy   Revlimid 257m weeks on/1 week off starting 09/14/18. Stopped 12/21/18 due to abdominal pain.     12/29/2018 -  Chemotherapy   Pomalyst 4mg61mweeks  on/ 1 week off starting 12/29/18.    01/04/2019 -  Chemotherapy   IV weekly Daratumumab and oral Dexa 8mg 76m before infusion starting 01/04/19      CURRENT THERAPY:  1. Weekly velcade injection and dexamethasone 40mg 84mting 09/05/18,Revlimid 25mgda40m weeks onand 1 week off starting 09/14/18.  -increase Velcade injection to twice a week (Fridays/Mondays) for 2 weeks on, 1 week off starting 10/19/18. Will return to weekly Velcade starting 11/09/18. -Dexa stopped 12/14/18 due to hyperglycemia. Will restart on 9/4 at 20mg we16m.  -Due to Abdominal pain, Revlimid stopped 12/21/18.              -Stop Velcade and change Dexa after 12/28/18.  2. MonthlyZometastarted on 09/05/2018 3. IV weekly Daratumumab and oral Dexa 8mg day 55more infusion starting 01/04/19, Pomalyst 4mg 3 wee34mon/ 1 week off starting 12/29/18.   INTERVAL HISTORY:  Edward Todd a follow up and treatment. He presents to the clinic alone. He notes he started Dara and tolerated well with minor aches and pain after the infusion. That lasted for only 2 days. He denies rash or swelling. He notes he has tolerated Pomalyst with no issues. He has been taking Dexa 2 days before infusion which has helped his energy and appetite. He notes he takes calcium BID and Vitamin D.   He notes he has been taking Oxycodone 10mg which85m get him through 12 hours. He would like to reduce to 5mg. He not9m  the 8/28 refill was not there at [facility].  He notes he has $5000 balance open with clinic. He plans to follow up with financial advocates about it.     REVIEW OF SYSTEMS:   Constitutional: Denies fevers, chills or abnormal weight loss Eyes: Denies blurriness of vision Ears, nose, mouth, throat, and face: Denies mucositis or sore throat Respiratory: Denies cough, dyspnea or wheezes Cardiovascular: Denies palpitation, chest discomfort or lower extremity swelling Gastrointestinal:  Denies nausea,  heartburn or change in bowel habits Skin: Denies abnormal skin rashes Lymphatics: Denies new lymphadenopathy or easy bruising Neurological:Denies numbness, tingling or new weaknesses Behavioral/Psych: Mood is stable, no new changes  All other systems were reviewed with the patient and are negative.  MEDICAL HISTORY:  Past Medical History:  Diagnosis Date  . Cancer ([place])   . Diabetes mellitus without complication ([facility] [place])   . Hypertension     SURGICAL HISTORY: History reviewed. No pertinent surgical history.  I have reviewed the social history and family history with the patient and they are unchanged from previous note.  ALLERGIES:  is allergic to shellfish allergy.  MEDICATIONS:  Current Outpatient Medications  Medication Sig Dispense Refill  . acetaminophen (TYLENOL) 325 MG tablet Take 2 tablets (650 mg total) by mouth every 6 (six) hours as needed for mild pain or fever.    Marland Kitchen acetaminophen (TYLENOL) 500 MG tablet Take 500 mg by mouth every 6 (six) hours as needed for mild pain.    Marland Kitchen acyclovir (ZOVIRAX) 400 MG tablet TAKE 1 TABLET BY MOUTH TWICE A DAY 60 tablet 0  . aspirin 81 MG chewable tablet Chew 1 tablet (81 mg total) by mouth daily.    . calcium-vitamin D (OSCAL WITH D) 500-200 MG-UNIT tablet Take 1 tablet by mouth 2 (two) times daily. 60 tablet 1  . Continuous Blood Gluc Receiver (FREESTYLE LIBRE 14 DAY READER) DEVI See admin instructions.    . Continuous Blood Gluc Sensor (FREESTYLE LIBRE 14 DAY SENSOR) MISC APPLY EVERY 14 DAYS    . dexamethasone (DECADRON) 4 MG tablet Take 5 tablets (20 mg total) by mouth every Friday. 20 tablet 2  . HUMALOG KWIKPEN 100 UNIT/ML KwikPen Inject 0-10 Units into the skin 2 (two) times daily. Per sliding scale, Hold if BS <120    . Melatonin 3 MG CAPS Take 1 capsule (3 mg total) by mouth at bedtime as needed (sleep). 30 capsule 0  . metFORMIN (GLUCOPHAGE) 1000 MG tablet TAKE 1 TABLET (1,000 MG TOTAL) BY MOUTH 2 (TWO) TIMES DAILY WITH A MEAL. 60  tablet 0  . methocarbamol (ROBAXIN) 750 MG tablet Take 1 tablet (750 mg total) by mouth 3 (three) times daily. (Patient taking differently: Take 750 mg by mouth 3 (three) times daily as needed for muscle spasms. ) 90 tablet 2  . metoCLOPramide (REGLAN) 10 MG tablet Take 1 tablet (10 mg total) by mouth every 8 (eight) hours as needed for nausea. 20 tablet 0  . ondansetron (ZOFRAN-ODT) 4 MG disintegrating tablet Take 1 tablet (4 mg total) by mouth every 6 (six) hours as needed for nausea or vomiting. 20 tablet 0  . ONETOUCH VERIO test strip USE TO CHECK BLOOD SUGAR 3 TIMES A DAY    . OxyCODONE HCl, Abuse Deter, (OXAYDO) 5 MG TABA Take 5 mg by mouth every 8 (eight) hours as needed. 70 tablet 0  . pantoprazole (PROTONIX) 40 MG tablet TAKE 1 TABLET (40 MG TOTAL) BY MOUTH DAILY AT 6 (SIX) AM. 30 tablet 0  .  polyethylene glycol (MIRALAX / GLYCOLAX) 17 g packet Take 17 g by mouth daily. (Patient taking differently: Take 17 g by mouth daily as needed for mild constipation. ) 14 each 0  . pomalidomide (POMALYST) 4 MG capsule Take 1 capsule (4 mg total) by mouth daily. Take with water on days 1-21 of each 28 day cycle. Celgene# [id number], obtained 12/25/2018 21 capsule 0  . potassium chloride (KLOR-CON) 20 MEQ packet Take 20 mEq by mouth daily. 30 packet 1  . senna-docusate (SENOKOT-S) 8.6-50 MG tablet Take 1 tablet by mouth 2 (two) times daily. (Patient taking differently: Take 1 tablet by mouth 2 (two) times daily as needed for mild constipation. )     No current facility-administered medications for this visit.     PHYSICAL EXAMINATION: ECOG PERFORMANCE STATUS: 3 - Symptomatic, >50% confined to bed  Vitals:   01/11/19 0848  BP: 125/73  Pulse: 88  Resp: 18  Temp: 98.3 F (36.8 C)  SpO2: 100%   Filed Weights   01/11/19 0848  Weight: 215 lb 8 oz (97.8 kg)    GENERAL:alert, no distress and comfortable SKIN: skin color, texture, turgor are normal, no rashes or significant lesions EYES: normal,  Conjunctiva are pink and non-injected, sclera clear  NECK: supple, thyroid normal size, non-tender, without nodularity LYMPH:  no palpable lymphadenopathy in the cervical, axillary  LUNGS: clear to auscultation and percussion with normal breathing effort HEART: regular rate & rhythm and no murmurs and no lower extremity edema ABDOMEN:abdomen soft, non-tender and normal bowel sounds Musculoskeletal:no cyanosis of digits and no clubbing  NEURO: alert & oriented x 3 with fluent speech, no focal motor/sensory deficits  LABORATORY DATA:  I have reviewed the data as listed CBC Latest Ref Rng & Units 01/11/2019 01/04/2019 12/28/2018  WBC 4.0 - 10.5 K/uL 1.5(L) 4.6 5.9  Hemoglobin 13.0 - 17.0 g/dL 10.9(L) 11.1(L) 10.4(L)  Hematocrit 39.0 - 52.0 % 32.1(L) 32.6(L) 31.0(L)  Platelets 150 - 400 K/uL 188 228 284     CMP Latest Ref Rng & Units 01/11/2019 01/04/2019 12/28/2018  Glucose 70 - 99 mg/dL 194(H) 157(H) 132(H)  BUN 6 - 20 mg/dL _0 Creatinine 0.61 - 1.24 mg/dL 0.79 0.79 0.80  Sodium 135 - 145 mmol/L 139 137 139  Potassium 3.5 - 5.1 mmol/L 4.2 4.2 3.3(L)  Chloride 98 - 111 mmol/L 107 108 106  CO2 22 - 32 mmol/L 21(L) 20(L) 21(L)  Calcium 8.9 - 10.3 mg/dL 9.0 8.6(L) 8.5(L)  Total Protein 6.5 - 8.1 g/dL 7.8 8.0 8.1  Total Bilirubin 0.3 - 1.2 mg/dL 0.4 0.4 0.4  Alkaline Phos 38 - 126 U/L 66 71 72  AST 15 - 41 U/L 9(L) 9(L) 12(L)  ALT 0 - 44 U/L _1 RADIOGRAPHIC STUDIES: I have personally reviewed the radiological images as listed and agreed with the findings in the report. No results found.   ASSESSMENT & PLAN:  Edward Todd is a 43 y.o. male with   1. 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 Work upconfirmed multiple myeloma, unfortunately he has multiple compression fracture of thoracic and lumbar spine. -I started him oninduction chemo with QMV:HQIONG Velcade injections with  Dexamethasonestartedon 09/05/18 and oral Revlimid on 09/14/18.He could not tolerated Velcade injection twice a week -His cytogeneticsand FISHresults showed trisomy 11, FISH panel otherwise negative, this is considered a standard risk.  -He has been on first line  chemo VRD, due to poor tolerance to revlimid, I stopped it in August 2020. -He met with Dr. Norma Fredrickson who plans to move forward with bone marrow transplant after about 2 months. He recommend change his treatment to Daratumumab, Dexa and Pomalyst.  -I recommended port placement, he declined  -He has tolerated the start of Pomalyst and Dara well with only minor aches and pain for 2 days.  -Labs reviewed, CBC and CMP WNL except WBC 1.5, Hg 10.9, ANC 0.9, BG 194. Overall adequate to proceed with Dara today and continue weekly.  -We discussed Dara injection versus infusion. He is interested, will get his insurance approval first. -Continue Pomalyst 25m 3 weeks on and 1 week off. Continue Dexa 822mday before dara infusion and 2072mn day of infusion. Continue Zometa every 4 weeks.  -he has developed mild neutropenia, will monitor -Watch for signs of infection such as fever, chills. If neutropenia worsens will change Pomalyst to 4mg61mweeks on/1 week off.  -F/u in 1 week   2. Anemia, secondary to MM -He required blood transfusion 09/12/18. -On Oral iron -Labs reviewed, Hg at10.9today (9/18//20), overall much improved since he started MM treatment -Continue to monitor.   3. Severe low back pain  -secondary to thoracic and lumbar compression fractures,lumbar spine stenosisseen on 08/31/18 bone survey.  -He has completed rehab and currently doing PT. -He is on Robaxin 750mg53m oxycodone 10mg359mimes a dayas needed which is mostly controlling his pain.  -He will continuestool softeners as needed to prevent constipation from pain meds.  -His peripheral strength and ambulationcontinues to slowlyimprove.He will continue PT. He  is now able to ambulate for 30 minutes at a time. -His back pain is occasional, but mostly gone. He notes he continues to reduce his oxycodone10mg e65m 12 hours. He would like to reduce to 5mg q8h84ms. If manageable then he can reduce to 5mg q12h56ms, then 2.5mg, ect.13m can reduce 1-2 weeks at a time until he weens off.  -He will continue to slowly wean off if tolerable -Hewill continue tof/uwithorthopedicsDr. Yates.   4Lorin Mercy Type II, hyperglycemia -On Metformin BID -I againencouraged him to reduce carbohydrates, juice, soft drinks in diet. -He is being seen by Dr. Ellison. HLoanne Drillingugar monitor placed on right arm. He is currently on no carb diet and will continue metformin.  -I encouraged him to watch his weight. He can increase protein and calorie to avoid losing too much weight. He understands. -I will hold Dexaon 12/14/18 and restarted on 9/4.  -BG at194 today (01/11/19).overall much improved. Calcium level normal today. He will continue calcium BID and Vitamin D 1000 units.   5. Financial support -I offered [facility] that are available to him. -He doesnot haveinsurance and hehas applied fordisability.I gave him letter about his disability for 1 year.  -I recommend he see advocate Lenise WhiAnnamary [place]umm[place]eng department about help with his medical bills.    PLAN: -I refilled Oxycodone today, he will try to wean off  -Labs reviewed and adequate to proceed with Dara today and weekly  -continueZometa every 3 months and Pomalyst 4mg 3 week73mn/1 week off  -Lab, f/u, Dara in 1 week  -He requested medical records copy, will send message to HIM     No problem-specific Assessment & Plan notes found for this encounter.   No orders of the defined types were placed in this encounter.  All questions were answered. The patient knows to call the clinic with any  problems, questions or concerns. No barriers to learning was detected. I spent 20 minutes  counseling the patient face to face. The total time spent in the appointment was 25 minutes and more than 50% was on counseling and review of test results     Truitt Merle, MD 01/11/2019   I, Joslyn Devon, am acting as scribe for Truitt Merle, MD.   I have reviewed the above documentation for accuracy and completeness, and I agree with the above.

## 2019-01-11 ENCOUNTER — Encounter: Payer: Self-pay | Admitting: Hematology

## 2019-01-11 ENCOUNTER — Other Ambulatory Visit: Payer: Self-pay

## 2019-01-11 ENCOUNTER — Inpatient Hospital Stay: Payer: 59

## 2019-01-11 ENCOUNTER — Ambulatory Visit: Payer: 59

## 2019-01-11 ENCOUNTER — Inpatient Hospital Stay (HOSPITAL_BASED_OUTPATIENT_CLINIC_OR_DEPARTMENT_OTHER): Payer: 59 | Admitting: Hematology

## 2019-01-11 VITALS — BP 141/90 | HR 86 | Temp 98.9°F | Resp 18

## 2019-01-11 VITALS — BP 125/73 | HR 88 | Temp 98.3°F | Resp 18 | Ht 75.0 in | Wt 215.5 lb

## 2019-01-11 DIAGNOSIS — Z5112 Encounter for antineoplastic immunotherapy: Secondary | ICD-10-CM | POA: Diagnosis not present

## 2019-01-11 DIAGNOSIS — C9000 Multiple myeloma not having achieved remission: Secondary | ICD-10-CM

## 2019-01-11 LAB — CBC WITH DIFFERENTIAL (CANCER CENTER ONLY)
Abs Immature Granulocytes: 0.01 10*3/uL (ref 0.00–0.07)
Basophils Absolute: 0 10*3/uL (ref 0.0–0.1)
Basophils Relative: 0 %
Eosinophils Absolute: 0 10*3/uL (ref 0.0–0.5)
Eosinophils Relative: 0 %
HCT: 32.1 % — ABNORMAL LOW (ref 39.0–52.0)
Hemoglobin: 10.9 g/dL — ABNORMAL LOW (ref 13.0–17.0)
Immature Granulocytes: 1 %
Lymphocytes Relative: 33 %
Lymphs Abs: 0.5 10*3/uL — ABNORMAL LOW (ref 0.7–4.0)
MCH: 30.4 pg (ref 26.0–34.0)
MCHC: 34 g/dL (ref 30.0–36.0)
MCV: 89.7 fL (ref 80.0–100.0)
Monocytes Absolute: 0.1 10*3/uL (ref 0.1–1.0)
Monocytes Relative: 5 %
Neutro Abs: 0.9 10*3/uL — ABNORMAL LOW (ref 1.7–7.7)
Neutrophils Relative %: 61 %
Platelet Count: 188 10*3/uL (ref 150–400)
RBC: 3.58 MIL/uL — ABNORMAL LOW (ref 4.22–5.81)
RDW: 14.3 % (ref 11.5–15.5)
WBC Count: 1.5 10*3/uL — ABNORMAL LOW (ref 4.0–10.5)
nRBC: 0 % (ref 0.0–0.2)

## 2019-01-11 LAB — CMP (CANCER CENTER ONLY)
ALT: 16 U/L (ref 0–44)
AST: 9 U/L — ABNORMAL LOW (ref 15–41)
Albumin: 3.8 g/dL (ref 3.5–5.0)
Alkaline Phosphatase: 66 U/L (ref 38–126)
Anion gap: 11 (ref 5–15)
BUN: 12 mg/dL (ref 6–20)
CO2: 21 mmol/L — ABNORMAL LOW (ref 22–32)
Calcium: 9 mg/dL (ref 8.9–10.3)
Chloride: 107 mmol/L (ref 98–111)
Creatinine: 0.79 mg/dL (ref 0.61–1.24)
GFR, Est AFR Am: >60 mL/min (ref >60)
GFR, Estimated: >60 mL/min (ref >60)
Glucose, Bld: 194 mg/dL — ABNORMAL HIGH (ref 70–99)
Potassium: 4.2 mmol/L (ref 3.5–5.1)
Sodium: 139 mmol/L (ref 135–145)
Total Bilirubin: 0.4 mg/dL (ref 0.3–1.2)
Total Protein: 7.8 g/dL (ref 6.5–8.1)

## 2019-01-11 MED ORDER — SODIUM CHLORIDE 0.9 % IV SOLN
20.0000 mg | Freq: Once | INTRAVENOUS | Status: AC
  Administered 2019-01-11: 20 mg via INTRAVENOUS
  Filled 2019-01-11: qty 2

## 2019-01-11 MED ORDER — MONTELUKAST SODIUM 10 MG PO TABS
ORAL_TABLET | ORAL | Status: AC
  Filled 2019-01-11: qty 1

## 2019-01-11 MED ORDER — DIPHENHYDRAMINE HCL 25 MG PO CAPS
ORAL_CAPSULE | ORAL | Status: AC
  Filled 2019-01-11: qty 2

## 2019-01-11 MED ORDER — MONTELUKAST SODIUM 10 MG PO TABS
10.0000 mg | ORAL_TABLET | Freq: Every day | ORAL | Status: DC
  Administered 2019-01-11: 10 mg via ORAL

## 2019-01-11 MED ORDER — DIPHENHYDRAMINE HCL 25 MG PO CAPS
50.0000 mg | ORAL_CAPSULE | Freq: Once | ORAL | Status: AC
  Administered 2019-01-11: 50 mg via ORAL

## 2019-01-11 MED ORDER — SODIUM CHLORIDE 0.9 % IV SOLN
16.2000 mg/kg | Freq: Once | INTRAVENOUS | Status: AC
  Administered 2019-01-11: 1600 mg via INTRAVENOUS
  Filled 2019-01-11: qty 80

## 2019-01-11 MED ORDER — SODIUM CHLORIDE 0.9 % IV SOLN
Freq: Once | INTRAVENOUS | Status: AC
  Administered 2019-01-11: 10:00:00 via INTRAVENOUS
  Filled 2019-01-11: qty 250

## 2019-01-11 MED ORDER — ACETAMINOPHEN 325 MG PO TABS
650.0000 mg | ORAL_TABLET | Freq: Once | ORAL | Status: AC
  Administered 2019-01-11: 10:00:00 650 mg via ORAL

## 2019-01-11 MED ORDER — ACETAMINOPHEN 325 MG PO TABS
ORAL_TABLET | ORAL | Status: AC
  Filled 2019-01-11: qty 2

## 2019-01-11 NOTE — Patient Instructions (Signed)
Daratumumab injection What is this medicine? DARATUMUMAB (dar a toom ue mab) is a monoclonal antibody. It is used to treat multiple myeloma. This medicine may be used for other purposes; ask your health care provider or pharmacist if you have questions. COMMON BRAND NAME(S): DARZALEX What should I tell my health care provider before I take this medicine? They need to know if you have any of these conditions:  infection (especially a virus infection such as chickenpox, herpes, or hepatitis B virus)  lung or breathing disease  an unusual or allergic reaction to daratumumab, other medicines, foods, dyes, or preservatives  pregnant or trying to get pregnant  breast-feeding How should I use this medicine? This medicine is for infusion into a vein. It is given by a health care professional in a hospital or clinic setting. Talk to your pediatrician regarding the use of this medicine in children. Special care may be needed. Overdosage: If you think you have taken too much of this medicine contact a poison control center or emergency room at once. NOTE: This medicine is only for you. Do not share this medicine with others. What if I miss a dose? Keep appointments for follow-up doses as directed. It is important not to miss your dose. Call your doctor or health care professional if you are unable to keep an appointment. What may interact with this medicine? Interactions have not been studied. This list may not describe all possible interactions. Give your health care provider a list of all the medicines, herbs, non-prescription drugs, or dietary supplements you use. Also tell them if you smoke, drink alcohol, or use illegal drugs. Some items may interact with your medicine. What should I watch for while using this medicine? This drug may make you feel generally unwell. Report any side effects. Continue your course of treatment even though you feel ill unless your doctor tells you to stop. This  medicine can cause serious allergic reactions. To reduce your risk you may need to take medicine before treatment with this medicine. Take your medicine as directed. This medicine can affect the results of blood tests to match your blood type. These changes can last for up to 6 months after the final dose. Your healthcare provider will do blood tests to match your blood type before you start treatment. Tell all of your healthcare providers that you are being treated with this medicine before receiving a blood transfusion. This medicine can affect the results of some tests used to determine treatment response; extra tests may be needed to evaluate response. Do not become pregnant while taking this medicine or for 3 months after stopping it. Women should inform their doctor if they wish to become pregnant or think they might be pregnant. There is a potential for serious side effects to an unborn child. Talk to your health care professional or pharmacist for more information. What side effects may I notice from receiving this medicine? Side effects that you should report to your doctor or health care professional as soon as possible:  allergic reactions like skin rash, itching or hives, swelling of the face, lips, or tongue  breathing problems  chills  cough  dizziness  feeling faint or lightheaded  headache  low blood counts - this medicine may decrease the number of white blood cells, red blood cells and platelets. You may be at increased risk for infections and bleeding.  nausea, vomiting  shortness of breath  signs of decreased platelets or bleeding - bruising, pinpoint red spots on  the skin, black, tarry stools, blood in the urine  signs of decreased red blood cells - unusually weak or tired, feeling faint or lightheaded, falls  signs of infection - fever or chills, cough, sore throat, pain or difficulty passing urine  signs and symptoms of liver injury like dark yellow or brown  urine; general ill feeling or flu-like symptoms; light-colored stools; loss of appetite; right upper belly pain; unusually weak or tired; yellowing of the eyes or skin Side effects that usually do not require medical attention (report to your doctor or health care professional if they continue or are bothersome):  back pain  constipation  loss of appetite  diarrhea  joint pain  muscle cramps  pain, tingling, numbness in the hands or feet  swelling of the ankles, feet, hands  tiredness  trouble sleeping This list may not describe all possible side effects. Call your doctor for medical advice about side effects. You may report side effects to FDA at 1-800-FDA-1088. Where should I keep my medicine? Keep out of the reach of children. This drug is given in a hospital or clinic and will not be stored at home. NOTE: This sheet is a summary. It may not cover all possible information. If you have questions about this medicine, talk to your doctor, pharmacist, or health care provider.  2020 Elsevier/Gold Standard (2018-01-25 14:00:48)  [facility] Discharge Instructions for Patients Receiving Chemotherapy  Today you received the following chemotherapy agents Darzalex  To help prevent nausea and vomiting after your treatment, we encourage you to take your nausea medication as directed.    If you develop nausea and vomiting that is not controlled by your nausea medication, call the clinic.   BELOW ARE SYMPTOMS THAT SHOULD BE REPORTED IMMEDIATELY:  *FEVER GREATER THAN 100.5 F  *CHILLS WITH OR WITHOUT FEVER  NAUSEA AND VOMITING THAT IS NOT CONTROLLED WITH YOUR NAUSEA MEDICATION  *UNUSUAL SHORTNESS OF BREATH  *UNUSUAL BRUISING OR BLEEDING  TENDERNESS IN MOUTH AND THROAT WITH OR WITHOUT PRESENCE OF ULCERS  *URINARY PROBLEMS  *BOWEL PROBLEMS  UNUSUAL RASH Items with * indicate a potential emergency and should be followed up as soon as possible.  Feel free to  call the clinic should you have any questions or concerns. The clinic phone number is [phone].  Please show the CHEMO ALERT CARD at check-in to the Emergency Department and triage nurse.   

## 2019-01-11 NOTE — Progress Notes (Signed)
Per Dr. Burr Medico: OK to treat with ANC of 0.9

## 2019-01-14 ENCOUNTER — Other Ambulatory Visit: Payer: Self-pay | Admitting: Hematology

## 2019-01-14 ENCOUNTER — Telehealth: Payer: Self-pay

## 2019-01-14 ENCOUNTER — Telehealth: Payer: Self-pay | Admitting: Emergency Medicine

## 2019-01-14 ENCOUNTER — Telehealth: Payer: Self-pay | Admitting: Hematology

## 2019-01-14 ENCOUNTER — Other Ambulatory Visit: Payer: Self-pay | Admitting: Nurse Practitioner

## 2019-01-14 DIAGNOSIS — N390 Urinary tract infection, site not specified: Secondary | ICD-10-CM

## 2019-01-14 MED ORDER — OXYCODONE HCL 5 MG PO TABA: 5.0000 mg | ORAL_TABLET | Freq: Three times a day (TID) | ORAL | 0 refills | Status: DC | PRN

## 2019-01-14 MED ORDER — OXYCODONE HCL 5 MG PO TABS: 5.0000 mg | ORAL_TABLET | Freq: Three times a day (TID) | ORAL | 0 refills | Status: DC | PRN

## 2019-01-14 MED ORDER — CIPROFLOXACIN HCL 500 MG PO TABS: 500.0000 mg | ORAL_TABLET | Freq: Two times a day (BID) | ORAL | 0 refills | Status: AC

## 2019-01-14 NOTE — Telephone Encounter (Signed)
No los per 9/18.

## 2019-01-14 NOTE — Telephone Encounter (Signed)
LVM informing pt of oxycodone refill and cipro abx scrip that had been sent in to pt's preferred pharmacy.

## 2019-01-14 NOTE — Telephone Encounter (Signed)
Left voice message for patient regarding pain medication.  Informed him Dr. Burr Medico has sent in plain Oxycodone and it is covered under his insurance.  We had received a message that the Oxayda was $600.  Requested he call back if he has questions.

## 2019-01-18 ENCOUNTER — Inpatient Hospital Stay: Payer: 59

## 2019-01-18 ENCOUNTER — Telehealth: Payer: Self-pay | Admitting: *Deleted

## 2019-01-18 ENCOUNTER — Other Ambulatory Visit: Payer: Self-pay | Admitting: Hematology

## 2019-01-18 ENCOUNTER — Other Ambulatory Visit: Payer: Self-pay

## 2019-01-18 DIAGNOSIS — Z5112 Encounter for antineoplastic immunotherapy: Secondary | ICD-10-CM | POA: Diagnosis not present

## 2019-01-18 DIAGNOSIS — C9000 Multiple myeloma not having achieved remission: Secondary | ICD-10-CM

## 2019-01-18 LAB — CMP (CANCER CENTER ONLY)
ALT: 11 U/L (ref 0–44)
AST: 9 U/L — ABNORMAL LOW (ref 15–41)
Albumin: 3.6 g/dL (ref 3.5–5.0)
Alkaline Phosphatase: 77 U/L (ref 38–126)
Anion gap: 10 (ref 5–15)
BUN: 13 mg/dL (ref 6–20)
CO2: 20 mmol/L — ABNORMAL LOW (ref 22–32)
Calcium: 9.2 mg/dL (ref 8.9–10.3)
Chloride: 110 mmol/L (ref 98–111)
Creatinine: 0.73 mg/dL (ref 0.61–1.24)
GFR, Est AFR Am: >60 mL/min (ref >60)
GFR, Estimated: >60 mL/min (ref >60)
Glucose, Bld: 173 mg/dL — ABNORMAL HIGH (ref 70–99)
Potassium: 4 mmol/L (ref 3.5–5.1)
Sodium: 140 mmol/L (ref 135–145)
Total Bilirubin: 0.4 mg/dL (ref 0.3–1.2)
Total Protein: 7.5 g/dL (ref 6.5–8.1)

## 2019-01-18 LAB — CBC WITH DIFFERENTIAL (CANCER CENTER ONLY)
Abs Immature Granulocytes: 0 10*3/uL (ref 0.00–0.07)
Basophils Absolute: 0 10*3/uL (ref 0.0–0.1)
Basophils Relative: 0 %
Eosinophils Absolute: 0 10*3/uL (ref 0.0–0.5)
Eosinophils Relative: 2 %
HCT: 29.9 % — ABNORMAL LOW (ref 39.0–52.0)
Hemoglobin: 10.4 g/dL — ABNORMAL LOW (ref 13.0–17.0)
Immature Granulocytes: 0 %
Lymphocytes Relative: 84 %
Lymphs Abs: 0.4 10*3/uL — ABNORMAL LOW (ref 0.7–4.0)
MCH: 30.9 pg (ref 26.0–34.0)
MCHC: 34.8 g/dL (ref 30.0–36.0)
MCV: 88.7 fL (ref 80.0–100.0)
Monocytes Absolute: 0 10*3/uL — ABNORMAL LOW (ref 0.1–1.0)
Monocytes Relative: 6 %
Neutro Abs: 0 10*3/uL — CL (ref 1.7–7.7)
Neutrophils Relative %: 8 %
Platelet Count: 179 10*3/uL (ref 150–400)
RBC: 3.37 MIL/uL — ABNORMAL LOW (ref 4.22–5.81)
RDW: 14.2 % (ref 11.5–15.5)
WBC Count: 0.5 10*3/uL — CL (ref 4.0–10.5)
nRBC: 0 % (ref 0.0–0.2)

## 2019-01-18 MED ORDER — CIPROFLOXACIN HCL 500 MG PO TABS: 500.0000 mg | ORAL_TABLET | Freq: Every day | ORAL | 0 refills | Status: DC

## 2019-01-18 MED ORDER — POMALIDOMIDE 2 MG PO CAPS: 2.0000 mg | ORAL_CAPSULE | Freq: Every day | ORAL | 0 refills | Status: DC

## 2019-01-18 NOTE — Progress Notes (Unsigned)
Spoke with patient in treatment area F with his nurse Erline Levine present.  Informed him WBC count is too low so per Dr. Burr Medico to get chemo Tonette Bihari) today.  Instructed him to not take Pomalyst.  Dose is being changed to 2 mg daily but do not start it until after labs are checked on Friday 10/02.  He denies any nausea, vomiting or diarrhea, does not feel dehydrated so does not feel he needs IVF at this time.  Patient verbalized an understanding and was able to repeat my instructions.   Received notice ANC 0.0, per Dr. Burr Medico she is sending in Cipro 500 mg take one tablet for 7 days.  Reviewed neutropenic precautions with the patient and he verbalized an understanding.

## 2019-01-18 NOTE — Telephone Encounter (Signed)
CRITICAL VALUE STICKER  CRITICAL VALUE: WBC = 0.5.  RECEIVER (on-site recipient of call): Donnella Morford Winston-Spruiell RN, Triage [facility].   DATE & TIME NOTIFIED: 01/18/2019 at 0830.   MESSENGER (representative from lab): Reva MT [facility].  MD NOTIFIED: Collaborative for Dr. Burr Medico.  TIME OF NOTIFICATION: 01/18/2019 at 0833.  RESPONSE: None.

## 2019-01-20 ENCOUNTER — Other Ambulatory Visit: Payer: Self-pay | Admitting: Hematology

## 2019-01-21 LAB — MULTIPLE MYELOMA PANEL, SERUM
Albumin SerPl Elph-Mcnc: 3.8 g/dL (ref 2.9–4.4)
Albumin/Glob SerPl: 1.1 (ref 0.7–1.7)
Alpha 1: 0.2 g/dL (ref 0.0–0.4)
Alpha2 Glob SerPl Elph-Mcnc: 1 g/dL (ref 0.4–1.0)
B-Globulin SerPl Elph-Mcnc: 2.2 g/dL — ABNORMAL HIGH (ref 0.7–1.3)
Gamma Glob SerPl Elph-Mcnc: 0.1 g/dL — ABNORMAL LOW (ref 0.4–1.8)
Globulin, Total: 3.5 g/dL (ref 2.2–3.9)
IgA: 17 mg/dL — ABNORMAL LOW (ref 90–386)
IgG (Immunoglobin G), Serum: 1316 mg/dL (ref 603–1613)
IgM (Immunoglobulin M), Srm: 31 mg/dL (ref 20–172)
M Protein SerPl Elph-Mcnc: 1.3 g/dL — ABNORMAL HIGH
Total Protein ELP: 7.3 g/dL (ref 6.0–8.5)

## 2019-01-21 LAB — KAPPA/LAMBDA LIGHT CHAINS
Kappa free light chain: 28 mg/L — ABNORMAL HIGH (ref 3.3–19.4)
Kappa, lambda light chain ratio: 6.51 — ABNORMAL HIGH (ref 0.26–1.65)
Lambda free light chains: 4.3 mg/L — ABNORMAL LOW (ref 5.7–26.3)

## 2019-01-22 ENCOUNTER — Other Ambulatory Visit: Payer: Self-pay | Admitting: Hematology

## 2019-01-23 ENCOUNTER — Other Ambulatory Visit: Payer: Self-pay | Admitting: Hematology

## 2019-01-23 DIAGNOSIS — C9000 Multiple myeloma not having achieved remission: Secondary | ICD-10-CM

## 2019-01-24 ENCOUNTER — Other Ambulatory Visit: Payer: Self-pay

## 2019-01-24 ENCOUNTER — Telehealth: Payer: Self-pay

## 2019-01-24 ENCOUNTER — Encounter (HOSPITAL_COMMUNITY): Payer: Self-pay

## 2019-01-24 ENCOUNTER — Observation Stay (HOSPITAL_COMMUNITY): Payer: Medicaid Other

## 2019-01-24 ENCOUNTER — Other Ambulatory Visit: Payer: Self-pay | Admitting: Emergency Medicine

## 2019-01-24 ENCOUNTER — Inpatient Hospital Stay (HOSPITAL_BASED_OUTPATIENT_CLINIC_OR_DEPARTMENT_OTHER): Payer: Medicaid Other | Admitting: Medical

## 2019-01-24 ENCOUNTER — Inpatient Hospital Stay: Payer: Medicaid Other | Attending: Hematology | Admitting: Medical

## 2019-01-24 ENCOUNTER — Encounter: Payer: Self-pay | Admitting: General Practice

## 2019-01-24 ENCOUNTER — Encounter: Payer: Self-pay | Admitting: Medical

## 2019-01-24 ENCOUNTER — Emergency Department (HOSPITAL_COMMUNITY)
Admission: EM | Admit: 2019-01-24 | Discharge: 2019-01-24 | Disposition: A | Discharge status: Home / Self Care | Payer: Medicaid Other | Source: Home / Self Care

## 2019-01-24 ENCOUNTER — Inpatient Hospital Stay (HOSPITAL_COMMUNITY)
Admission: AD | Admit: 2019-01-24 | Discharge: 2019-01-26 | DRG: 103 | Discharge status: Against Medical Advice | Payer: Medicaid Other | Source: Ambulatory Visit | Attending: Family Medicine | Admitting: Family Medicine

## 2019-01-24 VITALS — BP 139/99 | HR 85 | Temp 98.3°F | Resp 18 | Ht 75.0 in | Wt 208.7 lb

## 2019-01-24 DIAGNOSIS — I10 Essential (primary) hypertension: Secondary | ICD-10-CM | POA: Insufficient documentation

## 2019-01-24 DIAGNOSIS — G4489 Other headache syndrome: Secondary | ICD-10-CM

## 2019-01-24 DIAGNOSIS — Z5321 Procedure and treatment not carried out due to patient leaving prior to being seen by health care provider: Secondary | ICD-10-CM | POA: Insufficient documentation

## 2019-01-24 DIAGNOSIS — R110 Nausea: Secondary | ICD-10-CM | POA: Insufficient documentation

## 2019-01-24 DIAGNOSIS — R531 Weakness: Secondary | ICD-10-CM | POA: Insufficient documentation

## 2019-01-24 DIAGNOSIS — T451X5A Adverse effect of antineoplastic and immunosuppressive drugs, initial encounter: Secondary | ICD-10-CM | POA: Diagnosis present

## 2019-01-24 DIAGNOSIS — E119 Type 2 diabetes mellitus without complications: Secondary | ICD-10-CM | POA: Diagnosis present

## 2019-01-24 DIAGNOSIS — R112 Nausea with vomiting, unspecified: Secondary | ICD-10-CM | POA: Diagnosis not present

## 2019-01-24 DIAGNOSIS — Z79899 Other long term (current) drug therapy: Secondary | ICD-10-CM

## 2019-01-24 DIAGNOSIS — C9000 Multiple myeloma not having achieved remission: Secondary | ICD-10-CM

## 2019-01-24 DIAGNOSIS — M545 Low back pain: Secondary | ICD-10-CM | POA: Insufficient documentation

## 2019-01-24 DIAGNOSIS — H53149 Visual discomfort, unspecified: Secondary | ICD-10-CM

## 2019-01-24 DIAGNOSIS — R441 Visual hallucinations: Secondary | ICD-10-CM | POA: Insufficient documentation

## 2019-01-24 DIAGNOSIS — Z5112 Encounter for antineoplastic immunotherapy: Secondary | ICD-10-CM | POA: Insufficient documentation

## 2019-01-24 DIAGNOSIS — Z599 Problem related to housing and economic circumstances, unspecified: Secondary | ICD-10-CM

## 2019-01-24 DIAGNOSIS — Z794 Long term (current) use of insulin: Secondary | ICD-10-CM

## 2019-01-24 DIAGNOSIS — R519 Headache, unspecified: Principal | ICD-10-CM | POA: Diagnosis present

## 2019-01-24 DIAGNOSIS — Z7982 Long term (current) use of aspirin: Secondary | ICD-10-CM

## 2019-01-24 DIAGNOSIS — D638 Anemia in other chronic diseases classified elsewhere: Secondary | ICD-10-CM | POA: Diagnosis present

## 2019-01-24 DIAGNOSIS — R109 Unspecified abdominal pain: Secondary | ICD-10-CM | POA: Insufficient documentation

## 2019-01-24 DIAGNOSIS — D702 Other drug-induced agranulocytosis: Secondary | ICD-10-CM | POA: Diagnosis not present

## 2019-01-24 DIAGNOSIS — D701 Agranulocytosis secondary to cancer chemotherapy: Secondary | ICD-10-CM | POA: Insufficient documentation

## 2019-01-24 DIAGNOSIS — Z20828 Contact with and (suspected) exposure to other viral communicable diseases: Secondary | ICD-10-CM | POA: Diagnosis present

## 2019-01-24 DIAGNOSIS — R600 Localized edema: Secondary | ICD-10-CM | POA: Insufficient documentation

## 2019-01-24 DIAGNOSIS — H833X3 Noise effects on inner ear, bilateral: Secondary | ICD-10-CM

## 2019-01-24 DIAGNOSIS — Z91013 Allergy to seafood: Secondary | ICD-10-CM

## 2019-01-24 DIAGNOSIS — R443 Hallucinations, unspecified: Secondary | ICD-10-CM | POA: Diagnosis present

## 2019-01-24 DIAGNOSIS — F4322 Adjustment disorder with anxiety: Secondary | ICD-10-CM

## 2019-01-24 DIAGNOSIS — F439 Reaction to severe stress, unspecified: Secondary | ICD-10-CM | POA: Diagnosis present

## 2019-01-24 DIAGNOSIS — Z79890 Hormone replacement therapy (postmenopausal): Secondary | ICD-10-CM

## 2019-01-24 DIAGNOSIS — G4700 Insomnia, unspecified: Secondary | ICD-10-CM | POA: Insufficient documentation

## 2019-01-24 DIAGNOSIS — D6481 Anemia due to antineoplastic chemotherapy: Secondary | ICD-10-CM | POA: Diagnosis present

## 2019-01-24 DIAGNOSIS — Z9221 Personal history of antineoplastic chemotherapy: Secondary | ICD-10-CM | POA: Insufficient documentation

## 2019-01-24 DIAGNOSIS — R6883 Chills (without fever): Secondary | ICD-10-CM

## 2019-01-24 DIAGNOSIS — G43909 Migraine, unspecified, not intractable, without status migrainosus: Secondary | ICD-10-CM | POA: Insufficient documentation

## 2019-01-24 DIAGNOSIS — D649 Anemia, unspecified: Secondary | ICD-10-CM | POA: Insufficient documentation

## 2019-01-24 DIAGNOSIS — E1165 Type 2 diabetes mellitus with hyperglycemia: Secondary | ICD-10-CM | POA: Insufficient documentation

## 2019-01-24 DIAGNOSIS — Z833 Family history of diabetes mellitus: Secondary | ICD-10-CM

## 2019-01-24 LAB — CBC WITH DIFFERENTIAL (CANCER CENTER ONLY)
Abs Immature Granulocytes: 0 10*3/uL (ref 0.00–0.07)
Basophils Absolute: 0 10*3/uL (ref 0.0–0.1)
Basophils Relative: 1 %
Eosinophils Absolute: 0 10*3/uL (ref 0.0–0.5)
Eosinophils Relative: 1 %
HCT: 29.2 % — ABNORMAL LOW (ref 39.0–52.0)
Hemoglobin: 10.2 g/dL — ABNORMAL LOW (ref 13.0–17.0)
Immature Granulocytes: 0 %
Lymphocytes Relative: 82 %
Lymphs Abs: 1.2 10*3/uL (ref 0.7–4.0)
MCH: 31 pg (ref 26.0–34.0)
MCHC: 34.9 g/dL (ref 30.0–36.0)
MCV: 88.8 fL (ref 80.0–100.0)
Monocytes Absolute: 0.2 10*3/uL (ref 0.1–1.0)
Monocytes Relative: 11 %
Neutro Abs: 0.1 10*3/uL — CL (ref 1.7–7.7)
Neutrophils Relative %: 5 %
Platelet Count: 184 10*3/uL (ref 150–400)
RBC: 3.29 MIL/uL — ABNORMAL LOW (ref 4.22–5.81)
RDW: 14 % (ref 11.5–15.5)
WBC Count: 1.5 10*3/uL — ABNORMAL LOW (ref 4.0–10.5)
nRBC: 0 % (ref 0.0–0.2)

## 2019-01-24 LAB — URINALYSIS, COMPLETE (UACMP) WITH MICROSCOPIC
Bacteria, UA: NONE SEEN
Bilirubin Urine: NEGATIVE
Glucose, UA: NEGATIVE mg/dL
Hgb urine dipstick: NEGATIVE
Ketones, ur: 5 mg/dL — AB
Leukocytes,Ua: NEGATIVE
Nitrite: NEGATIVE
Protein, ur: NEGATIVE mg/dL
Specific Gravity, Urine: 1.023 (ref 1.005–1.030)
pH: 5 (ref 5.0–8.0)

## 2019-01-24 LAB — CMP (CANCER CENTER ONLY)
ALT: 9 U/L (ref 0–44)
AST: 7 U/L — ABNORMAL LOW (ref 15–41)
Albumin: 3.6 g/dL (ref 3.5–5.0)
Alkaline Phosphatase: 82 U/L (ref 38–126)
Anion gap: 9 (ref 5–15)
BUN: 8 mg/dL (ref 6–20)
CO2: 23 mmol/L (ref 22–32)
Calcium: 8.6 mg/dL — ABNORMAL LOW (ref 8.9–10.3)
Chloride: 107 mmol/L (ref 98–111)
Creatinine: 0.7 mg/dL (ref 0.61–1.24)
GFR, Est AFR Am: >60 mL/min (ref >60)
GFR, Estimated: >60 mL/min (ref >60)
Glucose, Bld: 117 mg/dL — ABNORMAL HIGH (ref 70–99)
Potassium: 3.6 mmol/L (ref 3.5–5.1)
Sodium: 139 mmol/L (ref 135–145)
Total Bilirubin: 0.4 mg/dL (ref 0.3–1.2)
Total Protein: 7.3 g/dL (ref 6.5–8.1)

## 2019-01-24 LAB — SAMPLE TO BLOOD BANK

## 2019-01-24 LAB — LACTIC ACID, PLASMA: Lactic Acid, Venous: 1.7 mmol/L (ref 0.5–1.9)

## 2019-01-24 LAB — MAGNESIUM: Magnesium: 1.7 mg/dL (ref 1.7–2.4)

## 2019-01-24 MED ORDER — ACETAMINOPHEN 500 MG PO TABS: 500.0000 mg | ORAL_TABLET | Freq: Four times a day (QID) | ORAL | Status: DC | PRN

## 2019-01-24 MED ORDER — TBO-FILGRASTIM 480 MCG/0.8ML ~~LOC~~ SOSY
480.0000 ug | PREFILLED_SYRINGE | Freq: Every day | SUBCUTANEOUS | Status: DC
  Administered 2019-01-24 – 2019-01-25 (×2): 480 ug via SUBCUTANEOUS
  Filled 2019-01-24 (×3): qty 0.8

## 2019-01-24 MED ORDER — SODIUM CHLORIDE 0.9 % IV SOLN
Freq: Once | INTRAVENOUS | Status: AC
  Administered 2019-01-24: 14:00:00 via INTRAVENOUS
  Filled 2019-01-24: qty 250

## 2019-01-24 MED ORDER — ACYCLOVIR 400 MG PO TABS
400.0000 mg | ORAL_TABLET | Freq: Two times a day (BID) | ORAL | Status: DC
  Administered 2019-01-24 – 2019-01-26 (×4): 400 mg via ORAL
  Filled 2019-01-24 (×4): qty 1

## 2019-01-24 MED ORDER — CALCIUM CARBONATE-VITAMIN D 500-200 MG-UNIT PO TABS
1.0000 | ORAL_TABLET | Freq: Two times a day (BID) | ORAL | Status: DC
  Administered 2019-01-24 – 2019-01-26 (×4): 1 via ORAL
  Filled 2019-01-24 (×4): qty 1

## 2019-01-24 MED ORDER — POLYETHYLENE GLYCOL 3350 17 G PO PACK: 17.0000 g | PACK | Freq: Every day | ORAL | Status: DC | PRN

## 2019-01-24 MED ORDER — ENOXAPARIN SODIUM 40 MG/0.4ML ~~LOC~~ SOLN
40.0000 mg | SUBCUTANEOUS | Status: DC
  Administered 2019-01-25: 40 mg via SUBCUTANEOUS
  Filled 2019-01-24 (×2): qty 0.4

## 2019-01-24 MED ORDER — SODIUM CHLORIDE 0.9 % IV SOLN
INTRAVENOUS | Status: DC
  Administered 2019-01-24: 19:00:00 via INTRAVENOUS

## 2019-01-24 MED ORDER — ONDANSETRON HCL 4 MG/2ML IJ SOLN
8.0000 mg | Freq: Once | INTRAMUSCULAR | Status: AC
  Administered 2019-01-24: 8 mg via INTRAVENOUS

## 2019-01-24 MED ORDER — ONDANSETRON HCL 4 MG/2ML IJ SOLN
INTRAMUSCULAR | Status: AC
  Filled 2019-01-24: qty 4

## 2019-01-24 MED ORDER — POTASSIUM CHLORIDE 20 MEQ PO PACK
20.0000 meq | PACK | Freq: Every day | ORAL | Status: DC
  Administered 2019-01-25 – 2019-01-26 (×2): 20 meq via ORAL
  Filled 2019-01-24 (×3): qty 1

## 2019-01-24 MED ORDER — METHOCARBAMOL 500 MG PO TABS
750.0000 mg | ORAL_TABLET | Freq: Three times a day (TID) | ORAL | Status: DC | PRN
  Administered 2019-01-24: 750 mg via ORAL
  Filled 2019-01-24: qty 2

## 2019-01-24 MED ORDER — ONDANSETRON HCL 4 MG/2ML IJ SOLN
4.0000 mg | Freq: Four times a day (QID) | INTRAMUSCULAR | Status: DC | PRN
  Administered 2019-01-25: 4 mg via INTRAVENOUS
  Filled 2019-01-24: qty 2

## 2019-01-24 MED ORDER — SODIUM CHLORIDE 0.9% FLUSH: 3.0000 mL | Freq: Once | INTRAVENOUS | Status: DC

## 2019-01-24 MED ORDER — INSULIN ASPART 100 UNIT/ML ~~LOC~~ SOLN
0.0000 [IU] | Freq: Three times a day (TID) | SUBCUTANEOUS | Status: DC
  Administered 2019-01-25 – 2019-01-26 (×2): 1 [IU] via SUBCUTANEOUS

## 2019-01-24 MED ORDER — PANTOPRAZOLE SODIUM 40 MG PO TBEC
40.0000 mg | DELAYED_RELEASE_TABLET | Freq: Every day | ORAL | Status: DC
  Administered 2019-01-25: 40 mg via ORAL
  Filled 2019-01-24 (×2): qty 1

## 2019-01-24 MED ORDER — OXYCODONE HCL 5 MG PO TABS
5.0000 mg | ORAL_TABLET | Freq: Three times a day (TID) | ORAL | Status: DC | PRN
  Administered 2019-01-24: 5 mg via ORAL
  Filled 2019-01-24: qty 1

## 2019-01-24 MED ORDER — ASPIRIN 81 MG PO CHEW
81.0000 mg | CHEWABLE_TABLET | Freq: Every day | ORAL | Status: DC
  Administered 2019-01-25 – 2019-01-26 (×2): 81 mg via ORAL
  Filled 2019-01-24 (×2): qty 1

## 2019-01-24 MED ORDER — METOCLOPRAMIDE HCL 10 MG PO TABS: 10.0000 mg | ORAL_TABLET | Freq: Three times a day (TID) | ORAL | Status: DC | PRN

## 2019-01-24 MED ORDER — SENNOSIDES-DOCUSATE SODIUM 8.6-50 MG PO TABS: 1.0000 | ORAL_TABLET | Freq: Two times a day (BID) | ORAL | Status: DC | PRN

## 2019-01-24 MED ORDER — ONDANSETRON HCL 4 MG PO TABS: 4.0000 mg | ORAL_TABLET | Freq: Four times a day (QID) | ORAL | Status: DC | PRN

## 2019-01-24 NOTE — H&P (Signed)
TRH H&P    Patient Demographics:    Edward Todd, is a 43 y.o. male  MRN: [id number]  DOB - February 03, 1976  Admit Date - 01/24/2019   Outpatient Primary MD for the patient is London Pepper, MD  Patient coming from: Cancer center  Chief complaint-generalized weakness, headache, vomiting   HPI:    Edward Todd  is a 43 y.o. male, with recent diagnosis of multiple myeloma IgG kappa, stage III, who was started on weekly Velcade injection with dexamethasone started on 09/05/2018 and oral Revlimid on 09/12/2018.  Patient's treatment was recently changed to daratumumab, DEXA and Pomalyst in anticipation for bone marrow transplant.  Patient says that he since he changed the treatment plan he has noticed aches and pains.  Patient is on Pomalyst 4 mg 3 weeks on and one-week off.  Currently he is in his off week and is supposed to start tomorrow. As per patient he is complaining of headache, had 2 episodes of vomiting today.  Also had an episode of hallucination where he saw people floating in the air which lasted for about 10 minutes.  He denies photophobia.  No neck stiffness. Patient feels that since his chemotherapy drug was changed he is low in energy.  And he is attributing all his symptoms to change of chemotherapy regimen. He denies chest pain or shortness of breath. Denies fever or chills. Denies coughing up any phlegm. He was recently treated for questionable UTI with p.o. ciprofloxacin. He denies dysuria    Review of systems:    In addition to the HPI above,    All other systems reviewed and are negative.    Past History of the following :    Past Medical History:  Diagnosis Date  . Cancer ([place])   . Diabetes mellitus without complication ([place])   . Hypertension          Social History:      Social History   Tobacco Use  . Smoking status: Never Smoker  . Smokeless tobacco: Never  Used  Substance Use Topics  . Alcohol use: No    Alcohol/week: 0.0 standard drinks       Family History :     Family History  Problem Relation Age of Onset  . Diabetes Maternal Grandmother   . Diabetes Paternal Grandfather       Home Medications:   Prior to Admission medications   Medication Sig Start Date End Date Taking? Authorizing Provider  acetaminophen (TYLENOL) 325 MG tablet Take 2 tablets (650 mg total) by mouth every 6 (six) hours as needed for mild pain or fever. 09/24/18   Angiulli, Lavon Paganini, PA-C  acetaminophen (TYLENOL) 500 MG tablet Take 500 mg by mouth every 6 (six) hours as needed for mild pain.    [provider]  acyclovir (ZOVIRAX) 400 MG tablet TAKE 1 TABLET BY MOUTH TWICE A DAY 01/23/19   Truitt Merle, MD  aspirin 81 MG chewable tablet Chew 1 tablet (81 mg total) by mouth daily. 09/25/18   Angiulli, Lavon Paganini, PA-C  calcium-vitamin  D (OSCAL WITH D) 500-200 MG-UNIT tablet Take 1 tablet by mouth 2 (two) times daily. 09/24/18   Angiulli, Lavon Paganini, PA-C  ciprofloxacin (CIPRO) 500 MG tablet Take 1 tablet (500 mg total) by mouth daily. 01/18/19   Truitt Merle, MD  Continuous Blood Gluc Receiver (FREESTYLE LIBRE 14 DAY READER) [place] See admin instructions. 12/13/18   [provider]  Continuous Blood Gluc Sensor (FREESTYLE LIBRE 14 DAY SENSOR) MISC APPLY EVERY 14 DAYS 12/13/18   [provider]  dexamethasone (DECADRON) 4 MG tablet Take 5 tablets (20 mg total) by mouth every Friday. 12/14/18   Truitt Merle, MD  HUMALOG KWIKPEN 100 UNIT/ML KwikPen Inject 0-10 Units into the skin 2 (two) times daily. Per sliding scale, Hold if BS <120 11/14/18   [provider]  Melatonin 3 MG CAPS Take 1 capsule (3 mg total) by mouth at bedtime as needed (sleep). 11/30/18   Fawze, Mina A, PA-C  metFORMIN (GLUCOPHAGE) 1000 MG tablet TAKE 1 TABLET (1,000 MG TOTAL) BY MOUTH 2 (TWO) TIMES DAILY WITH A MEAL. 01/23/19   Truitt Merle, MD  methocarbamol (ROBAXIN) 750 MG tablet Take 1  tablet (750 mg total) by mouth 3 (three) times daily as needed for muscle spasms. 01/23/19   Truitt Merle, MD  metoCLOPramide (REGLAN) 10 MG tablet Take 1 tablet (10 mg total) by mouth every 8 (eight) hours as needed for nausea. 12/08/18   Truitt Merle, MD  ondansetron (ZOFRAN-ODT) 4 MG disintegrating tablet Take 1 tablet (4 mg total) by mouth every 6 (six) hours as needed for nausea or vomiting. 09/24/18   Angiulli, Lavon Paganini, PA-C  ONETOUCH VERIO test strip USE TO CHECK BLOOD SUGAR 3 TIMES A DAY 11/26/18   [provider]  oxyCODONE (OXY IR/ROXICODONE) 5 MG immediate release tablet Take 1 tablet (5 mg total) by mouth every 8 (eight) hours as needed for severe pain. 01/14/19   Alla Feeling, NP  pantoprazole (PROTONIX) 40 MG tablet TAKE 1 TABLET (40 MG TOTAL) BY MOUTH DAILY AT 6 (SIX) AM. 11/22/18   Truitt Merle, MD  polyethylene glycol (MIRALAX / GLYCOLAX) 17 g packet Take 17 g by mouth daily. Patient taking differently: Take 17 g by mouth daily as needed for mild constipation.  09/25/18   Angiulli, Lavon Paganini, PA-C  pomalidomide (POMALYST) 2 MG capsule Take 1 capsule (2 mg total) by mouth daily. [employer] Auth # [id number]     Date Obtained 01/18/2019 01/18/19   Truitt Merle, MD  potassium chloride (KLOR-CON) 20 MEQ packet Take 20 mEq by mouth daily. 12/29/18   Truitt Merle, MD  senna-docusate (SENOKOT-S) 8.6-50 MG tablet Take 1 tablet by mouth 2 (two) times daily. Patient taking differently: Take 1 tablet by mouth 2 (two) times daily as needed for mild constipation.  09/25/18   Angiulli, Lavon Paganini, PA-C     Allergies:     Allergies  Allergen Reactions  . Shellfish Allergy Rash     Physical Exam:   Vitals  Blood pressure (!) 130/93, pulse 71, temperature 98.3 F (36.8 C), temperature source Oral, resp. rate 20, height _0  (1.905 m), weight 94.3 kg, SpO2 100 %.  1.  General: Appears in no acute distress  2. Psychiatric: Alert, oriented x3, intact insight and judgment  3. Neurologic: Cranial nerves II  through XII grossly intact, motor strength 5/5 in all extremities  4. HEENMT:  Atraumatic normocephalic, extraocular muscles are intact.  No neck stiffness  5. Respiratory : Clear to auscultation bilaterally  6.  Cardiovascular : S1-S2, regular, no murmur auscultated  7. Gastrointestinal:  Abdomen is soft, nontender, no organomegaly  8. Skin:  No rashes noted      Data Review:    CBC Recent Labs  Lab 01/18/19 0809 01/24/19 1233  WBC 0.5* 1.5*  HGB 10.4* 10.2*  HCT 29.9* 29.2*  PLT 179 184  MCV 88.7 88.8  MCH 30.9 31.0  MCHC 34.8 34.9  RDW 14.2 14.0  LYMPHSABS 0.4* 1.2  MONOABS 0.0* 0.2  EOSABS 0.0 0.0  BASOSABS 0.0 0.0   ------------------------------------------------------------------------------------------------------------------  Results for orders placed or performed in visit on 01/24/19 (from the past 48 hour(s))  Lactic acid, plasma     Status: None   Collection Time: 01/24/19  2:57 PM  Result Value Ref Range   Lactic Acid, Venous 1.7 0.5 - 1.9 mmol/L    Comment: Performed at [facility], [address] [employer], [place] [id number]    Chemistries  Recent Labs  Lab 01/18/19 0809 01/24/19 1233  NA 140 139  K 4.0 3.6  CL 110 107  CO2 20* 23  GLUCOSE 173* 117*  BUN 13 8  CREATININE 0.73 0.70  CALCIUM 9.2 8.6*  MG  --  1.7  AST 9* 7*  ALT 11 9  ALKPHOS 77 82  BILITOT 0.4 0.4   ------------------------------------------------------------------------------------------------------------------  ------------------------------------------------------------------------------------------------------------------ GFR: Estimated Creatinine Clearance: 140.8 mL/min (by C-G formula based on SCr of 0.7 mg/dL). Liver Function Tests: Recent Labs  Lab 01/18/19 0809 01/24/19 1233  AST 9* 7*  ALT 11 9  ALKPHOS 77 82  BILITOT 0.4 0.4  PROT 7.5 7.3  ALBUMIN 3.6 3.6     --------------------------------------------------------------------------------------------------------------- Urine analysis:    Component Value Date/Time   COLORURINE YELLOW 01/24/2019 1520   APPEARANCEUR CLEAR 01/24/2019 1520   LABSPEC 1.023 01/24/2019 1520   PHURINE 5.0 01/24/2019 1520   GLUCOSEU NEGATIVE 01/24/2019 1520   [place] 01/24/2019 [place] 01/24/2019 1520   BILIRUBINUR neg 02/14/2013 2017   KETONESUR 5 (A) 01/24/2019 1520   PROTEINUR NEGATIVE 01/24/2019 1520   UROBILINOGEN 0.2 02/14/2013 2017   NITRITE NEGATIVE 01/24/2019 1520   LEUKOCYTESUR NEGATIVE 01/24/2019 1520        Assessment & Plan:    Active Problems:   Nausea & vomiting   1. Nausea and vomiting-unclear etiology, patient had 2 episodes of vomiting today.  Start clear liquid diet, IV normal saline at 75 mL/h.  Zofran PRN for nausea and vomiting.  2. Generalized weakness-started after patient was put on Pomalyst.  We will also check a UA and urine culture, chest x-ray.  3. Headache-patient has no neck stiffness or photophobia.  I do not suspect meningitis.  Patient says that the symptoms have started since the chemotherapy regimen was changed to Pomalyst.  Continue Tylenol PRN, oxycodone PRN.  4. Neutropenia-WBC is 1.5, with 5% neutrophils.  Patient has been afebrile.  Will not start antibiotics at this time.  Will start Granix 480 mcg subcutaneously daily until absolute neutrophil count is greater than 1000.  5. 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  Hemoglobin is stable.  Follow CBC in a.m.  6. Diabetes mellitus type 2-hold metformin, will start sliding scale insulin with NovoLog.    DVT Prophylaxis-   Lovenox   AM Labs Ordered, also please review Full Orders  Family Communication: Admission, patients condition and plan of care including tests being ordered have been discussed with the patient  who indicate understanding and agree with the plan and Code  Status.  Code  Status: Full code  Admission status: Observation: Based on patients clinical presentation and evaluation of above clinical data, I have made determination that patient meets Inpatient criteria at this time.  Time spent in minutes : 60 minutes   Oswald Hillock M.D on 01/24/2019 at 5:41 PM

## 2019-01-24 NOTE — Telephone Encounter (Signed)
Left message for patient as we had received notification that he was not feeling well and was advised to go to [facility] ED, went to ED but left without being seen.  I asked that he come in today to be seen in [facility] and to call me back to let me know what time he could come in.

## 2019-01-24 NOTE — Progress Notes (Signed)
Pt received 1L IVF NS and IV zofran, tolerated well.  N/V observed at beginning of infusion, resolved with medication.  Pt ate and drank during infusion, tolerated well.  Pt reported that ED visit this am (pt AMA d/t long wait) was d/t visual hallucinations (w/out AH or SI/HI), chills, N/V, fatigue, & dizziness.  Pt A&Ox4.  Two sets of peripheral blood cultures drawn before pt received any abx.  Urine sample retrieved and sent to lab.  Social worker Energy manager C consulting with patient.  Phone report given to [facility] on 6E.  Pt transported via [place] to 1614 with belongings.

## 2019-01-24 NOTE — Patient Instructions (Signed)

## 2019-01-24 NOTE — ED Notes (Signed)
Called patient in the lobby to take back to a room and no one responded

## 2019-01-24 NOTE — Telephone Encounter (Signed)
Spoke with Shawn in Bed Placement was given information that Dr. Sarajane Jews has accepted this patient for direct admission, non tele bed at [facility], he will pass this on and we will be notified when bed available.

## 2019-01-24 NOTE — Telephone Encounter (Signed)
Spoke with patient's wife regarding bringing him into be seen in [facility], gave her appointments lab at 1:30 and see Sandi Mealy PA at 2:00 pm.  Scheduling message was sent.

## 2019-01-24 NOTE — Progress Notes (Signed)
[facility] CSW Progress Notes  Request from Dr Feng to see patient in [facility] - patient recently lost his job and thus his health insurance.  Met w patient, reviewed options which include investigating getting on spouses insurance plan, finding short term policy, COBRA, applying for coverage through Health Insurance Exchanges (for 04/26/19 coverage).  Will send patient information from [facility] on dealing w cancer when uninsured.  He will speak w wife about option for coverage through her employer policy.  Will send additional options as available by email.  Anne Cunningham, LCSW Clinical Social Worker Phone:  [phone]  

## 2019-01-24 NOTE — Progress Notes (Addendum)
Symptoms Management Clinic Progress Note   Edward Todd [id number] Nov 04, 1975 43 y.o.  Edward Todd is managed by Dr. Truitt Todd  Actively treated with chemotherapy/immunotherapy/hormonal therapy: yes  Current therapy: Pomalyst 15m 3 weeks on/ 1 week off starting 12/29/18. Pomalyst changed to 239m3 weeks on/ 1 week off with initial plans to begin on 01/25/2019. IV weekly Daratumumab and oral Dexa 27m41may before infusion starting 01/04/19.  Last treated: IV weekly Daratumumab and oral Dexa 27mg77my before infusion last dosed on 01/11/2019.  Pomalyst last dosed on 01/17/2019.  Next scheduled appointment with provider: 01/25/2019  Assessment: Plan:    Nausea and vomiting, intractability of vomiting not specified, unspecified vomiting type - Plan: 0.9 %  sodium chloride infusion, ondansetron (ZOFRAN) injection 8 mg  Chills (without fever) - Plan: Urine Culture, Urinalysis, Complete w Microscopic, Culture, Blood, Culture, Blood  Other drug-induced neutropenia (HCC)[place]: Urine Culture, Urinalysis, Complete w Microscopic, Culture, Blood, Culture, Blood  Photophobia  Other headache syndrome  Sound sensitivity, bilateral  Multiple myeloma not having achieved remission (HCC)[place]assa[place] myeloma not in remission: Mr. RichHibberdtinues to be followed by Dr. Yan Edward Todd is currently treated with Pomalyst 4mg 61meeks on/ 1 week off starting 12/29/18. Pomalyst changed to 2mg 366meks on/ 1 week off with initial plans to begin on 01/25/2019. IV weekly Daratumumab and oral Dexa 27mg da29mefore infusion starting 01/04/19.  He had been scheduled to follow-up on 01/25/2019.  [facility], chills, neutropenia, photophobia, headaches, and sound sensitivity in a setting of multiple myeloma: The patient's labs returned today with WBC of 1.5 and an ANC of 0.1. Dr. Yan Edward Merlequested that the patient be directly admitted.  Plans are  to follow his CBC and possibly initiate Granix injections.  He has received Zofran 8 mg IV x1 and was given 1 L of normal saline while he was in the clinic.  Blood cultures x2 are being collected along with a lactic acid level, urinalysis, and urine culture.  His chemistry panel returned stable.  Type 2 diabetes: A chemistry panel returned today with a blood glucose of 117.  He continues on metformin 1000 mg p.o. twice daily and sliding scale Humalog insulin.  Please see After Visit Summary for patient specific instructions.  Future Appointments  Date Time Provider [email or web address]  7:30 AM [facility] LAB 3 [facility] None  01/25/2019  8:00 AM Feng, YTruitt MerleCC-MEDONC None  01/25/2019  9:15 AM [facility] INFUSION [facility] None  02/01/2019  8:45 AM [facility] LAB 1 [facility] None  02/01/2019  9:15 AM [facility] INFUSION [facility] None  02/08/2019  8:45 AM [facility] LAB 6 [facility] None  02/08/2019  9:15 AM [facility] INFUSION [facility] None  02/15/2019  8:45 AM [facility] LAB 6 [facility] None  02/15/2019  9:15 AM [facility] INFUSION [facility] None  02/22/2019  8:45 AM [facility] LAB ONLY [facility] None  02/22/2019  9:15 AM [facility] INFUSION [facility] None  02/26/2019  3:30 PM Yates, Edward [place] None    Orders Placed This Encounter  Procedures  . Urine Culture  . Culture, Blood  . Culture, Blood  . Urinalysis, Complete w Microscopic  . Lactic acid, plasma       Subjective:   Patient ID:  Edward Lippman3 y.o.66DOB [birth year]  Chief Complaint: No chief complaint on file.   HPI Edward Licausi43 y.o.72  male with a diagnosis of multiple myeloma which is not in remission. He followed by Dr. Truitt Todd and is currently treated with Pomalyst 107m 3 weeks on/ 1 week off starting 12/29/18. Pomalyst is to be changed to 266m3 weeks on/ 1 week off with initial plans to begin this on 01/25/2019.  He also receives IV weekly  Daratumumab and oral Dexa 38m47may before infusion starting 01/04/19.  He had been scheduled to follow-up on 01/25/2019.but [facility] now [facility] stating that he he had nausea last week.  This improved until yesterday when he developed recurrent nausea with sensations of heat followed by chills, headache, photophobia, sound sensitivity, and lightheadedness.  He also reports having an area of numbness along his right lateral thigh.  He has been having insomnia, mood swings and reports episodic visual hallucinations.  His wife took his temperature and found it to be 97.9.  [facility] but left after waiting 3 hours.  His temperature there was 97.9.  He describes his headache as being like a reports migraine headache.  A CBC returned today with a WBC of 1.5 and an ANC of 0.1. He has received Zofran 8 mg IV x1 and was given 1 L of normal saline while he was in the clinic.  Blood cultures x2 are being collected along with a lactic acid level, urinalysis, and urine culture.  His chemistry panel returned stable.  The patients complete oncology history is as follows:  SUMMARY OF ONCOLOGIC HISTORY:     Oncology History Overview Note   Cancer Staging Multiple myeloma (HCCPettibonetaging form: Plasma Cell Myeloma and Plasma Cell Disorders, AJCC 8th Edition - Clinical stage from 09/04/2018: RISS Stage III (Beta-2-microglobulin (mg/L): 5.5, Albumin (g/dL): 2.6, ISS: Stage III, High-risk cytogenetics: Absent, LDH: Elevated) - Signed by Edward MerleD on 09/26/2018     Multiple myeloma (HCCZoar 08/31/2018 Initial Diagnosis    Multiple myeloma ([place]CC[place]  09/04/2018 Cancer Staging    Staging form: Plasma Cell Myeloma and Plasma Cell Disorders, AJCC 8th Edition - Clinical stage from 09/04/2018: RISS Stage III (Beta-2-microglobulin (mg/L): 5.5, Albumin (g/dL): 2.6, ISS: Stage III, High-risk  cytogenetics: Absent, LDH: Elevated) - Signed by Edward MerleD on 09/26/2018    09/04/2018 Initial Biopsy    Diagnosis 09/04/18 Bone Marrow, Aspirate,Biopsy, and Clot, left posterior iliac crest BONE MARROW: - CELLULAR MARROW WITH INVOLVEMENT BY PLASMA CELL NEOPLASM (>95%) - SEE COMMENT PERIPHERAL BLOOD: - NORMOCYTIC ANEMIA - ROULEAUX FORMATION - SEE COMPLETE BLOOD COUNT    09/05/2018 - 12/28/2018 Chemotherapy    Weekly velcade injection and dexamethasone 26m22marting 09/05/18 -increase Velcade injection to twice a week (Fridays/Mondays) for 2 weeks on, 1 week off starting 10/19/18. Will return to weekly Velcade starting 11/09/18. -Dexa stopped 12/14/18 due to hyperglycemia. Restart on 9/4 at 20mg33mkly.              -Stop Velcade and change Dexa after 12/28/18.     09/14/2018 - 12/21/2018 Chemotherapy    Revlimid 25mg 138meks on/1 week off starting 09/14/18. Stopped 12/21/18 due to abdominal pain.      12/29/2018 -  Chemotherapy    Pomalyst 4mg 3 14mks on/ 1 week off starting 12/29/18.     01/04/2019 -  Chemotherapy    IV weekly Daratumumab and oral Dexa 38mg day4mfore infusion starting 01/04/19  Medications: I have reviewed the patient's current medications.  Allergies:  Allergies  Allergen Reactions  . Shellfish Allergy Rash    Past Medical History:  Diagnosis Date  . Cancer ([place])   . Diabetes mellitus without complication ([place])   . Hypertension     No past surgical history on file.  Family History  Problem Relation Age of Onset  . Diabetes Maternal Grandmother   . Diabetes Paternal Grandfather     Social History   Socioeconomic History  . Marital status: Married    Spouse name: Not on file  . Number of children: 1  . Years of education: Not on file  . Highest education level: Not on file  Occupational History  . Not on file  Social Needs  . Financial resource strain: Not on file  . Food insecurity    Worry: Not on file     Inability: Not on file  . Transportation needs    Medical: Not on file    Non-medical: Not on file  Tobacco Use  . Smoking status: Never Smoker  . Smokeless tobacco: Never Used  Substance and Sexual Activity  . Alcohol use: No    Alcohol/week: 0.0 standard drinks  . Drug use: No  . Sexual activity: Yes  Lifestyle  . Physical activity    Days per week: Not on file    Minutes per session: Not on file  . Stress: Not on file  Relationships  . Social Herbalist on phone: Not on file    Gets together: Not on file    Attends religious service: Not on file    Active member of club or organization: Not on file    Attends meetings of clubs or organizations: Not on file    Relationship status: Not on file  . Intimate partner violence    Fear of current or ex partner: Not on file    Emotionally abused: Not on file    Physically abused: Not on file    Forced sexual activity: Not on file  Other Topics Concern  . Not on file  Social History Narrative   Marital status:  married       Children:  1 child (43yo)      Lives: with son and wife.      Employment: [employer] x 2 months. Moved from [place].      Tobacco:  None      Alcohol: none      Drugs: none      Exercise:  Three days per week.       Seatbelt:  100%; some texting n driving.      Guns:  None      Sexual activity:  5; no STDs; females only.   Education: [employer]          Past Medical History, Surgical history, Social history, and Family history were reviewed and updated as appropriate.   Please see review of systems for further details on the patient's review from today.   Review of Systems:  Review of Systems  Constitutional: Positive for chills and diaphoresis. Negative for appetite change and fever.  HENT: Negative for trouble swallowing.   Eyes: Positive for photophobia.  Respiratory: Negative for cough, choking, shortness of breath and wheezing.   Cardiovascular: Negative for chest pain  and palpitations.  Gastrointestinal: Positive for nausea and vomiting. Negative for constipation and diarrhea.  Genitourinary: Negative for decreased urine volume.  Neurological: Positive for  headaches.       Noise sensitivity  Psychiatric/Behavioral: Positive for dysphoric mood, hallucinations and sleep disturbance.    Objective:   Physical Exam:  BP (!) 139/99 (BP Location: Left Arm, Patient Position: Sitting) Comment: Notified Nurse of BP  Pulse 85   Temp 98.3 F (36.8 C) (Temporal)   Resp 18   Ht '6\' 3"'  (1.905 m)   Wt 208 lb 11.2 oz (94.7 kg)   SpO2 98%   BMI 26.09 kg/m  ECOG: 1  Physical Exam Constitutional:      General: He is not in acute distress.    Appearance: He is not diaphoretic.  HENT:     Head: Normocephalic and atraumatic.  Eyes:     General: No scleral icterus.       Right eye: No discharge.        Left eye: No discharge.     Conjunctiva/sclera: Conjunctivae normal.  Cardiovascular:     Rate and Rhythm: Normal rate and regular rhythm.     Heart sounds: Normal heart sounds. No murmur. No friction rub. No gallop.   Pulmonary:     Effort: Pulmonary effort is normal. No respiratory distress.     Breath sounds: Normal breath sounds. No wheezing or rales.  Abdominal:     General: Bowel sounds are normal. There is no distension.     Tenderness: There is no abdominal tenderness. There is no guarding.  Skin:    General: Skin is warm and dry.     Findings: No erythema or rash.  Neurological:     Mental Status: He is alert.  Psychiatric:        Mood and Affect: Mood normal.        Behavior: Behavior normal.        Thought Content: Thought content normal.        Judgment: Judgment normal.     Lab Review:     Component Value Date/Time   NA 139 01/24/2019 1233   K 3.6 01/24/2019 1233   CL 107 01/24/2019 1233   CO2 23 01/24/2019 1233   GLUCOSE 117 (H) 01/24/2019 1233   BUN 8 01/24/2019 1233   CREATININE 0.70 01/24/2019 1233   CREATININE 1.17  10/09/2015 1504   CALCIUM 8.6 (L) 01/24/2019 1233   PROT 7.3 01/24/2019 1233   ALBUMIN 3.6 01/24/2019 1233   AST 7 (L) 01/24/2019 1233   ALT 9 01/24/2019 1233   ALKPHOS 82 01/24/2019 1233   BILITOT 0.4 01/24/2019 1233   GFRNONAA >60 01/24/2019 1233   GFRAA >60 01/24/2019 1233       Component Value Date/Time   WBC 1.5 (L) 01/24/2019 1233   WBC 4.5 09/24/2018 1443   RBC 3.29 (L) 01/24/2019 1233   HGB 10.2 (L) 01/24/2019 1233   HCT 29.2 (L) 01/24/2019 1233   PLT 184 01/24/2019 1233   MCV 88.8 01/24/2019 1233   MCV 92.3 02/14/2013 2009   MCH 31.0 01/24/2019 1233   MCHC 34.9 01/24/2019 1233   RDW 14.0 01/24/2019 1233   LYMPHSABS 1.2 01/24/2019 1233   MONOABS 0.2 01/24/2019 1233   EOSABS 0.0 01/24/2019 1233   BASOSABS 0.0 01/24/2019 1233   -------------------------------  Imaging from last 24 hours (if applicable):  Radiology interpretation: No results found.      This patient was seen with Dr. Burr Medico with my treatment plan reviewed with her. She expressed agreement with my medical management of this patient.   Addendum  I have seen the patient, examined  him. I agree with the assessment and and plan and have edited the notes.   Pt has persistent severe neutropenia from chemotherapy, with ANC 0 last week, and a 0.1 today.  He has no fever, however developed chills, headaches and photophobia (new), malaise, lightheadedness, since last night.  No fever at home or in the office.  Exam was unremarkable, no neck rigidity.  However I am very concerned about the possibility of infection, due to his severe pneutropenia. He has been on prophylactic Cipro (was treated for UTI last week). We have obtained cultures in our office, and will admit him to [facility] hospital for supportive care, and GCSF. I have talked to hospitalist team, and appreciate their assistance. Please start him on granix 482mg daily, and follow up daily CBC with diff and BMP. I think it's OK to hold iv broad-spectrum  antibiotics, okay to continue Cipro Levaquin as prophylactic antibiotics. I will follow up. I spoke with his wife. I also asked our SW AWebb Silversmithto talk to him about his insurance options, he unfortunately lost his job and insurance recently.  YTruitt Todd 01/24/2019

## 2019-01-24 NOTE — ED Triage Notes (Signed)
Pt arrived stating about three hours ago he began feeling weak, headache, and nauseated. Pt states he had cancer treatment on Friday.

## 2019-01-25 ENCOUNTER — Inpatient Hospital Stay: Payer: Medicaid Other | Admitting: Hematology

## 2019-01-25 ENCOUNTER — Encounter (HOSPITAL_COMMUNITY): Payer: Self-pay | Admitting: Radiology

## 2019-01-25 ENCOUNTER — Telehealth: Payer: Self-pay

## 2019-01-25 ENCOUNTER — Inpatient Hospital Stay: Payer: Medicaid Other

## 2019-01-25 ENCOUNTER — Observation Stay (HOSPITAL_COMMUNITY): Payer: Medicaid Other

## 2019-01-25 ENCOUNTER — Other Ambulatory Visit: Payer: 59

## 2019-01-25 DIAGNOSIS — Z599 Problem related to housing and economic circumstances, unspecified: Secondary | ICD-10-CM | POA: Diagnosis not present

## 2019-01-25 DIAGNOSIS — R443 Hallucinations, unspecified: Secondary | ICD-10-CM | POA: Diagnosis present

## 2019-01-25 DIAGNOSIS — R112 Nausea with vomiting, unspecified: Secondary | ICD-10-CM

## 2019-01-25 DIAGNOSIS — D638 Anemia in other chronic diseases classified elsewhere: Secondary | ICD-10-CM | POA: Diagnosis present

## 2019-01-25 DIAGNOSIS — R531 Weakness: Secondary | ICD-10-CM | POA: Diagnosis present

## 2019-01-25 DIAGNOSIS — F4322 Adjustment disorder with anxiety: Secondary | ICD-10-CM | POA: Diagnosis present

## 2019-01-25 DIAGNOSIS — Z794 Long term (current) use of insulin: Secondary | ICD-10-CM | POA: Diagnosis not present

## 2019-01-25 DIAGNOSIS — I10 Essential (primary) hypertension: Secondary | ICD-10-CM | POA: Diagnosis present

## 2019-01-25 DIAGNOSIS — E119 Type 2 diabetes mellitus without complications: Secondary | ICD-10-CM | POA: Diagnosis present

## 2019-01-25 DIAGNOSIS — D6481 Anemia due to antineoplastic chemotherapy: Secondary | ICD-10-CM | POA: Diagnosis present

## 2019-01-25 DIAGNOSIS — C9000 Multiple myeloma not having achieved remission: Secondary | ICD-10-CM | POA: Diagnosis present

## 2019-01-25 DIAGNOSIS — Z79890 Hormone replacement therapy (postmenopausal): Secondary | ICD-10-CM | POA: Diagnosis not present

## 2019-01-25 DIAGNOSIS — R519 Headache, unspecified: Secondary | ICD-10-CM

## 2019-01-25 DIAGNOSIS — Z7982 Long term (current) use of aspirin: Secondary | ICD-10-CM | POA: Diagnosis not present

## 2019-01-25 DIAGNOSIS — Z91013 Allergy to seafood: Secondary | ICD-10-CM | POA: Diagnosis not present

## 2019-01-25 DIAGNOSIS — Z79899 Other long term (current) drug therapy: Secondary | ICD-10-CM | POA: Diagnosis not present

## 2019-01-25 DIAGNOSIS — Z20828 Contact with and (suspected) exposure to other viral communicable diseases: Secondary | ICD-10-CM | POA: Diagnosis present

## 2019-01-25 DIAGNOSIS — T451X5A Adverse effect of antineoplastic and immunosuppressive drugs, initial encounter: Secondary | ICD-10-CM | POA: Diagnosis present

## 2019-01-25 DIAGNOSIS — D701 Agranulocytosis secondary to cancer chemotherapy: Secondary | ICD-10-CM | POA: Diagnosis present

## 2019-01-25 DIAGNOSIS — F439 Reaction to severe stress, unspecified: Secondary | ICD-10-CM | POA: Diagnosis present

## 2019-01-25 DIAGNOSIS — Z833 Family history of diabetes mellitus: Secondary | ICD-10-CM | POA: Diagnosis not present

## 2019-01-25 LAB — URINE CULTURE: Culture: NO GROWTH

## 2019-01-25 LAB — GLUCOSE, CAPILLARY
Glucose-Capillary: 128 mg/dL — ABNORMAL HIGH (ref 70–99)
Glucose-Capillary: 101 mg/dL — ABNORMAL HIGH (ref 70–99)
Glucose-Capillary: 145 mg/dL — ABNORMAL HIGH (ref 70–99)
Glucose-Capillary: 120 mg/dL — ABNORMAL HIGH (ref 70–99)
Glucose-Capillary: 113 mg/dL — ABNORMAL HIGH (ref 70–99)

## 2019-01-25 LAB — COMPREHENSIVE METABOLIC PANEL
ALT: 12 U/L (ref 0–44)
AST: 10 U/L — ABNORMAL LOW (ref 15–41)
Albumin: 3.2 g/dL — ABNORMAL LOW (ref 3.5–5.0)
Alkaline Phosphatase: 63 U/L (ref 38–126)
Anion gap: 9 (ref 5–15)
BUN: 7 mg/dL (ref 6–20)
CO2: 21 mmol/L — ABNORMAL LOW (ref 22–32)
Calcium: 8.2 mg/dL — ABNORMAL LOW (ref 8.9–10.3)
Chloride: 109 mmol/L (ref 98–111)
Creatinine, Ser: 0.66 mg/dL (ref 0.61–1.24)
GFR calc Af Amer: >60 mL/min (ref >60)
GFR calc non Af Amer: >60 mL/min (ref >60)
Glucose, Bld: 125 mg/dL — ABNORMAL HIGH (ref 70–99)
Potassium: 3.8 mmol/L (ref 3.5–5.1)
Sodium: 139 mmol/L (ref 135–145)
Total Bilirubin: 0.4 mg/dL (ref 0.3–1.2)
Total Protein: 6.6 g/dL (ref 6.5–8.1)

## 2019-01-25 LAB — CBC
HCT: 28 % — ABNORMAL LOW (ref 39.0–52.0)
Hemoglobin: 9.4 g/dL — ABNORMAL LOW (ref 13.0–17.0)
MCH: 31 pg (ref 26.0–34.0)
MCHC: 33.6 g/dL (ref 30.0–36.0)
MCV: 92.4 fL (ref 80.0–100.0)
Platelets: 173 10*3/uL (ref 150–400)
RBC: 3.03 MIL/uL — ABNORMAL LOW (ref 4.22–5.81)
RDW: 14.3 % (ref 11.5–15.5)
WBC: 1.4 10*3/uL — CL (ref 4.0–10.5)
nRBC: 0 % (ref 0.0–0.2)

## 2019-01-25 LAB — HEMOGLOBIN A1C
Hgb A1c MFr Bld: 7.2 % — ABNORMAL HIGH (ref 4.8–5.6)
Mean Plasma Glucose: 159.94 mg/dL

## 2019-01-25 LAB — SARS CORONAVIRUS 2 (TAT 6-24 HRS): SARS Coronavirus 2: NEGATIVE

## 2019-01-25 MED ORDER — PROCHLORPERAZINE EDISYLATE 10 MG/2ML IJ SOLN: 10.0000 mg | Freq: Four times a day (QID) | INTRAMUSCULAR | Status: DC | PRN

## 2019-01-25 MED ORDER — ACETAMINOPHEN 500 MG PO TABS
1000.0000 mg | ORAL_TABLET | Freq: Four times a day (QID) | ORAL | Status: DC | PRN
  Administered 2019-01-25 – 2019-01-26 (×3): 1000 mg via ORAL
  Filled 2019-01-25 (×3): qty 2

## 2019-01-25 NOTE — Progress Notes (Signed)
Pt. complain of pain at IV site. IV site with no redness or swelling, IV removed and IV team called for replacement. Pt. Refused IV replacement and stated that he understood that his doctor has ordered IVF but he do not need the IV despite being educated on the importance of receiving the IVF his doctor has ordered, MD on call notified.Edward Todd

## 2019-01-25 NOTE — Progress Notes (Signed)
These preliminary result these preliminary results were noted.  Awaiting final report.

## 2019-01-25 NOTE — Progress Notes (Signed)
[facility]  PROGRESS NOTE  Edward Todd TGG:[id number] DOB: January 12, 1976 DOA: 01/24/2019 PCP: London Pepper, MD   Brief HPI:   43 y.o. male, with recent diagnosis of multiple myeloma IgG kappa, stage III, who was started on weekly Velcade injection with dexamethasone started on 09/05/2018 and oral Revlimid on 09/12/2018.  Patient's treatment was recently changed to daratumumab, DEXA and Pomalyst in anticipation for bone marrow transplant.  Patient says that he since he changed the treatment plan he has noticed aches and pains.  Patient is on Pomalyst 4 mg 3 weeks on and one-week off.  Currently he is in his off week and is supposed to start tomorrow. As per patient he is complaining of headache, had 2 episodes of vomiting today.  Also had an episode of hallucination where he saw people floating in the air which lasted for about 10 minutes.  He denies photophobia.  No neck stiffness. Patient feels that since his chemotherapy drug was changed he is low in energy.  And he is attributing all his symptoms to change of chemotherapy regimen. He denies chest pain or shortness of breath. Denies fever or chills. Denies coughing up any phlegm. He was recently treated for questionable UTI with p.o. ciprofloxacin. He denies dysuria    Subjective   Patient seen and examined, continues to complain of headache.  Feels more fatigued.   Assessment/Plan:     1. Nausea and vomiting-improved, unclear etiology, likely medication induced.  Continue PRN Zofran.  2. Headache-patient complains of headache with mild photophobia.  Has no signs of meningismus.  Will obtain CT head to rule out underlying intracranial pathology.  Continue PRN oxycodone, Tylenol.  3. Neutropenia-secondary to chemotherapy, started on Granix 40 mcg subcutaneously until absolute neutrophil count greater than 1500.  WBC is 1.4 today.  4. Diabetes mellitus type 2-Metformin is on hold, continue sliding scale insulin with  NovoLog.  5. Hallucinations-patient complains of hallucinations going on for past 2 to 3 weeks.  Getting worse.  Unclear etiology.  Will consult psychiatry for further evaluation.       CBG: Recent Labs  Lab 01/25/19 0208 01/25/19 0740 01/25/19 1102  GLUCAP 128* 101* 145*    CBC: Recent Labs  Lab 01/24/19 1233 01/25/19 0607  WBC 1.5* 1.4*  NEUTROABS 0.1*  --   HGB 10.2* 9.4*  HCT 29.2* 28.0*  MCV 88.8 92.4  PLT 184 703    Basic Metabolic Panel: Recent Labs  Lab 01/24/19 1233 01/25/19 0607  NA 139 139  K 3.6 3.8  CL 107 109  CO2 23 21*  GLUCOSE 117* 125*  BUN 8 7  CREATININE 0.70 0.66  CALCIUM 8.6* 8.2*  MG 1.7  --      DVT prophylaxis: Lovenox  Code Status: Full code  Family Communication: No family at bedside  Disposition Plan: likely home when medically ready for discharge        BMI  Estimated body mass index is 26 kg/m as calculated from the following:   Height as of this encounter: _0  (1.905 m).   Weight as of this encounter: 94.3 kg.  Scheduled medications:  . acyclovir  400 mg Oral BID  . aspirin  81 mg Oral Daily  . calcium-vitamin D  1 tablet Oral BID  . enoxaparin (LOVENOX) injection  40 mg Subcutaneous Q24H  . insulin aspart  0-9 Units Subcutaneous TID WC  . pantoprazole  40 mg Oral Q0600  . potassium chloride  20 mEq Oral Daily  . Tbo-filgastrim (  GRANIX) SQ  480 mcg Subcutaneous q1800    Consultants:  Oncology  Procedures:     Antibiotics:   Anti-infectives (From admission, onward)   Start     Dose/Rate Route Frequency Ordered Stop   01/24/19 2200  acyclovir (ZOVIRAX) tablet 400 mg     400 mg Oral 2 times daily 01/24/19 1739         Objective   Vitals:   01/24/19 1615 01/24/19 1626 01/25/19 0200 01/25/19 0639  BP: (!) 130/93   (!) 141/111  Pulse: 71   77  Resp: 20   18  Temp: 98.3 F (36.8 C)  98 F (36.7 C) 98.2 F (36.8 C)  TempSrc: Oral  Oral Oral  SpO2: 100%   99%  Weight:  94.3 kg     Height:  '6\' 3"'$  (1.905 m)      Intake/Output Summary (Last 24 hours) at 01/25/2019 1521 Last data filed at 01/25/2019 1300 Gross per 24 hour  Intake 480 ml  Output 925 ml  Net -445 ml   Filed Weights   01/24/19 1626  Weight: 94.3 kg     Physical Examination:   General-appears in no acute distress Heart-S1-S2, regular, no murmur auscultated Lungs-clear to auscultation bilaterally, no wheezing or crackles auscultated Abdomen-soft, nontender, no organomegaly Extremities-no edema in the lower extremities Neuro-alert, oriented x3, no focal deficit noted   Data Reviewed: I have personally reviewed following labs and imaging studies   Recent Results (from the past 240 hour(s))  Culture, Blood     Status: None (Preliminary result)   Collection Time: 01/24/19  2:58 PM   Specimen: BLOOD RIGHT HAND  Result Value Ref Range Status   Specimen Description BLOOD RIGHT HAND  Final   Special Requests   Final    BOTTLES DRAWN AEROBIC AND ANAEROBIC Blood Culture adequate volume   Culture   Final    NO GROWTH < 24 HOURS Performed at [facility], [address] [place], [place] [address]    Report Status PENDING  Incomplete  Culture, Blood     Status: None (Preliminary result)   Collection Time: 01/24/19  2:59 PM   Specimen: BLOOD  Result Value Ref Range Status   Specimen Description BLOOD BLOOD RIGHT FOREARM  Final   Special Requests   Final    BOTTLES DRAWN AEROBIC AND ANAEROBIC Blood Culture adequate volume   Culture   Final    NO GROWTH < 24 HOURS Performed at [facility], [place] [address] [place], [place] [address]    Report Status PENDING  Incomplete  Urine Culture     Status: None   Collection Time: 01/24/19  3:20 PM   Specimen: Urine, Clean Catch  Result Value Ref Range Status   Specimen Description   Final    URINE, CLEAN CATCH Performed at [facility], [place] [address], [place], [place] [address]    Special Requests   Final     NONE Performed at [facility], [place] [address], [place], [place] [address]    Culture   Final    NO GROWTH Performed at [facility], [place] [address] [place], [place] [address]    Report Status 01/25/2019 FINAL  Final  SARS CORONAVIRUS 2 (TAT 6-24 HRS) Nasopharyngeal Nasopharyngeal Swab     Status: None   Collection Time: 01/24/19  6:02 PM   Specimen: Nasopharyngeal Swab  Result Value Ref Range Status   SARS Coronavirus  2 NEGATIVE NEGATIVE Final    Comment: (NOTE) SARS-CoV-2 target nucleic acids are NOT DETECTED. The SARS-CoV-2 RNA is generally detectable in upper and lower respiratory specimens during the acute phase of infection. Negative results do not preclude SARS-CoV-2 infection, do not rule out co-infections with other pathogens, and should not be used as the sole basis for treatment or other patient management decisions. Negative results must be combined with clinical observations, patient history, and epidemiological information. The expected result is Negative. Fact Sheet for Patients: SugarRoll.be Fact Sheet for Healthcare Providers: https://www.woods-mathews.com/ This test is not yet approved or cleared by the [place] FDA and  has been authorized for detection and/or diagnosis of SARS-CoV-2 by FDA under an Emergency Use Authorization (EUA). This EUA will remain  in effect (meaning this test can be used) for the duration of the COVID-19 declaration under Section 56 4(b)(1) of the Act, 21 U.S.C. section 360bbb-3(b)(1), unless the authorization is terminated or revoked sooner. Performed at [facility], [place] [address], [place], [place] [address]      Liver Function Tests: Recent Labs  Lab 01/24/19 1233 01/25/19 0607  AST 7* 10*  ALT 9 12  ALKPHOS 82 63  BILITOT 0.4 0.4  PROT 7.3 6.6  ALBUMIN 3.6 3.2*      Studies: Dg Chest 2 View  Result Date: 01/24/2019 CLINICAL DATA:   43 year old male with weakness headache and vomiting. Recent diagnosis of multiple myeloma. EXAM: CHEST - 2 VIEW COMPARISON:  Portable chest 11/30/2018 and earlier. FINDINGS: PA and lateral views of the chest. Improved lung volumes. Normal cardiac size and mediastinal contours. Visualized tracheal air column is within normal limits. Both lungs appear clear. No pneumothorax or pleural effusion. Negative visible bowel gas pattern. Multilevel thoracic compression fractures appear grossly stable from the MRI on 09/01/2018. No new osseous abnormality identified. IMPRESSION: No acute cardiopulmonary abnormality. Electronically Signed   By: Genevie Ann M.D.   On: 01/24/2019 19:04   Ct Head Wo Contrast  Result Date: 01/25/2019 CLINICAL DATA:  Headache with nausea and vomiting. Recent hallucinations EXAM: CT HEAD WITHOUT CONTRAST TECHNIQUE: Contiguous axial images were obtained from the base of the skull through the vertex without intravenous contrast. COMPARISON:  None. FINDINGS: Brain: Ventricles are normal in size and configuration. Prominence of the cisterna magna is an anatomic variant. There is no intracranial mass, hemorrhage, extra-axial fluid collection, or midline shift. Brain parenchyma appears unremarkable. No demonstrable acute infarct. Vascular: There is no hyperdense vessel. There is no appreciable vascular calcification. Skull: Bony calvarium appears intact. Sinuses/Orbits: Visualized paranasal sinuses are clear. Orbits appear symmetric bilaterally. Other: Mastoid air cells are clear. IMPRESSION: Study within normal limits. Electronically Signed   By: Lowella Grip III M.D.   On: 01/25/2019 12:23     Admission status: Inpatient: Based on patients clinical presentation and evaluation of above clinical data, I have made determination that patient meets Inpatient criteria at this time.  Time spent: 20 min  [facility] Pager [phone]. If 7PM-7AM, please contact  night-coverage at [email or web address], Office  [phone]  password [email or web address]  01/25/2019, 3:21 PM  LOS: 1 day

## 2019-01-25 NOTE — Progress Notes (Signed)
CRITICAL VALUE ALERT  Critical Value:  WBC 1.4  Date & Time Notified: 10/2 0644  Provider Notified: MD aware  Orders Received/Actions taken: n/a

## 2019-01-25 NOTE — Telephone Encounter (Signed)
Oral Oncology Patient Advocate Encounter  I received notification that the patient no longer has insurance.  I called the patient and confirmed this information. I explained that for his [place] we can apply to [place] patient assistance in an attempt to get his [place] at $0 out of pocket cost to him.  He agreed to apply. I emailed the signature page to him and he sent that back to me signed along with his [place].  I faxed the completed application today, [phone].  This encounter will be updated until final determination.  [place] Patient [place] Phone [phone] Fax [phone] 01/25/2019   11:21 AM

## 2019-01-25 NOTE — Progress Notes (Addendum)
HEMATOLOGY-ONCOLOGY PROGRESS NOTE  SUBJECTIVE: Edward Todd has been admitted secondary to weakness, headaches, nausea vomiting.  When seen today, he reports having a significant headache.  He does not want take his oxycodone because he is afraid to take it.  States that the headache started in the past few days.  He relates his headaches to the chemotherapy however, he has been off the Pomalyst almost a week at this point.  He did not really have any headaches while he was on his chemotherapy.  The patient states that his nausea and vomiting are better.  He was taking increased doses of Zofran at home the past few days.  Denies fevers and chills.  No neck pain.  Patient states that he feels very stressed and nurse reports that he had some hallucinations last night.  This morning, he is not having any hallucinations but states that he feels "aggression."  Oncology History Overview Note  Cancer Staging Multiple myeloma ([place]) Staging form: Plasma Cell Myeloma and Plasma Cell Disorders, AJCC 8th Edition - Clinical stage from 09/04/2018: RISS Stage III (Beta-2-microglobulin (mg/L): 5.5, Albumin (g/dL): 2.6, ISS: Stage III, High-risk cytogenetics: Absent, LDH: Elevated) - Signed by Truitt Merle, MD on 09/26/2018     Multiple myeloma ([place])  08/31/2018 Initial Diagnosis   Multiple myeloma ([place])   09/04/2018 Cancer Staging   Staging form: Plasma Cell Myeloma and Plasma Cell Disorders, AJCC 8th Edition - Clinical stage from 09/04/2018: RISS Stage III (Beta-2-microglobulin (mg/L): 5.5, Albumin (g/dL): 2.6, ISS: Stage III, High-risk cytogenetics: Absent, LDH: Elevated) - Signed by Truitt Merle, MD on 09/26/2018   09/04/2018 Initial Biopsy   Diagnosis 09/04/18 Bone Marrow, Aspirate,Biopsy, and Clot, left posterior iliac crest BONE MARROW: - CELLULAR MARROW WITH INVOLVEMENT BY PLASMA CELL NEOPLASM (>95%) - SEE COMMENT PERIPHERAL BLOOD: - NORMOCYTIC ANEMIA - ROULEAUX FORMATION - SEE COMPLETE BLOOD COUNT    09/05/2018 - 12/28/2018 Chemotherapy   Weekly velcade injection and dexamethasone 64m starting 09/05/18             -increase Velcade injection to twice a week (Fridays/Mondays) for 2 weeks on, 1 week off starting 10/19/18. Will return to weekly Velcade starting 11/09/18.              -Dexa stopped 12/14/18 due to hyperglycemia. Restart on 9/4 at 257mWeekly.              -Stop Velcade and change Dexa after 12/28/18.    09/14/2018 - 12/21/2018 Chemotherapy   Revlimid 2565m weeks on/1 week off starting 09/14/18. Stopped 12/21/18 due to abdominal pain.     12/29/2018 -  Chemotherapy   Pomalyst 4mg81mweeks on/ 1 week off starting 12/29/18.    01/04/2019 -  Chemotherapy   IV weekly Daratumumab and oral Dexa 8mg 52m before infusion starting 01/04/19      REVIEW OF SYSTEMS:   As noted in the HPI.  I have reviewed the past medical history, past surgical history, social history and family history with the patient and they are unchanged from previous note.   PHYSICAL EXAMINATION: ECOG PERFORMANCE STATUS: 3 - Symptomatic, >50% confined to bed  Vitals:   01/25/19 0200 01/25/19 0639  BP:  (!) 141/111  Pulse:  77  Resp:  18  Temp: 98 F (36.7 C) 98.2 F (36.8 C)  SpO2:  99%   Filed Weights   01/24/19 1626  Weight: 208 lb (94.3 kg)    Intake/Output from previous day: 10/01 0701 - 10/02 0700 In: -  Out: 425 [Urine:425]  GENERAL: Awake and alert, is holding his head at times secondary to headache SKIN: skin color, texture, turgor are normal, no rashes or significant lesions EYES: normal, Conjunctiva are pink and non-injected, sclera clear OROPHARYNX:no exudate, no erythema and lips, buccal mucosa, and tongue normal  NECK: supple, thyroid normal size, non-tender, without nodularity.  No nuchal rigidity. LYMPH:  no palpable lymphadenopathy in the cervical, axillary or inguinal LUNGS: clear to auscultation and percussion with normal breathing effort HEART: regular rate & rhythm and no murmurs  and no lower extremity edema ABDOMEN:abdomen soft, non-tender and normal bowel sounds Musculoskeletal:no cyanosis of digits and no clubbing  NEURO: alert & oriented x 3 with fluent speech, no focal motor/sensory deficits  LABORATORY DATA:  I have reviewed the data as listed CMP Latest Ref Rng & Units 01/25/2019 01/24/2019 01/18/2019  Glucose 70 - 99 mg/dL 125(H) 117(H) 173(H)  BUN 6 - 20 mg/dL _0 Creatinine 0.61 - 1.24 mg/dL 0.66 0.70 0.73  Sodium 135 - 145 mmol/L 139 139 140  Potassium 3.5 - 5.1 mmol/L 3.8 3.6 4.0  Chloride 98 - 111 mmol/L 109 107 110  CO2 22 - 32 mmol/L 21(L) 23 20(L)  Calcium 8.9 - 10.3 mg/dL 8.2(L) 8.6(L) 9.2  Total Protein 6.5 - 8.1 g/dL 6.6 7.3 7.5  Total Bilirubin 0.3 - 1.2 mg/dL 0.4 0.4 0.4  Alkaline Phos 38 - 126 U/L 63 82 77  AST 15 - 41 U/L 10(L) 7(L) 9(L)  ALT 0 - 44 U/L _1 Lab Results  Component Value Date   WBC 1.4 (LL) 01/25/2019   HGB 9.4 (L) 01/25/2019   HCT 28.0 (L) 01/25/2019   MCV 92.4 01/25/2019   PLT 173 01/25/2019   NEUTROABS 0.1 (LL) 01/24/2019    Dg Chest 2 View  Result Date: 01/24/2019 CLINICAL DATA:  43 year old male with weakness headache and vomiting. Recent diagnosis of multiple myeloma. EXAM: CHEST - 2 VIEW COMPARISON:  Portable chest 11/30/2018 and earlier. FINDINGS: PA and lateral views of the chest. Improved lung volumes. Normal cardiac size and mediastinal contours. Visualized tracheal air column is within normal limits. Both lungs appear clear. No pneumothorax or pleural effusion. Negative visible bowel gas pattern. Multilevel thoracic compression fractures appear grossly stable from the MRI on 09/01/2018. No new osseous abnormality identified. IMPRESSION: No acute cardiopulmonary abnormality. Electronically Signed   By: Genevie Ann M.D.   On: 01/24/2019 19:04    ASSESSMENT AND PLAN: 1.  Multiple myeloma, IgG kappa, stage III, trisomy 11, standard risk 2.  Anemia neutropenia secondary to recent chemotherapy 3.   Headaches 4.  Nausea and vomiting 5.  Diabetes 6.  Hallucinations, resolved 7.  Stress due to cancer diagnosis and financial issues  -Hemoglobin is stable.  No transfusion indicated.  Continue Granix until [place] is above 1500. -I am unclear of the cause of the headaches.  I do not think that is due to the recent chemotherapy as the headaches started after he completed his recent chemotherapy.  I would consider getting a CT of the head to evaluate for any abnormality.  Unclear if this is medication related as he has been taking increasing doses of Zofran recently secondary to nausea and vomiting.  For now, I will discontinue the Zofran and try Compazine as needed instead.  I encouraged him to take his oxycodone but he is fearful of taking this.  He is agreeable to taking Tylenol and I have adjusted the dose to  1000 mg every 6 hours as needed. -Continue insulin per hospitalist for diabetes -The patient reported hallucinations and has requested evaluation by psychiatry secondary to his ongoing stress.  Will defer to hospitalist regarding whether psychiatry should be consulted this admission.  The cancer center social worker is attempting to assist with his financial concerns.   LOS: 1 day   Mikey Bussing, DNP, AGPCNP-BC, AOCNP 01/25/19  Addendum  I have seen the patient, examined him. I agree with the assessment and and plan and have edited the notes.   Pt is overall doing better than yesterday, still has mild headaches photophobia resolved.  He has been up walking, no issues with balance.  He still has occasional hallucination, and his cognitive function maybe slightly impaired, but he is oriented X3. His CT head wo contrast was negative, I discussed with pt. Pt feels his neurological symptoms are related to Pomalyst, which he stopped a week ago. This is certainly still possible, although uncommon. He is still very neutropenic, on granix, will continue. NO fever or other signs of infection, UA  yesterday was negative, cultures are pending. He is not on antibiotics.   If his neutropenia improves in next few days, above 1K, and he remains afebrile, with negative cultures, I think it is okay to discharge him over the weekend. I will f/u him in my clinic next week.   I spoke with Dr. Darrick Meigs this morning. I appreciate the excellent care from hospitalist team.   Truitt Merle  01/25/2019

## 2019-01-25 NOTE — Progress Notes (Signed)
Pt woke up and called RN to the room. He stated his mind is racing, he feels nauseous, and has a headache. He is pacing the room, states it helps to walk around and he cannot lie still. He stated this happened last night too, and these are the only times it has happened. He states it is because of his "meds". He did take oxy and robaxin at bedtime, but he says it is not these meds. He clarified that he is referring to his chemo and he has not felt right since changes were made to his regimen. Pt given zofran, VS stable. Continue to monitor. Hortencia Conradi RN

## 2019-01-26 DIAGNOSIS — F4322 Adjustment disorder with anxiety: Secondary | ICD-10-CM

## 2019-01-26 DIAGNOSIS — R443 Hallucinations, unspecified: Secondary | ICD-10-CM

## 2019-01-26 DIAGNOSIS — Z79899 Other long term (current) drug therapy: Secondary | ICD-10-CM

## 2019-01-26 DIAGNOSIS — C9000 Multiple myeloma not having achieved remission: Secondary | ICD-10-CM

## 2019-01-26 LAB — GLUCOSE, CAPILLARY
Glucose-Capillary: 98 mg/dL (ref 70–99)
Glucose-Capillary: 139 mg/dL — ABNORMAL HIGH (ref 70–99)

## 2019-01-26 NOTE — Consult Note (Signed)
Telepsych Consultation   Reason for Consult:  ''Hallucination, history of multiple myeloma on chemotherapy.'' Referring Physician:  Dr. Darrick Meigs Location of Patient: [facility] floor Location of Provider: [facility]  Patient Identification: Edward Todd MRN:  [id number] Principal Diagnosis: Adjustment disorder with anxiety Diagnosis:  Principal Problem:   Adjustment disorder with anxiety Active Problems:   Nausea & vomiting   Acute nonintractable headache   Total Time spent with patient: 45 minutes  Subjective:   Edward Todd is a 43 y.o. male patient admitted secondary to weakness, headaches, nausea vomiting.  HPI: 43 y.o.male,with recent diagnosis of multiple myeloma IgG kappa, stage III,who was started on weekly Velcade injection with dexamethasone started on 09/05/2018 and oral Revlimid on 09/12/2018. Patient's treatment was recently changed to daratumumab, DEXA and Pomalyst in anticipation for bone marrow transplant.  Patient assessed via tele health for hallucination as requested by his doctor. He denies any prior history of mental illness, psychosis, delusions, suicidal ideation, intent or plan. But he reports occasional stress, anxiety, apprehension about his medical illness. As a result, he is requesting to talk to someone on a regular basis. He states that he is not interested in psychotropic medications but just wants to talk to a professional counselor or therapist.  Past Psychiatric History:   Risk to Self:  denies Risk to Others:  denies Prior Inpatient Therapy:  denies Prior Outpatient Therapy:  none  Past Medical History:  Past Medical History:  Diagnosis Date  . Cancer ([place])   . Diabetes mellitus without complication ([place])   . Hypertension    History reviewed. No pertinent surgical history. Family History:  Family History  Problem Relation Age of Onset  . Diabetes Maternal Grandmother   . Diabetes Paternal Grandfather    Family  Psychiatric  History: Social History:  Social History   Substance and Sexual Activity  Alcohol Use No  . Alcohol/week: 0.0 standard drinks     Social History   Substance and Sexual Activity  Drug Use No    Social History   Socioeconomic History  . Marital status: Married    Spouse name: Not on file  . Number of children: 1  . Years of education: Not on file  . Highest education level: Not on file  Occupational History  . Not on file  Social Needs  . Financial resource strain: Not on file  . Food insecurity    Worry: Not on file    Inability: Not on file  . Transportation needs    Medical: Not on file    Non-medical: Not on file  Tobacco Use  . Smoking status: Never Smoker  . Smokeless tobacco: Never Used  Substance and Sexual Activity  . Alcohol use: No    Alcohol/week: 0.0 standard drinks  . Drug use: No  . Sexual activity: Yes  Lifestyle  . Physical activity    Days per week: Not on file    Minutes per session: Not on file  . Stress: Not on file  Relationships  . Social Herbalist on phone: Not on file    Gets together: Not on file    Attends religious service: Not on file    Active member of club or organization: Not on file    Attends meetings of clubs or organizations: Not on file    Relationship status: Not on file  Other Topics Concern  . Not on file  Social History Narrative   Marital status:  married  Children:  1 child (60yo)      Lives: with son and wife.      Employment: [employer] x 2 months. Moved from [place].      Tobacco:  None      Alcohol: none      Drugs: none      Exercise:  Three days per week.       Seatbelt:  100%; some texting n driving.      Guns:  None      Sexual activity:  5; no STDs; females only.   Education: Architect Social History:    Allergies:   Allergies  Allergen Reactions  . Shellfish Allergy Rash    Labs:  Results for orders placed or performed during the  hospital encounter of 01/24/19 (from the past 48 hour(s))  SARS CORONAVIRUS 2 (TAT 6-24 HRS) Nasopharyngeal Nasopharyngeal Swab     Status: None   Collection Time: 01/24/19  6:02 PM   Specimen: Nasopharyngeal Swab  Result Value Ref Range   SARS Coronavirus 2 NEGATIVE NEGATIVE    Comment: (NOTE) SARS-CoV-2 target nucleic acids are NOT DETECTED. The SARS-CoV-2 RNA is generally detectable in upper and lower respiratory specimens during the acute phase of infection. Negative results do not preclude SARS-CoV-2 infection, do not rule out co-infections with other pathogens, and should not be used as the sole basis for treatment or other patient management decisions. Negative results must be combined with clinical observations, patient history, and epidemiological information. The expected result is Negative. Fact Sheet for Patients: SugarRoll.be Fact Sheet for Healthcare Providers: https://www.woods-mathews.com/ This test is not yet approved or cleared by the [place] FDA and  has been authorized for detection and/or diagnosis of SARS-CoV-2 by FDA under an Emergency Use Authorization (EUA). This EUA will remain  in effect (meaning this test can be used) for the duration of the COVID-19 declaration under Section 56 4(b)(1) of the Act, 21 U.S.C. section 360bbb-3(b)(1), unless the authorization is terminated or revoked sooner. Performed at [facility], [place] [address] [place], [place] [address]   Glucose, capillary     Status: Abnormal   Collection Time: 01/25/19  2:08 AM  Result Value Ref Range   Glucose-Capillary 128 (H) 70 - 99 mg/dL   Comment 1 Notify RN    Comment 2 Document in Chart   CBC     Status: Abnormal   Collection Time: 01/25/19  6:07 AM  Result Value Ref Range   WBC 1.4 (LL) 4.0 - 10.5 K/uL    Comment: This critical result has verified and been called to AUBURN, S. RN by Raelyn Ensign on 10 02 2020 at [phone], and has been  read back. Critical Result Verified This critical result has verified and been called to AUBURN, S. RN by Roselyn Meier on 10 02 2020 at [phone], and has been read back. Critical Result Verified    RBC 3.03 (L) 4.22 - 5.81 MIL/uL   Hemoglobin 9.4 (L) 13.0 - 17.0 g/dL   HCT 28.0 (L) 39.0 - 52.0 %   MCV 92.4 80.0 - 100.0 fL   MCH 31.0 26.0 - 34.0 pg   MCHC 33.6 30.0 - 36.0 g/dL   RDW 14.3 11.5 - 15.5 %   Platelets 173 150 - 400 K/uL   nRBC 0.0 0.0 - 0.2 %    Comment: Performed at [facility], [place] [address], [place], [place] [address]  Comprehensive metabolic panel     Status: Abnormal   Collection Time: 01/25/19  6:07 AM  Result Value Ref Range   Sodium 139 135 - 145 mmol/L   Potassium 3.8 3.5 - 5.1 mmol/L   Chloride 109 98 - 111 mmol/L   CO2 21 (L) 22 - 32 mmol/L   Glucose, Bld 125 (H) 70 - 99 mg/dL   BUN 7 6 - 20 mg/dL   Creatinine, Ser 0.66 0.61 - 1.24 mg/dL   Calcium 8.2 (L) 8.9 - 10.3 mg/dL   Total Protein 6.6 6.5 - 8.1 g/dL   Albumin 3.2 (L) 3.5 - 5.0 g/dL   AST 10 (L) 15 - 41 U/L   ALT 12 0 - 44 U/L   Alkaline Phosphatase 63 38 - 126 U/L   Total Bilirubin 0.4 0.3 - 1.2 mg/dL   GFR calc non Af Amer >60 >60 mL/min   GFR calc Af Amer >60 >60 mL/min   Anion gap 9 5 - 15    Comment: Performed at [facility], [place] [address] [place], [place] [address]  Hemoglobin A1c     Status: Abnormal   Collection Time: 01/25/19  6:07 AM  Result Value Ref Range   Hgb A1c MFr Bld 7.2 (H) 4.8 - 5.6 %    Comment: (NOTE) Pre diabetes:          5.7%-6.4% Diabetes:              >6.4% Glycemic control for   <7.0% adults with diabetes    Mean Plasma Glucose 159.94 mg/dL    Comment: Performed at [place] [address] [place], [place] [address]  Glucose, capillary     Status: Abnormal   Collection Time: 01/25/19  7:40 AM  Result Value Ref Range   Glucose-Capillary 101 (H) 70 - 99 mg/dL   Comment 1 Notify RN    Comment 2 Document in Chart    Glucose, capillary     Status: Abnormal   Collection Time: 01/25/19 11:02 AM  Result Value Ref Range   Glucose-Capillary 145 (H) 70 - 99 mg/dL   Comment 1 Notify RN    Comment 2 Document in Chart   Glucose, capillary     Status: Abnormal   Collection Time: 01/25/19  3:50 PM  Result Value Ref Range   Glucose-Capillary 120 (H) 70 - 99 mg/dL  Glucose, capillary     Status: Abnormal   Collection Time: 01/25/19  8:09 PM  Result Value Ref Range   Glucose-Capillary 113 (H) 70 - 99 mg/dL   Comment 1 Notify RN   Glucose, capillary     Status: None   Collection Time: 01/26/19  7:44 AM  Result Value Ref Range   Glucose-Capillary 98 70 - 99 mg/dL   Comment 1 Notify RN    Comment 2 Document in Chart   Glucose, capillary     Status: Abnormal   Collection Time: 01/26/19 11:53 AM  Result Value Ref Range   Glucose-Capillary 139 (H) 70 - 99 mg/dL   Comment 1 Notify RN    Comment 2 Document in Chart     Medications:  Current Facility-Administered Medications  Medication Dose Route Frequency Provider Last Rate Last Dose  . 0.9 %  sodium chloride infusion   Intravenous Continuous Oswald Hillock, MD 75 mL/hr at 01/24/19 1850    . acetaminophen (TYLENOL) tablet 1,000 mg  1,000 mg Oral Q6H PRN Maryanna Shape, NP   1,000  mg at 01/26/19 0641  . acyclovir (ZOVIRAX) tablet 400 mg  400 mg Oral BID Oswald Hillock, MD   400 mg at 01/26/19 1035  . aspirin chewable tablet 81 mg  81 mg Oral Daily Oswald Hillock, MD   81 mg at 01/26/19 1036  . calcium-vitamin D (OSCAL WITH D) 500-200 MG-UNIT per tablet 1 tablet  1 tablet Oral BID Oswald Hillock, MD   1 tablet at 01/26/19 1035  . enoxaparin (LOVENOX) injection 40 mg  40 mg Subcutaneous Q24H Oswald Hillock, MD   40 mg at 01/25/19 2113  . insulin aspart (novoLOG) injection 0-9 Units  0-9 Units Subcutaneous TID WC Oswald Hillock, MD   1 Units at 01/26/19 1219  . methocarbamol (ROBAXIN) tablet 750 mg  750 mg Oral TID PRN Oswald Hillock, MD   750 mg at 01/24/19 2202  .  metoCLOPramide (REGLAN) tablet 10 mg  10 mg Oral Q8H PRN Oswald Hillock, MD      . oxyCODONE (Oxy IR/ROXICODONE) immediate release tablet 5 mg  5 mg Oral Q8H PRN Oswald Hillock, MD   5 mg at 01/24/19 2203  . pantoprazole (PROTONIX) EC tablet 40 mg  40 mg Oral Q0600 Oswald Hillock, MD   40 mg at 01/25/19 0801  . polyethylene glycol (MIRALAX / GLYCOLAX) packet 17 g  17 g Oral Daily PRN Oswald Hillock, MD      . potassium chloride (KLOR-CON) packet 20 mEq  20 mEq Oral Daily Oswald Hillock, MD   20 mEq at 01/26/19 1036  . prochlorperazine (COMPAZINE) injection 10 mg  10 mg Intravenous Q6H PRN Curcio, Kristin R, NP      . senna-docusate (Senokot-S) tablet 1 tablet  1 tablet Oral BID PRN Oswald Hillock, MD      . Tbo-Filgrastim [facility]) injection 480 mcg  480 mcg Subcutaneous q1800 Oswald Hillock, MD   480 mcg at 01/25/19 1902    Musculoskeletal: Strength & Muscle Tone: not tested Gait & Station: not tested Patient leans: N/A  Psychiatric Specialty Exam: Physical Exam  Psychiatric: His speech is normal and behavior is normal. Judgment and thought content normal. His mood appears anxious. Cognition and memory are normal.    Review of Systems  Psychiatric/Behavioral: The patient is nervous/anxious.     Blood pressure (!) 141/111, pulse 77, temperature 98.2 F (36.8 C), temperature source Oral, resp. rate 18, height _0  (1.905 m), weight 94.3 kg, SpO2 99 %.Body mass index is 26 kg/m.  General Appearance: Casual  Eye Contact:  Good  Speech:  Clear and Coherent  Volume:  Normal  Mood:  Dysphoric  Affect:  anxious  Thought Process:  Coherent and Linear  Orientation:  Full (Time, Place, and Person)  Thought Content:  Logical  Suicidal Thoughts:  No  Homicidal Thoughts:  No  Memory:  Immediate;   Good Recent;   Good Remote;   Good  Judgement:  Intact  Insight:  Fair  Psychomotor Activity:  Normal  Concentration:  Concentration: Fair and Attention Span: Fair  Recall:  Good  Fund of Knowledge:   Good  Language:  Good  Akathisia:  No  Handed:  Right  AIMS (if indicated):     Assets:  Communication Skills Desire for Improvement Social Support  ADL's:  Intact  Cognition:  WNL  Sleep:   fair     Treatment Plan Summary: 43 y.o.male,with recent diagnosis of multiple myeloma IgG kappa who denies prior  history of mental illness.  He was referred for evaluation of hallucination but patient denies psychosis, delusions, suicidal ideations, intent or plan. But he reports being apprehensive and stressed out about his medical illness. And as such, he is requesting to talk on a regular basis to a professional counselor/therapist.   Recommendations: -Consider social worker consult to facilitate referring patient to a counselor/therapist.    Disposition: No evidence of imminent risk to self or others at present.   Patient does not meet criteria for psychiatric inpatient admission. Supportive therapy provided about ongoing stressors. Psychiatric service signing out as patient refused medication management. Re-consult psych if patient wants medication management.  This service was provided via telemedicine using a 2-way, interactive audio and video technology.  Names of all persons participating in this telemedicine service and their role in this encounter. Name: Edward Todd Role: patient  Name: Wyatt Portela Role: RN  Name: Corena Pilgrim, MD Role: Psychiatrist  Name:  Role:     Corena Pilgrim, MD 01/26/2019 3:31 PM

## 2019-01-26 NOTE — Progress Notes (Signed)
Patient wanted to leave AMA. Talked to doctor and advised patient of low WBC which puts him at extreme risk for infection and the doctor wanting to monitor him. Dr. Darrick Meigs talked to patient and he still wanted to leave AMA. Patient signed AMA papers and walked out.

## 2019-01-26 NOTE — Progress Notes (Signed)
[facility]  PROGRESS NOTE  Edward Todd YKZ:[id number] DOB: 1975-10-03 DOA: 01/24/2019 PCP: London Pepper, MD   Brief HPI:   43 y.o. male, with recent diagnosis of multiple myeloma IgG kappa, stage III, who was started on weekly Velcade injection with dexamethasone started on 09/05/2018 and oral Revlimid on 09/12/2018.  Patient's treatment was recently changed to daratumumab, DEXA and Pomalyst in anticipation for bone marrow transplant.  Patient says that he since he changed the treatment plan he has noticed aches and pains.  Patient is on Pomalyst 4 mg 3 weeks on and one-week off.  Currently he is in his off week and is supposed to start tomorrow. As per patient he is complaining of headache, had 2 episodes of vomiting today.  Also had an episode of hallucination where he saw people floating in the air which lasted for about 10 minutes.  He denies photophobia.  No neck stiffness. Patient feels that since his chemotherapy drug was changed he is low in energy.  And he is attributing all his symptoms to change of chemotherapy regimen. He denies chest pain or shortness of breath. Denies fever or chills. Denies coughing up any phlegm. He was recently treated for questionable UTI with p.o. ciprofloxacin. He denies dysuria    Subjective   Patient seen and examined, feeling better today.  Headache has improved.  No more hallucinations.  Psychiatry was consulted yesterday.  Consult is still pending.  He refused CBC this morning   Assessment/Plan:     1. Nausea and vomiting-improved, unclear etiology, likely medication induced.  Continue PRN Zofran.  2. Headache-patient complains of headache with mild photophobia.  Has no signs of meningismus.  Will obtain CT head to rule out underlying intracranial pathology.  Continue PRN oxycodone, Tylenol.  3. Neutropenia-secondary to chemotherapy, started on Granix 40 mcg subcutaneously until absolute neutrophil count greater than 1500.  WBC was  1.4 yesterday.  Continue Granix.  Patient refused CBC this morning.  Will follow CBC.  4. Diabetes mellitus type 2-Metformin is on hold, continue sliding scale insulin with NovoLog.  5. Hallucinations-patient complains of hallucinations going on for past 2 to 3 weeks.  Getting worse.  Unclear etiology.  Psychiatry has been consulted.  Will follow the recommendations.    CBG: Recent Labs  Lab 01/25/19 1102 01/25/19 1550 01/25/19 2009 01/26/19 0744 01/26/19 1153  GLUCAP 145* 120* 113* 98 139*    CBC: Recent Labs  Lab 01/24/19 1233 01/25/19 0607  WBC 1.5* 1.4*  NEUTROABS 0.1*  --   HGB 10.2* 9.4*  HCT 29.2* 28.0*  MCV 88.8 92.4  PLT 184 939    Basic Metabolic Panel: Recent Labs  Lab 01/24/19 1233 01/25/19 0607  NA 139 139  K 3.6 3.8  CL 107 109  CO2 23 21*  GLUCOSE 117* 125*  BUN 8 7  CREATININE 0.70 0.66  CALCIUM 8.6* 8.2*  MG 1.7  --      DVT prophylaxis: Lovenox  Code Status: Full code  Family Communication: No family at bedside  Disposition Plan: likely home when medically ready for discharge        BMI  Estimated body mass index is 26 kg/m as calculated from the following:   Height as of this encounter: '6\' 3"'  (1.905 m).   Weight as of this encounter: 94.3 kg.  Scheduled medications:  . acyclovir  400 mg Oral BID  . aspirin  81 mg Oral Daily  . calcium-vitamin D  1 tablet Oral BID  . enoxaparin (  LOVENOX) injection  40 mg Subcutaneous Q24H  . insulin aspart  0-9 Units Subcutaneous TID WC  . pantoprazole  40 mg Oral Q0600  . potassium chloride  20 mEq Oral Daily  . Tbo-filgastrim (GRANIX) SQ  480 mcg Subcutaneous q1800    Consultants:  Oncology  Procedures:     Antibiotics:   Anti-infectives (From admission, onward)   Start     Dose/Rate Route Frequency Ordered Stop   01/24/19 2200  acyclovir (ZOVIRAX) tablet 400 mg     400 mg Oral 2 times daily 01/24/19 1739         Objective   Vitals:   01/24/19 1615 01/24/19 1626  01/25/19 0200 01/25/19 0639  BP: (!) 130/93   (!) 141/111  Pulse: 71   77  Resp: 20   18  Temp: 98.3 F (36.8 C)  98 F (36.7 C) 98.2 F (36.8 C)  TempSrc: Oral  Oral Oral  SpO2: 100%   99%  Weight:  94.3 kg    Height:  '6\' 3"'  (1.905 m)      Intake/Output Summary (Last 24 hours) at 01/26/2019 1514 Last data filed at 01/26/2019 0900 Gross per 24 hour  Intake 380 ml  Output 750 ml  Net -370 ml   Filed Weights   01/24/19 1626  Weight: 94.3 kg     Physical Examination:  General-appears in no acute distress Heart-S1-S2, regular, no murmur auscultated Lungs-clear to auscultation bilaterally, no wheezing or crackles auscultated Abdomen-soft, nontender, no organomegaly Extremities-no edema in the lower extremities Neuro-alert, oriented x3, no focal deficit noted   Data Reviewed: I have personally reviewed following labs and imaging studies   Recent Results (from the past 240 hour(s))  Culture, Blood     Status: None (Preliminary result)   Collection Time: 01/24/19  2:58 PM   Specimen: BLOOD RIGHT HAND  Result Value Ref Range Status   Specimen Description BLOOD RIGHT HAND  Final   Special Requests   Final    BOTTLES DRAWN AEROBIC AND ANAEROBIC Blood Culture adequate volume   Culture   Final    NO GROWTH 2 DAYS Performed at [facility], [address] [place], [place] [address]    Report Status PENDING  Incomplete  Culture, Blood     Status: None (Preliminary result)   Collection Time: 01/24/19  2:59 PM   Specimen: BLOOD  Result Value Ref Range Status   Specimen Description BLOOD BLOOD RIGHT FOREARM  Final   Special Requests   Final    BOTTLES DRAWN AEROBIC AND ANAEROBIC Blood Culture adequate volume   Culture   Final    NO GROWTH 2 DAYS Performed at [facility], [place] [address], [place], [place] [address]    Report Status PENDING  Incomplete  Urine Culture     Status: None   Collection Time: 01/24/19  3:20 PM   Specimen: Urine, Clean Catch  Result  Value Ref Range Status   Specimen Description   Final    URINE, CLEAN CATCH Performed at [facility], [place] [address], [place], [place] [address]    Special Requests   Final    NONE Performed at [facility], [place] [address] [place], [place] [address]    Culture   Final    NO GROWTH Performed at [facility], [place] [address], [place], [place] [address]    Report Status 01/25/2019 FINAL  Final  SARS CORONAVIRUS 2 (TAT  6-24 HRS) Nasopharyngeal Nasopharyngeal Swab     Status: None   Collection Time: 01/24/19  6:02 PM   Specimen: Nasopharyngeal Swab  Result Value Ref Range Status   SARS Coronavirus 2 NEGATIVE NEGATIVE Final    Comment: (NOTE) SARS-CoV-2 target nucleic acids are NOT DETECTED. The SARS-CoV-2 RNA is generally detectable in upper and lower respiratory specimens during the acute phase of infection. Negative results do not preclude SARS-CoV-2 infection, do not rule out co-infections with other pathogens, and should not be used as the sole basis for treatment or other patient management decisions. Negative results must be combined with clinical observations, patient history, and epidemiological information. The expected result is Negative. Fact Sheet for Patients: SugarRoll.be Fact Sheet for Healthcare Providers: https://www.woods-mathews.com/ This test is not yet approved or cleared by the [place] FDA and  has been authorized for detection and/or diagnosis of SARS-CoV-2 by FDA under an Emergency Use Authorization (EUA). This EUA will remain  in effect (meaning this test can be used) for the duration of the COVID-19 declaration under Section 56 4(b)(1) of the Act, 21 U.S.C. section 360bbb-3(b)(1), unless the authorization is terminated or revoked sooner. Performed at [facility], [place] [address] [place],  [address]      Liver Function Tests: Recent  Labs  Lab 01/24/19 1233 01/25/19 0607  AST 7* 10*  ALT 9 12  ALKPHOS 82 63  BILITOT 0.4 0.4  PROT 7.3 6.6  ALBUMIN 3.6 3.2*      Studies: Dg Chest 2 View  Result Date: 01/24/2019 CLINICAL DATA:  43 year old male with weakness headache and vomiting. Recent diagnosis of multiple myeloma. EXAM: CHEST - 2 VIEW COMPARISON:  Portable chest 11/30/2018 and earlier. FINDINGS: PA and lateral views of the chest. Improved lung volumes. Normal cardiac size and mediastinal contours. Visualized tracheal air column is within normal limits. Both lungs appear clear. No pneumothorax or pleural effusion. Negative visible bowel gas pattern. Multilevel thoracic compression fractures appear grossly stable from the MRI on 09/01/2018. No new osseous abnormality identified. IMPRESSION: No acute cardiopulmonary abnormality. Electronically Signed   By: Genevie Ann M.D.   On: 01/24/2019 19:04   Ct Head Wo Contrast  Result Date: 01/25/2019 CLINICAL DATA:  Headache with nausea and vomiting. Recent hallucinations EXAM: CT HEAD WITHOUT CONTRAST TECHNIQUE: Contiguous axial images were obtained from the base of the skull through the vertex without intravenous contrast. COMPARISON:  None. FINDINGS: Brain: Ventricles are normal in size and configuration. Prominence of the cisterna magna is an anatomic variant. There is no intracranial mass, hemorrhage, extra-axial fluid collection, or midline shift. Brain parenchyma appears unremarkable. No demonstrable acute infarct. Vascular: There is no hyperdense vessel. There is no appreciable vascular calcification. Skull: Bony calvarium appears intact. Sinuses/Orbits: Visualized paranasal sinuses are clear. Orbits appear symmetric bilaterally. Other: Mastoid air cells are clear. IMPRESSION: Study within normal limits. Electronically Signed   By: Lowella Grip III M.D.   On: 01/25/2019 12:23     Admission status: Inpatient: Based on patients clinical presentation and evaluation of above  clinical data, I have made determination that patient meets Inpatient criteria at this time.  Time spent: 20 min  [facility] Pager [phone]. If 7PM-7AM, please contact night-coverage at [email or web address], Office  [phone]  password [email or web address]  01/26/2019, 3:14 PM  LOS: 2 days

## 2019-01-28 ENCOUNTER — Other Ambulatory Visit: Payer: Self-pay | Admitting: Hematology

## 2019-01-28 DIAGNOSIS — C9000 Multiple myeloma not having achieved remission: Secondary | ICD-10-CM

## 2019-01-28 DIAGNOSIS — N390 Urinary tract infection, site not specified: Secondary | ICD-10-CM

## 2019-01-29 LAB — CULTURE, BLOOD (SINGLE)
Culture: NO GROWTH
Culture: NO GROWTH
Special Requests: ADEQUATE
Special Requests: ADEQUATE

## 2019-01-29 NOTE — Progress Notes (Signed)
These preliminary result these preliminary results were noted.  Awaiting final report.

## 2019-01-29 NOTE — Discharge Summary (Signed)
Physician Discharge Summary  Edward Todd ZOX:[id number] DOB: 25-Jun-1975 DOA: 01/24/2019  PCP: London Pepper, MD  Admit date: 01/24/2019 Discharge date: 01/29/2019  Time spent: 20* minutes  Recommendations for Outpatient Follow-up:  Patient left AMA  Discharge Diagnoses:  Principal Problem:   Adjustment disorder with anxiety Active Problems:   Nausea & vomiting   Acute nonintractable headache    Filed Weights   01/24/19 1626  Weight: 94.3 kg    History of present illness:  42 y.o.male,with recent diagnosis of multiple myeloma IgG kappa, stage III,who was started on weekly Velcade injection with dexamethasone started on 09/05/2018 and oral Revlimid on 09/12/2018. Patient's treatment was recently changed to daratumumab, DEXA and Pomalyst in anticipation for bone marrow transplant. Patient says that he since he changed the treatment plan he has noticed aches and pains. Patient is on Pomalyst 4 mg 3 weeks on and one-week off. Currently he is in his off week and is supposed to start tomorrow. As per patient he is complaining of headache, had 2 episodes of vomiting today. Also had an episode of hallucination where he saw people floating in the air which lasted for about 10 minutes. He denies photophobia. No neck stiffness. Patient feels that since his chemotherapy drug was changed he is low in energy. And he is attributing all his symptoms to change of chemotherapy regimen. He denies chest pain or shortness of breat  Hospital Course:   1 Headache-patient complains of headache with mild photophobia.    He had no signs of meningismus.  CT head was negative.    2. Nausea and vomiting-improved, unclear etiology, likely medication induced.    3. Neutropenia-secondary to chemotherapy, started on Granix 40 mcg subcutaneously until absolute neutrophil count greater than 1500.  WBC was 1.4 yesterday.    Patient left AMA, did not want to wait to get further dose of Granix.  He  was told that his absolute neutrophil count is still less than 1500 and he is at risk of getting serious infections.  Patient understood the risk and left AMA.  Procedures:  None  Consultations:  Oncology  Discharge Exam: Vitals:   01/25/19 0200 01/25/19 0639  BP:  (!) 141/111  Pulse:  77  Resp:  18  Temp: 98 F (36.7 C) 98.2 F (36.8 C)  SpO2:  99%          The results of significant diagnostics from this hospitalization (including imaging, microbiology, ancillary and laboratory) are listed below for reference.    Significant Diagnostic Studies: Dg Chest 2 View  Result Date: 01/24/2019 CLINICAL DATA:  43 year old male with weakness headache and vomiting. Recent diagnosis of multiple myeloma. EXAM: CHEST - 2 VIEW COMPARISON:  Portable chest 11/30/2018 and earlier. FINDINGS: PA and lateral views of the chest. Improved lung volumes. Normal cardiac size and mediastinal contours. Visualized tracheal air column is within normal limits. Both lungs appear clear. No pneumothorax or pleural effusion. Negative visible bowel gas pattern. Multilevel thoracic compression fractures appear grossly stable from the MRI on 09/01/2018. No new osseous abnormality identified. IMPRESSION: No acute cardiopulmonary abnormality. Electronically Signed   By: Genevie Ann M.D.   On: 01/24/2019 19:04   Ct Head Wo Contrast  Result Date: 01/25/2019 CLINICAL DATA:  Headache with nausea and vomiting. Recent hallucinations EXAM: CT HEAD WITHOUT CONTRAST TECHNIQUE: Contiguous axial images were obtained from the base of the skull through the vertex without intravenous contrast. COMPARISON:  None. FINDINGS: Brain: Ventricles are normal in size and configuration. Prominence of the  cisterna magna is an anatomic variant. There is no intracranial mass, hemorrhage, extra-axial fluid collection, or midline shift. Brain parenchyma appears unremarkable. No demonstrable acute infarct. Vascular: There is no hyperdense vessel.  There is no appreciable vascular calcification. Skull: Bony calvarium appears intact. Sinuses/Orbits: Visualized paranasal sinuses are clear. Orbits appear symmetric bilaterally. Other: Mastoid air cells are clear. IMPRESSION: Study within normal limits. Electronically Signed   By: Lowella Grip III M.D.   On: 01/25/2019 12:23    Microbiology: Recent Results (from the past 240 hour(s))  Culture, Blood     Status: None   Collection Time: 01/24/19  2:58 PM   Specimen: BLOOD RIGHT HAND  Result Value Ref Range Status   Specimen Description BLOOD RIGHT HAND  Final   Special Requests   Final    BOTTLES DRAWN AEROBIC AND ANAEROBIC Blood Culture adequate volume   Culture   Final    NO GROWTH 5 DAYS Performed at [facility], [address], [place], [place] [address]    Report Status 01/29/2019 FINAL  Final  Culture, Blood     Status: None   Collection Time: 01/24/19  2:59 PM   Specimen: BLOOD  Result Value Ref Range Status   Specimen Description BLOOD BLOOD RIGHT FOREARM  Final   Special Requests   Final    BOTTLES DRAWN AEROBIC AND ANAEROBIC Blood Culture adequate volume   Culture   Final    NO GROWTH 5 DAYS Performed at [facility], [place] [address] [place], [place] [address]    Report Status 01/29/2019 FINAL  Final  Urine Culture     Status: None   Collection Time: 01/24/19  3:20 PM   Specimen: Urine, Clean Catch  Result Value Ref Range Status   Specimen Description   Final    URINE, CLEAN CATCH Performed at [facility], [place] [address] [place], [place] [address]    Special Requests   Final    NONE Performed at [facility], [place] [address] [place], [place] [address]    Culture   Final    NO GROWTH Performed at [facility], [place] [address], [place], [place] [address]    Report Status 01/25/2019 FINAL  Final  SARS CORONAVIRUS 2 (TAT 6-24 HRS) Nasopharyngeal Nasopharyngeal Swab     Status: None    Collection Time: 01/24/19  6:02 PM   Specimen: Nasopharyngeal Swab  Result Value Ref Range Status   SARS Coronavirus 2 NEGATIVE NEGATIVE Final    Comment: (NOTE) SARS-CoV-2 target nucleic acids are NOT DETECTED. The SARS-CoV-2 RNA is generally detectable in upper and lower respiratory specimens during the acute phase of infection. Negative results do not preclude SARS-CoV-2 infection, do not rule out co-infections with other pathogens, and should not be used as the sole basis for treatment or other patient management decisions. Negative results must be combined with clinical observations, patient history, and epidemiological information. The expected result is Negative. Fact Sheet for Patients: SugarRoll.be Fact Sheet for Healthcare Providers: https://www.woods-mathews.com/ This test is not yet approved or cleared by the [place] FDA and  has been authorized for detection and/or diagnosis of SARS-CoV-2 by FDA under an Emergency Use Authorization (EUA). This EUA will remain  in effect (meaning this test can be used) for the duration of the COVID-19 declaration under Section 56 4(b)(1) of the Act, 21 U.S.C. section 360bbb-3(b)(1), unless the authorization is terminated or revoked sooner. Performed at [facility], [address], [place], [place] [address]      Labs: Basic Metabolic Panel: Recent Labs  Lab 01/24/19 1233 01/25/19 0607  NA 139 139  K 3.6 3.8  CL 107 109  CO2 23 21*  GLUCOSE 117* 125*  BUN 8 7  CREATININE 0.70 0.66  CALCIUM 8.6* 8.2*  MG 1.7  --    Liver Function Tests: Recent Labs  Lab 01/24/19 1233 01/25/19 0607  AST 7* 10*  ALT 9 12  ALKPHOS 82 63  BILITOT 0.4 0.4  PROT 7.3 6.6  ALBUMIN 3.6 3.2*   No results for input(s): LIPASE, AMYLASE in the last 168 hours. No results for input(s): AMMONIA in the last 168 hours. CBC: Recent Labs  Lab 01/24/19 1233 01/25/19 0607  WBC 1.5* 1.4*   NEUTROABS 0.1*  --   HGB 10.2* 9.4*  HCT 29.2* 28.0*  MCV 88.8 92.4  PLT 184 173   Cardiac Enzymes: No results for input(s): CKTOTAL, CKMB, CKMBINDEX, TROPONINI in the last 168 hours. BNP: BNP (last 3 results) No results for input(s): BNP in the last 8760 hours.  ProBNP (last 3 results) No results for input(s): PROBNP in the last 8760 hours.  CBG: Recent Labs  Lab 01/25/19 1102 01/25/19 1550 01/25/19 2009 01/26/19 0744 01/26/19 1153  GLUCAP 145* 120* 113* 98 139*       Signed:  Oswald Hillock MD.  [facility] 01/29/2019, 6:59 PM

## 2019-01-30 ENCOUNTER — Other Ambulatory Visit: Payer: Self-pay | Admitting: Hematology

## 2019-01-30 DIAGNOSIS — C9000 Multiple myeloma not having achieved remission: Secondary | ICD-10-CM

## 2019-01-30 NOTE — Telephone Encounter (Signed)
Oral Oncology Patient Advocate Encounter  [employer] patient assistance has been approved until 04/25/19.  I called the patient and talked to him about this. He explained that he was not sure if he would be continuing Pomalyst. The patient also informed me that he has medicaid now. I checked [place] tracks and his medicaid is active. If he continues Pomalyst, the prescription can be sent to [facility] and he can continue using them.  We agreed that we would see what him and the doctor decide at his appointment on Friday and talk about next steps then.  The patient verbalized understanding and appreciation.  [facility] Phone [phone] Fax [phone] 01/30/2019   9:19 AM

## 2019-02-01 ENCOUNTER — Inpatient Hospital Stay: Payer: Medicaid Other

## 2019-02-01 ENCOUNTER — Encounter: Payer: Self-pay | Admitting: Hematology

## 2019-02-01 ENCOUNTER — Other Ambulatory Visit: Payer: Self-pay

## 2019-02-01 ENCOUNTER — Inpatient Hospital Stay: Payer: Medicaid Other | Attending: Hematology | Admitting: Hematology

## 2019-02-01 VITALS — BP 159/105 | HR 82 | Resp 17

## 2019-02-01 VITALS — BP 147/102 | HR 88 | Temp 98.3°F | Resp 18 | Ht 75.0 in | Wt 209.2 lb

## 2019-02-01 DIAGNOSIS — I10 Essential (primary) hypertension: Secondary | ICD-10-CM | POA: Diagnosis not present

## 2019-02-01 DIAGNOSIS — E119 Type 2 diabetes mellitus without complications: Secondary | ICD-10-CM | POA: Diagnosis not present

## 2019-02-01 DIAGNOSIS — D701 Agranulocytosis secondary to cancer chemotherapy: Secondary | ICD-10-CM | POA: Diagnosis not present

## 2019-02-01 DIAGNOSIS — R519 Headache, unspecified: Secondary | ICD-10-CM | POA: Insufficient documentation

## 2019-02-01 DIAGNOSIS — C9000 Multiple myeloma not having achieved remission: Secondary | ICD-10-CM

## 2019-02-01 DIAGNOSIS — T451X5A Adverse effect of antineoplastic and immunosuppressive drugs, initial encounter: Secondary | ICD-10-CM | POA: Diagnosis not present

## 2019-02-01 DIAGNOSIS — R109 Unspecified abdominal pain: Secondary | ICD-10-CM | POA: Insufficient documentation

## 2019-02-01 DIAGNOSIS — D649 Anemia, unspecified: Secondary | ICD-10-CM | POA: Diagnosis not present

## 2019-02-01 DIAGNOSIS — E1165 Type 2 diabetes mellitus with hyperglycemia: Secondary | ICD-10-CM | POA: Diagnosis not present

## 2019-02-01 DIAGNOSIS — R600 Localized edema: Secondary | ICD-10-CM | POA: Diagnosis not present

## 2019-02-01 DIAGNOSIS — R441 Visual hallucinations: Secondary | ICD-10-CM | POA: Diagnosis not present

## 2019-02-01 DIAGNOSIS — Z7982 Long term (current) use of aspirin: Secondary | ICD-10-CM | POA: Diagnosis not present

## 2019-02-01 DIAGNOSIS — M545 Low back pain: Secondary | ICD-10-CM | POA: Insufficient documentation

## 2019-02-01 DIAGNOSIS — Z9221 Personal history of antineoplastic chemotherapy: Secondary | ICD-10-CM | POA: Diagnosis not present

## 2019-02-01 DIAGNOSIS — R443 Hallucinations, unspecified: Secondary | ICD-10-CM | POA: Insufficient documentation

## 2019-02-01 DIAGNOSIS — Z79899 Other long term (current) drug therapy: Secondary | ICD-10-CM | POA: Diagnosis not present

## 2019-02-01 DIAGNOSIS — G4700 Insomnia, unspecified: Secondary | ICD-10-CM | POA: Diagnosis not present

## 2019-02-01 DIAGNOSIS — Z7984 Long term (current) use of oral hypoglycemic drugs: Secondary | ICD-10-CM | POA: Insufficient documentation

## 2019-02-01 DIAGNOSIS — Z5112 Encounter for antineoplastic immunotherapy: Secondary | ICD-10-CM | POA: Diagnosis not present

## 2019-02-01 DIAGNOSIS — Z794 Long term (current) use of insulin: Secondary | ICD-10-CM | POA: Diagnosis not present

## 2019-02-01 DIAGNOSIS — G43909 Migraine, unspecified, not intractable, without status migrainosus: Secondary | ICD-10-CM | POA: Diagnosis not present

## 2019-02-01 LAB — CBC WITH DIFFERENTIAL (CANCER CENTER ONLY)
Abs Immature Granulocytes: 0 10*3/uL (ref 0.00–0.07)
Basophils Absolute: 0 10*3/uL (ref 0.0–0.1)
Basophils Relative: 0 %
Eosinophils Absolute: 0 10*3/uL (ref 0.0–0.5)
Eosinophils Relative: 0 %
HCT: 29.4 % — ABNORMAL LOW (ref 39.0–52.0)
Hemoglobin: 9.9 g/dL — ABNORMAL LOW (ref 13.0–17.0)
Immature Granulocytes: 0 %
Lymphocytes Relative: 57 %
Lymphs Abs: 1.9 10*3/uL (ref 0.7–4.0)
MCH: 30.6 pg (ref 26.0–34.0)
MCHC: 33.7 g/dL (ref 30.0–36.0)
MCV: 90.7 fL (ref 80.0–100.0)
Monocytes Absolute: 0.3 10*3/uL (ref 0.1–1.0)
Monocytes Relative: 9 %
Neutro Abs: 1.1 10*3/uL — ABNORMAL LOW (ref 1.7–7.7)
Neutrophils Relative %: 34 %
Platelet Count: 321 10*3/uL (ref 150–400)
RBC: 3.24 MIL/uL — ABNORMAL LOW (ref 4.22–5.81)
RDW: 14.5 % (ref 11.5–15.5)
WBC Count: 3.3 10*3/uL — ABNORMAL LOW (ref 4.0–10.5)
nRBC: 0 % (ref 0.0–0.2)

## 2019-02-01 LAB — CMP (CANCER CENTER ONLY)
ALT: 9 U/L (ref 0–44)
AST: 9 U/L — ABNORMAL LOW (ref 15–41)
Albumin: 3.6 g/dL (ref 3.5–5.0)
Alkaline Phosphatase: 82 U/L (ref 38–126)
Anion gap: 12 (ref 5–15)
BUN: 6 mg/dL (ref 6–20)
CO2: 22 mmol/L (ref 22–32)
Calcium: 8.6 mg/dL — ABNORMAL LOW (ref 8.9–10.3)
Chloride: 109 mmol/L (ref 98–111)
Creatinine: 0.77 mg/dL (ref 0.61–1.24)
GFR, Est AFR Am: >60 mL/min (ref >60)
GFR, Estimated: >60 mL/min (ref >60)
Glucose, Bld: 135 mg/dL — ABNORMAL HIGH (ref 70–99)
Potassium: 3.5 mmol/L (ref 3.5–5.1)
Sodium: 143 mmol/L (ref 135–145)
Total Bilirubin: 0.3 mg/dL (ref 0.3–1.2)
Total Protein: 7.5 g/dL (ref 6.5–8.1)

## 2019-02-01 MED ORDER — CLONIDINE HCL 0.1 MG PO TABS: 0.1000 mg | ORAL_TABLET | Freq: Once | ORAL | Status: DC

## 2019-02-01 MED ORDER — MONTELUKAST SODIUM 10 MG PO TABS
10.0000 mg | ORAL_TABLET | Freq: Once | ORAL | Status: AC
  Administered 2019-02-01: 10:00:00 10 mg via ORAL

## 2019-02-01 MED ORDER — SODIUM CHLORIDE 0.9 % IV SOLN: 10.0000 mg | Freq: Once | INTRAVENOUS | Status: DC

## 2019-02-01 MED ORDER — ACETAMINOPHEN 325 MG PO TABS
650.0000 mg | ORAL_TABLET | Freq: Once | ORAL | Status: AC
  Administered 2019-02-01: 650 mg via ORAL

## 2019-02-01 MED ORDER — POMALIDOMIDE 2 MG PO CAPS: 2.0000 mg | ORAL_CAPSULE | Freq: Every day | ORAL | 0 refills | Status: DC

## 2019-02-01 MED ORDER — DIPHENHYDRAMINE HCL 25 MG PO CAPS
ORAL_CAPSULE | ORAL | Status: AC
  Filled 2019-02-01: qty 2

## 2019-02-01 MED ORDER — DEXAMETHASONE SODIUM PHOSPHATE 10 MG/ML IJ SOLN
10.0000 mg | Freq: Once | INTRAMUSCULAR | Status: AC
  Administered 2019-02-01: 10:00:00 10 mg via INTRAVENOUS

## 2019-02-01 MED ORDER — MONTELUKAST SODIUM 10 MG PO TABS
ORAL_TABLET | ORAL | Status: AC
  Filled 2019-02-01: qty 1

## 2019-02-01 MED ORDER — SODIUM CHLORIDE 0.9 % IV SOLN
16.2000 mg/kg | Freq: Once | INTRAVENOUS | Status: AC
  Administered 2019-02-01: 1600 mg via INTRAVENOUS
  Filled 2019-02-01: qty 80

## 2019-02-01 MED ORDER — SODIUM CHLORIDE 0.9 % IV SOLN: 16.0000 mg/kg | Freq: Once | INTRAVENOUS | Status: DC

## 2019-02-01 MED ORDER — DEXAMETHASONE SODIUM PHOSPHATE 10 MG/ML IJ SOLN
INTRAMUSCULAR | Status: AC
  Filled 2019-02-01: qty 1

## 2019-02-01 MED ORDER — SODIUM CHLORIDE 0.9 % IV SOLN: 16.2000 mg/kg | Freq: Once | INTRAVENOUS | Status: DC

## 2019-02-01 MED ORDER — ACETAMINOPHEN 325 MG PO TABS
ORAL_TABLET | ORAL | Status: AC
  Filled 2019-02-01: qty 2

## 2019-02-01 MED ORDER — ZOLEDRONIC ACID 4 MG/100ML IV SOLN
4.0000 mg | Freq: Once | INTRAVENOUS | Status: AC
  Administered 2019-02-01: 10:00:00 4 mg via INTRAVENOUS
  Filled 2019-02-01: qty 100

## 2019-02-01 MED ORDER — DIPHENHYDRAMINE HCL 25 MG PO CAPS
50.0000 mg | ORAL_CAPSULE | Freq: Once | ORAL | Status: AC
  Administered 2019-02-01: 10:00:00 50 mg via ORAL

## 2019-02-01 MED ORDER — SODIUM CHLORIDE 0.9 % IV SOLN
Freq: Once | INTRAVENOUS | Status: AC
  Administered 2019-02-01: 09:00:00 via INTRAVENOUS
  Filled 2019-02-01: qty 250

## 2019-02-01 NOTE — Progress Notes (Signed)
Per Dr. Burr Medico: OK to treat with ANC 1.1

## 2019-02-01 NOTE — Progress Notes (Signed)
[facility]   Telephone:([phone]) [phone] [phone]   Clinic Follow up Note   Patient Care Team: London Pepper, MD as PCP - General (Family Medicine)  Date of Service:  02/01/2019  CHIEF COMPLAINT: F/u of MM  SUMMARY OF ONCOLOGIC HISTORY: Oncology History Overview Note  Cancer Staging Multiple myeloma ([place]) Staging form: Plasma Cell Myeloma and Plasma Cell Disorders, AJCC 8th Edition - Clinical stage from 09/04/2018: RISS Stage III (Beta-2-microglobulin (mg/L): 5.5, Albumin (g/dL): 2.6, ISS: Stage III, High-risk cytogenetics: Absent, LDH: Elevated) - Signed by Truitt Merle, MD on 09/26/2018     Multiple myeloma ([place])  08/31/2018 Initial Diagnosis   Multiple myeloma ([place])   09/04/2018 Cancer Staging   Staging form: Plasma Cell Myeloma and Plasma Cell Disorders, AJCC 8th Edition - Clinical stage from 09/04/2018: RISS Stage III (Beta-2-microglobulin (mg/L): 5.5, Albumin (g/dL): 2.6, ISS: Stage III, High-risk cytogenetics: Absent, LDH: Elevated) - Signed by Truitt Merle, MD on 09/26/2018   09/04/2018 Initial Biopsy   Diagnosis 09/04/18 Bone Marrow, Aspirate,Biopsy, and Clot, left posterior iliac crest BONE MARROW: - CELLULAR MARROW WITH INVOLVEMENT BY PLASMA CELL NEOPLASM (>95%) - SEE COMMENT PERIPHERAL BLOOD: - NORMOCYTIC ANEMIA - ROULEAUX FORMATION - SEE COMPLETE BLOOD COUNT   09/05/2018 - 12/28/2018 Chemotherapy   Weekly velcade injection and dexamethasone 66m starting 09/05/18             -increase Velcade injection to twice a week (Fridays/Mondays) for 2 weeks on, 1 week off starting 10/19/18. Will return to weekly Velcade starting 11/09/18.              -Dexa stopped 12/14/18 due to hyperglycemia. Restart on 9/4 at 273mWeekly.              -Stop Velcade and change Dexa after 12/28/18.    09/14/2018 - 12/21/2018 Chemotherapy   Revlimid 254m weeks on/1 week off starting 09/14/18. Stopped 12/21/18 due to abdominal pain.     12/29/2018 -  Chemotherapy   Pomalyst 4mg66mweeks  on/ 1 week off starting 12/29/18.    01/04/2019 -  Chemotherapy   IV weekly Daratumumab and oral Dexa 8mg 14m before infusion starting 01/04/19      CURRENT THERAPY:  1. Weekly velcade injection and dexamethasone 40mg 80mting 09/05/18,Revlimid 25mgda73m weeks onand 1 week off starting 09/14/18.  -increase Velcade injection to twice a week (Fridays/Mondays) for 2 weeks on, 1 week off starting 10/19/18. Will return to weekly Velcade starting 11/09/18. -Dexa stopped 12/14/18 due to hyperglycemia. Will restart on 9/4 at 20mg we2m.  -Due to Abdominal pain, Revlimid stopped 12/21/18. -Stop Velcade and change Dexa after 12/28/18. 2. MonthlyZometastarted on 09/05/2018 3. IV weekly Daratumumab and oral Dexa 8mg day 86more infusion starting 01/04/19, Pomalyst 4mg 3 wee24mon/ 1 week off starting 12/29/18.  -Due to neutropenia, possible related hallucination Pomalyst was reduced to 2mg starti69m10/12/20 and Dara held for 1 cycle.   -changed Dara infusion to injection starting 02/08/19  INTERVAL HISTORY:  Edward Todd a follow up. He notes he did not get much sleep last night. He has been feeling very cold and so he has heater in his room. Since last hospitalization he has been feeling much better. He has been taking Acyclovir and tylenol and metformin. He otherwise has not been taking medications. He is worried about the reaction he had before. He has not taken oxycodone since his hallucinations. Pain is manageable. He denies any more hallucinations, no fever. He notes since he has  been taking Cipro. He will complete after this weekend. He is apprehensive about taking Pomalyst again but is willing to try lower dose.    REVIEW OF SYSTEMS:   Constitutional: Denies fevers, chills or abnormal weight loss Eyes: Denies blurriness of vision Ears, nose, mouth, throat, and face: Denies mucositis or sore throat Respiratory: Denies cough, dyspnea or  wheezes Cardiovascular: Denies palpitation, chest discomfort or lower extremity swelling Gastrointestinal:  Denies nausea, heartburn or change in bowel habits Skin: Denies abnormal skin rashes MSK: (+) His back pain is minimal and manageable.  Lymphatics: Denies new lymphadenopathy or easy bruising Neurological:Denies numbness, tingling or new weaknesses Behavioral/Psych: Mood is stable, no new changes  All other systems were reviewed with the patient and are negative.  MEDICAL HISTORY:  Past Medical History:  Diagnosis Date  . Cancer ([place])   . Diabetes mellitus without complication ([place])   . Hypertension     SURGICAL HISTORY: History reviewed. No pertinent surgical history.  I have reviewed the social history and family history with the patient and they are unchanged from previous note.  ALLERGIES:  is allergic to shellfish allergy.  MEDICATIONS:  Current Outpatient Medications  Medication Sig Dispense Refill  . acetaminophen (TYLENOL) 325 MG tablet Take 2 tablets (650 mg total) by mouth every 6 (six) hours as needed for mild pain or fever.    Marland Kitchen acetaminophen (TYLENOL) 500 MG tablet Take 500 mg by mouth every 6 (six) hours as needed for mild pain.    Marland Kitchen acyclovir (ZOVIRAX) 400 MG tablet TAKE 1 TABLET BY MOUTH TWICE A DAY (Patient taking differently: Take 400 mg by mouth 2 (two) times daily. ) 60 tablet 0  . aspirin 81 MG chewable tablet Chew 1 tablet (81 mg total) by mouth daily.    . calcium-vitamin D (OSCAL WITH D) 500-200 MG-UNIT tablet Take 1 tablet by mouth 2 (two) times daily. 60 tablet 1  . Continuous Blood Gluc Receiver (FREESTYLE LIBRE 14 DAY READER) DEVI See admin instructions.    . Continuous Blood Gluc Sensor (FREESTYLE LIBRE 14 DAY SENSOR) MISC APPLY EVERY 14 DAYS    . dexamethasone (DECADRON) 4 MG tablet Take 5 tablets (20 mg total) by mouth every Friday. 20 tablet 2  . HUMALOG KWIKPEN 100 UNIT/ML KwikPen Inject 0-10 Units into the skin daily as needed (blood sugar).      . Melatonin 3 MG CAPS Take 1 capsule (3 mg total) by mouth at bedtime as needed (sleep). 30 capsule 0  . metFORMIN (GLUCOPHAGE) 1000 MG tablet TAKE 1 TABLET (1,000 MG TOTAL) BY MOUTH 2 (TWO) TIMES DAILY WITH A MEAL. 180 tablet 1  . ondansetron (ZOFRAN-ODT) 4 MG disintegrating tablet Take 1 tablet (4 mg total) by mouth every 6 (six) hours as needed for nausea or vomiting. 20 tablet 0  . ONETOUCH VERIO test strip USE TO CHECK BLOOD SUGAR 3 TIMES A DAY    . oxyCODONE (OXY IR/ROXICODONE) 5 MG immediate release tablet Take 1 tablet (5 mg total) by mouth every 8 (eight) hours as needed for severe pain. (Patient taking differently: Take 5 mg by mouth 2 (two) times daily as needed for severe pain. ) 60 tablet 0  . pantoprazole (PROTONIX) 40 MG tablet TAKE 1 TABLET (40 MG TOTAL) BY MOUTH DAILY AT 6 (SIX) AM. 30 tablet 0  . polyethylene glycol (MIRALAX / GLYCOLAX) 17 g packet Take 17 g by mouth daily. (Patient taking differently: Take 17 g by mouth daily as needed for mild constipation. ) 14  each 0  . pomalidomide (POMALYST) 2 MG capsule Take 1 capsule (2 mg total) by mouth daily. [employer] Auth # [id number]     Date Obtained 01/18/2019 21 capsule 0  . potassium chloride (KLOR-CON) 20 MEQ packet Take 20 mEq by mouth daily. 30 packet 1  . senna-docusate (SENOKOT-S) 8.6-50 MG tablet Take 1 tablet by mouth 2 (two) times daily. (Patient taking differently: Take 1 tablet by mouth 2 (two) times daily as needed for mild constipation. )    . methocarbamol (ROBAXIN) 750 MG tablet Take 1 tablet (750 mg total) by mouth 3 (three) times daily as needed for muscle spasms. (Patient not taking: Reported on 01/24/2019) 90 tablet 1  . metoCLOPramide (REGLAN) 10 MG tablet Take 1 tablet (10 mg total) by mouth every 8 (eight) hours as needed for nausea. (Patient not taking: Reported on 01/24/2019) 20 tablet 0   No current facility-administered medications for this visit.    Facility-Administered Medications Ordered in Other Visits   Medication Dose Route Frequency Provider Last Rate Last Dose  . acetaminophen (TYLENOL) tablet 650 mg  650 mg Oral Once Truitt Merle, MD      . daratumumab [facility]) 1,600 mg in sodium chloride 0.9 % 420 mL chemo infusion  16.2 mg/kg (Treatment Plan Recorded) Intravenous Once Truitt Merle, MD      . dexamethasone (DECADRON) injection 10 mg  10 mg Intravenous Once Truitt Merle, MD      . diphenhydrAMINE (BENADRYL) capsule 50 mg  50 mg Oral Once Truitt Merle, MD      . montelukast (SINGULAIR) tablet 10 mg  10 mg Oral Once Truitt Merle, MD      . Zoledronic Acid (ZOMETA) IVPB 4 mg  4 mg Intravenous Once Truitt Merle, MD        PHYSICAL EXAMINATION: ECOG PERFORMANCE STATUS: 3 - Symptomatic, >50% confined to bed  Vitals:   02/01/19 0838  BP: (!) 147/102  Pulse: 88  Resp: 18  Temp: 98.3 F (36.8 C)  SpO2: 100%   Filed Weights   02/01/19 0838  Weight: 209 lb 3.2 oz (94.9 kg)    GENERAL:alert, no distress and comfortable SKIN: skin color, texture, turgor are normal, no rashes or significant lesions EYES: normal, Conjunctiva are pink and non-injected, sclera clear  NECK: supple, thyroid normal size, non-tender, without nodularity LYMPH:  no palpable lymphadenopathy in the cervical, axillary  LUNGS: clear to auscultation and percussion with normal breathing effort HEART: regular rate & rhythm and no murmurs and no lower extremity edema ABDOMEN:abdomen soft, non-tender and normal bowel sounds Musculoskeletal:no cyanosis of digits and no clubbing  NEURO: alert & oriented x 3 with fluent speech, no focal motor/sensory deficits  LABORATORY DATA:  I have reviewed the data as listed CBC Latest Ref Rng & Units 02/01/2019 01/25/2019 01/24/2019  WBC 4.0 - 10.5 K/uL 3.3(L) 1.4(LL) 1.5(L)  Hemoglobin 13.0 - 17.0 g/dL 9.9(L) 9.4(L) 10.2(L)  Hematocrit 39.0 - 52.0 % 29.4(L) 28.0(L) 29.2(L)  Platelets 150 - 400 K/uL 321 173 184     CMP Latest Ref Rng & Units 02/01/2019 01/25/2019 01/24/2019  Glucose 70 - 99 mg/dL  135(H) 125(H) 117(H)  BUN 6 - 20 mg/dL '6 7 8  ' Creatinine 0.61 - 1.24 mg/dL 0.77 0.66 0.70  Sodium 135 - 145 mmol/L 143 139 139  Potassium 3.5 - 5.1 mmol/L 3.5 3.8 3.6  Chloride 98 - 111 mmol/L 109 109 107  CO2 22 - 32 mmol/L 22 21(L) 23  Calcium 8.9 - 10.3 mg/dL 8.6(L)  8.2(L) 8.6(L)  Total Protein 6.5 - 8.1 g/dL 7.5 6.6 7.3  Total Bilirubin 0.3 - 1.2 mg/dL 0.3 0.4 0.4  Alkaline Phos 38 - 126 U/L 82 63 82  AST 15 - 41 U/L 9(L) 10(L) 7(L)  ALT 0 - 44 U/L '9 12 9      ' RADIOGRAPHIC STUDIES: I have personally reviewed the radiological images as listed and agreed with the findings in the report. No results found.   ASSESSMENT & PLAN:  Edward Todd is a 43 y.o. male with   1. 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 Work upconfirmed multiple myeloma, unfortunately he has multiple compression fracture of thoracic and lumbar spine. -I started him oninduction chemo with TML:YYTKPT Velcade injections with Dexamethasonestartedon 09/05/18 and oral Revlimid on 09/14/18.He could not tolerated Velcade injection twice a week -His cytogeneticsand FISHresults showed trisomy 11, FISH panel otherwise negative, this is considered a standard risk.  -He has been on first line chemo VRD, due to poor tolerance to Revlimid, I stopped it in August 2020. -He met with Dr. Norma Fredrickson who plans to move forward with bone marrow transplant after about 2 months of Dara/Pomalyst. He recommended change his treatment toDaratumumab, Dexa and Pomalyst.  -I recommended port placement, he declined  -He has completed one cycle of Pomalyst, and 2 doses of Dara infusion which he tolerated well  -He had recent severe neutropenia, hallucinations and headaches with N&V which required hospitalization for management.  -I discussed these hallucinations and confusion could be secondary to oxycodone, Dexa and he feels this is related to  Pomalyst. I discussed this with Dr. Norma Fredrickson and we recommend he try Pomalyst again but at half dose 73m to see if tolerable. Will also hold oral Dexa for this week, and I will reduce premed dexa from 231mto 1064moday. He is apprehensive about restarting Pomalyst, but agreed to try. If not tolerable we can switch Pomalyst back to Velcade.  -Labs reviewed, CBC and CMP WNL except WBC 3.3, Hg 9.9, ANC 1.1, calcium 8.6. Overall adequate to proceed with Dara today.  -Will start 2mg45mmalyst 02/04/19. Continue Zometa every 4 weeks. He will continue Acyclovir.  -We discussed Dara injection versus infusion. He is interested, will see if his insurance approves it. Plan to start next week.  -F/u in next week.  2. Anemia, secondary to MM -He required blood transfusion 09/12/18. -On Oral iron -Labs reviewed, Hg at9.9today (10/9//20), overall much improved since he started MM treatment -Continue to monitor.   3. Severe low back pain, Headaches  -secondary to thoracic and lumbar compression fractures,lumbar spine stenosisseen on 08/31/18 bone survey.  -He has completed rehab and currently doing PT. -He is on Robaxin 750mg5m oxycodone 10mg372mimes a dayas needed which is mostly controlling his pain.  -He will continuestool softeners as needed to prevent constipation from pain meds.  -His peripheral strength and ambulationcontinues to slowlyimprove.He will continue PT. He is now able to ambulate for 30 minutes at a time. -His back pain is occasional, but mostly gone. He notes he continues to reduce his oxycodone10mg a13montinues to reduce until weaned off. -Hewill continue tof/uwithorthopedicsDr. Yates. Lorin Mercyh recent HA he was encouraged to take his oxycodone when needed but he is fearful of taking this due to recent hallucinations. He is agreeable to taking Tylenol and have adjusted the dose to 1000 mg every 6 hours as needed.  4. DM, Type II, hyperglycemia -On  Metformin BID  -I againencouraged him to reduce carbohydrates, juice, soft drinks in diet. -He is being seen by Dr. Loanne Drilling. He has sugar monitor placed on right arm. He is currently on no carb diet and will continue metformin.  -I encouraged him to watch his weight. He can increase protein and calorie to avoid losing too much weight. He understands. -I will hold Dexaon 12/14/18 and restarted on 9/4. -BG at135 today (02/01/19).overall much improved.  -He will continue calcium BID and Vitamin D 1000 units.   5. Financial support -I offered him [facility] that are available to him. -He doesnot haveinsurance and hehas applied fordisability.I gave him letter about his disability for 1 year. -I recommend he see advocate Annamary Rummage and billing department about help with his medical bills. -He was recently approved for Medicaid.   6. Stress, Hallucinations, headaches and nausea  -During recent hospital stay this improved. He still has occasional hallucination, and his cognitive function maybe slightly impaired, but he is oriented X3. Now resolved.  -He feels this could be related to his treatment.  -He wants to continue to hold oxycodone. Will restart Pomalyst at 5m on 02/04/19. Will hold Dexa for 1 cycle. Will try to narrow down possible cause. He is agreeable.  -will check CMV antibodies and viral load to rule out active CMV infection    PLAN: -Labs reviewed and adequate to proceed with Dara today and weekly, will change to Faspro injection from next week  -hold oral dexa, and change iv dexa from 286mto 1057mefore Dara  -Zometa today and continueZometaevery 4 weeks   -start Pomalyst 2mg49mweeks on/1 week off on 02/04/19, [facility]  -F/u next week     No problem-specific Assessment & Plan notes found for this encounter.   No orders of the defined types were placed in this encounter.  All questions were answered. The patient knows to call the clinic  with any problems, questions or concerns. No barriers to learning was detected. I spent 20 minutes counseling the patient face to face. The total time spent in the appointment was 25 minutes and more than 50% was on counseling and review of test results     Mickey Hebel Truitt Merle 02/01/2019   I, AmoyJoslyn Devon acting as scribe for Ashara Lounsbury Truitt Merle.   I have reviewed the above documentation for accuracy and completeness, and I agree with the above.

## 2019-02-01 NOTE — Telephone Encounter (Signed)
Oral Oncology Patient Advocate Encounter  The patient will continue Pomlayst.  [employer] is not contracted with Medicaid. The pomalyst prescription was sent to [facility].  I followed up with [facility] and they are processing the prescription, they will be calling the patient on Monday to schedule shipment.  I called the patient and left a detailed voicemail.  [place] Patient [place] Phone [phone] Fax [phone] 02/01/2019   3:22 PM

## 2019-02-01 NOTE — Patient Instructions (Signed)
[facility] Discharge Instructions for Patients Receiving Chemotherapy  Today you received the following chemotherapy agents Daratumamab  To help prevent nausea and vomiting after your treatment, we encourage you to take your nausea medication as prescribed by MD.   If you develop nausea and vomiting that is not controlled by your nausea medication, call the clinic.   BELOW ARE SYMPTOMS THAT SHOULD BE REPORTED IMMEDIATELY:  *FEVER GREATER THAN 100.5 F  *CHILLS WITH OR WITHOUT FEVER  NAUSEA AND VOMITING THAT IS NOT CONTROLLED WITH YOUR NAUSEA MEDICATION  *UNUSUAL SHORTNESS OF BREATH  *UNUSUAL BRUISING OR BLEEDING  TENDERNESS IN MOUTH AND THROAT WITH OR WITHOUT PRESENCE OF ULCERS  *URINARY PROBLEMS  *BOWEL PROBLEMS  UNUSUAL RASH Items with * indicate a potential emergency and should be followed up as soon as possible.  Feel free to call the clinic should you have any questions or concerns. The clinic phone number is [phone].  Please show the CHEMO ALERT CARD at check-in to the Emergency Department and triage nurse.   

## 2019-02-01 NOTE — Progress Notes (Signed)
Pt discharge BP elevated; see flowsheets. Pt denies any headaches or vision changes. Pt denies taking any BP medications. Reviewed pt discharge BP with V. Tanner, PA and pt to receive clonidine PO prior to discharge. Educated pt on plan of care and pt refused medication stating his ride was here and he needed to go. Reviewed BP and pt concerns with Dr. Burr Medico. Dr Burr Medico aware and had no new orders. Pt to keep detailed log of BP measures at home and bring to clinic with next appointment. Pt educated on signs and symptoms of hypertension and verbalized understanding. Pt aware to call clinic with any questions or concerns. Pt aware of plan and had no further questions.

## 2019-02-03 ENCOUNTER — Other Ambulatory Visit: Payer: Self-pay | Admitting: Hematology

## 2019-02-03 DIAGNOSIS — C9000 Multiple myeloma not having achieved remission: Secondary | ICD-10-CM

## 2019-02-04 ENCOUNTER — Telehealth: Payer: Self-pay

## 2019-02-04 ENCOUNTER — Telehealth: Payer: Self-pay | Admitting: Hematology

## 2019-02-04 ENCOUNTER — Encounter: Payer: Self-pay | Admitting: *Deleted

## 2019-02-04 NOTE — Telephone Encounter (Signed)
Patient left message that he is having side effects related to a medication.  He did not leave what medication or what side effects.  Called patient back left voice message asking for this information.

## 2019-02-04 NOTE — Telephone Encounter (Signed)
Yes, he can use Tylenol or Ibuprofen as needed for pain. Please check if he has started Pomalyst this week, thanks   Truitt Merle MD

## 2019-02-04 NOTE — Telephone Encounter (Signed)
Scheduled appt per 10/9 los.  Spoke with pt and he is aware of his scheduled appt dates and time.  Sent a staff message to get his treatment added for 10/15

## 2019-02-04 NOTE — Telephone Encounter (Signed)
Patient calls stating he wants to know if he can take any Ibuprofen. He is trying to stop taking the Oxycodone for his pain.  Since not taking the Oxycodone as often he is experiencing foot pain.  I attempted to call this patient back three times and received his voice message each time.

## 2019-02-04 NOTE — Telephone Encounter (Signed)
[facility] called requesting his current insurance information.  Faxed information on [place] Medicaid Member ID [id number] L to fax  [phone], notified them do not have a copy of his card on file.

## 2019-02-04 NOTE — Progress Notes (Signed)
[place] [employer]  Holiday representative contacted patient at home to follow up on referral for advance directives and counseling.  Patient stated he was interested in completing advance directives.  CSW team member plans to see patient this week when he is at [facility] to review advance directive packet.   CSW and patient also dicussed emotional support, and explored counseling options.  CSW and patient discussed common feelings and emotions when going through treatment, and the importance of support.  Patient was agreeable to a referral to the [facility] counseling intern.  CSW provided contact information and will follow up with patient at his next appointment.    Johnnye Lana, MSW, LCSW, OSW-C Clinical Social Worker [facility] [phone]

## 2019-02-05 ENCOUNTER — Telehealth: Payer: Self-pay | Admitting: Hematology

## 2019-02-05 ENCOUNTER — Telehealth: Payer: Self-pay

## 2019-02-05 ENCOUNTER — Encounter: Payer: Self-pay | Admitting: General Practice

## 2019-02-05 NOTE — Telephone Encounter (Signed)
Oral Oncology Patient Advocate Encounter  I confirmed with the patient that the Pomalyst has shipped to deliver 10/14.   He asked when he was supposed to start taking it and I transferred him to Dr. Lewayne Bunting nurse.  [name] Phone [phone] Fax [phone] 02/05/2019   11:25 AM

## 2019-02-05 NOTE — Telephone Encounter (Signed)
Called patient to inform patient that has treatment was added and that the MD will see him in infusion per a 10/13 staf message   Left a voice message of the appt date and time

## 2019-02-05 NOTE — Telephone Encounter (Signed)
He can start today.   Edward Merle MD

## 2019-02-05 NOTE — Progress Notes (Signed)
[facility] Spiritual Care Note  Left general message re pt's question about help completing Advance Directives, encouraging callback to direct dial number.   [place], [place], [facility] Pager [phone] Voicemail [phone]

## 2019-02-05 NOTE — Telephone Encounter (Signed)
Patient calls stating he will get his Pomalyst from [facility] on Wednesday 10/14, instructed him to begin taking it when he receives it.  Also per Dr. Burr Medico he can use Tylenol and/or Ibuprofen for his pain.  He has an appointment to follow up on Thursday with Dr. Burr Medico.  The patient verbalized an understanding.

## 2019-02-06 NOTE — Progress Notes (Signed)
[place]   Telephone:([phone] Fax:([phone]   Clinic Follow up Note   Patient Care Team: London Pepper, MD as PCP - General (Family Medicine)  Date of Service:  02/08/2019  CHIEF COMPLAINT: F/u of MM  SUMMARY OF ONCOLOGIC HISTORY: Oncology History Overview Note  Cancer Staging Multiple myeloma ([place]) Staging form: Plasma Cell Myeloma and Plasma Cell Disorders, AJCC 8th Edition - Clinical stage from 09/04/2018: RISS Stage III (Beta-2-microglobulin (mg/L): 5.5, Albumin (g/dL): 2.6, ISS: Stage III, High-risk cytogenetics: Absent, LDH: Elevated) - Signed by Truitt Merle, MD on 09/26/2018     Multiple myeloma ([place])  08/31/2018 Initial Diagnosis   Multiple myeloma ([place])   09/04/2018 Cancer Staging   Staging form: Plasma Cell Myeloma and Plasma Cell Disorders, AJCC 8th Edition - Clinical stage from 09/04/2018: RISS Stage III (Beta-2-microglobulin (mg/L): 5.5, Albumin (g/dL): 2.6, ISS: Stage III, High-risk cytogenetics: Absent, LDH: Elevated) - Signed by Truitt Merle, MD on 09/26/2018   09/04/2018 Initial Biopsy   Diagnosis 09/04/18 Bone Marrow, Aspirate,Biopsy, and Clot, left posterior iliac crest BONE MARROW: - CELLULAR MARROW WITH INVOLVEMENT BY PLASMA CELL NEOPLASM (>95%) - SEE COMMENT PERIPHERAL BLOOD: - NORMOCYTIC ANEMIA - ROULEAUX FORMATION - SEE COMPLETE BLOOD COUNT   09/05/2018 - 12/28/2018 Chemotherapy   Weekly velcade injection and dexamethasone 91m starting 09/05/18             -increase Velcade injection to twice a week (Fridays/Mondays) for 2 weeks on, 1 week off starting 10/19/18. Will return to weekly Velcade starting 11/09/18.              -Dexa stopped 12/14/18 due to hyperglycemia. Restart on 9/4 at 224mWeekly.              -Stop Velcade and change Dexa after 12/28/18.    09/14/2018 - 12/21/2018 Chemotherapy   Revlimid 2540m weeks on/1 week off starting 09/14/18. Stopped 12/21/18 due to abdominal pain.     12/29/2018 -  Chemotherapy   Pomalyst 4mg64mweeks  on/ 1 week off starting 12/29/18.   -Due to neutropenia, possible related hallucination Pomalyst was reduced to 61mg 23mrting 02/06/19   01/04/2019 -  Chemotherapy   IV weekly Daratumumab and oral Dexa 8mg d19mbefore infusion starting 01/04/19             -changed Dara infusion to Faspro injection starting 02/08/19      CURRENT THERAPY:  1. Weekly velcade injection and dexamethasone 40mg s80ming 09/05/18,Revlimid 25mgdai4mweeks onand 1 week off starting 09/14/18.  -increase Velcade injection to twice a week (Fridays/Mondays) for 2 weeks on, 1 week off starting 10/19/18. Will return to weekly Velcade starting 11/09/18. -Dexa stopped 12/14/18 due to hyperglycemia. Will restart on 9/4 at 20mg wee41m  -Due to Abdominal pain, Revlimid stopped 12/21/18. -Stop Velcade and change Dexa after 12/28/18. 2. MonthlyZometastarted on 09/05/2018 3. IV weekly Daratumumab and oral Dexa 8mg day b61mre infusion starting 01/04/19, Pomalyst 4mg 3 week68mn/ 1 week off starting 12/29/18.             -Due to neutropenia, possible related hallucination Pomalyst was reduced to 61mg startin39m0/14/20 and Dara held for 1 cycle.              -changed Dara infusion to Faspro injection starting 02/08/19  INTERVAL HISTORY:  Edward Todd a follow up and treatment. He presents to the clinic alone. He denies having HA or hallucination this past week. This past week he was on 61m81m  Pomalyst, not on pain meds and not on dexa. He feels eating and appetite is adequate. He notes occasional LE edema, mostly at night. He notes he has been pushing himself to walk and exercise more and feels this may be related. He feels he is up at least 50% of the day. He ambulates with cane now. He notes no more urination issues after completing Cipro.  He started 79m Pomalyst on 02/06/19.     REVIEW OF SYSTEMS:   Constitutional: Denies fevers, chills or abnormal weight loss Eyes: Denies  blurriness of vision Ears, nose, mouth, throat, and face: Denies mucositis or sore throat Respiratory: Denies cough, dyspnea or wheezes Cardiovascular: Denies palpitation, chest discomfort (+) Occasional lower extremity swelling Gastrointestinal:  Denies nausea, heartburn or change in bowel habits Skin: Denies abnormal skin rashes Lymphatics: Denies new lymphadenopathy or easy bruising Neurological:Denies numbness, tingling or new weaknesses (+) strength is improving, he ambulates with cane now  Behavioral/Psych: Mood is stable, no new changes  All other systems were reviewed with the patient and are negative.  MEDICAL HISTORY:  Past Medical History:  Diagnosis Date   Cancer ([facility]    Diabetes mellitus without complication ([facility]    Hypertension     SURGICAL HISTORY: History reviewed. No pertinent surgical history.  I have reviewed the social history and family history with the patient and they are unchanged from previous note.  ALLERGIES:  is allergic to shellfish allergy.  MEDICATIONS:  Current Outpatient Medications  Medication Sig Dispense Refill   acetaminophen (TYLENOL) 325 MG tablet Take 2 tablets (650 mg total) by mouth every 6 (six) hours as needed for mild pain or fever.     acetaminophen (TYLENOL) 500 MG tablet Take 500 mg by mouth every 6 (six) hours as needed for mild pain.     acyclovir (ZOVIRAX) 400 MG tablet TAKE 1 TABLET BY MOUTH TWICE A DAY (Patient taking differently: Take 400 mg by mouth 2 (two) times daily. ) 60 tablet 0   aspirin 81 MG chewable tablet Chew 1 tablet (81 mg total) by mouth daily.     calcium-vitamin D (OSCAL WITH D) 500-200 MG-UNIT tablet Take 1 tablet by mouth 2 (two) times daily. 60 tablet 1   Continuous Blood Gluc Receiver (FREESTYLE LIBRE 14 DAY READER) DEVI See admin instructions.     Continuous Blood Gluc Sensor (FREESTYLE LIBRE 14 DAY SENSOR) MISC APPLY EVERY 14 DAYS     dexamethasone (DECADRON) 4 MG tablet Take 5 tablets (20 mg  total) by mouth every Friday. 20 tablet 2   HUMALOG KWIKPEN 100 UNIT/ML KwikPen Inject 0-10 Units into the skin daily as needed (blood sugar).      Melatonin 3 MG CAPS Take 1 capsule (3 mg total) by mouth at bedtime as needed (sleep). 30 capsule 0   metFORMIN (GLUCOPHAGE) 1000 MG tablet TAKE 1 TABLET (1,000 MG TOTAL) BY MOUTH 2 (TWO) TIMES DAILY WITH A MEAL. 180 tablet 1   methocarbamol (ROBAXIN) 750 MG tablet Take 1 tablet (750 mg total) by mouth 3 (three) times daily as needed for muscle spasms. (Patient not taking: Reported on 01/24/2019) 90 tablet 1   metoCLOPramide (REGLAN) 10 MG tablet Take 1 tablet (10 mg total) by mouth every 8 (eight) hours as needed for nausea. (Patient not taking: Reported on 01/24/2019) 20 tablet 0   ondansetron (ZOFRAN-ODT) 4 MG disintegrating tablet Take 1 tablet (4 mg total) by mouth every 6 (six) hours as needed for nausea or vomiting. 20 tablet 0  ONETOUCH VERIO test strip USE TO CHECK BLOOD SUGAR 3 TIMES A DAY     oxyCODONE (OXY IR/ROXICODONE) 5 MG immediate release tablet Take 1 tablet (5 mg total) by mouth every 8 (eight) hours as needed for severe pain. (Patient taking differently: Take 5 mg by mouth 2 (two) times daily as needed for severe pain. ) 60 tablet 0   pantoprazole (PROTONIX) 40 MG tablet TAKE 1 TABLET (40 MG TOTAL) BY MOUTH DAILY AT 6 (SIX) AM. 90 tablet 1   polyethylene glycol (MIRALAX / GLYCOLAX) 17 g packet Take 17 g by mouth daily. (Patient taking differently: Take 17 g by mouth daily as needed for mild constipation. ) 14 each 0   pomalidomide (POMALYST) 2 MG capsule Take 1 capsule (2 mg total) by mouth daily. [employer] Auth # [id number]     Date Obtained 01/18/2019 21 capsule 0   potassium chloride (KLOR-CON) 20 MEQ packet Take 20 mEq by mouth daily. 30 packet 1   senna-docusate (SENOKOT-S) 8.6-50 MG tablet Take 1 tablet by mouth 2 (two) times daily. (Patient taking differently: Take 1 tablet by mouth 2 (two) times daily as needed for mild  constipation. )     No current facility-administered medications for this visit.     PHYSICAL EXAMINATION: ECOG PERFORMANCE STATUS: 2 - Symptomatic, <50% confined to bed BP 134/99, HR 90, RR 17, T 36.7 GENERAL:alert, no distress and comfortable SKIN: skin color, texture, turgor are normal, no rashes or significant lesions EYES: normal, Conjunctiva are pink and non-injected, sclera clear  NECK: supple, thyroid normal size, non-tender, without nodularity LYMPH:  no palpable lymphadenopathy in the cervical, axillary  LUNGS: clear to auscultation and percussion with normal breathing effort HEART: regular rate & rhythm and no murmurs and no lower extremity edema ABDOMEN:abdomen soft, non-tender and normal bowel sounds Musculoskeletal:no cyanosis of digits and no clubbing  NEURO: alert & oriented x 3 with fluent speech, no focal motor/sensory deficits  LABORATORY DATA:  I have reviewed the data as listed CBC Latest Ref Rng & Units 02/08/2019 02/01/2019 01/25/2019  WBC 4.0 - 10.5 K/uL 4.6 3.3(L) 1.4(LL)  Hemoglobin 13.0 - 17.0 g/dL 9.8(L) 9.9(L) 9.4(L)  Hematocrit 39.0 - 52.0 % 28.0(L) 29.4(L) 28.0(L)  Platelets 150 - 400 K/uL 402(H) 321 173     CMP Latest Ref Rng & Units 02/08/2019 02/01/2019 01/25/2019  Glucose 70 - 99 mg/dL 145(H) 135(H) 125(H)  BUN 6 - 20 mg/dL _0 Creatinine 0.61 - 1.24 mg/dL 0.79 0.77 0.66  Sodium 135 - 145 mmol/L 138 143 139  Potassium 3.5 - 5.1 mmol/L 3.7 3.5 3.8  Chloride 98 - 111 mmol/L 106 109 109  CO2 22 - 32 mmol/L 21(L) 22 21(L)  Calcium 8.9 - 10.3 mg/dL 8.7(L) 8.6(L) 8.2(L)  Total Protein 6.5 - 8.1 g/dL 7.6 7.5 6.6  Total Bilirubin 0.3 - 1.2 mg/dL 0.5 0.3 0.4  Alkaline Phos 38 - 126 U/L 66 82 63  AST 15 - 41 U/L 8(L) 9(L) 10(L)  ALT 0 - 44 U/L _1 RADIOGRAPHIC STUDIES: I have personally reviewed the radiological images as listed and agreed with the findings in the report. No results found.   ASSESSMENT & PLAN:  Edward Todd is  a 43 y.o. male with   1. 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 Work upconfirmed multiple myeloma, unfortunately he has multiple compression fracture of thoracic and lumbar  spine. -I started him oninduction chemo with DTO:IZTIWP Velcade injections with Dexamethasonestartedon 09/05/18 and oral Revlimid on 09/14/18.He could not tolerated Velcade injection twice a week -His cytogeneticsand FISHresults showed trisomy 11, FISH panel otherwise negative, this is considered a standard risk.  -He was on first line chemo VRD, due to poor tolerance to Revlimid, I stopped it in August 2020. -He met with Dr. Norma Fredrickson who plans to move forward with bone marrow transplant after about 2 months of Dara/Pomalyst. He recommended change his treatment toDaratumumab, Dexa and Pomalyst. -Irecommended port placement, he declined -He has completed one cycle of Pomalyst, and 2 doses of Dara infusion, but developed severe neutropenia, hallucinations and headaches with N&V which required hospitalization for management.  -I discussed these hallucinations and confusion could be secondary to oxycodone or Dexa and he feels this is related to Pomalyst. I discussed this with Dr. Norma Fredrickson. We held oral Dexa for 1 week, reduced premed dexa from 66m to 159mon 02/01/19 and reduced Pomalyst to 41m64m  -He restarted Pomalyst at 41mg50m 02/06/19. He has tolerated well with no issues so far.  -Labs reviewed, CBC and CMP WNL except Hg 9.8, PLT 402K, calcium 8.7. CMV panel still pending.  Overall adequate to proceed with first Dara injection today.  -Continue 41mg 6malyst on for 3 weeks, off 1 week, Dara weekly. Continue Zometa every 4 weeks. He will continue Acyclovir.  -He restarted oral dexa 8mg o47m0/15/20, 8mg to55m and 10mg in37mmeds. Will continue current dexa dose  -F/u in next week.   2. Anemia, secondary to MM -He required  blood transfusion 09/12/18. -On Oral iron -Labs reviewed, Hg at9.8today (10/16//20), overall much improved since he started MM treatment -Continue to monitor.   3. Severe low back pain, Headaches  -secondary to thoracic and lumbar compression fractures,lumbar spine stenosisseen on 08/31/18 bone survey.  -He has completed rehab and currently doing PT. -He is on Robaxin 750mg and37mcodone 10mg3-4 t28m a dayas needed which is mostly controlling his pain.  -He will continuestool softeners as needed to prevent constipation from pain meds.  -His peripheral strength and ambulationcontinues to slowlyimprove.He will continue PT. He is now able to ambulate for 30 minutes at a time. -His back pain is occasional, but mostly gone. He notes he continues to reduce his oxycodone10mgand co50mues to reduce until weaned off. -Hewill continue tof/uwithorthopedicsDr. Yates.  -[facility] he wasencouraged to take his oxycodone when needed but he is fearful of taking this due to recent hallucinations. He is agreeable to taking Tylenol and have adjusted the dose to 1000 mg every 6 hours as needed.  -Pain is overall controlled.   4. DM, Type II, hyperglycemia -On Metformin BID -I againencouraged him to reduce carbohydrates, juice, soft drinks in diet. -He is being seen by Dr. Ellison. HeLoanne Drillinggar monitor placed on right arm. He is currently on no carb diet and will continue metformin.  -I encouraged him to watch his weight. He can increase protein and calorie to avoid losing too much weight. He understands. -I will hold Dexaon 12/14/18 and restarted on 9/4. -BG at145 today (02/08/19).overall much improved.  -He will continue calcium BID and Vitamin D 1000 units.  5. Financial support -I offered [facility] that are available to him. -He doesnot haveinsurance and hehas applied fordisability.I gave him letter about his disability for 1 year. -I recommend he  see advocate Lenise WhitAnnamary Rummageg department about help with his medical bills. -He was recently approved  for Medicaid.   6. Stress, Hallucinations, headaches and nausea  -During recent hospital stay this improved. He still has occasional hallucination,and his cognitive functionmaybe slightly impaired, but he is oriented X3. Now resolved.  -He feels this could be related to his treatment.  -He wants to continue to hold oxycodone. Will restart Pomalyst at 66m on 02/04/19. Will hold Dexa for 1 cycle. Will try to narrow down possible cause. He is agreeable.  -will check CMV antibodies and viral load to rule out active CMV infection  -Has not recurred with Pomalyst decrease, and holding Dexa and oxycodone. Will continue to monitor.    PLAN: -Labs reviewed and adequate to proceed with Dara injection today and weekly -ContinueZometaevery 4 weeks  -Continue Pomalyst 286m3 weeks on/1 week off, current cycle 2 started 02/06/19  -pt will take dexa 42m542mhe day before and day of Dara, and additional 38m42m premeds  -F/u next week    No problem-specific Assessment & Plan notes found for this encounter.   No orders of the defined types were placed in this encounter.  All questions were answered. The patient knows to call the clinic with any problems, questions or concerns. No barriers to learning was detected. I spent 20 minutes counseling the patient face to face. The total time spent in the appointment was 25 minutes and more than 50% was on counseling and review of test results     Mardi Cannady Truitt Merle 02/08/2019   I, AmoyJoslyn Devon acting as scribe for Christino Mcglinchey Truitt Merle.   I have reviewed the above documentation for accuracy and completeness, and I agree with the above.

## 2019-02-06 NOTE — Progress Notes (Signed)
Left VM for pt regarding a referral for counseling services from [facility].  Directed pt to call about discussing counseling services. Will call pt again next week if no return call is received.    Art Buff McConnell Counseling Intern Voicemail:  [phone]

## 2019-02-07 ENCOUNTER — Other Ambulatory Visit: Payer: Medicaid Other

## 2019-02-07 ENCOUNTER — Ambulatory Visit: Payer: Medicaid Other | Admitting: Hematology

## 2019-02-08 ENCOUNTER — Inpatient Hospital Stay: Payer: Medicaid Other

## 2019-02-08 ENCOUNTER — Inpatient Hospital Stay (HOSPITAL_BASED_OUTPATIENT_CLINIC_OR_DEPARTMENT_OTHER): Payer: Medicaid Other | Admitting: Hematology

## 2019-02-08 ENCOUNTER — Inpatient Hospital Stay: Payer: Medicaid Other | Admitting: Medical

## 2019-02-08 ENCOUNTER — Inpatient Hospital Stay: Payer: Medicaid Other | Admitting: General Practice

## 2019-02-08 ENCOUNTER — Other Ambulatory Visit: Payer: Self-pay

## 2019-02-08 ENCOUNTER — Encounter: Payer: Self-pay | Admitting: Hematology

## 2019-02-08 ENCOUNTER — Encounter: Payer: Self-pay | Admitting: General Practice

## 2019-02-08 ENCOUNTER — Other Ambulatory Visit: Payer: 59

## 2019-02-08 ENCOUNTER — Ambulatory Visit: Payer: Medicaid Other

## 2019-02-08 VITALS — BP 134/99 | HR 90 | Temp 98.0°F | Resp 17

## 2019-02-08 DIAGNOSIS — C9000 Multiple myeloma not having achieved remission: Secondary | ICD-10-CM

## 2019-02-08 DIAGNOSIS — E119 Type 2 diabetes mellitus without complications: Secondary | ICD-10-CM | POA: Diagnosis not present

## 2019-02-08 DIAGNOSIS — Z5112 Encounter for antineoplastic immunotherapy: Secondary | ICD-10-CM | POA: Diagnosis not present

## 2019-02-08 DIAGNOSIS — I10 Essential (primary) hypertension: Secondary | ICD-10-CM | POA: Diagnosis not present

## 2019-02-08 LAB — CMP (CANCER CENTER ONLY)
ALT: 7 U/L (ref 0–44)
AST: 8 U/L — ABNORMAL LOW (ref 15–41)
Albumin: 3.7 g/dL (ref 3.5–5.0)
Alkaline Phosphatase: 66 U/L (ref 38–126)
Anion gap: 11 (ref 5–15)
BUN: 11 mg/dL (ref 6–20)
CO2: 21 mmol/L — ABNORMAL LOW (ref 22–32)
Calcium: 8.7 mg/dL — ABNORMAL LOW (ref 8.9–10.3)
Chloride: 106 mmol/L (ref 98–111)
Creatinine: 0.79 mg/dL (ref 0.61–1.24)
GFR, Est AFR Am: >60 mL/min (ref >60)
GFR, Estimated: >60 mL/min (ref >60)
Glucose, Bld: 145 mg/dL — ABNORMAL HIGH (ref 70–99)
Potassium: 3.7 mmol/L (ref 3.5–5.1)
Sodium: 138 mmol/L (ref 135–145)
Total Bilirubin: 0.5 mg/dL (ref 0.3–1.2)
Total Protein: 7.6 g/dL (ref 6.5–8.1)

## 2019-02-08 LAB — CBC WITH DIFFERENTIAL (CANCER CENTER ONLY)
Abs Immature Granulocytes: 0 10*3/uL (ref 0.00–0.07)
Basophils Absolute: 0 10*3/uL (ref 0.0–0.1)
Basophils Relative: 0 %
Eosinophils Absolute: 0 10*3/uL (ref 0.0–0.5)
Eosinophils Relative: 0 %
HCT: 28 % — ABNORMAL LOW (ref 39.0–52.0)
Hemoglobin: 9.8 g/dL — ABNORMAL LOW (ref 13.0–17.0)
Immature Granulocytes: 0 %
Lymphocytes Relative: 36 %
Lymphs Abs: 1.6 10*3/uL (ref 0.7–4.0)
MCH: 30.8 pg (ref 26.0–34.0)
MCHC: 35 g/dL (ref 30.0–36.0)
MCV: 88.1 fL (ref 80.0–100.0)
Monocytes Absolute: 0.4 10*3/uL (ref 0.1–1.0)
Monocytes Relative: 8 %
Neutro Abs: 2.6 10*3/uL (ref 1.7–7.7)
Neutrophils Relative %: 56 %
Platelet Count: 402 10*3/uL — ABNORMAL HIGH (ref 150–400)
RBC: 3.18 MIL/uL — ABNORMAL LOW (ref 4.22–5.81)
RDW: 15.4 % (ref 11.5–15.5)
WBC Count: 4.6 10*3/uL (ref 4.0–10.5)
nRBC: 0 % (ref 0.0–0.2)

## 2019-02-08 MED ORDER — ACETAMINOPHEN 325 MG PO TABS
ORAL_TABLET | ORAL | Status: AC
  Filled 2019-02-08: qty 2

## 2019-02-08 MED ORDER — DIPHENHYDRAMINE HCL 25 MG PO CAPS
ORAL_CAPSULE | ORAL | Status: AC
  Filled 2019-02-08: qty 2

## 2019-02-08 MED ORDER — MONTELUKAST SODIUM 10 MG PO TABS
10.0000 mg | ORAL_TABLET | Freq: Once | ORAL | Status: AC
  Administered 2019-02-08: 10 mg via ORAL

## 2019-02-08 MED ORDER — DEXAMETHASONE 4 MG PO TABS
ORAL_TABLET | ORAL | Status: AC
  Filled 2019-02-08: qty 3

## 2019-02-08 MED ORDER — DEXAMETHASONE 4 MG PO TABS
10.0000 mg | ORAL_TABLET | Freq: Once | ORAL | Status: AC
  Administered 2019-02-08: 10:00:00 10 mg via ORAL

## 2019-02-08 MED ORDER — MONTELUKAST SODIUM 10 MG PO TABS
ORAL_TABLET | ORAL | Status: AC
  Filled 2019-02-08: qty 1

## 2019-02-08 MED ORDER — ACETAMINOPHEN 325 MG PO TABS
650.0000 mg | ORAL_TABLET | Freq: Once | ORAL | Status: AC
  Administered 2019-02-08: 650 mg via ORAL

## 2019-02-08 MED ORDER — DARATUMUMAB-HYALURONIDASE-FIHJ 1800-30000 MG-UT/15ML ~~LOC~~ SOLN
1800.0000 mg | Freq: Once | SUBCUTANEOUS | Status: AC
  Administered 2019-02-08: 1800 mg via SUBCUTANEOUS
  Filled 2019-02-08: qty 15

## 2019-02-08 MED ORDER — DIPHENHYDRAMINE HCL 25 MG PO CAPS
50.0000 mg | ORAL_CAPSULE | Freq: Once | ORAL | Status: AC
  Administered 2019-02-08: 50 mg via ORAL

## 2019-02-08 NOTE — Progress Notes (Signed)
Patient Edward Todd administered at 11:30. He remained for 2 hour post observation until 2:20 and then escorted via wheelchair for pick up.

## 2019-02-08 NOTE — Patient Instructions (Signed)
[facility] Discharge Instructions for Patients Receiving Chemotherapy  Today you received the following chemotherapy agents: Darzalex Faspro  To help prevent nausea and vomiting after your treatment, we encourage you to take your nausea medication as directed.    If you develop nausea and vomiting that is not controlled by your nausea medication, call the clinic.   BELOW ARE SYMPTOMS THAT SHOULD BE REPORTED IMMEDIATELY:  *FEVER GREATER THAN 100.5 F  *CHILLS WITH OR WITHOUT FEVER  NAUSEA AND VOMITING THAT IS NOT CONTROLLED WITH YOUR NAUSEA MEDICATION  *UNUSUAL SHORTNESS OF BREATH  *UNUSUAL BRUISING OR BLEEDING  TENDERNESS IN MOUTH AND THROAT WITH OR WITHOUT PRESENCE OF ULCERS  *URINARY PROBLEMS  *BOWEL PROBLEMS  UNUSUAL RASH Items with * indicate a potential emergency and should be followed up as soon as possible.  Feel free to call the clinic should you have any questions or concerns. The clinic phone number is [phone].  Please show the CHEMO ALERT CARD at check-in to the Emergency Department and triage nurse.   

## 2019-02-08 NOTE — Progress Notes (Signed)
[facility] CSW Progress Notes  Met w patient in infusion, reviewed progress towards goals.  Has recently been approved for [place]icaid, which is a relief to him and has reduced stress.  Is walking and trying to get moderate exercise as a way to deal with anxiety/stress.  Reviewed Advance Directives process, provided copies of documents and explanatory brochure.  Asked patient to contact undersigned when ready to complete documents and we will arrange an appointment.  He has also been contacted by counseling intern, Art Buff- he will call her next week and does want to engage in counseling services.  Edwyna Shell, LCSW Clinical Social Worker Phone:  [phone]

## 2019-02-08 NOTE — Progress Notes (Signed)
Confirmed w/ Juliann Pulse, patient has Medicaid that is active. Okay for Darzalex Huel Cote today from a prior auth standpoint.   Demetrius Charity, PharmD, [facility] Pharmacist Pharmacy Phone: [phone] 02/08/2019

## 2019-02-09 ENCOUNTER — Other Ambulatory Visit: Payer: Self-pay | Admitting: Hematology

## 2019-02-09 DIAGNOSIS — C9000 Multiple myeloma not having achieved remission: Secondary | ICD-10-CM

## 2019-02-09 LAB — CMV IGM: CMV IgM: <30 AU/mL (ref 0.0–29.9)

## 2019-02-09 LAB — CMV ANTIBODY, IGG (EIA): CMV Ab - IgG: 2.2 U/mL — ABNORMAL HIGH (ref 0.00–0.59)

## 2019-02-11 ENCOUNTER — Other Ambulatory Visit: Payer: Self-pay | Admitting: Hematology

## 2019-02-11 ENCOUNTER — Telehealth: Payer: Self-pay | Admitting: Hematology

## 2019-02-11 MED ORDER — HYDROCODONE-ACETAMINOPHEN 5-300 MG PO TABS: 0.5000 | ORAL_TABLET | Freq: Two times a day (BID) | ORAL | 0 refills | Status: DC | PRN

## 2019-02-11 NOTE — Telephone Encounter (Signed)
YF/VT PAL moved 10/23 follow up to LT. Spoke with patient re new time.   Per patient he needs a refill for oxycodone. Per patient he was trying not to use it however, the pain is too great and he needs a refill. Message to Dr. Burr Medico.

## 2019-02-11 NOTE — Telephone Encounter (Signed)
I will refill some hydrocordone, given possible relationship of his confusion and oxycodone, let him know, thanks   Truitt Merle MD

## 2019-02-11 NOTE — Telephone Encounter (Signed)
No los per 10/16.

## 2019-02-12 ENCOUNTER — Ambulatory Visit: Payer: 59 | Admitting: Orthopaedic Surgery

## 2019-02-12 LAB — CMV DNA, QUANTITATIVE, PCR
CMV DNA Quant: NEGATIVE IU/mL
Log10 CMV Qn DNA Pl: UNDETERMINED log10 IU/mL

## 2019-02-15 ENCOUNTER — Other Ambulatory Visit: Payer: Self-pay

## 2019-02-15 ENCOUNTER — Inpatient Hospital Stay: Payer: Medicaid Other

## 2019-02-15 ENCOUNTER — Inpatient Hospital Stay (HOSPITAL_BASED_OUTPATIENT_CLINIC_OR_DEPARTMENT_OTHER): Payer: Medicaid Other | Admitting: Nurse Practitioner

## 2019-02-15 ENCOUNTER — Encounter: Payer: Self-pay | Admitting: Nurse Practitioner

## 2019-02-15 ENCOUNTER — Other Ambulatory Visit: Payer: 59

## 2019-02-15 ENCOUNTER — Ambulatory Visit: Payer: 59

## 2019-02-15 VITALS — BP 123/87 | HR 99 | Temp 98.3°F | Resp 18 | Ht 75.0 in | Wt 209.6 lb

## 2019-02-15 VITALS — BP 138/86 | HR 74 | Temp 98.2°F | Resp 20

## 2019-02-15 DIAGNOSIS — E119 Type 2 diabetes mellitus without complications: Secondary | ICD-10-CM

## 2019-02-15 DIAGNOSIS — C9000 Multiple myeloma not having achieved remission: Secondary | ICD-10-CM

## 2019-02-15 DIAGNOSIS — Z5112 Encounter for antineoplastic immunotherapy: Secondary | ICD-10-CM | POA: Diagnosis not present

## 2019-02-15 LAB — CBC WITH DIFFERENTIAL (CANCER CENTER ONLY)
Abs Immature Granulocytes: 0.02 10*3/uL (ref 0.00–0.07)
Basophils Absolute: 0 10*3/uL (ref 0.0–0.1)
Basophils Relative: 0 %
Eosinophils Absolute: 0 10*3/uL (ref 0.0–0.5)
Eosinophils Relative: 0 %
HCT: 30.8 % — ABNORMAL LOW (ref 39.0–52.0)
Hemoglobin: 10.2 g/dL — ABNORMAL LOW (ref 13.0–17.0)
Immature Granulocytes: 0 %
Lymphocytes Relative: 11 %
Lymphs Abs: 0.7 10*3/uL (ref 0.7–4.0)
MCH: 31 pg (ref 26.0–34.0)
MCHC: 33.1 g/dL (ref 30.0–36.0)
MCV: 93.6 fL (ref 80.0–100.0)
Monocytes Absolute: 0.1 10*3/uL (ref 0.1–1.0)
Monocytes Relative: 1 %
Neutro Abs: 5.5 10*3/uL (ref 1.7–7.7)
Neutrophils Relative %: 88 %
Platelet Count: 471 10*3/uL — ABNORMAL HIGH (ref 150–400)
RBC: 3.29 MIL/uL — ABNORMAL LOW (ref 4.22–5.81)
RDW: 16.3 % — ABNORMAL HIGH (ref 11.5–15.5)
WBC Count: 6.3 10*3/uL (ref 4.0–10.5)
nRBC: 0 % (ref 0.0–0.2)

## 2019-02-15 LAB — CMP (CANCER CENTER ONLY)
ALT: 11 U/L (ref 0–44)
AST: 7 U/L — ABNORMAL LOW (ref 15–41)
Albumin: 3.7 g/dL (ref 3.5–5.0)
Alkaline Phosphatase: 89 U/L (ref 38–126)
Anion gap: 12 (ref 5–15)
BUN: 16 mg/dL (ref 6–20)
CO2: 18 mmol/L — ABNORMAL LOW (ref 22–32)
Calcium: 9.2 mg/dL (ref 8.9–10.3)
Chloride: 106 mmol/L (ref 98–111)
Creatinine: 1.04 mg/dL (ref 0.61–1.24)
GFR, Est AFR Am: >60 mL/min (ref >60)
GFR, Estimated: >60 mL/min (ref >60)
Glucose, Bld: 416 mg/dL — ABNORMAL HIGH (ref 70–99)
Potassium: 4.3 mmol/L (ref 3.5–5.1)
Sodium: 136 mmol/L (ref 135–145)
Total Bilirubin: 0.3 mg/dL (ref 0.3–1.2)
Total Protein: 8 g/dL (ref 6.5–8.1)

## 2019-02-15 LAB — GLUCOSE, CAPILLARY: Glucose-Capillary: 268 mg/dL — ABNORMAL HIGH (ref 70–99)

## 2019-02-15 MED ORDER — DIPHENHYDRAMINE HCL 25 MG PO CAPS
ORAL_CAPSULE | ORAL | Status: AC
  Filled 2019-02-15: qty 2

## 2019-02-15 MED ORDER — DIPHENHYDRAMINE HCL 25 MG PO CAPS
50.0000 mg | ORAL_CAPSULE | Freq: Once | ORAL | Status: AC
  Administered 2019-02-15: 50 mg via ORAL

## 2019-02-15 MED ORDER — ACETAMINOPHEN 325 MG PO TABS
ORAL_TABLET | ORAL | Status: AC
  Filled 2019-02-15: qty 2

## 2019-02-15 MED ORDER — DARATUMUMAB-HYALURONIDASE-FIHJ 1800-30000 MG-UT/15ML ~~LOC~~ SOLN
1800.0000 mg | Freq: Once | SUBCUTANEOUS | Status: AC
  Administered 2019-02-15: 1800 mg via SUBCUTANEOUS
  Filled 2019-02-15: qty 15

## 2019-02-15 MED ORDER — MONTELUKAST SODIUM 10 MG PO TABS
ORAL_TABLET | ORAL | Status: AC
  Filled 2019-02-15: qty 1

## 2019-02-15 MED ORDER — MONTELUKAST SODIUM 10 MG PO TABS
10.0000 mg | ORAL_TABLET | Freq: Once | ORAL | Status: AC
  Administered 2019-02-15: 10 mg via ORAL

## 2019-02-15 MED ORDER — METFORMIN HCL 1000 MG PO TABS: 1000.0000 mg | ORAL_TABLET | Freq: Two times a day (BID) | ORAL | 1 refills | Status: DC

## 2019-02-15 MED ORDER — INSULIN REGULAR HUMAN 100 UNIT/ML IJ SOLN
15.0000 [IU] | Freq: Once | INTRAMUSCULAR | Status: AC
  Administered 2019-02-15: 15 [IU] via SUBCUTANEOUS
  Filled 2019-02-15: qty 10

## 2019-02-15 MED ORDER — ACETAMINOPHEN 325 MG PO TABS
650.0000 mg | ORAL_TABLET | Freq: Once | ORAL | Status: AC
  Administered 2019-02-15: 650 mg via ORAL

## 2019-02-15 NOTE — Progress Notes (Signed)
Per Ned Card, NP: Hold oral decadron today. Give 15 units regular insulin now and recheck CBG 1 hour after dose and call to NP. OK to treat.

## 2019-02-15 NOTE — Patient Instructions (Signed)
[facility] Discharge Instructions for Patients Receiving Chemotherapy  Today you received the following Immunotherapy: Daratumumab  To help prevent nausea and vomiting after your treatment, we encourage you to take your nausea medication as directed by your MD.   If you develop nausea and vomiting that is not controlled by your nausea medication, call the clinic.   BELOW ARE SYMPTOMS THAT SHOULD BE REPORTED IMMEDIATELY:  *FEVER GREATER THAN 100.5 F  *CHILLS WITH OR WITHOUT FEVER  NAUSEA AND VOMITING THAT IS NOT CONTROLLED WITH YOUR NAUSEA MEDICATION  *UNUSUAL SHORTNESS OF BREATH  *UNUSUAL BRUISING OR BLEEDING  TENDERNESS IN MOUTH AND THROAT WITH OR WITHOUT PRESENCE OF ULCERS  *URINARY PROBLEMS  *BOWEL PROBLEMS  UNUSUAL RASH Items with * indicate a potential emergency and should be followed up as soon as possible.  Feel free to call the clinic should you have any questions or concerns. The clinic phone number is [phone].  Please show the Magazine at check-in to the Emergency Department and triage nurse. Coronavirus (COVID-19) Are you at risk?  Are you at risk for the Coronavirus (COVID-19)?  To be considered HIGH RISK for Coronavirus (COVID-19), you have to meet the following criteria:  . Traveled to [place], [place], [place], [place] or [place]; or in the [place] to [place], [place], [place], or [place]; and have fever, cough, and shortness of breath within the last 2 weeks of travel OR . Been in close contact with a person diagnosed with COVID-19 within the last 2 weeks and have fever, cough, and shortness of breath . IF YOU DO NOT MEET THESE CRITERIA, YOU ARE CONSIDERED LOW RISK FOR COVID-19.  What to do if you are HIGH RISK for COVID-19?  Marland Kitchen If you are having a medical emergency, call 911. . Seek medical care right away. Before you go to a doctor's office, urgent care or emergency department, call ahead and tell them about your  recent travel, contact with someone diagnosed with COVID-19, and your symptoms. You should receive instructions from your physician's office regarding next steps of care.  . When you arrive at healthcare provider, tell the healthcare staff immediately you have returned from visiting [place], [place], [place], [place] or [place]; or traveled in the [place] to [place], [place], [place], or [place]; in the last two weeks or you have been in close contact with a person diagnosed with COVID-19 in the last 2 weeks.   . Tell the health care staff about your symptoms: fever, cough and shortness of breath. . After you have been seen by a medical provider, you will be either: o Tested for (COVID-19) and discharged home on quarantine except to seek medical care if symptoms worsen, and asked to  - Stay home and avoid contact with others until you get your results (4-5 days)  - Avoid travel on public transportation if possible (such as bus, train, or airplane) or o Sent to the Emergency Department by EMS for evaluation, COVID-19 testing, and possible admission depending on your condition and test results.  What to do if you are LOW RISK for COVID-19?  Reduce your risk of any infection by using the same precautions used for avoiding the common cold or flu:  Marland Kitchen Wash your hands often with soap and warm water for at least 20 seconds.  If soap and water are not readily available, use an alcohol-based hand sanitizer with at least 60% alcohol.  . If coughing or  sneezing, cover your mouth and nose by coughing or sneezing into the elbow areas of your shirt or coat, into a tissue or into your sleeve (not your hands). . Avoid shaking hands with others and consider head nods or verbal greetings only. . Avoid touching your eyes, nose, or mouth with unwashed hands.  . Avoid close contact with people who are sick. . Avoid places or events with large numbers of people in one location, like concerts or sporting  events. . Carefully consider travel plans you have or are making. . If you are planning any travel outside or inside the [place], visit the CDC's Travelers' Health webpage for the latest health notices. . If you have some symptoms but not all symptoms, continue to monitor at home and seek medical attention if your symptoms worsen. . If you are having a medical emergency, call 911.   [place] / e-Visit: [email or web address]         [facility]: [facility]: [phone]                   [facility]: [phone]

## 2019-02-15 NOTE — Progress Notes (Signed)
Pt. Glucose = 416 received Regular Novolin Insulin 15 Units. One hour after Insulin recheck CBG=268 NP notified. Per NP,  instructed pt. to monitor blood sugar and take medication as prescribed. Also informed pt. that prescription for Metforman was sent in, pt states he understands.

## 2019-02-15 NOTE — Progress Notes (Signed)
  Edward Todd OFFICE PROGRESS NOTE   Diagnosis: Multiple myeloma  INTERVAL HISTORY:   Edward Todd returns as scheduled.  He began the current cycle of Pomalyst 2 mg 3 weeks on/1 week off on 02/06/2019.  He completed a cycle of daratumumab 02/08/2019.  He took dexamethasone 8 mg yesterday and again today for this appointment.  He denies nausea/vomiting.  No mouth sores.  No diarrhea.  He describes his appetite is "great".  He notes improved control of blood sugars.  He did "run out" of Metformin several days ago.  He takes insulin as needed.  Pain is adequately controlled with hydrocodone.  He continues to fatigue easily but overall feels he is improving in terms of strength.  Objective:  Vital signs in last 24 hours:  Blood pressure 123/87, pulse 99, temperature 98.3 F (36.8 C), temperature source Oral, resp. rate 18, height 6' 3" (1.905 m), weight 209 lb 9.6 oz (95.1 kg), SpO2 99 %.    HEENT: Mild white coating of her tongue.  No buccal thrush. GI: Abdomen soft and nontender.  No hepatomegaly. Vascular: No leg edema. Neuro: Alert and oriented. Skin: Small ecchymosis left abdomen injection site.   Lab Results:  Lab Results  Component Value Date   WBC 6.3 02/15/2019   HGB 10.2 (L) 02/15/2019   HCT 30.8 (L) 02/15/2019   MCV 93.6 02/15/2019   PLT 471 (H) 02/15/2019   NEUTROABS 5.5 02/15/2019    Imaging:  No results found.  Medications: I have reviewed the patient's current medications.  Assessment/Plan: 1. Multiple myeloma 2. Anemia secondary to #1 3. Severe low back pain, headaches  Secondary to thoracic and lumbar compression fractures, lumbar spine stenosis seen on 08/31/2018 survey 4. Diabetes, type II on Metformin 5. History of stress, hallucinations, headaches and nausea  Disposition: Mr. Edward Todd appears stable.  He continues Pomalyst, beginning the most recent cycle 02/06/2019.  Plan to proceed with daratumumab today as scheduled.  He took  dexamethasone 8 mg yesterday and again this morning.  We are holding the additional 10 mg he typically receives in the office due to the markedly elevated blood sugar.  He has not taken Metformin for the past several days.  We are sending a new prescription to his pharmacy.  He will receive 15 units of regular insulin while here, repeat CBG 1 hour after.  He has insulin on a sliding scale at home.  He will avoid concentrated sweets.  He will maintain adequate hydration.  He will monitor his blood sugars closely at home and contact Dr. Loanne Drilling with persistent elevated readings.  He will return for lab, follow-up, daratumumab in 1 week.  He will contact the office in the interim with any problems.  Plan reviewed with Dr. Earlie Server.  25 minutes were spent face-to-face at today's visit with the majority of that time involved in counseling/coordination of care.    Ned Card ANP/GNP-BC   02/15/2019  10:37 AM

## 2019-02-18 LAB — KAPPA/LAMBDA LIGHT CHAINS
Kappa free light chain: 38 mg/L — ABNORMAL HIGH (ref 3.3–19.4)
Kappa, lambda light chain ratio: 11.18 — ABNORMAL HIGH (ref 0.26–1.65)
Lambda free light chains: 3.4 mg/L — ABNORMAL LOW (ref 5.7–26.3)

## 2019-02-18 LAB — MULTIPLE MYELOMA PANEL, SERUM
Albumin SerPl Elph-Mcnc: 3.9 g/dL (ref 2.9–4.4)
Albumin/Glob SerPl: 1.1 (ref 0.7–1.7)
Alpha 1: 0.2 g/dL (ref 0.0–0.4)
Alpha2 Glob SerPl Elph-Mcnc: 1 g/dL (ref 0.4–1.0)
B-Globulin SerPl Elph-Mcnc: 2.4 g/dL — ABNORMAL HIGH (ref 0.7–1.3)
Gamma Glob SerPl Elph-Mcnc: 0.1 g/dL — ABNORMAL LOW (ref 0.4–1.8)
Globulin, Total: 3.7 g/dL (ref 2.2–3.9)
IgA: 22 mg/dL — ABNORMAL LOW (ref 90–386)
IgG (Immunoglobin G), Serum: 1595 mg/dL (ref 603–1613)
IgM (Immunoglobulin M), Srm: 28 mg/dL (ref 20–172)
M Protein SerPl Elph-Mcnc: 1.5 g/dL — ABNORMAL HIGH
Total Protein ELP: 7.6 g/dL (ref 6.0–8.5)

## 2019-02-18 NOTE — Progress Notes (Signed)
Counseling Intern Notes:  Spoke to pt for intake counseling appt on 02/18/2019. Counselor provided space for pt to process his experience in tx for cancer and acknowledge the emotional impact of this experience. Counseling intern provided empathy, validation of feelings, and support. Pt reported feeling depressed since March with symptoms including a down mood, fatigue, weight loss, and not wanting to do things he used to like to do.  Pt stated that he has supportive people in his life, but most of his friends can't understand what he is going through. Pt stated it typically (pre-cancer) didn't take much for him to be happy, just taking a ride our going out to eat can usually lift his mood. Pt values caring people, going to church, "positive vibes", and he is grateful for friends and family who have stuck with him during this difficult time. He reported that he misses working and "never imagined" he would be on government disability. Pt denies any history or current SI. He reported he felt better after our conversation and felt that speaking with me every Monday would be a positive way to begin his week.    Next Counseling Appt:  02/25/2019 at 2:30pm via phone  Art Buff [facility] Counseling Intern Voicemail:  [phone]

## 2019-02-19 ENCOUNTER — Telehealth: Payer: Self-pay

## 2019-02-19 NOTE — Telephone Encounter (Signed)
Patient's wife Marguarite Arbour called about the patient having an appointment with Dr. Norma Fredrickson next week. She is wanting to know because he missed three treatments is he still on track for Phase 2 or should he reschedule with Dr. Norma Fredrickson?

## 2019-02-21 ENCOUNTER — Telehealth: Payer: Self-pay

## 2019-02-21 NOTE — Telephone Encounter (Signed)
Spoke with patient to let him know that Dr. Burr Medico is attempting to consult with Dr. Norma Fredrickson about his appointment next week.  She has not heard back as yet. The patient verbalized an understanding and is requesting a refill on Hydrocodone as he is completely out.  This was filled on 10/19 #15

## 2019-02-22 ENCOUNTER — Inpatient Hospital Stay (HOSPITAL_BASED_OUTPATIENT_CLINIC_OR_DEPARTMENT_OTHER): Payer: Medicaid Other | Admitting: Medical

## 2019-02-22 ENCOUNTER — Ambulatory Visit: Payer: 59

## 2019-02-22 ENCOUNTER — Inpatient Hospital Stay: Payer: Medicaid Other

## 2019-02-22 ENCOUNTER — Other Ambulatory Visit: Payer: 59

## 2019-02-22 ENCOUNTER — Other Ambulatory Visit: Payer: Self-pay

## 2019-02-22 VITALS — BP 135/96 | HR 88 | Resp 16

## 2019-02-22 VITALS — BP 120/96 | HR 118 | Temp 98.5°F | Resp 18 | Ht 75.0 in | Wt 211.3 lb

## 2019-02-22 DIAGNOSIS — C9000 Multiple myeloma not having achieved remission: Secondary | ICD-10-CM | POA: Diagnosis not present

## 2019-02-22 DIAGNOSIS — Z5112 Encounter for antineoplastic immunotherapy: Secondary | ICD-10-CM | POA: Diagnosis not present

## 2019-02-22 LAB — CMP (CANCER CENTER ONLY)
ALT: 10 U/L (ref 0–44)
AST: 7 U/L — ABNORMAL LOW (ref 15–41)
Albumin: 3.7 g/dL (ref 3.5–5.0)
Alkaline Phosphatase: 78 U/L (ref 38–126)
Anion gap: 15 (ref 5–15)
BUN: 13 mg/dL (ref 6–20)
CO2: 17 mmol/L — ABNORMAL LOW (ref 22–32)
Calcium: 9.1 mg/dL (ref 8.9–10.3)
Chloride: 105 mmol/L (ref 98–111)
Creatinine: 0.95 mg/dL (ref 0.61–1.24)
GFR, Est AFR Am: >60 mL/min (ref >60)
GFR, Estimated: >60 mL/min (ref >60)
Glucose, Bld: 295 mg/dL — ABNORMAL HIGH (ref 70–99)
Potassium: 3.9 mmol/L (ref 3.5–5.1)
Sodium: 137 mmol/L (ref 135–145)
Total Bilirubin: 0.3 mg/dL (ref 0.3–1.2)
Total Protein: 8 g/dL (ref 6.5–8.1)

## 2019-02-22 LAB — CBC WITH DIFFERENTIAL (CANCER CENTER ONLY)
Abs Immature Granulocytes: 0.02 10*3/uL (ref 0.00–0.07)
Basophils Absolute: 0 10*3/uL (ref 0.0–0.1)
Basophils Relative: 0 %
Eosinophils Absolute: 0 10*3/uL (ref 0.0–0.5)
Eosinophils Relative: 0 %
HCT: 31.3 % — ABNORMAL LOW (ref 39.0–52.0)
Hemoglobin: 10.9 g/dL — ABNORMAL LOW (ref 13.0–17.0)
Immature Granulocytes: 0 %
Lymphocytes Relative: 17 %
Lymphs Abs: 0.9 10*3/uL (ref 0.7–4.0)
MCH: 31.5 pg (ref 26.0–34.0)
MCHC: 34.8 g/dL (ref 30.0–36.0)
MCV: 90.5 fL (ref 80.0–100.0)
Monocytes Absolute: 0.2 10*3/uL (ref 0.1–1.0)
Monocytes Relative: 4 %
Neutro Abs: 4 10*3/uL (ref 1.7–7.7)
Neutrophils Relative %: 79 %
Platelet Count: 336 10*3/uL (ref 150–400)
RBC: 3.46 MIL/uL — ABNORMAL LOW (ref 4.22–5.81)
RDW: 15.9 % — ABNORMAL HIGH (ref 11.5–15.5)
WBC Count: 5.1 10*3/uL (ref 4.0–10.5)
nRBC: 0 % (ref 0.0–0.2)

## 2019-02-22 MED ORDER — ACETAMINOPHEN 325 MG PO TABS
650.0000 mg | ORAL_TABLET | Freq: Once | ORAL | Status: AC
  Administered 2019-02-22: 650 mg via ORAL

## 2019-02-22 MED ORDER — DEXAMETHASONE 4 MG PO TABS
ORAL_TABLET | ORAL | Status: AC
  Filled 2019-02-22: qty 3

## 2019-02-22 MED ORDER — MONTELUKAST SODIUM 10 MG PO TABS
ORAL_TABLET | ORAL | Status: AC
  Filled 2019-02-22: qty 1

## 2019-02-22 MED ORDER — DEXAMETHASONE 4 MG PO TABS
10.0000 mg | ORAL_TABLET | Freq: Once | ORAL | Status: AC
  Administered 2019-02-22: 10 mg via ORAL

## 2019-02-22 MED ORDER — DIPHENHYDRAMINE HCL 25 MG PO CAPS
ORAL_CAPSULE | ORAL | Status: AC
  Filled 2019-02-22: qty 2

## 2019-02-22 MED ORDER — DIPHENHYDRAMINE HCL 25 MG PO CAPS
50.0000 mg | ORAL_CAPSULE | Freq: Once | ORAL | Status: AC
  Administered 2019-02-22: 50 mg via ORAL

## 2019-02-22 MED ORDER — MONTELUKAST SODIUM 10 MG PO TABS
10.0000 mg | ORAL_TABLET | Freq: Once | ORAL | Status: AC
  Administered 2019-02-22: 10 mg via ORAL

## 2019-02-22 MED ORDER — DARATUMUMAB-HYALURONIDASE-FIHJ 1800-30000 MG-UT/15ML ~~LOC~~ SOLN
1800.0000 mg | Freq: Once | SUBCUTANEOUS | Status: AC
  Administered 2019-02-22: 1800 mg via SUBCUTANEOUS
  Filled 2019-02-22: qty 15

## 2019-02-22 MED ORDER — ACETAMINOPHEN 325 MG PO TABS
ORAL_TABLET | ORAL | Status: AC
  Filled 2019-02-22: qty 2

## 2019-02-22 MED ORDER — HYDROCODONE-ACETAMINOPHEN 5-300 MG PO TABS: 0.5000 | ORAL_TABLET | Freq: Two times a day (BID) | ORAL | 0 refills | Status: DC | PRN

## 2019-02-22 NOTE — Patient Instructions (Signed)
COVID-19: How to Protect Yourself and Others Know how it spreads  There is currently no vaccine to prevent coronavirus disease 2019 (COVID-19).  The best way to prevent illness is to avoid being exposed to this virus.  The virus is thought to spread mainly from person-to-person. ? Between people who are in close contact with one another (within about 6 feet). ? Through respiratory droplets produced when an infected person coughs, sneezes or talks. ? These droplets can land in the mouths or noses of people who are nearby or possibly be inhaled into the lungs. ? Some recent studies have suggested that COVID-19 may be spread by people who are not showing symptoms. Everyone should Clean your hands often  Wash your hands often with soap and water for at least 20 seconds especially after you have been in a public place, or after blowing your nose, coughing, or sneezing.  If soap and water are not readily available, use a hand sanitizer that contains at least 60% alcohol. Cover all surfaces of your hands and rub them together until they feel dry.  Avoid touching your eyes, nose, and mouth with unwashed hands. Avoid close contact  Stay home if you are sick.  Avoid close contact with people who are sick.  Put distance between yourself and other people. ? Remember that some people without symptoms may be able to spread virus. ? This is especially important for people who are at higher risk of getting very sick.www.cdc.gov/coronavirus/2019-ncov/need-extra-precautions/people-at-higher-risk.html Cover your mouth and nose with a cloth face cover when around others  You could spread COVID-19 to others even if you do not feel sick.  Everyone should wear a cloth face cover when they have to go out in public, for example to the grocery store or to pick up other necessities. ? Cloth face coverings should not be placed on young children under age 2, anyone who has trouble breathing, or is unconscious,  incapacitated or otherwise unable to remove the mask without assistance.  The cloth face cover is meant to protect other people in case you are infected.  Do NOT use a facemask meant for a healthcare worker.  Continue to keep about 6 feet between yourself and others. The cloth face cover is not a substitute for social distancing. Cover coughs and sneezes  If you are in a private setting and do not have on your cloth face covering, remember to always cover your mouth and nose with a tissue when you cough or sneeze or use the inside of your elbow.  Throw used tissues in the trash.  Immediately wash your hands with soap and water for at least 20 seconds. If soap and water are not readily available, clean your hands with a hand sanitizer that contains at least 60% alcohol. Clean and disinfect  Clean AND disinfect frequently touched surfaces daily. This includes tables, doorknobs, light switches, countertops, handles, desks, phones, keyboards, toilets, faucets, and sinks. www.cdc.gov/coronavirus/2019-ncov/prevent-getting-sick/disinfecting-your-home.html  If surfaces are dirty, clean them: Use detergent or soap and water prior to disinfection.  Then, use a household disinfectant. You can see a list of EPA-registered household disinfectants here. cdc.gov/coronavirus 08/28/2018 This information is not intended to replace advice given to you by your health care provider. Make sure you discuss any questions you have with your health care provider. Document Released: 08/07/2018 Document Revised: 09/05/2018 Document Reviewed: 08/07/2018 Elsevier Patient Education  2020 Elsevier Inc.  

## 2019-02-22 NOTE — Progress Notes (Signed)
[facility]pt[facility] Management Clinic Progress Note   Edward Todd [id number] [birth year] 43 y.o.  Edward Todd is managed by Dr. Truitt Merle  Actively treated with chemotherapy/immunotherapy/hormonal therapy: yes  Current therapy: Darzalex and Zometa  Last treated: 02/15/2019 (cycle 2, day 1)  Next scheduled appointment with provider: 03/01/2019  Assessment: Plan:    Multiple myeloma not having achieved remission ([place])   Multiple myeloma: Edward Todd presents to the clinic today for cycle 2, day 8 of Darzalex.  We will proceed with his treatment today with the patient to return to clinic on 03/01/2019 for follow-up.   Please see After Visit Summary for patient specific instructions.  Future Appointments  Date Time Provider [place]  02/26/2019  3:30 PM Marybelle Killings, MD [facility] None  03/01/2019 10:45 AM [facility] LAB 1 [facility] None  03/01/2019 11:20 AM Truitt Merle, MD [facility] None  03/01/2019 12:00 PM [facility] INFUSION [facility] None  03/08/2019  8:00 AM [facility] ONLY [facility] None  03/08/2019  8:20 AM Truitt Merle, MD [facility] None  03/08/2019  9:30 AM [facility] INFUSION [facility] None    No orders of the defined types were placed in this encounter.      Subjective:   Patient ID:  Edward Todd is a 43 y.o. (DOB 1976/01/17) male.  Chief Complaint:  Chief Complaint  Patient presents with  . Follow-up    HPI Edward Todd Is a 43 y.o. male with a diagnosis of multiple myeloma.  He presents to the clinic today for consideration of cycle 2, day 8 of Darzalex.  He reports that he is doing well overall.  He requests a refill of his hydrocodone today.  He has no concerns.  He denies nausea, vomiting, diarrhea, chest pain, or shortness of breath.  He is pending consideration of a bone marrow transplant is the least of member.  Medications: I have reviewed the patient's current medications.  Allergies:  Allergies  Allergen  Reactions  . Shellfish Allergy Rash    Past Medical History:  Diagnosis Date  . Cancer ([place])   . Diabetes mellitus without complication ([place])   . Hypertension     No past surgical history on file.  Family History  Problem Relation Age of Onset  . Diabetes Maternal Grandmother   . Diabetes Paternal Grandfather     Social History   Socioeconomic History  . Marital status: Married    Spouse name: Not on file  . Number of children: 1  . Years of education: Not on file  . Highest education level: Not on file  Occupational History  . Not on file  Social Needs  . Financial resource strain: Not on file  . Food insecurity    Worry: Not on file    Inability: Not on file  . Transportation needs    Medical: Not on file    Non-medical: Not on file  Tobacco Use  . Smoking status: Never Smoker  . Smokeless tobacco: Never Used  Substance and Sexual Activity  . Alcohol use: No    Alcohol/week: 0.0 standard drinks  . Drug use: No  . Sexual activity: Yes  Lifestyle  . Physical activity    Days per week: Not on file    Minutes per session: Not on file  . Stress: Not on file  Relationships  . Social Herbalist on phone: Not on file    Gets together: Not on file    Attends religious service: Not on file  Active member of club or organization: Not on file    Attends meetings of clubs or organizations: Not on file    Relationship status: Not on file  . Intimate partner violence    Fear of current or ex partner: Not on file    Emotionally abused: Not on file    Physically abused: Not on file    Forced sexual activity: Not on file  Other Topics Concern  . Not on file  Social History Narrative   Marital status:  married       Children:  1 child (64yo)      Lives: with son and wife.      Employment: [employer] x 2 months. Moved from [place].      Tobacco:  None      Alcohol: none      Drugs: none      Exercise:  Three days per week.        Seatbelt:  100%; some texting n driving.      Guns:  None      Sexual activity:  5; no STDs; females only.   Education: [employer]          Past Medical History, Surgical history, Social history, and Family history were reviewed and updated as appropriate.   Please see review of systems for further details on the patient's review from today.   Review of Systems:  Review of Systems  Constitutional: Negative for chills, diaphoresis and fever.  HENT: Negative for trouble swallowing and voice change.   Respiratory: Negative for cough, chest tightness, shortness of breath and wheezing.   Cardiovascular: Negative for chest pain and palpitations.  Gastrointestinal: Negative for abdominal pain, constipation, diarrhea, nausea and vomiting.  Musculoskeletal: Negative for back pain and myalgias.  Neurological: Negative for dizziness, light-headedness and headaches.    Objective:   Physical Exam:  BP (!) 120/96 (BP Location: Right Arm, Patient Position: Sitting)   Pulse (!) 118   Temp 98.5 F (36.9 C) (Temporal)   Resp 18   Ht '6\' 3"'  (1.905 m)   Wt 211 lb 4.8 oz (95.8 kg)   SpO2 100%   BMI 26.41 kg/m  ECOG: 0  Physical Exam Constitutional:      General: He is not in acute distress.    Appearance: He is not diaphoretic.  HENT:     Head: Normocephalic and atraumatic.  Eyes:     General: No scleral icterus.       Right eye: No discharge.        Left eye: No discharge.     Conjunctiva/sclera: Conjunctivae normal.  Cardiovascular:     Rate and Rhythm: Normal rate and regular rhythm.     Heart sounds: Normal heart sounds. No murmur. No friction rub. No gallop.   Pulmonary:     Effort: Pulmonary effort is normal. No respiratory distress.     Breath sounds: Normal breath sounds. No wheezing or rales.  Skin:    General: Skin is warm and dry.     Findings: No erythema or rash.  Neurological:     Mental Status: He is alert.     Gait: Gait abnormal (The patient is ambulating with the  use of a wheelchair today.).  Psychiatric:        Mood and Affect: Mood normal.        Behavior: Behavior normal.        Thought Content: Thought content normal.  Judgment: Judgment normal.     Lab Review:     Component Value Date/Time   NA 137 02/22/2019 0919   K 3.9 02/22/2019 0919   CL 105 02/22/2019 0919   CO2 17 (L) 02/22/2019 0919   GLUCOSE 295 (H) 02/22/2019 0919   BUN 13 02/22/2019 0919   CREATININE 0.95 02/22/2019 0919   CREATININE 1.17 10/09/2015 1504   CALCIUM 9.1 02/22/2019 0919   PROT 8.0 02/22/2019 0919   ALBUMIN 3.7 02/22/2019 0919   AST 7 (L) 02/22/2019 0919   ALT 10 02/22/2019 0919   ALKPHOS 78 02/22/2019 0919   BILITOT 0.3 02/22/2019 0919   GFRNONAA >60 02/22/2019 0919   GFRAA >60 02/22/2019 0919       Component Value Date/Time   WBC 5.1 02/22/2019 0919   WBC 1.4 (LL) 01/25/2019 0607   RBC 3.46 (L) 02/22/2019 0919   HGB 10.9 (L) 02/22/2019 0919   HCT 31.3 (L) 02/22/2019 0919   PLT 336 02/22/2019 0919   MCV 90.5 02/22/2019 0919   MCV 92.3 02/14/2013 2009   MCH 31.5 02/22/2019 0919   MCHC 34.8 02/22/2019 0919   RDW 15.9 (H) 02/22/2019 0919   LYMPHSABS 0.9 02/22/2019 0919   MONOABS 0.2 02/22/2019 0919   EOSABS 0.0 02/22/2019 0919   BASOSABS 0.0 02/22/2019 0919   -------------------------------  Imaging from last 24 hours (if applicable):  Radiology interpretation: Dg Chest 2 View  Result Date: 01/24/2019 CLINICAL DATA:  43 year old male with weakness headache and vomiting. Recent diagnosis of multiple myeloma. EXAM: CHEST - 2 VIEW COMPARISON:  Portable chest 11/30/2018 and earlier. FINDINGS: PA and lateral views of the chest. Improved lung volumes. Normal cardiac size and mediastinal contours. Visualized tracheal air column is within normal limits. Both lungs appear clear. No pneumothorax or pleural effusion. Negative visible bowel gas pattern. Multilevel thoracic compression fractures appear grossly stable from the MRI on 09/01/2018. No  new osseous abnormality identified. IMPRESSION: No acute cardiopulmonary abnormality. Electronically Signed   By: Genevie Ann M.D.   On: 01/24/2019 19:04   Ct Head Wo Contrast  Result Date: 01/25/2019 CLINICAL DATA:  Headache with nausea and vomiting. Recent hallucinations EXAM: CT HEAD WITHOUT CONTRAST TECHNIQUE: Contiguous axial images were obtained from the base of the skull through the vertex without intravenous contrast. COMPARISON:  None. FINDINGS: Brain: Ventricles are normal in size and configuration. Prominence of the cisterna magna is an anatomic variant. There is no intracranial mass, hemorrhage, extra-axial fluid collection, or midline shift. Brain parenchyma appears unremarkable. No demonstrable acute infarct. Vascular: There is no hyperdense vessel. There is no appreciable vascular calcification. Skull: Bony calvarium appears intact. Sinuses/Orbits: Visualized paranasal sinuses are clear. Orbits appear symmetric bilaterally. Other: Mastoid air cells are clear. IMPRESSION: Study within normal limits. Electronically Signed   By: Lowella Grip III M.D.   On: 01/25/2019 12:23

## 2019-02-22 NOTE — Patient Instructions (Signed)
[place] [facility] Discharge Instructions for Patients Receiving Chemotherapy  Today you received the following Immunotherapy: Daratumumab  To help prevent nausea and vomiting after your treatment, we encourage you to take your nausea medication as directed by your MD.   If you develop nausea and vomiting that is not controlled by your nausea medication, call the clinic.   BELOW ARE SYMPTOMS THAT SHOULD BE REPORTED IMMEDIATELY:  *FEVER GREATER THAN 100.5 F  *CHILLS WITH OR WITHOUT FEVER  NAUSEA AND VOMITING THAT IS NOT CONTROLLED WITH YOUR NAUSEA MEDICATION  *UNUSUAL SHORTNESS OF BREATH  *UNUSUAL BRUISING OR BLEEDING  TENDERNESS IN MOUTH AND THROAT WITH OR WITHOUT PRESENCE OF ULCERS  *URINARY PROBLEMS  *BOWEL PROBLEMS  UNUSUAL RASH Items with * indicate a potential emergency and should be followed up as soon as possible.  Feel free to call the clinic should you have any questions or concerns. The clinic phone number is [phone].  Please show the [facility] at check-in to the Emergency Department and triage nurse. Coronavirus (COVID-19) Are you at risk?  Are you at risk for the Coronavirus (COVID-19)?  To be considered HIGH RISK for Coronavirus (COVID-19), you have to meet the following criteria:  . Traveled to [place], [place], [place], [place] or [place]; or in the [place] to [place], [place], [place], or [place]; and have fever, cough, and shortness of breath within the last 2 weeks of travel OR . Been in close contact with a person diagnosed with COVID-19 within the last 2 weeks and have fever, cough, and shortness of breath . IF YOU DO NOT MEET THESE CRITERIA, YOU ARE CONSIDERED LOW RISK FOR COVID-19.  What to do if you are HIGH RISK for COVID-19?  Marland Kitchen If you are having a medical emergency, call 911. . Seek medical care right away. Before you go to a doctor's office, urgent care or emergency department, call ahead and tell them about your  recent travel, contact with someone diagnosed with COVID-19, and your symptoms. You should receive instructions from your physician's office regarding next steps of care.  . When you arrive at healthcare provider, tell the healthcare staff immediately you have returned from visiting [place], [place], [place], [place] or [place]; or traveled in the [place] to [place], [place], [place], or [place]; in the last two weeks or you have been in close contact with a person diagnosed with COVID-19 in the last 2 weeks.   . Tell the health care staff about your symptoms: fever, cough and shortness of breath. . After you have been seen by a medical provider, you will be either: o Tested for (COVID-19) and discharged home on quarantine except to seek medical care if symptoms worsen, and asked to  - Stay home and avoid contact with others until you get your results (4-5 days)  - Avoid travel on public transportation if possible (such as bus, train, or airplane) or o Sent to the Emergency Department by EMS for evaluation, COVID-19 testing, and possible admission depending on your condition and test results.  What to do if you are LOW RISK for COVID-19?  Reduce your risk of any infection by using the same precautions used for avoiding the common cold or flu:  Marland Kitchen Wash your hands often with soap and warm water for at least 20 seconds.  If soap and water are not readily available, use an alcohol-based hand sanitizer with at least 60% alcohol.  . If coughing or  sneezing, cover your mouth and nose by coughing or sneezing into the elbow areas of your shirt or coat, into a tissue or into your sleeve (not your hands). . Avoid shaking hands with others and consider head nods or verbal greetings only. . Avoid touching your eyes, nose, or mouth with unwashed hands.  . Avoid close contact with people who are sick. . Avoid places or events with large numbers of people in one location, like concerts or sporting  events. . Carefully consider travel plans you have or are making. . If you are planning any travel outside or inside the [place], visit the CDC's Travelers' Health webpage for the latest health notices. . If you have some symptoms but not all symptoms, continue to monitor at home and seek medical attention if your symptoms worsen. . If you are having a medical emergency, call 911.   [place] / e-Visit: [email or web address]         [facility]: [facility]: [phone]                   [facility]: [phone]

## 2019-02-24 NOTE — Telephone Encounter (Signed)
I spoke with Dr. Norma Fredrickson, his office will call pt directly about his appointment.   Truitt Merle MD

## 2019-02-25 NOTE — Progress Notes (Signed)
Patient did not answer phone today at 2:30pm for a scheduled phone counseling session.  Counselor left VM for patient encouraging him to call back to reschedule.    Art Buff [facility] Counseling Intern Voicemail:  [phone]

## 2019-02-26 ENCOUNTER — Ambulatory Visit: Payer: 59 | Admitting: Orthopaedic Surgery

## 2019-02-27 ENCOUNTER — Telehealth: Payer: Self-pay

## 2019-02-27 NOTE — Telephone Encounter (Signed)
Faxed script for Pomalyst along with [employer] authorization [id number] to [facility] at fax [phone], sent to HIM for scanning to chart.

## 2019-02-28 ENCOUNTER — Telehealth: Payer: Self-pay

## 2019-02-28 NOTE — Telephone Encounter (Signed)
Received calls from [facility] and [facility] that they are unable to send the patient's Pomalyst out because he report his condom broke during sexual intercourse last week.  This is considered a birth control failure and they are investigating further and will get back to [place].  His enrollment for Pomalyst is currently deactivated.

## 2019-02-28 NOTE — Progress Notes (Signed)
[place]   [phone] [phone]   Clinic Follow up Note   Patient Care Team: London Pepper, MD as PCP - General (Family Medicine)  Date of Service:  03/01/2019  CHIEF COMPLAINT: F/u of MM  SUMMARY OF ONCOLOGIC HISTORY: Oncology History Overview Note  Cancer Staging Multiple myeloma ([place]) Staging form: Plasma Cell Myeloma and Plasma Cell Disorders, AJCC 8th Edition - Clinical stage from 09/04/2018: RISS Stage III (Beta-2-microglobulin (mg/L): 5.5, Albumin (g/dL): 2.6, ISS: Stage III, High-risk cytogenetics: Absent, LDH: Elevated) - Signed by Truitt Merle, MD on 09/26/2018     Multiple myeloma ([place])  08/31/2018 Initial Diagnosis   Multiple myeloma ([place])   09/04/2018 Cancer Staging   Staging form: Plasma Cell Myeloma and Plasma Cell Disorders, AJCC 8th Edition - Clinical stage from 09/04/2018: RISS Stage III (Beta-2-microglobulin (mg/L): 5.5, Albumin (g/dL): 2.6, ISS: Stage III, High-risk cytogenetics: Absent, LDH: Elevated) - Signed by Truitt Merle, MD on 09/26/2018   09/04/2018 Initial Biopsy   Diagnosis 09/04/18 Bone Marrow, Aspirate,Biopsy, and Clot, left posterior iliac crest BONE MARROW: - CELLULAR MARROW WITH INVOLVEMENT BY PLASMA CELL NEOPLASM (>95%) - SEE COMMENT PERIPHERAL BLOOD: - NORMOCYTIC ANEMIA - ROULEAUX FORMATION - SEE COMPLETE BLOOD COUNT   09/05/2018 - 12/28/2018 Chemotherapy   Weekly velcade injection and dexamethasone 67m starting 09/05/18             -increase Velcade injection to twice a week (Fridays/Mondays) for 2 weeks on, 1 week off starting 10/19/18. Will return to weekly Velcade starting 11/09/18.              -Dexa stopped 12/14/18 due to hyperglycemia. Restart on 9/4 at 242mWeekly.              -Stop Velcade and change Dexa after 12/28/18.    09/14/2018 - 12/21/2018 Chemotherapy   Revlimid 2573m weeks on/1 week off starting 09/14/18. Stopped 12/21/18 due to abdominal pain.     12/29/2018 -  Chemotherapy   Pomalyst 4mg90mweeks  on/ 1 week off starting 12/29/18.   -Due to neutropenia, possible related hallucination Pomalyst was reduced to 2mg 78mrting 02/06/19   01/04/2019 -  Chemotherapy   IV weekly Daratumumab and oral Dexa 8mg d68mbefore infusion starting 01/04/19             -changed Dara infusion to Faspro injection starting 02/08/19      PREVIOUS AND CURRENT THERAPY:  1. Weekly velcade injection and dexamethasone 40mg s58ming 09/05/18,Revlimid 25mgdai68mweeks onand 1 week off starting 09/14/18.  -increase Velcade injection to twice a week (Fridays/Mondays) for 2 weeks on, 1 week off starting 10/19/18. Will return to weekly Velcade starting 11/09/18 due to poor tolerance. -Dexa stopped 12/14/18 due to hyperglycemia. Will restart on 9/4 at 20mg wee72m  -Due to Abdominal pain, Revlimid stopped 12/21/18. -Stop Velcade and change Dexa after 12/28/18. 2. MonthlyZometastarted on 09/05/2018 3. IV weekly Daratumumab and oral Dexa 8mg day b71mre and day of infusion starting 01/04/19, Pomalyst 4mg 3 week1mn/ 1 week off starting 12/29/18. -Due to neutropenia, possible related hallucination Pomalyst was reduced to 2mg startin21m0/14/20 and Dara held for 2 doses -changed Dara infusion to Faspro injection starting 02/08/19  INTERVAL HISTORY:  [name] a follow up of MM. He presents to the clinic alone. He notes he is doing well. He has been able to start ambulating with cane now. He has gained weight now 215 pounds. He notes his back pain is now managing and  he can exercise more. He has been taking Hydrocodone BID lately. He plans to see Dr. Norma Fredrickson soon. He notes his last dose of current cycle was 02/28/19. He notes he had intercourse with his wife last week and afterward his condom broke, not during intercourse. He notes she is on birth control separately. He notes he is tolerating Pomalyst 80m better and tolerating injection well. He  notes he did have dysuria recently with Pomalyst.   REVIEW OF SYSTEMS:   Constitutional: Denies fevers, chills or abnormal weight loss Eyes: Denies blurriness of vision Ears, nose, mouth, throat, and face: Denies mucositis or sore throat Respiratory: Denies cough, dyspnea or wheezes Cardiovascular: Denies palpitation, chest discomfort or lower extremity swelling Gastrointestinal:  Denies nausea, heartburn or change in bowel habits Skin: Denies abnormal skin rashes Lymphatics: Denies new lymphadenopathy or easy bruising Neurological:Denies numbness, tingling or new weaknesses Behavioral/Psych: Mood is stable, no new changes  All other systems were reviewed with the patient and are negative.  MEDICAL HISTORY:  Past Medical History:  Diagnosis Date   Cancer ([facility]    Diabetes mellitus without complication ([facility]    Hypertension     SURGICAL HISTORY: History reviewed. No pertinent surgical history.  I have reviewed the social history and family history with the patient and they are unchanged from previous note.  ALLERGIES:  is allergic to shellfish allergy.  MEDICATIONS:  Current Outpatient Medications  Medication Sig Dispense Refill   acetaminophen (TYLENOL) 500 MG tablet Take 500 mg by mouth every 6 (six) hours as needed for mild pain.     acyclovir (ZOVIRAX) 400 MG tablet TAKE 1 TABLET BY MOUTH TWICE A DAY (Patient taking differently: Take 400 mg by mouth 2 (two) times daily. ) 60 tablet 0   aspirin 81 MG chewable tablet Chew 1 tablet (81 mg total) by mouth daily.     calcium-vitamin D (OSCAL WITH D) 500-200 MG-UNIT tablet Take 1 tablet by mouth 2 (two) times daily. 60 tablet 1   Continuous Blood Gluc Receiver (FREESTYLE LIBRE 14 DAY READER) DEVI See admin instructions.     Continuous Blood Gluc Sensor (FREESTYLE LIBRE 14 DAY SENSOR) MISC APPLY EVERY 14 DAYS     dexamethasone (DECADRON) 4 MG tablet Take 5 tablets (20 mg total) by mouth every Friday. 20 tablet 2    HUMALOG KWIKPEN 100 UNIT/ML KwikPen Inject 0-10 Units into the skin daily as needed (blood sugar).      HYDROcodone-Acetaminophen 5-300 MG TABS Take 0.5-1 tablets by mouth every 12 (twelve) hours as needed. 30 tablet 0   Melatonin 3 MG CAPS Take 1 capsule (3 mg total) by mouth at bedtime as needed (sleep). 30 capsule 0   metFORMIN (GLUCOPHAGE) 1000 MG tablet Take 1 tablet (1,000 mg total) by mouth 2 (two) times daily with a meal. 180 tablet 1   methocarbamol (ROBAXIN) 750 MG tablet Take 1 tablet (750 mg total) by mouth 3 (three) times daily as needed for muscle spasms. 90 tablet 1   metoCLOPramide (REGLAN) 10 MG tablet Take 1 tablet (10 mg total) by mouth every 8 (eight) hours as needed for nausea. 20 tablet 0   ondansetron (ZOFRAN-ODT) 4 MG disintegrating tablet Take 1 tablet (4 mg total) by mouth every 6 (six) hours as needed for nausea or vomiting. 20 tablet 0   ONETOUCH VERIO test strip USE TO CHECK BLOOD SUGAR 3 TIMES A DAY     pantoprazole (PROTONIX) 40 MG tablet TAKE 1 TABLET (40 MG TOTAL) BY MOUTH DAILY AT  6 (SIX) AM. 90 tablet 1   polyethylene glycol (MIRALAX / GLYCOLAX) 17 g packet Take 17 g by mouth daily. (Patient taking differently: Take 17 g by mouth daily as needed for mild constipation. ) 14 each 0   pomalidomide (POMALYST) 2 MG capsule Take 1 capsule (2 mg total) by mouth daily. [employer] Auth # [id number]     Date Obtained 01/18/2019 21 capsule 0   potassium chloride (KLOR-CON) 20 MEQ packet Take 20 mEq by mouth daily. 30 packet 1   senna-docusate (SENOKOT-S) 8.6-50 MG tablet Take 1 tablet by mouth 2 (two) times daily. (Patient taking differently: Take 1 tablet by mouth 2 (two) times daily as needed for mild constipation. )     No current facility-administered medications for this visit.    Facility-Administered Medications Ordered in Other Visits  Medication Dose Route Frequency Provider Last Rate Last Dose   acetaminophen (TYLENOL) tablet 650 mg  650 mg Oral Once Truitt Merle, MD       daratumumab-hyaluronidase-fihj [facility] FASPRO) 1800-30000 MG-UT/15ML chemo SQ injection 1,800 mg  1,800 mg Subcutaneous Once Truitt Merle, MD       dexamethasone (DECADRON) tablet 10 mg  10 mg Oral Once Truitt Merle, MD       diphenhydrAMINE (BENADRYL) capsule 50 mg  50 mg Oral Once Truitt Merle, MD       montelukast (SINGULAIR) tablet 10 mg  10 mg Oral Once Truitt Merle, MD        PHYSICAL EXAMINATION: ECOG PERFORMANCE STATUS: 2 - Symptomatic, <50% confined to bed  Vitals:   03/01/19 0958  BP: 100/88  Pulse: 94  Resp: 18  Temp: 98.3 F (36.8 C)  SpO2: 100%   Filed Weights   03/01/19 0958  Weight: 215 lb 8 oz (97.8 kg)    GENERAL:alert, no distress and comfortable SKIN: skin color, texture, turgor are normal, no rashes or significant lesions EYES: normal, Conjunctiva are pink and non-injected, sclera clear  NECK: supple, thyroid normal size, non-tender, without nodularity LYMPH:  no palpable lymphadenopathy in the cervical, axillary  LUNGS: clear to auscultation and percussion with normal breathing effort HEART: regular rate & rhythm and no murmurs and no lower extremity edema ABDOMEN:abdomen soft, non-tender and normal bowel sounds Musculoskeletal:no cyanosis of digits and no clubbing  NEURO: alert & oriented x 3 with fluent speech, no focal motor/sensory deficits  LABORATORY DATA:  I have reviewed the data as listed CBC Latest Ref Rng & Units 03/01/2019 02/22/2019 02/15/2019  WBC 4.0 - 10.5 K/uL 1.8(L) 5.1 6.3  Hemoglobin 13.0 - 17.0 g/dL 10.8(L) 10.9(L) 10.2(L)  Hematocrit 39.0 - 52.0 % 32.1(L) 31.3(L) 30.8(L)  Platelets 150 - 400 K/uL 236 336 471(H)     CMP Latest Ref Rng & Units 03/01/2019 02/22/2019 02/15/2019  Glucose 70 - 99 mg/dL 322(H) 295(H) 416(H)  BUN 6 - 20 mg/dL _0 Creatinine 0.61 - 1.24 mg/dL 1.05 0.95 1.04  Sodium 135 - 145 mmol/L 136 137 136  Potassium 3.5 - 5.1 mmol/L 4.7 3.9 4.3  Chloride 98 - 111 mmol/L 103 105 106  CO2 22 - 32  mmol/L 17(L) 17(L) 18(L)  Calcium 8.9 - 10.3 mg/dL 9.3 9.1 9.2  Total Protein 6.5 - 8.1 g/dL 7.8 8.0 8.0  Total Bilirubin 0.3 - 1.2 mg/dL 0.3 0.3 0.3  Alkaline Phos 38 - 126 U/L 69 78 89  AST 15 - 41 U/L 6(L) 7(L) 7(L)  ALT 0 - 44 U/L 8 10 11  RADIOGRAPHIC STUDIES: I have personally reviewed the radiological images as listed and agreed with the findings in the report. No results found.   ASSESSMENT & PLAN:  Edward Todd is a 43 y.o. male with   1. 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 Work upconfirmed multiple myeloma, unfortunately he has multiple compression fracture of thoracic and lumbar spine from this.  -I started him oninduction chemo with QQI:WLNLGX Velcade injections with Dexamethasonestartedon 09/05/18 and oral Revlimid on 09/14/18. -His cytogeneticsand FISHresults showed trisomy 11, FISH panel otherwise negative, this is considered a standard risk.  -He was on first line chemo VRD, but due to poor tolerance toRevlimid, I stopped it in August 2020. -He met with Dr. Norma Fredrickson who plans to move forward with bone marrow transplant after about 2 monthsof Dara/Pomalyst.  -Irecommended port placement, he declined -He is currently on second-line weekly Faspo injection, Dexa and Pomalyst 29m 3 weeks on/1 week off. He continues Zometa monthly  -He has been tolerating dose adjustment and injections well. His last Pomalyst was taken 02/28/19, but can not receive refill given there needs to be 0 chance of pregnancy from partner. His partner is on birth control and they have had appropriate use of condom contraception per patient. We will re-enroll him for Pomalyst so he can start next cycle.  -Labs reviewed, ANC 0.8. Overall adequate to proceed with Dara injection today. MM has been trending down well but not near 0 yet. He will f/u with Dr. RNorma Fredricksonsoon to discuss when to proceed  with Transplant process.  -Continue 234mPomalyst on for 3 weeks, off 1 week, Dara injections weekly. Dexa 34m93mrally day before infusion. Continue Zometa every 4 weeks. He will continue Acyclovir.  -due to the fact that he missed two weekly doses of Dara, I will add Day 29 to this cycle 2. If he does not go on BMT after cycle 2, then will change to every 2 weeks from cycle 3  -he is off Pomalyst this week, my nurse will get him re-registered and refill his Pomalyst for him  -F/u in next week.   2. Anemia, secondary to MM -He required blood transfusion 09/12/18. -On Oral iron.  -Has improved since start of treatment. Continue to monitor. Stable lately.   3. Severe low back pain, Headaches -Back pain secondary to thoracic and lumbar compression fractures,lumbar spine stenosisseen on 08/31/18 bone survey.  -He has completed rehab and currently doing PT. His peripheral strength and ambulationcontinues toimprove. -He previously stopped Oxycodone due to fear of Hallucinations. He is on Robaxin 750m52md hydrocodone BID for back pain and Tylenol 1000mg534mours for HA.  -Back pain is improving and manageable. He is able to ambulate with cane now. Will start to ween his hydrocodone down, will reduce Hydrocodone to Half tablet BID (03/01/19).   4. DM, Type II, hyperglycemia -On Metformin BID and sliding scale insulin.  -He is being seen by Dr. EllisLoanne Drillinghas sugar monitor placed on right arm. He is currently on no carb diet and will continue medications.  -Reduced oral dexa to 34mg d20mbefore infusion and 10mg o13my as pre-meds in clinic.  -He will continue calcium BID and Vitamin D 1000 units. -BG at 322 today (03/01/19). I instructed him to increase his insulin today.   5. Financial support -I offered [facility] that are available to him. -He doesnot haveinsurance and hehas applied fordisability.I gave him letter about his  disability for 1 year. -I recommend he see  advocate Annamary Rummage and billing department about help with his medical bills. -He was recently approved for Medicaid.   6. Stress, Hallucinations, headaches and nausea -Has not recurred with Pomalyst decrease, and holding Dexa and oxycodone. Will continue to monitor.    PLAN: -I refilled hydrocodone today, he will try to gradually wean it off  -Labs reviewed and adequate to proceed with Dara injection today and continue weekly for 2 more weeks to complete cycle 2  -will hold on Zometa today due to his pending tooth extraction  -ContinuePomalyst18m 3 weeks on/1 week off, last dose taken 02/28/19. Will restart on  11/13, my nurse is working on get him re-registered and fill his Pomalyst  -pt will take dexa 853mthe day before and day of Dara, and additional 1017ms premeds  -F/u in 2 weeks    No problem-specific Assessment & Plan notes found for this encounter.   No orders of the defined types were placed in this encounter.  All questions were answered. The patient knows to call the clinic with any problems, questions or concerns. No barriers to learning was detected. I spent 20 minutes counseling the patient face to face. The total time spent in the appointment was 25 minutes and more than 50% was on counseling and review of test results     YanTruitt MerleD 03/01/2019   I, AmoJoslyn Devonm acting as scribe for YanTruitt MerleD.   I have reviewed the above documentation for accuracy and completeness, and I agree with the above.

## 2019-03-01 ENCOUNTER — Inpatient Hospital Stay: Payer: Medicaid Other | Attending: Hematology

## 2019-03-01 ENCOUNTER — Other Ambulatory Visit: Payer: Self-pay

## 2019-03-01 ENCOUNTER — Telehealth: Payer: Self-pay

## 2019-03-01 ENCOUNTER — Encounter: Payer: Self-pay | Admitting: Hematology

## 2019-03-01 ENCOUNTER — Inpatient Hospital Stay: Payer: Medicaid Other

## 2019-03-01 ENCOUNTER — Inpatient Hospital Stay (HOSPITAL_BASED_OUTPATIENT_CLINIC_OR_DEPARTMENT_OTHER): Payer: Medicaid Other | Admitting: Hematology

## 2019-03-01 VITALS — BP 100/88 | HR 94 | Temp 98.3°F | Resp 18 | Ht 75.0 in | Wt 215.5 lb

## 2019-03-01 DIAGNOSIS — R519 Headache, unspecified: Secondary | ICD-10-CM | POA: Diagnosis not present

## 2019-03-01 DIAGNOSIS — Z5112 Encounter for antineoplastic immunotherapy: Secondary | ICD-10-CM | POA: Diagnosis not present

## 2019-03-01 DIAGNOSIS — C9000 Multiple myeloma not having achieved remission: Secondary | ICD-10-CM

## 2019-03-01 DIAGNOSIS — I10 Essential (primary) hypertension: Secondary | ICD-10-CM

## 2019-03-01 DIAGNOSIS — Z23 Encounter for immunization: Secondary | ICD-10-CM | POA: Diagnosis not present

## 2019-03-01 DIAGNOSIS — M545 Low back pain: Secondary | ICD-10-CM | POA: Insufficient documentation

## 2019-03-01 DIAGNOSIS — D630 Anemia in neoplastic disease: Secondary | ICD-10-CM | POA: Insufficient documentation

## 2019-03-01 DIAGNOSIS — Z9221 Personal history of antineoplastic chemotherapy: Secondary | ICD-10-CM | POA: Diagnosis not present

## 2019-03-01 DIAGNOSIS — E1165 Type 2 diabetes mellitus with hyperglycemia: Secondary | ICD-10-CM | POA: Insufficient documentation

## 2019-03-01 DIAGNOSIS — Z7982 Long term (current) use of aspirin: Secondary | ICD-10-CM | POA: Diagnosis not present

## 2019-03-01 DIAGNOSIS — R443 Hallucinations, unspecified: Secondary | ICD-10-CM | POA: Diagnosis not present

## 2019-03-01 DIAGNOSIS — E119 Type 2 diabetes mellitus without complications: Secondary | ICD-10-CM

## 2019-03-01 DIAGNOSIS — Z79899 Other long term (current) drug therapy: Secondary | ICD-10-CM | POA: Diagnosis not present

## 2019-03-01 DIAGNOSIS — Z794 Long term (current) use of insulin: Secondary | ICD-10-CM | POA: Diagnosis not present

## 2019-03-01 LAB — CBC WITH DIFFERENTIAL (CANCER CENTER ONLY)
Abs Immature Granulocytes: 0 10*3/uL (ref 0.00–0.07)
Basophils Absolute: 0 10*3/uL (ref 0.0–0.1)
Basophils Relative: 0 %
Eosinophils Absolute: 0 10*3/uL (ref 0.0–0.5)
Eosinophils Relative: 0 %
HCT: 32.1 % — ABNORMAL LOW (ref 39.0–52.0)
Hemoglobin: 10.8 g/dL — ABNORMAL LOW (ref 13.0–17.0)
Immature Granulocytes: 0 %
Lymphocytes Relative: 45 %
Lymphs Abs: 0.8 10*3/uL (ref 0.7–4.0)
MCH: 32 pg (ref 26.0–34.0)
MCHC: 33.6 g/dL (ref 30.0–36.0)
MCV: 95 fL (ref 80.0–100.0)
Monocytes Absolute: 0.2 10*3/uL (ref 0.1–1.0)
Monocytes Relative: 10 %
Neutro Abs: 0.8 10*3/uL — ABNORMAL LOW (ref 1.7–7.7)
Neutrophils Relative %: 45 %
Platelet Count: 236 10*3/uL (ref 150–400)
RBC: 3.38 MIL/uL — ABNORMAL LOW (ref 4.22–5.81)
RDW: 15.8 % — ABNORMAL HIGH (ref 11.5–15.5)
WBC Count: 1.8 10*3/uL — ABNORMAL LOW (ref 4.0–10.5)
nRBC: 0 % (ref 0.0–0.2)

## 2019-03-01 LAB — CMP (CANCER CENTER ONLY)
ALT: 8 U/L (ref 0–44)
AST: 6 U/L — ABNORMAL LOW (ref 15–41)
Albumin: 3.7 g/dL (ref 3.5–5.0)
Alkaline Phosphatase: 69 U/L (ref 38–126)
Anion gap: 16 — ABNORMAL HIGH (ref 5–15)
BUN: 15 mg/dL (ref 6–20)
CO2: 17 mmol/L — ABNORMAL LOW (ref 22–32)
Calcium: 9.3 mg/dL (ref 8.9–10.3)
Chloride: 103 mmol/L (ref 98–111)
Creatinine: 1.05 mg/dL (ref 0.61–1.24)
GFR, Est AFR Am: >60 mL/min
GFR, Estimated: >60 mL/min
Glucose, Bld: 322 mg/dL — ABNORMAL HIGH (ref 70–99)
Potassium: 4.7 mmol/L (ref 3.5–5.1)
Sodium: 136 mmol/L (ref 135–145)
Total Bilirubin: 0.3 mg/dL (ref 0.3–1.2)
Total Protein: 7.8 g/dL (ref 6.5–8.1)

## 2019-03-01 MED ORDER — ACETAMINOPHEN 325 MG PO TABS
ORAL_TABLET | ORAL | Status: AC
  Filled 2019-03-01: qty 2

## 2019-03-01 MED ORDER — DIPHENHYDRAMINE HCL 25 MG PO CAPS
ORAL_CAPSULE | ORAL | Status: AC
  Filled 2019-03-01: qty 2

## 2019-03-01 MED ORDER — MONTELUKAST SODIUM 10 MG PO TABS
ORAL_TABLET | ORAL | Status: AC
  Filled 2019-03-01: qty 1

## 2019-03-01 MED ORDER — ACETAMINOPHEN 325 MG PO TABS
650.0000 mg | ORAL_TABLET | Freq: Once | ORAL | Status: AC
  Administered 2019-03-01: 650 mg via ORAL

## 2019-03-01 MED ORDER — DEXAMETHASONE 4 MG PO TABS
10.0000 mg | ORAL_TABLET | Freq: Once | ORAL | Status: AC
  Administered 2019-03-01: 10 mg via ORAL

## 2019-03-01 MED ORDER — DIPHENHYDRAMINE HCL 25 MG PO CAPS
50.0000 mg | ORAL_CAPSULE | Freq: Once | ORAL | Status: AC
  Administered 2019-03-01: 12:00:00 50 mg via ORAL

## 2019-03-01 MED ORDER — HYDROCODONE-ACETAMINOPHEN 5-300 MG PO TABS: 0.5000 | ORAL_TABLET | Freq: Two times a day (BID) | ORAL | 0 refills | Status: DC | PRN

## 2019-03-01 MED ORDER — POMALIDOMIDE 2 MG PO CAPS: 2.0000 mg | ORAL_CAPSULE | Freq: Every day | ORAL | 0 refills | Status: DC

## 2019-03-01 MED ORDER — MONTELUKAST SODIUM 10 MG PO TABS
10.0000 mg | ORAL_TABLET | Freq: Once | ORAL | Status: AC
  Administered 2019-03-01: 10 mg via ORAL

## 2019-03-01 MED ORDER — DARATUMUMAB-HYALURONIDASE-FIHJ 1800-30000 MG-UT/15ML ~~LOC~~ SOLN
1800.0000 mg | Freq: Once | SUBCUTANEOUS | Status: AC
  Administered 2019-03-01: 1800 mg via SUBCUTANEOUS
  Filled 2019-03-01: qty 15

## 2019-03-01 MED ORDER — DEXAMETHASONE 4 MG PO TABS
ORAL_TABLET | ORAL | Status: AC
  Filled 2019-03-01: qty 3

## 2019-03-01 NOTE — Patient Instructions (Signed)
[facility] Discharge Instructions for Patients Receiving Chemotherapy  Today you received the following chemotherapy agents: Darzalex Faspro  To help prevent nausea and vomiting after your treatment, we encourage you to take your nausea medication as directed.    If you develop nausea and vomiting that is not controlled by your nausea medication, call the clinic.   BELOW ARE SYMPTOMS THAT SHOULD BE REPORTED IMMEDIATELY:  *FEVER GREATER THAN 100.5 F  *CHILLS WITH OR WITHOUT FEVER  NAUSEA AND VOMITING THAT IS NOT CONTROLLED WITH YOUR NAUSEA MEDICATION  *UNUSUAL SHORTNESS OF BREATH  *UNUSUAL BRUISING OR BLEEDING  TENDERNESS IN MOUTH AND THROAT WITH OR WITHOUT PRESENCE OF ULCERS  *URINARY PROBLEMS  *BOWEL PROBLEMS  UNUSUAL RASH Items with * indicate a potential emergency and should be followed up as soon as possible.  Feel free to call the clinic should you have any questions or concerns. The clinic phone number is [phone].  Please show the CHEMO ALERT CARD at check-in to the Emergency Department and triage nurse.   

## 2019-03-01 NOTE — Telephone Encounter (Signed)
Faxed new script and [employer] authorization [id number] to [employer] at fax [phone] after receiving notification from [employer] that patient has been re-enrolled. Notified patient okay to take Pomalyst.

## 2019-03-01 NOTE — Progress Notes (Signed)
No Zometa today per MD Burr Medico until 1 month after dental procedure

## 2019-03-01 NOTE — Telephone Encounter (Signed)
Called [employer] back with needed information after Pomalyst enrollment was deactivated due to a condom breaking during intercourse.  Date of occurrence was 02/25/2019, 1st day of LMP 02/26/2019, Birth Control is [facility], has been on for one year has not missed any doses.  Partner's name is SD DOB 08/09/1969.  No pregnancy test has been done has no reason to believe pregnant.  Next GYN visit is 04/16/2019

## 2019-03-01 NOTE — Progress Notes (Signed)
Ok to treat with anc 0.8 per dr. Burr Medico.

## 2019-03-05 ENCOUNTER — Other Ambulatory Visit: Payer: Self-pay | Admitting: Hematology

## 2019-03-05 DIAGNOSIS — C9000 Multiple myeloma not having achieved remission: Secondary | ICD-10-CM

## 2019-03-08 ENCOUNTER — Ambulatory Visit: Payer: Medicaid Other | Admitting: Hematology

## 2019-03-08 ENCOUNTER — Other Ambulatory Visit: Payer: Self-pay

## 2019-03-08 ENCOUNTER — Inpatient Hospital Stay: Payer: Medicaid Other

## 2019-03-08 ENCOUNTER — Other Ambulatory Visit: Payer: Self-pay | Admitting: Hematology

## 2019-03-08 VITALS — BP 136/91 | HR 101 | Temp 98.0°F | Resp 16 | Ht 75.0 in | Wt 213.5 lb

## 2019-03-08 DIAGNOSIS — C9000 Multiple myeloma not having achieved remission: Secondary | ICD-10-CM

## 2019-03-08 DIAGNOSIS — Z5112 Encounter for antineoplastic immunotherapy: Secondary | ICD-10-CM | POA: Diagnosis not present

## 2019-03-08 LAB — CBC WITH DIFFERENTIAL (CANCER CENTER ONLY)
Abs Immature Granulocytes: 0.02 10*3/uL (ref 0.00–0.07)
Basophils Absolute: 0 10*3/uL (ref 0.0–0.1)
Basophils Relative: 0 %
Eosinophils Absolute: 0 10*3/uL (ref 0.0–0.5)
Eosinophils Relative: 0 %
HCT: 30.9 % — ABNORMAL LOW (ref 39.0–52.0)
Hemoglobin: 10.6 g/dL — ABNORMAL LOW (ref 13.0–17.0)
Immature Granulocytes: 1 %
Lymphocytes Relative: 34 %
Lymphs Abs: 1 10*3/uL (ref 0.7–4.0)
MCH: 31.7 pg (ref 26.0–34.0)
MCHC: 34.3 g/dL (ref 30.0–36.0)
MCV: 92.5 fL (ref 80.0–100.0)
Monocytes Absolute: 0.4 10*3/uL (ref 0.1–1.0)
Monocytes Relative: 13 %
Neutro Abs: 1.6 10*3/uL — ABNORMAL LOW (ref 1.7–7.7)
Neutrophils Relative %: 52 %
Platelet Count: 335 10*3/uL (ref 150–400)
RBC: 3.34 MIL/uL — ABNORMAL LOW (ref 4.22–5.81)
RDW: 15 % (ref 11.5–15.5)
WBC Count: 2.9 10*3/uL — ABNORMAL LOW (ref 4.0–10.5)
nRBC: 0 % (ref 0.0–0.2)

## 2019-03-08 LAB — CMP (CANCER CENTER ONLY)
ALT: 8 U/L (ref 0–44)
AST: 6 U/L — ABNORMAL LOW (ref 15–41)
Albumin: 3.6 g/dL (ref 3.5–5.0)
Alkaline Phosphatase: 77 U/L (ref 38–126)
Anion gap: 15 (ref 5–15)
BUN: 14 mg/dL (ref 6–20)
CO2: 18 mmol/L — ABNORMAL LOW (ref 22–32)
Calcium: 9.1 mg/dL (ref 8.9–10.3)
Chloride: 105 mmol/L (ref 98–111)
Creatinine: 0.89 mg/dL (ref 0.61–1.24)
GFR, Est AFR Am: >60 mL/min (ref >60)
GFR, Estimated: >60 mL/min (ref >60)
Glucose, Bld: 232 mg/dL — ABNORMAL HIGH (ref 70–99)
Potassium: 3.9 mmol/L (ref 3.5–5.1)
Sodium: 138 mmol/L (ref 135–145)
Total Bilirubin: 0.3 mg/dL (ref 0.3–1.2)
Total Protein: 8.1 g/dL (ref 6.5–8.1)

## 2019-03-08 MED ORDER — ACETAMINOPHEN 325 MG PO TABS
650.0000 mg | ORAL_TABLET | Freq: Once | ORAL | Status: AC
  Administered 2019-03-08: 650 mg via ORAL

## 2019-03-08 MED ORDER — DIPHENHYDRAMINE HCL 25 MG PO CAPS
50.0000 mg | ORAL_CAPSULE | Freq: Once | ORAL | Status: AC
  Administered 2019-03-08: 50 mg via ORAL

## 2019-03-08 MED ORDER — MONTELUKAST SODIUM 10 MG PO TABS
10.0000 mg | ORAL_TABLET | Freq: Once | ORAL | Status: AC
  Administered 2019-03-08: 10 mg via ORAL

## 2019-03-08 MED ORDER — MONTELUKAST SODIUM 10 MG PO TABS
ORAL_TABLET | ORAL | Status: AC
  Filled 2019-03-08: qty 1

## 2019-03-08 MED ORDER — DEXAMETHASONE 4 MG PO TABS
10.0000 mg | ORAL_TABLET | Freq: Once | ORAL | Status: AC
  Administered 2019-03-08: 10 mg via ORAL

## 2019-03-08 MED ORDER — SODIUM CHLORIDE 0.9 % IV SOLN
Freq: Once | INTRAVENOUS | Status: DC
  Filled 2019-03-08: qty 250

## 2019-03-08 MED ORDER — DEXAMETHASONE 4 MG PO TABS
ORAL_TABLET | ORAL | Status: AC
  Filled 2019-03-08: qty 3

## 2019-03-08 MED ORDER — ACETAMINOPHEN 325 MG PO TABS
ORAL_TABLET | ORAL | Status: AC
  Filled 2019-03-08: qty 2

## 2019-03-08 MED ORDER — DARATUMUMAB-HYALURONIDASE-FIHJ 1800-30000 MG-UT/15ML ~~LOC~~ SOLN
1800.0000 mg | Freq: Once | SUBCUTANEOUS | Status: AC
  Administered 2019-03-08: 1800 mg via SUBCUTANEOUS
  Filled 2019-03-08: qty 15

## 2019-03-08 MED ORDER — DIPHENHYDRAMINE HCL 25 MG PO CAPS
ORAL_CAPSULE | ORAL | Status: AC
  Filled 2019-03-08: qty 2

## 2019-03-08 NOTE — Patient Instructions (Signed)
[facility] Discharge Instructions for Patients Receiving Chemotherapy  Today you received the following chemotherapy agents :  Daratumumab.  To help prevent nausea and vomiting after your treatment, we encourage you to take your nausea medication as prescribed.   If you develop nausea and vomiting that is not controlled by your nausea medication, call the clinic.   BELOW ARE SYMPTOMS THAT SHOULD BE REPORTED IMMEDIATELY:  *FEVER GREATER THAN 100.5 F  *CHILLS WITH OR WITHOUT FEVER  NAUSEA AND VOMITING THAT IS NOT CONTROLLED WITH YOUR NAUSEA MEDICATION  *UNUSUAL SHORTNESS OF BREATH  *UNUSUAL BRUISING OR BLEEDING  TENDERNESS IN MOUTH AND THROAT WITH OR WITHOUT PRESENCE OF ULCERS  *URINARY PROBLEMS  *BOWEL PROBLEMS  UNUSUAL RASH Items with * indicate a potential emergency and should be followed up as soon as possible.  Feel free to call the clinic should you have any questions or concerns. The clinic phone number is [phone].  Please show the CHEMO ALERT CARD at check-in to the Emergency Department and triage nurse.   

## 2019-03-08 NOTE — Progress Notes (Signed)
Per Dr Burr Medico OK for treatment today with HR >100

## 2019-03-11 ENCOUNTER — Telehealth: Payer: Self-pay

## 2019-03-11 NOTE — Telephone Encounter (Signed)
Patient calls stating he has ran out of Hydrocodone and needs a refill. Last filled 11/6 #30.

## 2019-03-12 ENCOUNTER — Other Ambulatory Visit: Payer: Self-pay | Admitting: Nurse Practitioner

## 2019-03-12 MED ORDER — HYDROCODONE-ACETAMINOPHEN 5-300 MG PO TABS: 0.5000 | ORAL_TABLET | Freq: Two times a day (BID) | ORAL | 0 refills | Status: DC | PRN

## 2019-03-14 NOTE — Progress Notes (Signed)
Edward Todd   [phone] [phone]   Clinic Follow up Note   Patient Care Team: London Pepper, MD as PCP - General (Family Medicine)  Date of Service:  03/15/2019  CHIEF COMPLAINT: F/u of MM  SUMMARY OF ONCOLOGIC HISTORY: Oncology History Overview Note  Cancer Staging Multiple myeloma ([place]) Staging form: Plasma Cell Myeloma and Plasma Cell Disorders, AJCC 8th Edition - Clinical stage from 09/04/2018: RISS Stage III (Beta-2-microglobulin (mg/L): 5.5, Albumin (g/dL): 2.6, ISS: Stage III, High-risk cytogenetics: Absent, LDH: Elevated) - Signed by Truitt Merle, MD on 09/26/2018     Multiple myeloma ([place])  08/31/2018 Initial Diagnosis   Multiple myeloma ([place])   09/04/2018 Cancer Staging   Staging form: Plasma Cell Myeloma and Plasma Cell Disorders, AJCC 8th Edition - Clinical stage from 09/04/2018: RISS Stage III (Beta-2-microglobulin (mg/L): 5.5, Albumin (g/dL): 2.6, ISS: Stage III, High-risk cytogenetics: Absent, LDH: Elevated) - Signed by Truitt Merle, MD on 09/26/2018   09/04/2018 Initial Biopsy   Diagnosis 09/04/18 Bone Marrow, Aspirate,Biopsy, and Clot, left posterior iliac crest BONE MARROW: - CELLULAR MARROW WITH INVOLVEMENT BY PLASMA CELL NEOPLASM (>95%) - SEE COMMENT PERIPHERAL BLOOD: - NORMOCYTIC ANEMIA - ROULEAUX FORMATION - SEE COMPLETE BLOOD COUNT   09/05/2018 - 12/28/2018 Chemotherapy   Weekly velcade injection and dexamethasone 51m starting 09/05/18             -increase Velcade injection to twice a week (Fridays/Mondays) for 2 weeks on, 1 week off starting 10/19/18. Will return to weekly Velcade starting 11/09/18.              -Dexa stopped 12/14/18 due to hyperglycemia. Restart on 9/4 at 270mWeekly.              -Stop Velcade and change Dexa after 12/28/18.    09/14/2018 - 12/21/2018 Chemotherapy   Revlimid 2573m weeks on/1 week off starting 09/14/18. Stopped 12/21/18 due to abdominal pain.     12/29/2018 -  Chemotherapy   Pomalyst 4mg29mweeks  on/ 1 week off starting 12/29/18.   -Due to neutropenia, possible related hallucination Pomalyst was reduced to 2mg 36mrting 02/06/19   01/04/2019 -  Chemotherapy   IV weekly Daratumumab and oral Dexa 8mg d34mbefore infusion starting 01/04/19             -changed Dara infusion to Faspro injection starting 02/08/19      PREVIOUS AND CURRENT THERAPY:  1. Weekly velcade injection and dexamethasone 40mg s65ming 09/05/18,Revlimid 25mgdai7mweeks onand 1 week off starting 09/14/18.  -increase Velcade injection to twice a week (Fridays/Mondays) for 2 weeks on, 1 week off starting 10/19/18. Will return to weekly Velcade starting 11/09/18 due to poor tolerance. -Dexa stopped 12/14/18 due to hyperglycemia. Will restart on 9/4 at 20mg wee44m  -Due to Abdominal pain, Revlimid stopped 12/21/18. -Stop Velcade and change Dexa after 12/28/18. 2. MonthlyZometastarted on 09/05/2018 3. IV weekly Daratumumab and oral Dexa 8mg day b30mre and day of infusion starting 01/04/19, Pomalyst 4mg 3 week7mn/ 1 week off starting 12/29/18. -Due to neutropenia, possible related hallucination Pomalyst was reduced to 2mg startin31m0/14/20 and Dara held for 2 doses -changed Dara infusion to Faspro injection starting 02/08/19, completed weekly C1 and C2 on 03/15/19. Start C3 every 2 weeks on 03/22/19.   INTERVAL HISTORY:  Edward Todd a follow up of MM. He presents to the clinic alone. He notes he restarted blood counts last week. He notes he is doing well and continues to  tolerate treatment. He notes his back pain is about twice a week and sore after with PT days to exercise. He still takes Hydrocodone 1 tablet daily. He needs a refill. He plans to see Dr Norma Fredrickson on 11/24.    REVIEW OF SYSTEMS:   Constitutional: Denies fevers, chills or abnormal weight loss Eyes: Denies blurriness of vision Ears, nose, mouth, throat, and face: Denies  mucositis or sore throat Respiratory: Denies cough, dyspnea or wheezes Cardiovascular: Denies palpitation, chest discomfort or lower extremity swelling Gastrointestinal:  Denies nausea, heartburn or change in bowel habits MSK: (+) back soreness intermittent  Skin: Denies abnormal skin rashes Lymphatics: Denies new lymphadenopathy or easy bruising Neurological:Denies numbness, tingling or new weaknesses Behavioral/Psych: Mood is stable, no new changes  All other systems were reviewed with the patient and are negative.  MEDICAL HISTORY:  Past Medical History:  Diagnosis Date  . Cancer ([place])   . Diabetes mellitus without complication ([place])   . Hypertension     SURGICAL HISTORY: History reviewed. No pertinent surgical history.  I have reviewed the social history and family history with the patient and they are unchanged from previous note.  ALLERGIES:  is allergic to shellfish allergy.  MEDICATIONS:  Current Outpatient Medications  Medication Sig Dispense Refill  . acetaminophen (TYLENOL) 500 MG tablet Take 500 mg by mouth every 6 (six) hours as needed for mild pain.    Marland Kitchen acyclovir (ZOVIRAX) 400 MG tablet TAKE 1 TABLET BY MOUTH TWICE A DAY 60 tablet 0  . aspirin 81 MG chewable tablet Chew 1 tablet (81 mg total) by mouth daily.    . calcium-vitamin D (OSCAL WITH D) 500-200 MG-UNIT tablet Take 1 tablet by mouth 2 (two) times daily. 60 tablet 1  . Continuous Blood Gluc Receiver (FREESTYLE LIBRE 14 DAY READER) DEVI See admin instructions.    . Continuous Blood Gluc Sensor (FREESTYLE LIBRE 14 DAY SENSOR) MISC APPLY EVERY 14 DAYS    . dexamethasone (DECADRON) 4 MG tablet Take 5 tablets (20 mg total) by mouth every Friday. 20 tablet 2  . HUMALOG KWIKPEN 100 UNIT/ML KwikPen Inject 0-10 Units into the skin daily as needed (blood sugar).     Marland Kitchen HYDROcodone-Acetaminophen 5-300 MG TABS Take 0.5-1 tablets by mouth every 12 (twelve) hours as needed. 30 tablet 0  . Melatonin 3 MG CAPS Take 1  capsule (3 mg total) by mouth at bedtime as needed (sleep). 30 capsule 0  . metFORMIN (GLUCOPHAGE) 1000 MG tablet Take 1 tablet (1,000 mg total) by mouth 2 (two) times daily with a meal. 180 tablet 1  . methocarbamol (ROBAXIN) 750 MG tablet Take 1 tablet (750 mg total) by mouth 3 (three) times daily as needed for muscle spasms. 90 tablet 1  . metoCLOPramide (REGLAN) 10 MG tablet Take 1 tablet (10 mg total) by mouth every 8 (eight) hours as needed for nausea. 20 tablet 0  . ondansetron (ZOFRAN-ODT) 4 MG disintegrating tablet Take 1 tablet (4 mg total) by mouth every 6 (six) hours as needed for nausea or vomiting. 20 tablet 0  . ONETOUCH VERIO test strip USE TO CHECK BLOOD SUGAR 3 TIMES A DAY    . pantoprazole (PROTONIX) 40 MG tablet TAKE 1 TABLET (40 MG TOTAL) BY MOUTH DAILY AT 6 (SIX) AM. 90 tablet 1  . polyethylene glycol (MIRALAX / GLYCOLAX) 17 g packet Take 17 g by mouth daily. (Patient taking differently: Take 17 g by mouth daily as needed for mild constipation. ) 14 each 0  .  pomalidomide (POMALYST) 2 MG capsule Take 1 capsule (2 mg total) by mouth daily. 21 capsule 0  . potassium chloride (KLOR-CON) 20 MEQ packet Take 20 mEq by mouth daily. 30 packet 1  . senna-docusate (SENOKOT-S) 8.6-50 MG tablet Take 1 tablet by mouth 2 (two) times daily. (Patient taking differently: Take 1 tablet by mouth 2 (two) times daily as needed for mild constipation. )     No current facility-administered medications for this visit.     PHYSICAL EXAMINATION: ECOG PERFORMANCE STATUS: 2 - Symptomatic, <50% confined to bed  There were no vitals filed for this visit. There were no vitals filed for this visit.  GENERAL:alert, no distress and comfortable SKIN: skin color, texture, turgor are normal, no rashes or significant lesions EYES: normal, Conjunctiva are pink and non-injected, sclera clear  NECK: supple, thyroid normal size, non-tender, without nodularity LYMPH:  no palpable lymphadenopathy in the cervical,  axillary  LUNGS: clear to auscultation and percussion with normal breathing effort HEART: regular rate & rhythm and no murmurs and no lower extremity edema ABDOMEN:abdomen soft, non-tender and normal bowel sounds Musculoskeletal:no cyanosis of digits and no clubbing  NEURO: alert & oriented x 3 with fluent speech, no focal motor/sensory deficits  LABORATORY DATA:  I have reviewed the data as listed CBC Latest Ref Rng & Units 03/15/2019 03/08/2019 03/01/2019  WBC 4.0 - 10.5 K/uL 5.6 2.9(L) 1.8(L)  Hemoglobin 13.0 - 17.0 g/dL 11.7(L) 10.6(L) 10.8(L)  Hematocrit 39.0 - 52.0 % 34.9(L) 30.9(L) 32.1(L)  Platelets 150 - 400 K/uL 433(H) 335 236     CMP Latest Ref Rng & Units 03/15/2019 03/08/2019 03/01/2019  Glucose 70 - 99 mg/dL 186(H) 232(H) 322(H)  BUN 6 - 20 mg/dL _0 Creatinine 0.61 - 1.24 mg/dL 0.86 0.89 1.05  Sodium 135 - 145 mmol/L 139 138 136  Potassium 3.5 - 5.1 mmol/L 4.1 3.9 4.7  Chloride 98 - 111 mmol/L 104 105 103  CO2 22 - 32 mmol/L 23 18(L) 17(L)  Calcium 8.9 - 10.3 mg/dL 9.2 9.1 9.3  Total Protein 6.5 - 8.1 g/dL 8.4(H) 8.1 7.8  Total Bilirubin 0.3 - 1.2 mg/dL 0.4 0.3 0.3  Alkaline Phos 38 - 126 U/L 85 77 69  AST 15 - 41 U/L 10(L) 6(L) 6(L)  ALT 0 - 44 U/L _1 RADIOGRAPHIC STUDIES: I have personally reviewed the radiological images as listed and agreed with the findings in the report. No results found.   ASSESSMENT & PLAN:  Edward Todd is a 43 y.o. male with    1. 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 Work upconfirmed multiple myeloma, unfortunately he has multiple compression fracture of thoracic and lumbar spine from this.  -I started him oninduction chemo with VVO:HYWVPX Velcade injections with Dexamethasonestartedon 09/05/18 and oral Revlimid on 09/14/18. -His cytogeneticsand FISHresults showed trisomy 11, FISH panel otherwise negative, this is  considered a standard risk.  -Hewason first line chemo VRD, but due to poor tolerance toRevlimid, I stopped it in August 2020. -He met with Dr. Norma Fredrickson who plans to move forward with bone marrow transplant after about 2 monthsof Dara/Pomalyst.  -Irecommended port placement, he declined -He is currently on second-line weekly Faspo injection, Dexa and Pomalyst 57m 3 weeks on/1 week off. He continues Zometa monthly, but will hold it for now due to pending tooth extraction.  -He tolerated cycle 1 and 2 current regimen and  doses well. Labs reviewed, cytopenias have improved. Overall adequate to proceed with Dara injection today.  -M-protein has been trending down well but still detectable. He will f/u with Dr. Norma Fredrickson on 11/24 to discuss when to proceed with Transplant process. If he does not proceed with Transplant soon will proceed with Cycle 3 next week.  -Continue42m Pomalyst on for 3 weeks, off 1 week, Cycle 3 Dara injections every 2 weeks starting 03/22/19. Dexa 887morally day before infusion. Continue Zometa every 4 weeks, holding for now until teeth extraction. He will continue Acyclovir. -f/u in 1 week  2. Anemia, secondary to MM -He required blood transfusion 09/12/18. -On Oral iron.  -Has improved since start of treatment. Continue to monitor.   3. Severe low back pain, Headaches -Back pain secondary to thoracic and lumbar compression fractures,lumbar spine stenosisseen on 08/31/18 bone survey.  -He has completed rehab and currently doing PT. His peripheral strength and ambulationcontinues toimprove. -He previously stopped Oxycodone due to fear of Hallucinations. He is on Robaxin 75073mnd hydrocodone half tablet BID for back pain and Tylenol 1000m74mhours for HA.  -Back pain is improving and mostly soreness with activity. He is able to ambulate with cane now. Will continue to ween his hydrocodone down. I will refill Hydrocodone today (03/15/19) at same half tablet dose  1-2 times a day.   4. DM, Type II, hyperglycemia -On Metformin BID and sliding scale insulin.  -He is being seen by Dr. ElliLoanne Drilling has sugar monitor placed on right arm. He is currently on no carb diet and will continue medications.  -Reduced oral dexa to 8mg 56m before infusion and 10mg 81mly as pre-meds in clinic.  -He will continue calcium BID and Vitamin D 1000 units.  5. Financial support -I offered [facility] that are available to him. -He doesnot haveinsurance and hehas applied fordisability.I gave him letter about his disability for 1 year. -I recommend he see advocate LeniseAnnamary Rummageilling department about help with his medical bills. -He was recently approved for Medicaid.   6. Stress, Hallucinations, headaches and nausea -Has not recurred with Pomalyst decrease and restart of Dexa and holding oxycodone. Will continue to monitor.   PLAN: -I refilled hydrocodone on 11/17, will check with his pharmacy make sure he can refill. He agrees to gradually wean it off  -Labs reviewed and adequate to proceed with Darainjectiontoday to complete cycle 2.  -will hold on Zometa due to his pending tooth extraction  -ContinuePomalyst2mg 3 55mks on/1 week off, Current cycle started on 11/13 -pt will take dexa 8mg the49my before and additional 10mg as 61medsof dara  -Lab, f/u and Dara injection in 1 week and continue every 2 weeks     No problem-specific Assessment & Plan notes found for this encounter.   No orders of the defined types were placed in this encounter.  All questions were answered. The patient knows to call the clinic with any problems, questions or concerns. No barriers to learning was detected. I spent 20 minutes counseling the patient face to face. The total time spent in the appointment was 25 minutes and more than 50% was on counseling and review of test results     Alta Shober,Truitt Merle0/2020   I, Amoya BenJoslyn Devonng as  scribe for Azilee Pirro,Truitt Merle have reviewed the above documentation for accuracy and completeness, and I agree with the above.

## 2019-03-15 ENCOUNTER — Inpatient Hospital Stay: Payer: Medicaid Other

## 2019-03-15 ENCOUNTER — Inpatient Hospital Stay (HOSPITAL_BASED_OUTPATIENT_CLINIC_OR_DEPARTMENT_OTHER): Payer: Medicaid Other | Admitting: Hematology

## 2019-03-15 ENCOUNTER — Encounter: Payer: Self-pay | Admitting: Hematology

## 2019-03-15 ENCOUNTER — Other Ambulatory Visit: Payer: Self-pay

## 2019-03-15 VITALS — BP 122/90 | HR 79 | Temp 98.6°F | Resp 20 | Ht 75.0 in | Wt 218.0 lb

## 2019-03-15 DIAGNOSIS — C9000 Multiple myeloma not having achieved remission: Secondary | ICD-10-CM

## 2019-03-15 DIAGNOSIS — E119 Type 2 diabetes mellitus without complications: Secondary | ICD-10-CM | POA: Diagnosis not present

## 2019-03-15 DIAGNOSIS — I10 Essential (primary) hypertension: Secondary | ICD-10-CM

## 2019-03-15 DIAGNOSIS — Z5112 Encounter for antineoplastic immunotherapy: Secondary | ICD-10-CM | POA: Diagnosis not present

## 2019-03-15 LAB — CMP (CANCER CENTER ONLY)
ALT: 11 U/L (ref 0–44)
AST: 10 U/L — ABNORMAL LOW (ref 15–41)
Albumin: 4 g/dL (ref 3.5–5.0)
Alkaline Phosphatase: 85 U/L (ref 38–126)
Anion gap: 12 (ref 5–15)
BUN: 13 mg/dL (ref 6–20)
CO2: 23 mmol/L (ref 22–32)
Calcium: 9.2 mg/dL (ref 8.9–10.3)
Chloride: 104 mmol/L (ref 98–111)
Creatinine: 0.86 mg/dL (ref 0.61–1.24)
GFR, Est AFR Am: >60 mL/min (ref >60)
GFR, Estimated: >60 mL/min (ref >60)
Glucose, Bld: 186 mg/dL — ABNORMAL HIGH (ref 70–99)
Potassium: 4.1 mmol/L (ref 3.5–5.1)
Sodium: 139 mmol/L (ref 135–145)
Total Bilirubin: 0.4 mg/dL (ref 0.3–1.2)
Total Protein: 8.4 g/dL — ABNORMAL HIGH (ref 6.5–8.1)

## 2019-03-15 LAB — CBC WITH DIFFERENTIAL (CANCER CENTER ONLY)
Abs Immature Granulocytes: 0.02 10*3/uL (ref 0.00–0.07)
Basophils Absolute: 0 10*3/uL (ref 0.0–0.1)
Basophils Relative: 0 %
Eosinophils Absolute: 0 10*3/uL (ref 0.0–0.5)
Eosinophils Relative: 0 %
HCT: 34.9 % — ABNORMAL LOW (ref 39.0–52.0)
Hemoglobin: 11.7 g/dL — ABNORMAL LOW (ref 13.0–17.0)
Immature Granulocytes: 0 %
Lymphocytes Relative: 15 %
Lymphs Abs: 0.9 10*3/uL (ref 0.7–4.0)
MCH: 31.8 pg (ref 26.0–34.0)
MCHC: 33.5 g/dL (ref 30.0–36.0)
MCV: 94.8 fL (ref 80.0–100.0)
Monocytes Absolute: 0.1 10*3/uL (ref 0.1–1.0)
Monocytes Relative: 2 %
Neutro Abs: 4.6 10*3/uL (ref 1.7–7.7)
Neutrophils Relative %: 83 %
Platelet Count: 433 10*3/uL — ABNORMAL HIGH (ref 150–400)
RBC: 3.68 MIL/uL — ABNORMAL LOW (ref 4.22–5.81)
RDW: 15.2 % (ref 11.5–15.5)
WBC Count: 5.6 10*3/uL (ref 4.0–10.5)
nRBC: 0 % (ref 0.0–0.2)

## 2019-03-15 MED ORDER — MONTELUKAST SODIUM 10 MG PO TABS
10.0000 mg | ORAL_TABLET | Freq: Once | ORAL | Status: AC
  Administered 2019-03-15: 10 mg via ORAL

## 2019-03-15 MED ORDER — INFLUENZA VAC SPLIT QUAD 0.5 ML IM SUSY
0.5000 mL | PREFILLED_SYRINGE | Freq: Once | INTRAMUSCULAR | Status: AC
  Administered 2019-03-15: 0.5 mL via INTRAMUSCULAR

## 2019-03-15 MED ORDER — ACETAMINOPHEN 325 MG PO TABS
ORAL_TABLET | ORAL | Status: AC
  Filled 2019-03-15: qty 2

## 2019-03-15 MED ORDER — DIPHENHYDRAMINE HCL 25 MG PO CAPS
ORAL_CAPSULE | ORAL | Status: AC
  Filled 2019-03-15: qty 2

## 2019-03-15 MED ORDER — ACETAMINOPHEN 325 MG PO TABS
650.0000 mg | ORAL_TABLET | Freq: Once | ORAL | Status: AC
  Administered 2019-03-15: 650 mg via ORAL

## 2019-03-15 MED ORDER — DIPHENHYDRAMINE HCL 25 MG PO CAPS
50.0000 mg | ORAL_CAPSULE | Freq: Once | ORAL | Status: AC
  Administered 2019-03-15: 50 mg via ORAL

## 2019-03-15 MED ORDER — DARATUMUMAB-HYALURONIDASE-FIHJ 1800-30000 MG-UT/15ML ~~LOC~~ SOLN
1800.0000 mg | Freq: Once | SUBCUTANEOUS | Status: AC
  Administered 2019-03-15: 1800 mg via SUBCUTANEOUS
  Filled 2019-03-15: qty 15

## 2019-03-15 MED ORDER — DEXAMETHASONE 4 MG PO TABS
ORAL_TABLET | ORAL | Status: AC
  Filled 2019-03-15: qty 3

## 2019-03-15 MED ORDER — DEXAMETHASONE 4 MG PO TABS
10.0000 mg | ORAL_TABLET | Freq: Once | ORAL | Status: AC
  Administered 2019-03-15: 10 mg via ORAL

## 2019-03-15 MED ORDER — MONTELUKAST SODIUM 10 MG PO TABS
ORAL_TABLET | ORAL | Status: AC
  Filled 2019-03-15: qty 1

## 2019-03-15 MED ORDER — INFLUENZA VAC SPLIT QUAD 0.5 ML IM SUSY
PREFILLED_SYRINGE | INTRAMUSCULAR | Status: AC
  Filled 2019-03-15: qty 0.5

## 2019-03-15 NOTE — Progress Notes (Signed)
11:35 Pt refused 1 hour observation time after Darzalex Faspro. VSS at discharge

## 2019-03-15 NOTE — Patient Instructions (Signed)
[facility] Discharge Instructions for Patients Receiving Chemotherapy  Today you received the following chemotherapy agents:  Daratumumab  To help prevent nausea and vomiting after your treatment, we encourage you to take your nausea medication as prescribed.   If you develop nausea and vomiting that is not controlled by your nausea medication, call the clinic.   BELOW ARE SYMPTOMS THAT SHOULD BE REPORTED IMMEDIATELY:  *FEVER GREATER THAN 100.5 F  *CHILLS WITH OR WITHOUT FEVER  NAUSEA AND VOMITING THAT IS NOT CONTROLLED WITH YOUR NAUSEA MEDICATION  *UNUSUAL SHORTNESS OF BREATH  *UNUSUAL BRUISING OR BLEEDING  TENDERNESS IN MOUTH AND THROAT WITH OR WITHOUT PRESENCE OF ULCERS  *URINARY PROBLEMS  *BOWEL PROBLEMS  UNUSUAL RASH Items with * indicate a potential emergency and should be followed up as soon as possible.  Feel free to call the clinic should you have any questions or concerns. The clinic phone number is [phone].  Please show the [facility] at check-in to the Emergency Department and triage nurse.   Influenza (Flu) Vaccine (Inactivated or Recombinant): What You Need to Know 1. Why get vaccinated? Influenza vaccine can prevent influenza (flu). Flu is a contagious disease that spreads around the [place] every year, usually between October and May. Anyone can get the flu, but it is more dangerous for some people. Infants and young children, people 13 years of age and older, pregnant women, and people with certain health conditions or a weakened immune system are at greatest risk of flu complications. Pneumonia, bronchitis, sinus infections and ear infections are examples of flu-related complications. If you have a medical condition, such as heart disease, cancer or diabetes, flu can make it worse. Flu can cause fever and chills, sore throat, muscle aches, fatigue, cough, headache, and runny or stuffy nose. Some people may have vomiting and  diarrhea, though this is more common in children than adults. Each year thousands of people in the [place] die from flu, and many more are hospitalized. Flu vaccine prevents millions of illnesses and flu-related visits to the doctor each year. 2. Influenza vaccine CDC recommends everyone 72 months of age and older get vaccinated every flu season. Children 6 months through 94 years of age may need 2 doses during a single flu season. Everyone else needs only 1 dose each flu season. It takes about 2 weeks for protection to develop after vaccination. There are many flu viruses, and they are always changing. Each year a new flu vaccine is made to protect against three or four viruses that are likely to cause disease in the upcoming flu season. Even when the vaccine doesn't exactly match these viruses, it may still provide some protection. Influenza vaccine does not cause flu. Influenza vaccine may be given at the same time as other vaccines. 3. Talk with your health care provider Tell your vaccine provider if the person getting the vaccine:  Has had an allergic reaction after a previous dose of influenza vaccine, or has any severe, life-threatening allergies.  Has ever had Guillain-Barr Syndrome (also called GBS). In some cases, your health care provider may decide to postpone influenza vaccination to a future visit. People with minor illnesses, such as a cold, may be vaccinated. People who are moderately or severely ill should usually wait until they recover before getting influenza vaccine. Your health care provider can give you more information. 4. Risks of a vaccine reaction  Soreness, redness, and swelling where shot is given, fever, muscle aches, and headache  can happen after influenza vaccine.  There may be a very small increased risk of Guillain-Barr Syndrome (GBS) after inactivated influenza vaccine (the flu shot). Young children who get the flu shot along with pneumococcal vaccine  (PCV13), and/or DTaP vaccine at the same time might be slightly more likely to have a seizure caused by fever. Tell your health care provider if a child who is getting flu vaccine has ever had a seizure. People sometimes faint after medical procedures, including vaccination. Tell your provider if you feel dizzy or have vision changes or ringing in the ears. As with any medicine, there is a very remote chance of a vaccine causing a severe allergic reaction, other serious injury, or death. 5. What if there is a serious problem? An allergic reaction could occur after the vaccinated person leaves the clinic. If you see signs of a severe allergic reaction (hives, swelling of the face and throat, difficulty breathing, a fast heartbeat, dizziness, or weakness), call 9-1-1 and get the person to the nearest hospital. For other signs that concern you, call your health care provider. Adverse reactions should be reported to the Vaccine Adverse Event Reporting System (VAERS). Your health care provider will usually file this report, or you can do it yourself. Visit the VAERS website at www.vaers.SamedayNews.es or call (504) 805-2771.VAERS is only for reporting reactions, and VAERS staff do not give medical advice. 6. The National Vaccine Injury Compensation Program The [employer] Vaccine Injury Compensation Program (VICP) is a federal program that was created to compensate people who may have been injured by certain vaccines. Visit the VICP website at GoldCloset.com.ee or call 909-799-4688 to learn about the program and about filing a claim. There is a time limit to file a claim for compensation. 7. How can I learn more?  Ask your healthcare provider.  Call your local or state health department.  Contact the Centers for Disease Control and Prevention (CDC): ? Call 716-673-2526 (1-800-CDC-INFO) or ? Visit CDC's https://gibson.com/ Vaccine Information Statement (Interim) Inactivated Influenza Vaccine  (12/07/2017) This information is not intended to replace advice given to you by your health care provider. Make sure you discuss any questions you have with your health care provider. Document Released: 02/03/2006 Document Revised: 07/31/2018 Document Reviewed: 12/11/2017 Elsevier Patient Education  2020 [employer].

## 2019-03-18 ENCOUNTER — Telehealth: Payer: Self-pay | Admitting: Hematology

## 2019-03-18 LAB — KAPPA/LAMBDA LIGHT CHAINS
Kappa free light chain: 32.8 mg/L — ABNORMAL HIGH (ref 3.3–19.4)
Kappa, lambda light chain ratio: 7.81 — ABNORMAL HIGH (ref 0.26–1.65)
Lambda free light chains: 4.2 mg/L — ABNORMAL LOW (ref 5.7–26.3)

## 2019-03-18 NOTE — Telephone Encounter (Signed)
Scheduled appt per 11/20 los.  Spoke with pt and he is aware of his appt date and time

## 2019-03-19 ENCOUNTER — Other Ambulatory Visit: Payer: Self-pay

## 2019-03-19 DIAGNOSIS — C9000 Multiple myeloma not having achieved remission: Secondary | ICD-10-CM

## 2019-03-19 MED ORDER — POMALIDOMIDE 2 MG PO CAPS: 2.0000 mg | ORAL_CAPSULE | Freq: Every day | ORAL | 0 refills | Status: AC

## 2019-03-19 NOTE — Telephone Encounter (Signed)
Faxed script for [employer] with [place] [phone] to [facility].

## 2019-03-20 LAB — MULTIPLE MYELOMA PANEL, SERUM
Albumin SerPl Elph-Mcnc: 3.8 g/dL (ref 2.9–4.4)
Albumin/Glob SerPl: 1.1 (ref 0.7–1.7)
Alpha 1: 0.3 g/dL (ref 0.0–0.4)
Alpha2 Glob SerPl Elph-Mcnc: 1 g/dL (ref 0.4–1.0)
B-Globulin SerPl Elph-Mcnc: 2.4 g/dL — ABNORMAL HIGH (ref 0.7–1.3)
Gamma Glob SerPl Elph-Mcnc: 0.2 g/dL — ABNORMAL LOW (ref 0.4–1.8)
Globulin, Total: 3.8 g/dL (ref 2.2–3.9)
IgA: 27 mg/dL — ABNORMAL LOW (ref 90–386)
IgG (Immunoglobin G), Serum: 1427 mg/dL (ref 603–1613)
IgM (Immunoglobulin M), Srm: 38 mg/dL (ref 20–172)
M Protein SerPl Elph-Mcnc: 1.2 g/dL — ABNORMAL HIGH
Total Protein ELP: 7.6 g/dL (ref 6.0–8.5)

## 2019-03-22 ENCOUNTER — Ambulatory Visit: Payer: Medicaid Other

## 2019-03-22 ENCOUNTER — Inpatient Hospital Stay: Payer: Medicaid Other

## 2019-03-22 ENCOUNTER — Other Ambulatory Visit: Payer: Self-pay

## 2019-03-22 VITALS — BP 119/94 | HR 93 | Temp 98.7°F | Resp 18

## 2019-03-22 DIAGNOSIS — C9000 Multiple myeloma not having achieved remission: Secondary | ICD-10-CM

## 2019-03-22 DIAGNOSIS — Z5112 Encounter for antineoplastic immunotherapy: Secondary | ICD-10-CM | POA: Diagnosis not present

## 2019-03-22 LAB — CMP (CANCER CENTER ONLY)
ALT: 9 U/L (ref 0–44)
AST: 9 U/L — ABNORMAL LOW (ref 15–41)
Albumin: 3.4 g/dL — ABNORMAL LOW (ref 3.5–5.0)
Alkaline Phosphatase: 84 U/L (ref 38–126)
Anion gap: 11 (ref 5–15)
BUN: 8 mg/dL (ref 6–20)
CO2: 21 mmol/L — ABNORMAL LOW (ref 22–32)
Calcium: 8.8 mg/dL — ABNORMAL LOW (ref 8.9–10.3)
Chloride: 106 mmol/L (ref 98–111)
Creatinine: 0.88 mg/dL (ref 0.61–1.24)
GFR, Est AFR Am: >60 mL/min (ref >60)
GFR, Estimated: >60 mL/min (ref >60)
Glucose, Bld: 228 mg/dL — ABNORMAL HIGH (ref 70–99)
Potassium: 3.7 mmol/L (ref 3.5–5.1)
Sodium: 138 mmol/L (ref 135–145)
Total Bilirubin: 0.3 mg/dL (ref 0.3–1.2)
Total Protein: 7.2 g/dL (ref 6.5–8.1)

## 2019-03-22 LAB — CBC WITH DIFFERENTIAL (CANCER CENTER ONLY)
Abs Immature Granulocytes: 0.02 10*3/uL (ref 0.00–0.07)
Basophils Absolute: 0 10*3/uL (ref 0.0–0.1)
Basophils Relative: 1 %
Eosinophils Absolute: 0.2 10*3/uL (ref 0.0–0.5)
Eosinophils Relative: 3 %
HCT: 33.3 % — ABNORMAL LOW (ref 39.0–52.0)
Hemoglobin: 11.1 g/dL — ABNORMAL LOW (ref 13.0–17.0)
Immature Granulocytes: 0 %
Lymphocytes Relative: 36 %
Lymphs Abs: 1.8 10*3/uL (ref 0.7–4.0)
MCH: 32.2 pg (ref 26.0–34.0)
MCHC: 33.3 g/dL (ref 30.0–36.0)
MCV: 96.5 fL (ref 80.0–100.0)
Monocytes Absolute: 0.7 10*3/uL (ref 0.1–1.0)
Monocytes Relative: 14 %
Neutro Abs: 2.4 10*3/uL (ref 1.7–7.7)
Neutrophils Relative %: 46 %
Platelet Count: 335 10*3/uL (ref 150–400)
RBC: 3.45 MIL/uL — ABNORMAL LOW (ref 4.22–5.81)
RDW: 15.3 % (ref 11.5–15.5)
WBC Count: 5.1 10*3/uL (ref 4.0–10.5)
nRBC: 0 % (ref 0.0–0.2)

## 2019-03-22 MED ORDER — DEXAMETHASONE 4 MG PO TABS
10.0000 mg | ORAL_TABLET | Freq: Once | ORAL | Status: AC
  Administered 2019-03-22: 10 mg via ORAL

## 2019-03-22 MED ORDER — DIPHENHYDRAMINE HCL 25 MG PO CAPS
50.0000 mg | ORAL_CAPSULE | Freq: Once | ORAL | Status: AC
  Administered 2019-03-22: 50 mg via ORAL

## 2019-03-22 MED ORDER — MONTELUKAST SODIUM 10 MG PO TABS
10.0000 mg | ORAL_TABLET | Freq: Once | ORAL | Status: AC
  Administered 2019-03-22: 10 mg via ORAL

## 2019-03-22 MED ORDER — ACETAMINOPHEN 325 MG PO TABS
ORAL_TABLET | ORAL | Status: AC
  Filled 2019-03-22: qty 2

## 2019-03-22 MED ORDER — DEXAMETHASONE 4 MG PO TABS
ORAL_TABLET | ORAL | Status: AC
  Filled 2019-03-22: qty 3

## 2019-03-22 MED ORDER — DARATUMUMAB-HYALURONIDASE-FIHJ 1800-30000 MG-UT/15ML ~~LOC~~ SOLN
1800.0000 mg | Freq: Once | SUBCUTANEOUS | Status: AC
  Administered 2019-03-22: 1800 mg via SUBCUTANEOUS
  Filled 2019-03-22: qty 15

## 2019-03-22 MED ORDER — MONTELUKAST SODIUM 10 MG PO TABS
ORAL_TABLET | ORAL | Status: AC
  Filled 2019-03-22: qty 1

## 2019-03-22 MED ORDER — ACETAMINOPHEN 325 MG PO TABS
650.0000 mg | ORAL_TABLET | Freq: Once | ORAL | Status: AC
  Administered 2019-03-22: 650 mg via ORAL

## 2019-03-22 MED ORDER — DIPHENHYDRAMINE HCL 25 MG PO CAPS
ORAL_CAPSULE | ORAL | Status: AC
  Filled 2019-03-22: qty 2

## 2019-03-22 NOTE — Progress Notes (Signed)
Patient refused post Faspro observation and vitals. Patient had no complaints and discharged in stable condition.

## 2019-03-22 NOTE — Patient Instructions (Signed)
[facility] Discharge Instructions for Patients Receiving Chemotherapy  Today you received the following chemotherapy agents:  Daratumumab  To help prevent nausea and vomiting after your treatment, we encourage you to take your nausea medication as prescribed.   If you develop nausea and vomiting that is not controlled by your nausea medication, call the clinic.   BELOW ARE SYMPTOMS THAT SHOULD BE REPORTED IMMEDIATELY:  *FEVER GREATER THAN 100.5 F  *CHILLS WITH OR WITHOUT FEVER  NAUSEA AND VOMITING THAT IS NOT CONTROLLED WITH YOUR NAUSEA MEDICATION  *UNUSUAL SHORTNESS OF BREATH  *UNUSUAL BRUISING OR BLEEDING  TENDERNESS IN MOUTH AND THROAT WITH OR WITHOUT PRESENCE OF ULCERS  *URINARY PROBLEMS  *BOWEL PROBLEMS  UNUSUAL RASH Items with * indicate a potential emergency and should be followed up as soon as possible.  Feel free to call the clinic should you have any questions or concerns. The clinic phone number is [phone].  Please show the [facility] at check-in to the Emergency Department and triage nurse.   Influenza (Flu) Vaccine (Inactivated or Recombinant): What You Need to Know 1. Why get vaccinated? Influenza vaccine can prevent influenza (flu). Flu is a contagious disease that spreads around the [place] every year, usually between October and May. Anyone can get the flu, but it is more dangerous for some people. Infants and young children, people 13 years of age and older, pregnant women, and people with certain health conditions or a weakened immune system are at greatest risk of flu complications. Pneumonia, bronchitis, sinus infections and ear infections are examples of flu-related complications. If you have a medical condition, such as heart disease, cancer or diabetes, flu can make it worse. Flu can cause fever and chills, sore throat, muscle aches, fatigue, cough, headache, and runny or stuffy nose. Some people may have vomiting and  diarrhea, though this is more common in children than adults. Each year thousands of people in the [place] die from flu, and many more are hospitalized. Flu vaccine prevents millions of illnesses and flu-related visits to the doctor each year. 2. Influenza vaccine CDC recommends everyone 72 months of age and older get vaccinated every flu season. Children 6 months through 94 years of age may need 2 doses during a single flu season. Everyone else needs only 1 dose each flu season. It takes about 2 weeks for protection to develop after vaccination. There are many flu viruses, and they are always changing. Each year a new flu vaccine is made to protect against three or four viruses that are likely to cause disease in the upcoming flu season. Even when the vaccine doesn't exactly match these viruses, it may still provide some protection. Influenza vaccine does not cause flu. Influenza vaccine may be given at the same time as other vaccines. 3. Talk with your health care provider Tell your vaccine provider if the person getting the vaccine:  Has had an allergic reaction after a previous dose of influenza vaccine, or has any severe, life-threatening allergies.  Has ever had Guillain-Barr Syndrome (also called GBS). In some cases, your health care provider may decide to postpone influenza vaccination to a future visit. People with minor illnesses, such as a cold, may be vaccinated. People who are moderately or severely ill should usually wait until they recover before getting influenza vaccine. Your health care provider can give you more information. 4. Risks of a vaccine reaction  Soreness, redness, and swelling where shot is given, fever, muscle aches, and headache  can happen after influenza vaccine.  There may be a very small increased risk of Guillain-Barr Syndrome (GBS) after inactivated influenza vaccine (the flu shot). Young children who get the flu shot along with pneumococcal vaccine  (PCV13), and/or DTaP vaccine at the same time might be slightly more likely to have a seizure caused by fever. Tell your health care provider if a child who is getting flu vaccine has ever had a seizure. People sometimes faint after medical procedures, including vaccination. Tell your provider if you feel dizzy or have vision changes or ringing in the ears. As with any medicine, there is a very remote chance of a vaccine causing a severe allergic reaction, other serious injury, or death. 5. What if there is a serious problem? An allergic reaction could occur after the vaccinated person leaves the clinic. If you see signs of a severe allergic reaction (hives, swelling of the face and throat, difficulty breathing, a fast heartbeat, dizziness, or weakness), call 9-1-1 and get the person to the nearest hospital. For other signs that concern you, call your health care provider. Adverse reactions should be reported to the Vaccine Adverse Event Reporting System (VAERS). Your health care provider will usually file this report, or you can do it yourself. Visit the VAERS website at www.vaers.SamedayNews.es or call (504) 805-2771.VAERS is only for reporting reactions, and VAERS staff do not give medical advice. 6. The National Vaccine Injury Compensation Program The [employer] Vaccine Injury Compensation Program (VICP) is a federal program that was created to compensate people who may have been injured by certain vaccines. Visit the VICP website at GoldCloset.com.ee or call 909-799-4688 to learn about the program and about filing a claim. There is a time limit to file a claim for compensation. 7. How can I learn more?  Ask your healthcare provider.  Call your local or state health department.  Contact the Centers for Disease Control and Prevention (CDC): ? Call 716-673-2526 (1-800-CDC-INFO) or ? Visit CDC's https://gibson.com/ Vaccine Information Statement (Interim) Inactivated Influenza Vaccine  (12/07/2017) This information is not intended to replace advice given to you by your health care provider. Make sure you discuss any questions you have with your health care provider. Document Released: 02/03/2006 Document Revised: 07/31/2018 Document Reviewed: 12/11/2017 Elsevier Patient Education  2020 [employer].

## 2019-03-25 DIAGNOSIS — Z5112 Encounter for antineoplastic immunotherapy: Secondary | ICD-10-CM | POA: Diagnosis not present

## 2019-03-26 LAB — UIFE/LIGHT CHAINS/TP QN, 24-HR UR
FR KAPPA LT CH,24HR: 47.41 mg/24 hr
FR LAMBDA LT CH,24HR: 2.45 mg/24 hr
Free Kappa Lt Chains,Ur: 19.35 mg/L (ref 0.63–113.79)
Free Kappa/Lambda Ratio: 19.35 (ref 1.03–31.76)
Free Lambda Lt Chains,Ur: 1 mg/L (ref 0.47–11.77)
Total Protein, Urine-Ur/day: 872 mg/24 hr — ABNORMAL HIGH (ref 30–150)
Total Protein, Urine: 35.6 mg/dL
Total Volume: 2450

## 2019-03-27 ENCOUNTER — Telehealth: Payer: Self-pay

## 2019-03-27 NOTE — Telephone Encounter (Signed)
Received notification from [facility] that Pomalyst was shipped on 03/26/2019

## 2019-03-28 ENCOUNTER — Other Ambulatory Visit: Payer: Self-pay | Admitting: Hematology

## 2019-03-28 DIAGNOSIS — C9000 Multiple myeloma not having achieved remission: Secondary | ICD-10-CM

## 2019-04-03 NOTE — Progress Notes (Signed)
Edward Todd   [phone] [phone]   Clinic Follow up Note   Patient Care Team: London Pepper, MD as PCP - General (Family Medicine)  Date of Service:  04/05/2019  CHIEF COMPLAINT: F/u of MM  SUMMARY OF ONCOLOGIC HISTORY: Oncology History Overview Note  Cancer Staging Multiple myeloma ([place]) Staging form: Plasma Cell Myeloma and Plasma Cell Disorders, AJCC 8th Edition - Clinical stage from 09/04/2018: RISS Stage III (Beta-2-microglobulin (mg/L): 5.5, Albumin (g/dL): 2.6, ISS: Stage III, High-risk cytogenetics: Absent, LDH: Elevated) - Signed by Truitt Merle, MD on 09/26/2018     Multiple myeloma ([place])  08/31/2018 Initial Diagnosis   Multiple myeloma ([place])   09/04/2018 Cancer Staging   Staging form: Plasma Cell Myeloma and Plasma Cell Disorders, AJCC 8th Edition - Clinical stage from 09/04/2018: RISS Stage III (Beta-2-microglobulin (mg/L): 5.5, Albumin (g/dL): 2.6, ISS: Stage III, High-risk cytogenetics: Absent, LDH: Elevated) - Signed by Truitt Merle, MD on 09/26/2018   09/04/2018 Initial Biopsy   Diagnosis 09/04/18 Bone Marrow, Aspirate,Biopsy, and Clot, left posterior iliac crest BONE MARROW: - CELLULAR MARROW WITH INVOLVEMENT BY PLASMA CELL NEOPLASM (>95%) - SEE COMMENT PERIPHERAL BLOOD: - NORMOCYTIC ANEMIA - ROULEAUX FORMATION - SEE COMPLETE BLOOD COUNT   09/05/2018 - 12/28/2018 Chemotherapy   Weekly velcade injection and dexamethasone 47m starting 09/05/18             -increase Velcade injection to twice a week (Fridays/Mondays) for 2 weeks on, 1 week off starting 10/19/18. Will return to weekly Velcade starting 11/09/18.              -Dexa stopped 12/14/18 due to hyperglycemia. Restart on 9/4 at 261mWeekly.              -Stop Velcade and change Dexa after 12/28/18.    09/05/2018 -  Chemotherapy    Monthly Zometa started on 09/05/2018. Held since 02/01/19 due to teeth extraction and BM transplant.    09/14/2018 - 12/21/2018 Chemotherapy   Revlimid 2581m  weeks on/1 week off starting 09/14/18. Stopped 12/21/18 due to abdominal pain.     12/29/2018 - 03/29/2019 Chemotherapy   Pomalyst 4mg52mweeks on/ 1 week off starting 12/29/18.    -Due to neutropenia, possible related hallucination Pomalyst was reduced to 2mg 75mrting 02/06/19 and Dara held for 2 doses. Last dose of C2 taken on 03/29/19. Planned to started Cycle 3 on 04/05/19 but did not proceed [facility].    01/04/2019 - 04/05/2019 Chemotherapy   IV weekly Daratumumab and oral Dexa 8mg d3mbefore infusion starting 01/04/19             -Changed Dara infusion to Faspro injection starting 02/08/19, completed weekly C1 and C2 on 03/22/19. Planned to start C3 every 2 weeks but stopped after 04[facility]05/19 [facility]       CURRENT THERAPY:  1. Weekly velcade injection and dexamethasone 40mg s2ming 09/05/18,Revlimid 25mgdai73mweeks onand 1 week off starting 09/14/18.  -increase Velcade injection to twice a week (Fridays/Mondays) for 2 weeks on, 1 week off starting 10/19/18. Will return to weekly Velcade starting 7/17/20due to poor tolerance. -Dexa stopped 12/14/18 due to hyperglycemia. Will restart on 9/4 at 20mg wee73m  -Due to Abdominal pain, Revlimid stopped 12/21/18. -Stop Velcade and change Dexa after 12/28/18. 2. MonthlyZometastarted on 09/05/2018. Held since 02/01/19 due to teeth extraction and BM transplant.  3. IV weekly Daratumumab and oral Dexa 8mg day b75mreand day ofinfusion starting 01/04/19, Pomalyst 4mg 3 week63mn/  1 week off starting 12/29/18. -Due to neutropenia, possible related hallucination Pomalyst was reduced to 71m starting 02/06/19 and Dara held for2 doses. Planned to started Cycle 3 on 04/05/19 but did not proceed due to BM transplant.  -Changed Dara infusion to Faspro injection starting 02/08/19, completed weekly C1 and C2 on 03/22/19. Planned to start C3 every 2 weeks but stopped after  [facility]05[facility]   INTERVAL HISTORY:  Edward Rochfordis here for a follow up and treatment. He presents to the clinic alone. He notes he is doing well. He has met with Dr. RNorma Fredricksonand will get more information on when he starts his transplant process. He notes he finished his last cycle and started his C3 today. He is not sure when he stops current regimen to proceed with transplant process.  He notes his low back pain is currently at a 2/10 but as the day goes and in the cold weather his pain can flare and he will need hydrocodone. He also still has teeth pain from cracked tooth and does not plan to get it removed until after bone marrow transplant. He overall takes Hydrocodone once in the morning and once at night.     REVIEW OF SYSTEMS:   Constitutional: Denies fevers, chills or abnormal weight loss Eyes: Denies blurriness of vision Ears, nose, mouth, throat, and face: Denies mucositis or sore throat Respiratory: Denies cough, dyspnea or wheezes Cardiovascular: Denies palpitation, chest discomfort or lower extremity swelling Gastrointestinal:  Denies nausea, heartburn or change in bowel habits Skin: Denies abnormal skin rashes MSK: (+) Hydrocodone  Lymphatics: Denies new lymphadenopathy or easy bruising Neurological:Denies numbness, tingling or new weaknesses Behavioral/Psych: Mood is stable, no new changes  All other systems were reviewed with the patient and are negative.  MEDICAL HISTORY:  Past Medical History:  Diagnosis Date  . Cancer ([place]   . [place] mellitus without complication ([place]   . Hypertension     SURGICAL HISTORY: History reviewed. No pertinent surgical history.  I have reviewed the social history and family history with the patient and they are unchanged from previous note.  ALLERGIES:  is allergic to shellfish allergy.  MEDICATIONS:  Current Outpatient Medications  Medication Sig Dispense Refill  . acetaminophen (TYLENOL) 500 MG  tablet Take 500 mg by mouth every 6 (six) hours as needed for mild pain.    .Marland Kitchenacyclovir (ZOVIRAX) 400 MG tablet TAKE 1 TABLET BY MOUTH TWICE A DAY 60 tablet 0  . aspirin 81 MG chewable tablet Chew 1 tablet (81 mg total) by mouth daily.    . calcium-vitamin D (OSCAL WITH D) 500-200 MG-UNIT tablet Take 1 tablet by mouth 2 (two) times daily. 60 tablet 1  . Continuous Blood Gluc Receiver (FREESTYLE LIBRE 14 DAY READER) DEVI See admin instructions.    . Continuous Blood Gluc Sensor (FREESTYLE LIBRE 14 DAY SENSOR) MISC APPLY EVERY 14 DAYS    . dexamethasone (DECADRON) 4 MG tablet Take 5 tablets (20 mg total) by mouth every Friday. 20 tablet 2  . HUMALOG KWIKPEN 100 UNIT/ML KwikPen Inject 0-10 Units into the skin daily as needed (blood sugar).     .Marland KitchenHYDROcodone-Acetaminophen 5-300 MG TABS Take 0.5-1 tablets by mouth every 12 (twelve) hours as needed. 40 tablet 0  . Melatonin 3 MG CAPS Take 1 capsule (3 mg total) by mouth at bedtime as needed (sleep). 30 capsule 0  . metFORMIN (GLUCOPHAGE) 1000 MG tablet Take 1 tablet (1,000 mg total) by mouth 2 (two) times  daily with a meal. 180 tablet 1  . methocarbamol (ROBAXIN) 750 MG tablet Take 1 tablet (750 mg total) by mouth 3 (three) times daily as needed for muscle spasms. 90 tablet 1  . metoCLOPramide (REGLAN) 10 MG tablet Take 1 tablet (10 mg total) by mouth every 8 (eight) hours as needed for nausea. 20 tablet 0  . ondansetron (ZOFRAN-ODT) 4 MG disintegrating tablet Take 1 tablet (4 mg total) by mouth every 6 (six) hours as needed for nausea or vomiting. 20 tablet 0  . ONETOUCH VERIO test strip USE TO CHECK BLOOD SUGAR 3 TIMES A DAY    . oxycodone (OXY-IR) 5 MG capsule Take 5 mg by mouth 2 (two) times daily.    . pantoprazole (PROTONIX) 40 MG tablet TAKE 1 TABLET (40 MG TOTAL) BY MOUTH DAILY AT 6 (SIX) AM. 90 tablet 1  . polyethylene glycol (MIRALAX / GLYCOLAX) 17 g packet Take 17 g by mouth daily. (Patient taking differently: Take 17 g by mouth daily as needed  for mild constipation. ) 14 each 0  . pomalidomide (POMALYST) 2 MG capsule Take 1 capsule (2 mg total) by mouth daily. 21 capsule 0  . potassium chloride (KLOR-CON) 20 MEQ packet Take 20 mEq by mouth daily. 30 packet 1  . senna-docusate (SENOKOT-S) 8.6-50 MG tablet Take 1 tablet by mouth 2 (two) times daily. (Patient taking differently: Take 1 tablet by mouth 2 (two) times daily as needed for mild constipation. )     No current facility-administered medications for this visit.    PHYSICAL EXAMINATION: ECOG PERFORMANCE STATUS: 2 - Symptomatic, <50% confined to bed  Vitals:   04/05/19 0851  BP: (!) 129/99  Pulse: 85  Resp: 20  Temp: 97.8 F (36.6 C)  SpO2: 100%   Filed Weights   04/05/19 0851  Weight: 226 lb 14.4 oz (102.9 kg)    GENERAL:alert, no distress and comfortable SKIN: skin color, texture, turgor are normal, no rashes or significant lesions EYES: normal, Conjunctiva are pink and non-injected, sclera clear  NECK: supple, thyroid normal size, non-tender, without nodularity LYMPH:  no palpable lymphadenopathy in the cervical, axillary  LUNGS: clear to auscultation and percussion with normal breathing effort HEART: regular rate & rhythm and no murmurs and no lower extremity edema ABDOMEN:abdomen soft, non-tender and normal bowel sounds Musculoskeletal:no cyanosis of digits and no clubbing  NEURO: alert & oriented x 3 with fluent speech, no focal motor/sensory deficits  LABORATORY DATA:  I have reviewed the data as listed CBC Latest Ref Rng & Units 04/05/2019 03/22/2019 03/15/2019  WBC 4.0 - 10.5 K/uL 3.8(L) 5.1 5.6  Hemoglobin 13.0 - 17.0 g/dL 11.5(L) 11.1(L) 11.7(L)  Hematocrit 39.0 - 52.0 % 33.8(L) 33.3(L) 34.9(L)  Platelets 150 - 400 K/uL 317 335 433(H)     CMP Latest Ref Rng & Units 04/05/2019 03/22/2019 03/15/2019  Glucose 70 - 99 mg/dL 197(H) 228(H) 186(H)  BUN 6 - 20 mg/dL '12 8 13  ' Creatinine 0.61 - 1.24 mg/dL 0.90 0.88 0.86  Sodium 135 - 145 mmol/L 139 138  139  Potassium 3.5 - 5.1 mmol/L 4.0 3.7 4.1  Chloride 98 - 111 mmol/L 107 106 104  CO2 22 - 32 mmol/L 21(L) 21(L) 23  Calcium 8.9 - 10.3 mg/dL 9.3 8.8(L) 9.2  Total Protein 6.5 - 8.1 g/dL 8.0 7.2 8.4(H)  Total Bilirubin 0.3 - 1.2 mg/dL 0.4 0.3 0.4  Alkaline Phos 38 - 126 U/L 82 84 85  AST 15 - 41 U/L 11(L) 9(L) 10(L)  ALT 0 - 44 U/L '12 9 11      ' RADIOGRAPHIC STUDIES: I have personally reviewed the radiological images as listed and agreed with the findings in the report. No results found.   ASSESSMENT & PLAN:  Edward Todd is a 43 y.o. male with   1. 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 Work upconfirmed multiple myeloma, unfortunately he has multiple compression fracture of thoracic and lumbar spinefrom this. -I started him oninduction chemo with QQV:ZDGLOV Velcade injections with Dexamethasonestartedon 09/05/18 and oral Revlimid on 09/14/18. -His cytogeneticsand FISHresults showed trisomy 11, FISH panel otherwise negative, this is considered a standard risk.  -Hewason first line chemo VRD,butdue to poor tolerance toRevlimid, I stopped it in August 2020. -He met with Dr. Norma Fredrickson who plans to move forward with bone marrow transplant after about 2 monthsof Dara/Pomalyst.  -Irecommended port placement, he declined -He is currently on second-line weekly Faspo injection, Dexa and Pomalyst88m 3 weeks on/1 week off. He continues Zometa monthly, but will hold it for now due to pending tooth extraction.  -He tolerated cycle 1 and 2 current regimen and doses well. M-protein has been trending down well but still detectable, last was 1.2 on 03/15/19.  -He has met with Dr. RNorma Fredricksonagain and plans to proceed with Transplant process soon. His latest Bone marrow biopsy shows plasma cell neoplasm is down to 10% (was 95% on initial biopsy) , overall great partial response.  -I called Dr.  RNorma Fredrickson per him, will proceed last dose Dara injection today, and hold Pomalyst from today  -Labs reviewed today, WBC 3.8, Hg 11.5, BG 197. Overall adequate to proceed with last Dara injection today.  -Will stop Pomalyst and Dexa after today.  -F/u open, likely after transplant    2. Anemia, secondary to MM -He required blood transfusion 09/12/18.On Oral iron now.  -Has improved since start of treatment.Continue to monitor.  3. Severe low back pain, Headaches -Back painsecondary to thoracic and lumbar compression fractures,lumbar spine stenosisseen on 08/31/18 bone survey.  -He has completed rehab and currently doing PT. His peripheral strength and ambulationcontinues toimprove. -Hepreviouslystopped Oxycodone due to fear of Hallucinations.He is on Robaxin 7544mnd hydrocodone half tablet BID and Tylenol 100053m6hours for HA.  -His current back pain is 2/10 but will flare up to 9/10. He also has 2 cracked teeth with pain. Given he is currently on Zometa and plans to proceed with BM transplant, his teeth extraction has been postponed.  -I discussed after BM transplants I plan to ween him off chemo. He is agreeable.  -All of his pain remains controlled on Hydrocodone BID. He can also take Tylenol for mild to moderate pain. I refilled Hydrocodone today (04/05/19).   4. DM, Type II, hyperglycemia -On Metformin BIDand sliding scale insulin. -He is being seen by Dr. EllLoanne Drillinge has sugar monitor placed on right arm. He is currently on no carb diet and will continuemedications.  -Reduced oral dexa to 8mg38my before infusion and 10mg39mlly as pre-meds in clinic. -He will continue calcium BID and Vitamin D 1000 units.  5. Financial support -I offered him [facility] that are available to him. -He doesnot haveinsurance and hehas applied fordisability.I gave him letter about his disability for 1 year. -I recommend he see advocate LenisAnnamary Rummagebilling  department about help with his medical bills. -He was recently approved for Medicaid.   6. Dental pain  -from cracked wisdom tooth -  last zometa 02/01/2019 -per Dr. Norma Fredrickson, okay to have tooth extraction next week.  Otherwise he has to wait until after bone marrow transplant.   PLAN: -I refilled Hydrocodone today  -Labs reviewed and adequate to proceed with last Darainjectiontoday -will hold on Zometa due to his pending tooth extractionand bone marrow transplant -Stop Pomalystand Dexa after today's dose  -will repeat MM lab today per Dr. Rossie Muskrat request -He will proceed with autologous bone marrow transplant at [facility] -F/u open    No problem-specific Assessment & Plan notes found for this encounter.   Orders Placed This Encounter  Procedures  . Multiple Myeloma Panel (SPEP&IFE w/QIG)    Standing Status:   Future    Standing Expiration Date:   04/04/2020  . Kappa/lambda light chains    Standing Status:   Future    Standing Expiration Date:   04/04/2020   All questions were answered. The patient knows to call the clinic with any problems, questions or concerns. No barriers to learning was detected. I spent 20 minutes counseling the patient face to face. The total time spent in the appointment was 25 minutes and more than 50% was on counseling and review of test results     Truitt Merle, MD 04/05/2019   I, Joslyn Devon, am acting as scribe for Truitt Merle, MD.   I have reviewed the above documentation for accuracy and completeness, and I agree with the above.

## 2019-04-05 ENCOUNTER — Inpatient Hospital Stay (HOSPITAL_BASED_OUTPATIENT_CLINIC_OR_DEPARTMENT_OTHER): Payer: Medicaid Other | Admitting: Hematology

## 2019-04-05 ENCOUNTER — Inpatient Hospital Stay: Payer: Medicaid Other

## 2019-04-05 ENCOUNTER — Inpatient Hospital Stay: Payer: Medicaid Other | Attending: Hematology

## 2019-04-05 ENCOUNTER — Encounter: Payer: Self-pay | Admitting: Hematology

## 2019-04-05 ENCOUNTER — Other Ambulatory Visit: Payer: Self-pay

## 2019-04-05 VITALS — BP 129/99 | HR 85 | Temp 97.8°F | Resp 20 | Ht 75.0 in | Wt 226.9 lb

## 2019-04-05 VITALS — BP 145/96

## 2019-04-05 DIAGNOSIS — C9000 Multiple myeloma not having achieved remission: Secondary | ICD-10-CM | POA: Insufficient documentation

## 2019-04-05 DIAGNOSIS — R109 Unspecified abdominal pain: Secondary | ICD-10-CM | POA: Diagnosis not present

## 2019-04-05 DIAGNOSIS — E119 Type 2 diabetes mellitus without complications: Secondary | ICD-10-CM | POA: Diagnosis not present

## 2019-04-05 DIAGNOSIS — Z7982 Long term (current) use of aspirin: Secondary | ICD-10-CM | POA: Insufficient documentation

## 2019-04-05 DIAGNOSIS — R519 Headache, unspecified: Secondary | ICD-10-CM | POA: Diagnosis not present

## 2019-04-05 DIAGNOSIS — Z5112 Encounter for antineoplastic immunotherapy: Secondary | ICD-10-CM | POA: Diagnosis not present

## 2019-04-05 DIAGNOSIS — Z79899 Other long term (current) drug therapy: Secondary | ICD-10-CM | POA: Diagnosis not present

## 2019-04-05 DIAGNOSIS — M545 Low back pain: Secondary | ICD-10-CM | POA: Insufficient documentation

## 2019-04-05 DIAGNOSIS — Z794 Long term (current) use of insulin: Secondary | ICD-10-CM | POA: Diagnosis not present

## 2019-04-05 DIAGNOSIS — E1165 Type 2 diabetes mellitus with hyperglycemia: Secondary | ICD-10-CM | POA: Insufficient documentation

## 2019-04-05 DIAGNOSIS — D649 Anemia, unspecified: Secondary | ICD-10-CM | POA: Insufficient documentation

## 2019-04-05 DIAGNOSIS — I10 Essential (primary) hypertension: Secondary | ICD-10-CM | POA: Insufficient documentation

## 2019-04-05 LAB — CMP (CANCER CENTER ONLY)
ALT: 12 U/L (ref 0–44)
AST: 11 U/L — ABNORMAL LOW (ref 15–41)
Albumin: 3.9 g/dL (ref 3.5–5.0)
Alkaline Phosphatase: 82 U/L (ref 38–126)
Anion gap: 11 (ref 5–15)
BUN: 12 mg/dL (ref 6–20)
CO2: 21 mmol/L — ABNORMAL LOW (ref 22–32)
Calcium: 9.3 mg/dL (ref 8.9–10.3)
Chloride: 107 mmol/L (ref 98–111)
Creatinine: 0.9 mg/dL (ref 0.61–1.24)
GFR, Est AFR Am: >60 mL/min (ref >60)
GFR, Estimated: >60 mL/min (ref >60)
Glucose, Bld: 197 mg/dL — ABNORMAL HIGH (ref 70–99)
Potassium: 4 mmol/L (ref 3.5–5.1)
Sodium: 139 mmol/L (ref 135–145)
Total Bilirubin: 0.4 mg/dL (ref 0.3–1.2)
Total Protein: 8 g/dL (ref 6.5–8.1)

## 2019-04-05 LAB — CBC WITH DIFFERENTIAL (CANCER CENTER ONLY)
Abs Immature Granulocytes: 0.03 10*3/uL (ref 0.00–0.07)
Basophils Absolute: 0 10*3/uL (ref 0.0–0.1)
Basophils Relative: 0 %
Eosinophils Absolute: 0 10*3/uL (ref 0.0–0.5)
Eosinophils Relative: 0 %
HCT: 33.8 % — ABNORMAL LOW (ref 39.0–52.0)
Hemoglobin: 11.5 g/dL — ABNORMAL LOW (ref 13.0–17.0)
Immature Granulocytes: 1 %
Lymphocytes Relative: 35 %
Lymphs Abs: 1.4 10*3/uL (ref 0.7–4.0)
MCH: 31.7 pg (ref 26.0–34.0)
MCHC: 34 g/dL (ref 30.0–36.0)
MCV: 93.1 fL (ref 80.0–100.0)
Monocytes Absolute: 0.3 10*3/uL (ref 0.1–1.0)
Monocytes Relative: 8 %
Neutro Abs: 2.1 10*3/uL (ref 1.7–7.7)
Neutrophils Relative %: 56 %
Platelet Count: 317 10*3/uL (ref 150–400)
RBC: 3.63 MIL/uL — ABNORMAL LOW (ref 4.22–5.81)
RDW: 14.3 % (ref 11.5–15.5)
WBC Count: 3.8 10*3/uL — ABNORMAL LOW (ref 4.0–10.5)
nRBC: 0 % (ref 0.0–0.2)

## 2019-04-05 MED ORDER — DARATUMUMAB-HYALURONIDASE-FIHJ 1800-30000 MG-UT/15ML ~~LOC~~ SOLN
1800.0000 mg | Freq: Once | SUBCUTANEOUS | Status: AC
  Administered 2019-04-05: 1800 mg via SUBCUTANEOUS
  Filled 2019-04-05: qty 15

## 2019-04-05 MED ORDER — DIPHENHYDRAMINE HCL 25 MG PO CAPS
50.0000 mg | ORAL_CAPSULE | Freq: Once | ORAL | Status: AC
  Administered 2019-04-05: 11:00:00 50 mg via ORAL

## 2019-04-05 MED ORDER — MONTELUKAST SODIUM 10 MG PO TABS
ORAL_TABLET | ORAL | Status: AC
  Filled 2019-04-05: qty 1

## 2019-04-05 MED ORDER — DIPHENHYDRAMINE HCL 25 MG PO CAPS
ORAL_CAPSULE | ORAL | Status: AC
  Filled 2019-04-05: qty 2

## 2019-04-05 MED ORDER — HYDROCODONE-ACETAMINOPHEN 5-300 MG PO TABS: 0.5000 | ORAL_TABLET | Freq: Two times a day (BID) | ORAL | 0 refills | Status: DC | PRN

## 2019-04-05 MED ORDER — ACETAMINOPHEN 325 MG PO TABS
650.0000 mg | ORAL_TABLET | Freq: Once | ORAL | Status: AC
  Administered 2019-04-05: 650 mg via ORAL

## 2019-04-05 MED ORDER — DEXAMETHASONE 4 MG PO TABS
10.0000 mg | ORAL_TABLET | Freq: Once | ORAL | Status: AC
  Administered 2019-04-05: 10 mg via ORAL

## 2019-04-05 MED ORDER — ACETAMINOPHEN 325 MG PO TABS
ORAL_TABLET | ORAL | Status: AC
  Filled 2019-04-05: qty 2

## 2019-04-05 MED ORDER — DEXAMETHASONE 4 MG PO TABS
ORAL_TABLET | ORAL | Status: AC
  Filled 2019-04-05: qty 3

## 2019-04-05 MED ORDER — MONTELUKAST SODIUM 10 MG PO TABS
10.0000 mg | ORAL_TABLET | Freq: Once | ORAL | Status: AC
  Administered 2019-04-05: 10 mg via ORAL

## 2019-04-05 NOTE — Progress Notes (Signed)
Pt tolerated injection well, patient has no post observation time per MD.   Pt stayed 10 min after injection, no adverse effects.  Pt discharged with no distress.

## 2019-04-05 NOTE — Progress Notes (Signed)
Spoke to patient in infusion area.  Explained to him to stop Dara after today.  Do not take anymore Pomalyst.  As far as teeth extraction if he can get appointment for next week will be okay but if not will have to wait until after transplant per Dr. Rodriguez/Dr. Burr Medico.  Patient verbalized an understanding of instructions given.

## 2019-04-08 ENCOUNTER — Telehealth: Payer: Self-pay | Admitting: Hematology

## 2019-04-08 LAB — KAPPA/LAMBDA LIGHT CHAINS
Kappa free light chain: 17.4 mg/L (ref 3.3–19.4)
Kappa, lambda light chain ratio: 2.72 — ABNORMAL HIGH (ref 0.26–1.65)
Lambda free light chains: 6.4 mg/L (ref 5.7–26.3)

## 2019-04-08 NOTE — Progress Notes (Signed)
Counseling Intern spoke to the patient via phone on 04/08/19 for a follow-up counseling session.  Pt stated he has been working hard to stay positive in the face of a lot of challenges. Pt reported he is set to begin the stem cell transplant process at [facility].  He stated he is ready to get it underway and knows he will need support as he undergoes the transplant process. Counseling intern and patient discussed strategies for coping with anxiety and stress including affect labeling, positive distractions, practice of gratitude, and yoga as coping strategies in the months to come. Patient stated he will call when he knows his tx schedule so the counseling intern can schedule times to make calls to offer emotional support.  Art Buff [place] Counseling Intern Voicemail:  [phone]

## 2019-04-08 NOTE — Telephone Encounter (Signed)
Per 12/11 los F/u open

## 2019-04-10 LAB — MULTIPLE MYELOMA PANEL, SERUM
Albumin SerPl Elph-Mcnc: 4 g/dL (ref 2.9–4.4)
Albumin/Glob SerPl: 1.1 (ref 0.7–1.7)
Alpha 1: 0.3 g/dL (ref 0.0–0.4)
Alpha2 Glob SerPl Elph-Mcnc: 1 g/dL (ref 0.4–1.0)
B-Globulin SerPl Elph-Mcnc: 2.3 g/dL — ABNORMAL HIGH (ref 0.7–1.3)
Gamma Glob SerPl Elph-Mcnc: 0.2 g/dL — ABNORMAL LOW (ref 0.4–1.8)
Globulin, Total: 3.7 g/dL (ref 2.2–3.9)
IgA: 28 mg/dL — ABNORMAL LOW (ref 90–386)
IgG (Immunoglobin G), Serum: 1209 mg/dL (ref 603–1613)
IgM (Immunoglobulin M), Srm: 47 mg/dL (ref 20–172)
M Protein SerPl Elph-Mcnc: 1.1 g/dL — ABNORMAL HIGH
Total Protein ELP: 7.7 g/dL (ref 6.0–8.5)

## 2019-04-17 ENCOUNTER — Other Ambulatory Visit: Payer: Self-pay | Admitting: Hematology

## 2019-04-22 ENCOUNTER — Telehealth: Payer: Self-pay

## 2019-04-22 ENCOUNTER — Other Ambulatory Visit: Payer: Self-pay | Admitting: Nurse Practitioner

## 2019-04-22 MED ORDER — HYDROCODONE-ACETAMINOPHEN 5-300 MG PO TABS: 0.5000 | ORAL_TABLET | Freq: Two times a day (BID) | ORAL | 0 refills | Status: AC | PRN

## 2019-04-22 NOTE — Telephone Encounter (Addendum)
Looks like his pain has been controlled with Norco for past couple months and he doesn't take Oxycodone anymore. Can you clarify with him please.  Thanks! Edward Todd    I left a voice message for patient to find out why he is requesting Oxycodone as the Norco had been controlling his pain.  I asked that he call me back.

## 2019-04-22 NOTE — Telephone Encounter (Signed)
Patient calls back stating that he ran out of his pain medications, just wants a refill on either Norco or Oxycodone.

## 2019-04-22 NOTE — Telephone Encounter (Signed)
Patient calls for refill on Oxycodone to be sent into [facility] on file.  

## 2019-04-22 NOTE — Telephone Encounter (Signed)
Done, refilled Norco. Thanks, Regan Rakers

## 2019-04-25 ENCOUNTER — Telehealth: Payer: Self-pay

## 2019-04-25 NOTE — Telephone Encounter (Signed)
Patient calls stating that he received a refill on Hydrocodone but had requested Oxycodone.  According to the chart notes Hydrocodone had been controlling his pain.  I left a voice message and asked him to please let me know what has changed as far as his pain management so I can pass this information along to Dr. Burr Medico.

## 2019-04-29 ENCOUNTER — Other Ambulatory Visit: Payer: Self-pay | Admitting: Hematology

## 2019-04-29 DIAGNOSIS — C9000 Multiple myeloma not having achieved remission: Secondary | ICD-10-CM

## 2019-05-03 NOTE — Progress Notes (Signed)
Left voicemail for patient on 05/03/19 when calling for a scheduled telehealth counseling appointment. Patient encouraged to reach out by phone to schedule another one-on-one phone support call. Counseling intern will follow-up with pt next week.   Verlan Friends [place] Counseling Intern Voicemail: [phone]

## 2019-05-09 ENCOUNTER — Other Ambulatory Visit: Payer: Self-pay | Admitting: Hematology

## 2019-05-10 ENCOUNTER — Other Ambulatory Visit: Payer: Self-pay

## 2019-05-10 DIAGNOSIS — C9000 Multiple myeloma not having achieved remission: Secondary | ICD-10-CM

## 2019-05-10 DIAGNOSIS — Z9481 Bone marrow transplant status: Secondary | ICD-10-CM

## 2019-05-13 ENCOUNTER — Telehealth: Payer: Self-pay | Admitting: Hematology

## 2019-05-13 NOTE — Telephone Encounter (Signed)
Scheduled appt per 1/15 sch message - unable to reach pt . Left message with appt date and time

## 2019-05-22 ENCOUNTER — Other Ambulatory Visit: Payer: Self-pay

## 2019-05-23 ENCOUNTER — Telehealth: Payer: Self-pay

## 2019-05-23 ENCOUNTER — Other Ambulatory Visit: Payer: Self-pay | Admitting: Hematology

## 2019-05-23 ENCOUNTER — Other Ambulatory Visit: Payer: Self-pay

## 2019-05-23 DIAGNOSIS — Z9481 Bone marrow transplant status: Secondary | ICD-10-CM

## 2019-05-23 DIAGNOSIS — C9000 Multiple myeloma not having achieved remission: Secondary | ICD-10-CM

## 2019-05-23 NOTE — Progress Notes (Signed)
[place]   Telephone:([phone]) [phone] Fax:(336) [phone]   Clinic Follow up Note   Patient Care Team: London Pepper, MD as PCP - General (Family Medicine)  Date of Service:  05/29/2019  CHIEF COMPLAINT: F/u of Edward Todd  SUMMARY OF ONCOLOGIC HISTORY: Oncology History Overview Note  Cancer Staging Multiple myeloma ([place]) Staging form: Plasma Cell Myeloma and Plasma Cell Disorders, AJCC 8th Edition - Clinical stage from 09/04/2018: RISS Stage III (Beta-2-microglobulin (mg/L): 5.5, Albumin (g/dL): 2.6, ISS: Stage III, High-risk cytogenetics: Absent, LDH: Elevated) - Signed by Truitt Merle, MD on 09/26/2018     Multiple myeloma (Edward Todd)  08/31/2018 Initial Diagnosis   Multiple myeloma (Edward Todd)   09/04/2018 Cancer Staging   Staging form: Plasma Cell Myeloma and Plasma Cell Disorders, AJCC 8th Edition - Clinical stage from 09/04/2018: RISS Stage III (Beta-2-microglobulin (mg/L): 5.5, Albumin (g/dL): 2.6, ISS: Stage III, High-risk cytogenetics: Absent, LDH: Elevated) - Signed by Truitt Merle, MD on 09/26/2018   09/04/2018 Initial Biopsy   Diagnosis 09/04/18 Bone Marrow, Aspirate,Biopsy, and Clot, left posterior iliac crest BONE MARROW: - CELLULAR MARROW WITH INVOLVEMENT BY PLASMA CELL NEOPLASM (>95%) - SEE COMMENT PERIPHERAL BLOOD: - NORMOCYTIC ANEMIA - ROULEAUX FORMATION - SEE COMPLETE BLOOD COUNT   09/05/2018 - 12/28/2018 Chemotherapy   Weekly velcade injection and dexamethasone 62m starting 09/05/18             -increase Velcade injection to twice a week (Fridays/Mondays) for 2 weeks on, 1 week off starting 10/19/18. Will return to weekly Velcade starting 11/09/18.              -Dexa stopped 12/14/18 due to hyperglycemia. Restart on 9/4 at 214mWeekly.              -Stop Velcade and change Dexa after 12/28/18.    09/05/2018 -  Chemotherapy    Monthly Zometa started on 09/05/2018. Held since 02/01/19 due to teeth extraction and BM transplant.    09/14/2018 - 12/21/2018 Chemotherapy   Revlimid 2526m  weeks on/1 week off starting 09/14/18. Stopped 12/21/18 due to abdominal pain.     12/29/2018 - 03/29/2019 Chemotherapy   Pomalyst 4mg66mweeks on/ 1 week off starting 12/29/18.    -Due to neutropenia, possible related hallucination Pomalyst was reduced to 2mg 52mrting 02/06/19 and Dara held for 2 doses. Last dose of C2 taken on 03/29/19. Planned to started Cycle 3 on 04/05/19 but [facility]    01/04/2019 - 04/05/2019 Chemotherapy   IV weekly Daratumumab and oral Dexa 8mg d9mbefore infusion starting 01/04/19             -Changed Dara infusion to Faspro injection starting 02/08/19, completed weekly C1 and C2 on 03/22/19. Planned to start C3 every 2 weeks but [facility]05[facility]19 [facility]    05/09/2019 Procedure   05/09/19 he underwent BM transplant at [facility]    [facility] maintenance therapy   INTERVAL HISTORY:  Edward Todd for a follow up. He presents to the clinic alone. He underwent BM transplant at [facility] on [facility]14/21. He notes he is still very fatigued. He was hospitalized twice afterward.  I reviewed his medication list with him. He is on Metformin, Zofran, acyclovir, aspirin, oxycodone BID, Tylenol, Reglan, Protonix, potassium. His BG has been in 90-120 range. He notes he still has not had tooth extraction for cracked tooth yet. He is waiting until he recovers. He plans to f/u with  Dr. Norma Fredrickson on 2/10.  He notes his back pain mainly occurs when he stands too long.    REVIEW OF SYSTEMS:   Constitutional: Denies fevers, chills or abnormal weight loss (+) Significant fatigue  Eyes: Denies blurriness of vision Ears, nose, mouth, throat, and face: Denies mucositis or sore throat Respiratory: Denies cough, dyspnea or wheezes Cardiovascular: Denies palpitation, chest discomfort or lower extremity swelling Gastrointestinal:  Denies nausea, heartburn or change in bowel habits Skin: Denies abnormal skin rashes MSK: (+) Minimal back  pain  Lymphatics: Denies new lymphadenopathy or easy bruising Neurological:Denies numbness, tingling or new weaknesses Behavioral/Psych: Mood is stable, no new changes  All other systems were reviewed with the patient and are negative.  MEDICAL HISTORY:  Past Medical History:  Diagnosis Date  . Cancer ([place])   . Diabetes mellitus without complication ([place])   . Hypertension     SURGICAL HISTORY: History reviewed. No pertinent surgical history.  I have reviewed the social history and family history with the patient and they are unchanged from previous note.  ALLERGIES:  is allergic to shellfish allergy.  MEDICATIONS:  Current Outpatient Medications  Medication Sig Dispense Refill  . acetaminophen (TYLENOL) 500 MG tablet Take 500 mg by mouth every 6 (six) hours as needed for mild pain.    Marland Kitchen acyclovir (ZOVIRAX) 400 MG tablet TAKE 1 TABLET BY MOUTH TWICE A DAY 60 tablet 0  . aspirin 81 MG chewable tablet Chew 1 tablet (81 mg total) by mouth daily.    . calcium-vitamin D (OSCAL WITH D) 500-200 MG-UNIT tablet Take 1 tablet by mouth 2 (two) times daily. 60 tablet 1  . Continuous Blood Gluc Receiver (FREESTYLE LIBRE 14 DAY READER) DEVI See admin instructions.    . Continuous Blood Gluc Sensor (FREESTYLE LIBRE 14 DAY SENSOR) MISC APPLY EVERY 14 DAYS    . HUMALOG KWIKPEN 100 UNIT/ML KwikPen Inject 0-10 Units into the skin daily as needed (blood sugar).     Marland Kitchen HYDROcodone-Acetaminophen 5-300 MG TABS Take 0.5-1 tablets by mouth every 12 (twelve) hours as needed. 40 tablet 0  . Melatonin 3 MG CAPS Take 1 capsule (3 mg total) by mouth at bedtime as needed (sleep). 30 capsule 0  . metFORMIN (GLUCOPHAGE) 1000 MG tablet Take 1 tablet (1,000 mg total) by mouth 2 (two) times daily with a meal. 180 tablet 1  . methocarbamol (ROBAXIN) 750 MG tablet Take 1 tablet (750 mg total) by mouth 3 (three) times daily as needed for muscle spasms. 90 tablet 1  . metoCLOPramide (REGLAN) 10 MG tablet Take 1 tablet (10  mg total) by mouth every 8 (eight) hours as needed for nausea. 20 tablet 0  . ondansetron (ZOFRAN-ODT) 4 MG disintegrating tablet Take 1 tablet (4 mg total) by mouth every 6 (six) hours as needed for nausea or vomiting. 20 tablet 0  . ONETOUCH VERIO test strip USE TO CHECK BLOOD SUGAR 3 TIMES A DAY    . oxycodone (OXY-IR) 5 MG capsule Take 5 mg by mouth 2 (two) times daily.    . pantoprazole (PROTONIX) 40 MG tablet TAKE 1 TABLET (40 MG TOTAL) BY MOUTH DAILY AT 6 (SIX) AM. 90 tablet 1  . polyethylene glycol (MIRALAX / GLYCOLAX) 17 g packet Take 17 g by mouth daily. (Patient taking differently: Take 17 g by mouth daily as needed for mild constipation. ) 14 each 0  . pomalidomide (POMALYST) 2 MG capsule Take 1 capsule (2 mg total) by mouth daily. 21 capsule 0  . potassium  chloride (KLOR-CON) 20 MEQ packet EVERY DAY 30 packet 1  . senna-docusate (SENOKOT-S) 8.6-50 MG tablet Take 1 tablet by mouth 2 (two) times daily. (Patient taking differently: Take 1 tablet by mouth 2 (two) times daily as needed for mild constipation. )     No current facility-administered medications for this visit.    PHYSICAL EXAMINATION: ECOG PERFORMANCE STATUS: 3 - Symptomatic, >50% confined to bed  Vitals:   05/29/19 1116  BP: 122/87  Pulse: (!) 56  Resp: 17  Temp: 98.7 F (37.1 C)  SpO2: 100%   Filed Weights   05/29/19 1116  Weight: 211 lb 6.4 oz (95.9 kg)    Due to COVID19 we will limit examination to appearance. Patient had no complaints.  GENERAL:alert, no distress and comfortable SKIN: skin color normal, no rashes or significant lesions EYES: normal, Conjunctiva are pink and non-injected, sclera clear  NEURO: alert & oriented x 3 with fluent speech   LABORATORY DATA:  I have reviewed the data as listed CBC Latest Ref Rng & Units 05/29/2019 04/05/2019 03/22/2019  WBC 4.0 - 10.5 K/uL 2.3(L) 3.8(L) 5.1  Hemoglobin 13.0 - 17.0 g/dL 10.3(L) 11.5(L) 11.1(L)  Hematocrit 39.0 - 52.0 % 30.9(L) 33.8(L) 33.3(L)    Platelets 150 - 400 K/uL 322 317 335     CMP Latest Ref Rng & Units 05/29/2019 04/05/2019 03/22/2019  Glucose 70 - 99 mg/dL 151(H) 197(H) 228(H)  BUN 6 - 20 mg/dL '10 12 8  ' Creatinine 0.61 - 1.24 mg/dL 0.85 0.90 0.88  Sodium 135 - 145 mmol/L 141 139 138  Potassium 3.5 - 5.1 mmol/L 4.0 4.0 3.7  Chloride 98 - 111 mmol/L 107 107 106  CO2 22 - 32 mmol/L 23 21(L) 21(L)  Calcium 8.9 - 10.3 mg/dL 9.0 9.3 8.8(L)  Total Protein 6.5 - 8.1 g/dL 7.1 8.0 7.2  Total Bilirubin 0.3 - 1.2 mg/dL <0.2(L) 0.4 0.3  Alkaline Phos 38 - 126 U/L 73 82 84  AST 15 - 41 U/L 17 11(L) 9(L)  ALT 0 - 44 U/L '15 12 9      ' RADIOGRAPHIC STUDIES: I have personally reviewed the radiological images as listed and agreed with the findings in the report. No results found.   ASSESSMENT & PLAN:  Edward Todd is a 44 y.o. male with    1. 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 Work upconfirmed multiple myeloma, unfortunately he has multiple compression fracture of thoracic and lumbar spinefrom this. -I started him oninduction chemo with OFB:PZWCHE Velcade injections with Dexamethasonestartedon 09/05/18 and oral Revlimid on 09/14/18. -His cytogeneticsand FISHresults showed trisomy 11, FISH panel otherwise negative, this is considered a standard risk.  -Hewason first line chemo VRD,butdue to poor tolerance toRevlimid, I stopped it in August 2020. -He subsequently recieved Dara/Pomalyst/dex for about 2 month beofre [facility] at [facility].  -His 03/29/19 Bone marrow biopsy shows plasma cell neoplasm is down to 10% (was 95% on initial biopsy) , overall great partial response.  -He underwent Bone Marrow Transport at [facility] on 05/09/19. He was hospitalized twice afterward. He is recovering now with significant fatigue. He plans to have tooth extraction after he recovers.  -Plan to repeat bone marrow biopsy in  07/2019. He is at high risk for Edward Todd recurrence. Depends on his response to ASCT and if he has residual disease, we will decide his maintenance treatment  -F/u in 1 month. I encouraged him  to continue PT and eating and drinking well to regain strength.  -I discussed he is still at high risk for infection and to watch for signs of infections. He will have to wait on immunizations or vaccines for now.    2. Anemia, secondary to Edward Todd -He required blood transfusion 09/12/18.On Oral iron now.  -Has improved since start of treatment.Continue to monitor.  3. Severe low back pain, Headaches -Back painsecondary to thoracic and lumbar compression fractures,lumbar spine stenosisseen on 08/31/18 bone survey.  -He has completed rehab and currently doing PT. His peripheral strength and ambulationcontinues toimprove. -Hepreviouslystopped Oxycodone due to fear of Hallucinations.He is on Robaxin 728mand hydrocodonehalf tabletBID and Tylenol 10066mq6hours for HA.  -He only has pain back pain when standing for too long, otherwise minimal.  -He has been on hydrocodone BID, but since BM transplant he has been on 53m39mxycodone BID given by Dr. RodNorma Fredricksonolerating well.   4. DM, Type II, hyperglycemia -On Metformin BID, no longer on insulin.  -He is being seen by Dr. EllLoanne Drilling-He is no longer on Dexa. He will continue calcium BID and Vitamin D 1000 units.  5. Financial support -I offered him [facility] resources that are available to him. -He doesnot haveinsurance and hehas applied fordisability.I gave him letter about his disability for 1 year. -I recommend he see advocate LenAnnamary Rummaged billing department about help with his medical bills. -He was recently approved for Medicaid.   6. Dental pain  -from cracked wisdom tooth -last zometa 02/01/2019 -He is waiting on recovery from transplant before he proceeds with tooth extraction.  -will restart zometa after his tooth  extraction    PLAN: -Lab and F/u in March as scheduled    No problem-specific Assessment & Plan notes found for this encounter.   No orders of the defined types were placed in this encounter.  All questions were answered. The patient knows to call the clinic with any problems, questions or concerns. No barriers to learning was detected. The total time spent in the appointment was 25 minutes.     YanTruitt MerleD 05/29/2019   I, AmoJoslyn Devonm acting as scribe for YanTruitt MerleD.   I have reviewed the above documentation for accuracy and completeness, and I agree with the above.

## 2019-05-23 NOTE — Telephone Encounter (Signed)
Left voice message for patient regarding appointments scheduled for 05/29/2019, lab 11:30 and see Dr. Burr Medico at 12:00.

## 2019-05-29 ENCOUNTER — Inpatient Hospital Stay: Payer: Medicaid Other | Attending: Hematology

## 2019-05-29 ENCOUNTER — Encounter: Payer: Self-pay | Admitting: Hematology

## 2019-05-29 ENCOUNTER — Other Ambulatory Visit: Payer: Self-pay

## 2019-05-29 ENCOUNTER — Inpatient Hospital Stay (HOSPITAL_BASED_OUTPATIENT_CLINIC_OR_DEPARTMENT_OTHER): Payer: Medicaid Other | Admitting: Hematology

## 2019-05-29 ENCOUNTER — Telehealth: Payer: Self-pay

## 2019-05-29 VITALS — BP 122/87 | HR 56 | Temp 98.7°F | Resp 17 | Ht 75.0 in | Wt 211.4 lb

## 2019-05-29 DIAGNOSIS — I10 Essential (primary) hypertension: Secondary | ICD-10-CM | POA: Insufficient documentation

## 2019-05-29 DIAGNOSIS — C9000 Multiple myeloma not having achieved remission: Secondary | ICD-10-CM | POA: Diagnosis not present

## 2019-05-29 DIAGNOSIS — E119 Type 2 diabetes mellitus without complications: Secondary | ICD-10-CM

## 2019-05-29 DIAGNOSIS — Z9481 Bone marrow transplant status: Secondary | ICD-10-CM

## 2019-05-29 LAB — CMP (CANCER CENTER ONLY)
ALT: 15 U/L (ref 0–44)
AST: 17 U/L (ref 15–41)
Albumin: 3.8 g/dL (ref 3.5–5.0)
Alkaline Phosphatase: 73 U/L (ref 38–126)
Anion gap: 11 (ref 5–15)
BUN: 10 mg/dL (ref 6–20)
CO2: 23 mmol/L (ref 22–32)
Calcium: 9 mg/dL (ref 8.9–10.3)
Chloride: 107 mmol/L (ref 98–111)
Creatinine: 0.85 mg/dL (ref 0.61–1.24)
GFR, Est AFR Am: >60 mL/min (ref >60)
GFR, Estimated: >60 mL/min (ref >60)
Glucose, Bld: 151 mg/dL — ABNORMAL HIGH (ref 70–99)
Potassium: 4 mmol/L (ref 3.5–5.1)
Sodium: 141 mmol/L (ref 135–145)
Total Bilirubin: <0.2 mg/dL — ABNORMAL LOW (ref 0.3–1.2)
Total Protein: 7.1 g/dL (ref 6.5–8.1)

## 2019-05-29 LAB — CBC WITH DIFFERENTIAL (CANCER CENTER ONLY)
Abs Immature Granulocytes: 0 10*3/uL (ref 0.00–0.07)
Basophils Absolute: 0 10*3/uL (ref 0.0–0.1)
Basophils Relative: 0 %
Eosinophils Absolute: 0 10*3/uL (ref 0.0–0.5)
Eosinophils Relative: 0 %
HCT: 30.9 % — ABNORMAL LOW (ref 39.0–52.0)
Hemoglobin: 10.3 g/dL — ABNORMAL LOW (ref 13.0–17.0)
Immature Granulocytes: 0 %
Lymphocytes Relative: 39 %
Lymphs Abs: 0.9 10*3/uL (ref 0.7–4.0)
MCH: 30.6 pg (ref 26.0–34.0)
MCHC: 33.3 g/dL (ref 30.0–36.0)
MCV: 91.7 fL (ref 80.0–100.0)
Monocytes Absolute: 0.6 10*3/uL (ref 0.1–1.0)
Monocytes Relative: 26 %
Neutro Abs: 0.8 10*3/uL — ABNORMAL LOW (ref 1.7–7.7)
Neutrophils Relative %: 35 %
Platelet Count: 322 10*3/uL (ref 150–400)
RBC: 3.37 MIL/uL — ABNORMAL LOW (ref 4.22–5.81)
RDW: 13.2 % (ref 11.5–15.5)
WBC Count: 2.3 10*3/uL — ABNORMAL LOW (ref 4.0–10.5)
nRBC: 0 % (ref 0.0–0.2)

## 2019-05-29 LAB — MAGNESIUM: Magnesium: 1.9 mg/dL (ref 1.7–2.4)

## 2019-05-29 NOTE — Telephone Encounter (Signed)
Today's labs and ov note faxed to Gery Pray, RN at [facility] blood and marrow transplant department. Fax [phone].

## 2019-05-30 ENCOUNTER — Telehealth: Payer: Self-pay | Admitting: Hematology

## 2019-05-30 ENCOUNTER — Encounter: Payer: Self-pay | Admitting: Hematology

## 2019-05-30 LAB — KAPPA/LAMBDA LIGHT CHAINS
Kappa free light chain: 1.7 mg/L — ABNORMAL LOW (ref 3.3–19.4)
Kappa, lambda light chain ratio: 0.61 (ref 0.26–1.65)
Lambda free light chains: 2.8 mg/L — ABNORMAL LOW (ref 5.7–26.3)

## 2019-05-30 NOTE — Telephone Encounter (Signed)
No los per 2/3.

## 2019-05-31 ENCOUNTER — Telehealth: Payer: Self-pay

## 2019-05-31 ENCOUNTER — Other Ambulatory Visit: Payer: Self-pay

## 2019-05-31 LAB — MULTIPLE MYELOMA PANEL, SERUM
Albumin SerPl Elph-Mcnc: 3.5 g/dL (ref 2.9–4.4)
Albumin/Glob SerPl: 1.1 (ref 0.7–1.7)
Alpha 1: 0.3 g/dL (ref 0.0–0.4)
Alpha2 Glob SerPl Elph-Mcnc: 1.1 g/dL — ABNORMAL HIGH (ref 0.4–1.0)
B-Globulin SerPl Elph-Mcnc: 1.6 g/dL — ABNORMAL HIGH (ref 0.7–1.3)
Gamma Glob SerPl Elph-Mcnc: 0.2 g/dL — ABNORMAL LOW (ref 0.4–1.8)
Globulin, Total: 3.2 g/dL (ref 2.2–3.9)
IgA: 26 mg/dL — ABNORMAL LOW (ref 90–386)
IgG (Immunoglobin G), Serum: 786 mg/dL (ref 603–1613)
IgM (Immunoglobulin M), Srm: 34 mg/dL (ref 20–172)
M Protein SerPl Elph-Mcnc: 0.6 g/dL — ABNORMAL HIGH
Total Protein ELP: 6.7 g/dL (ref 6.0–8.5)

## 2019-05-31 NOTE — Telephone Encounter (Signed)
Edward Todd called concerned that [name] was nauseated with infrequent vomiting.  I recommended that Edward Todd take zofran 30-60 minutes before breakfast and supper and then take his acyclovir after zofran and eating.  I recommended small frequent protein rich snacks to increase caloric intake.  [name] is drinking glucerna. Edward Todd verbalized understanding.

## 2019-06-04 ENCOUNTER — Other Ambulatory Visit: Payer: Self-pay | Admitting: Hematology

## 2019-06-04 DIAGNOSIS — C9000 Multiple myeloma not having achieved remission: Secondary | ICD-10-CM

## 2019-06-11 NOTE — Progress Notes (Signed)
Spoke to patient on 06/10/19 for a counseling session. Counseling intern provided therapeutic listening, empathy, and normalization of feelings. Pt reported that his stem cell transplant was harder than he expected and his recovery has been slow. Pt stated he has felt alone in the process and has had to "pull himself through". Pt stated that he is overjoyed to be out of the hospital and hopes to find more peace and comfort at home. He stated he is motivated to get better by his son, by his mom, and by imagining being able to travel to [place] again to see friends and family and enjoy [employer]. Pt has been watching "gangster"  movies as he rests and pt and CI discussed adding comedy into his entertainment mix to bring more lightness and humor into his day. Pt expressed he currently cannot utilize his typical coping strategies of working out or going out for dinner. Pt and CI explored ways the pt can increase his emotional well-being as he continues to recover: 1) Meditation (Ex: Ambulance person) 2) Thought Replacement  Pt agreed to try to integrate meditation into his day this week and to explore using thought replacement next week. CI also plans to explore ways to add more activities into his day that help him feel better mentally. Pt also wants to continue to process his experience and work on finding meaning.  Next Counseling Session: Friday, June 13, 2019 at noon via phone   [place] Counseling Intern Voicemail:  [phone]

## 2019-06-17 NOTE — Progress Notes (Signed)
Counseling intern left voicemail for patient on 06/17/19. Message encouraged pt to return call today before 3:00pm if possible, but stated CI will check-in with pt later this week. Pt was called client for counseling session.  Art Buff [place] Counseling Intern Voicemail:  [phone]

## 2019-06-18 ENCOUNTER — Other Ambulatory Visit: Payer: Self-pay | Admitting: Hematology

## 2019-06-23 ENCOUNTER — Other Ambulatory Visit: Payer: Self-pay | Admitting: Hematology

## 2019-06-23 DIAGNOSIS — C9000 Multiple myeloma not having achieved remission: Secondary | ICD-10-CM

## 2019-06-26 ENCOUNTER — Telehealth: Payer: Self-pay

## 2019-06-26 NOTE — Telephone Encounter (Signed)
Pt. Left Vm message in reference to getting his tooth pulled. Left vm message informing Pt. That per Dr. Burr Medico  It is ok with her for him to get his tooth pulled but she wants him to check with transplant team as well.

## 2019-07-01 NOTE — Progress Notes (Signed)
Spoke to patient on 2021 for a telehealth counseling session via phone. Counseling intern provided space for pt to open up about experiences and responded with empathy, compassion, and normalization of feelings. Pt stated he is on day 60 since his stem cell transplant and that he hopes to have more energy by day 100 (April 20). Pt reported feeling weak, but notes that he has made progress from the first few weeks after his transplant. Pt reflected that it felt good to celebrate another birthday last week. He stated he plans to visit family in [place] as soon as he feels strong enough.   Pt and CI discussed how negative thoughts feed depression and pt agreed to continue using positive thought replacement to boost his mood. CI helped pt make meaning of his cancer experience by recognizing his shift in perspective to appreciate being alive and to value every day.   Next Counseling Session:  Monday, July 08, 2019 at 10:30am via phone  Art Buff [facility] Counseling Intern Voicemail:  [phone]

## 2019-07-03 ENCOUNTER — Other Ambulatory Visit: Payer: Self-pay | Admitting: Hematology

## 2019-07-03 DIAGNOSIS — C9000 Multiple myeloma not having achieved remission: Secondary | ICD-10-CM

## 2019-07-11 ENCOUNTER — Other Ambulatory Visit: Payer: Medicaid Other

## 2019-07-11 ENCOUNTER — Inpatient Hospital Stay: Payer: Medicaid Other | Admitting: Hematology

## 2019-07-11 ENCOUNTER — Inpatient Hospital Stay: Payer: Medicaid Other

## 2019-07-15 ENCOUNTER — Inpatient Hospital Stay: Payer: Medicaid Other | Attending: Nurse Practitioner | Admitting: Nurse Practitioner

## 2019-07-15 ENCOUNTER — Inpatient Hospital Stay: Payer: Medicaid Other

## 2019-07-15 ENCOUNTER — Other Ambulatory Visit: Payer: Self-pay

## 2019-07-15 ENCOUNTER — Encounter: Payer: Self-pay | Admitting: Nurse Practitioner

## 2019-07-15 VITALS — BP 125/94 | HR 96 | Temp 98.2°F | Resp 18 | Ht 75.0 in | Wt 208.9 lb

## 2019-07-15 DIAGNOSIS — C9000 Multiple myeloma not having achieved remission: Secondary | ICD-10-CM | POA: Diagnosis present

## 2019-07-15 DIAGNOSIS — D631 Anemia in chronic kidney disease: Secondary | ICD-10-CM | POA: Insufficient documentation

## 2019-07-15 DIAGNOSIS — Z9221 Personal history of antineoplastic chemotherapy: Secondary | ICD-10-CM | POA: Diagnosis not present

## 2019-07-15 DIAGNOSIS — Z9484 Stem cells transplant status: Secondary | ICD-10-CM | POA: Insufficient documentation

## 2019-07-15 DIAGNOSIS — Z794 Long term (current) use of insulin: Secondary | ICD-10-CM | POA: Diagnosis not present

## 2019-07-15 DIAGNOSIS — K5900 Constipation, unspecified: Secondary | ICD-10-CM | POA: Diagnosis not present

## 2019-07-15 DIAGNOSIS — K0889 Other specified disorders of teeth and supporting structures: Secondary | ICD-10-CM | POA: Insufficient documentation

## 2019-07-15 DIAGNOSIS — Z7982 Long term (current) use of aspirin: Secondary | ICD-10-CM | POA: Diagnosis not present

## 2019-07-15 DIAGNOSIS — R51 Headache with orthostatic component, not elsewhere classified: Secondary | ICD-10-CM | POA: Insufficient documentation

## 2019-07-15 DIAGNOSIS — Z9481 Bone marrow transplant status: Secondary | ICD-10-CM | POA: Diagnosis not present

## 2019-07-15 DIAGNOSIS — E1165 Type 2 diabetes mellitus with hyperglycemia: Secondary | ICD-10-CM | POA: Insufficient documentation

## 2019-07-15 DIAGNOSIS — Z79899 Other long term (current) drug therapy: Secondary | ICD-10-CM | POA: Insufficient documentation

## 2019-07-15 DIAGNOSIS — M545 Low back pain: Secondary | ICD-10-CM | POA: Insufficient documentation

## 2019-07-15 LAB — MAGNESIUM: Magnesium: 1.6 mg/dL — ABNORMAL LOW (ref 1.7–2.4)

## 2019-07-15 LAB — CBC WITH DIFFERENTIAL (CANCER CENTER ONLY)
Abs Immature Granulocytes: 0.01 10*3/uL (ref 0.00–0.07)
Basophils Absolute: 0 10*3/uL (ref 0.0–0.1)
Basophils Relative: 0 %
Eosinophils Absolute: 0.1 10*3/uL (ref 0.0–0.5)
Eosinophils Relative: 1 %
HCT: 36.3 % — ABNORMAL LOW (ref 39.0–52.0)
Hemoglobin: 12 g/dL — ABNORMAL LOW (ref 13.0–17.0)
Immature Granulocytes: 0 %
Lymphocytes Relative: 49 %
Lymphs Abs: 2.5 10*3/uL (ref 0.7–4.0)
MCH: 30.2 pg (ref 26.0–34.0)
MCHC: 33.1 g/dL (ref 30.0–36.0)
MCV: 91.4 fL (ref 80.0–100.0)
Monocytes Absolute: 0.3 10*3/uL (ref 0.1–1.0)
Monocytes Relative: 6 %
Neutro Abs: 2.2 10*3/uL (ref 1.7–7.7)
Neutrophils Relative %: 44 %
Platelet Count: 256 10*3/uL (ref 150–400)
RBC: 3.97 MIL/uL — ABNORMAL LOW (ref 4.22–5.81)
RDW: 13.7 % (ref 11.5–15.5)
WBC Count: 5.1 10*3/uL (ref 4.0–10.5)
nRBC: 0 % (ref 0.0–0.2)

## 2019-07-15 LAB — CMP (CANCER CENTER ONLY)
ALT: <6 U/L (ref 0–44)
AST: 13 U/L — ABNORMAL LOW (ref 15–41)
Albumin: 4.2 g/dL (ref 3.5–5.0)
Alkaline Phosphatase: 64 U/L (ref 38–126)
Anion gap: 12 (ref 5–15)
BUN: 10 mg/dL (ref 6–20)
CO2: 24 mmol/L (ref 22–32)
Calcium: 9.4 mg/dL (ref 8.9–10.3)
Chloride: 108 mmol/L (ref 98–111)
Creatinine: 0.86 mg/dL (ref 0.61–1.24)
GFR, Est AFR Am: >60 mL/min (ref >60)
GFR, Estimated: >60 mL/min (ref >60)
Glucose, Bld: 95 mg/dL (ref 70–99)
Potassium: 3.6 mmol/L (ref 3.5–5.1)
Sodium: 144 mmol/L (ref 135–145)
Total Bilirubin: 0.3 mg/dL (ref 0.3–1.2)
Total Protein: 6.8 g/dL (ref 6.5–8.1)

## 2019-07-15 MED ORDER — MAGNESIUM OXIDE 400 (241.3 MG) MG PO TABS: 400.0000 mg | ORAL_TABLET | Freq: Every day | ORAL | 0 refills | Status: DC

## 2019-07-15 NOTE — Progress Notes (Addendum)
[place]   [phone]) [phone] [phone]   Clinic Follow up Note   Patient Care Team: Edward Pepper, MD as PCP - General (Family Medicine) 07/15/2019  CHIEF COMPLAINT: F/u MM s/p ASCT   SUMMARY OF ONCOLOGIC HISTORY: Oncology History Overview Note  Cancer Staging Multiple myeloma ([place]) Staging form: Plasma Cell Myeloma and Plasma Cell Disorders, AJCC 8th Edition - Clinical stage from 09/04/2018: RISS Stage III (Beta-2-microglobulin (mg/L): 5.5, Albumin (g/dL): 2.6, ISS: Stage III, High-risk cytogenetics: Absent, LDH: Elevated) - Signed by Truitt Merle, MD on 09/26/2018     Multiple myeloma ([place])  08/31/2018 Initial Diagnosis   Multiple myeloma ([place])   09/04/2018 Cancer Staging   Staging form: Plasma Cell Myeloma and Plasma Cell Disorders, AJCC 8th Edition - Clinical stage from 09/04/2018: RISS Stage III (Beta-2-microglobulin (mg/L): 5.5, Albumin (g/dL): 2.6, ISS: Stage III, High-risk cytogenetics: Absent, LDH: Elevated) - Signed by Truitt Merle, MD on 09/26/2018   09/04/2018 Initial Biopsy   Diagnosis 09/04/18 Bone Marrow, Aspirate,Biopsy, and Clot, left posterior iliac crest BONE MARROW: - CELLULAR MARROW WITH INVOLVEMENT BY PLASMA CELL NEOPLASM (>95%) - SEE COMMENT PERIPHERAL BLOOD: - NORMOCYTIC ANEMIA - ROULEAUX FORMATION - SEE COMPLETE BLOOD COUNT   09/05/2018 - 12/28/2018 Chemotherapy   Weekly velcade injection and dexamethasone 67m starting 09/05/18             -increase Velcade injection to twice a week (Fridays/Mondays) for 2 weeks on, 1 week off starting 10/19/18. Will return to weekly Velcade starting 11/09/18.              -Dexa stopped 12/14/18 due to hyperglycemia. Restart on 9/4 at 275mWeekly.              -Stop Velcade and change Dexa after 12/28/18.    09/05/2018 -  Chemotherapy    Monthly Zometa started on 09/05/2018. Held since 02/01/19 due to teeth extraction and BM transplant.    09/14/2018 - 12/21/2018 Chemotherapy   Revlimid 2546m weeks on/1  week off starting 09/14/18. Stopped 12/21/18 due to abdominal pain.     12/29/2018 - 03/29/2019 Chemotherapy   Pomalyst 4mg58mweeks on/ 1 week off starting 12/29/18.    -Due to neutropenia, possible related hallucination Pomalyst was reduced to 2mg 74mrting 02/06/19 and Dara held for 2 doses. Last dose of C2 taken on 03/29/19. Planned to started Cycle 3 on 04/05/19 but [facility]    01/04/2019 - 04/05/2019 Chemotherapy   IV weekly Daratumumab and oral Dexa 8mg d4mbefore infusion starting 01/04/19             -Changed Dara infusion to Faspro injection starting 02/08/19, completed weekly C1 and C2 on 03/22/19. Planned to start C3 every 2 weeks but stopped after 04/05/19 [facility]    05/09/2019 Procedure   05/09/19 [facility]    [place] [facility]   INTERVAL HISTORY: Edward Todd for f/u as scheduled. He is just over 2 months from auto stem cell transplant. He feels well. Appetite and energy improve daily. He uses a cane PRN for more walking like today through [facility]. [facility]. He has mild intermittent nausea that is managed with anti-emetics. Takes senokot for constipation. Denies vomiting, diarrhea, abd pain or distention. Denies bleeding. No mucositis. He has a cracked left upper tooth, getting extraction next week. Denies new or worsening pain. Takes pain meds rarely for  back or leg pain. Denies recent fever, chills, cough, chest pain, dyspnea, leg edema. Denies skin rash. Denies depression.    MEDICAL HISTORY:  Past Medical History:  Diagnosis Date  . Cancer ([place])   . Diabetes mellitus without complication ([place])   . Hypertension     SURGICAL HISTORY: History reviewed. No pertinent surgical history.  I have reviewed the social history and family history with the patient and they are unchanged from previous note.  ALLERGIES:  is allergic to shellfish  allergy.  MEDICATIONS:  Current Outpatient Medications  Medication Sig Dispense Refill  . acetaminophen (TYLENOL) 500 MG tablet Take 500 mg by mouth every 6 (six) hours as needed for mild pain.    Marland Kitchen acyclovir (ZOVIRAX) 400 MG tablet TAKE 1 TABLET BY MOUTH TWICE A DAY 180 tablet 1  . aspirin 81 MG chewable tablet Chew 1 tablet (81 mg total) by mouth daily.    . calcium-vitamin D (OSCAL WITH D) 500-200 MG-UNIT tablet Take 1 tablet by mouth 2 (two) times daily. 60 tablet 1  . Continuous Blood Gluc Receiver (FREESTYLE LIBRE 14 DAY READER) DEVI See admin instructions.    . Continuous Blood Gluc Sensor (FREESTYLE LIBRE 14 DAY SENSOR) MISC APPLY EVERY 14 DAYS    . HUMALOG KWIKPEN 100 UNIT/ML KwikPen Inject 0-10 Units into the skin daily as needed (blood sugar).     Marland Kitchen HYDROcodone-Acetaminophen 5-300 MG TABS Take 0.5-1 tablets by mouth every 12 (twelve) hours as needed. 40 tablet 0  . Melatonin 3 MG CAPS Take 1 capsule (3 mg total) by mouth at bedtime as needed (sleep). 30 capsule 0  . metFORMIN (GLUCOPHAGE) 1000 MG tablet TAKE 1 TABLET (1,000 MG TOTAL) BY MOUTH 2 (TWO) TIMES DAILY WITH A MEAL. 180 tablet 1  . methocarbamol (ROBAXIN) 750 MG tablet Take 1 tablet (750 mg total) by mouth 3 (three) times daily as needed for muscle spasms. 90 tablet 1  . metoCLOPramide (REGLAN) 10 MG tablet Take 1 tablet (10 mg total) by mouth every 8 (eight) hours as needed for nausea. 20 tablet 0  . ondansetron (ZOFRAN-ODT) 4 MG disintegrating tablet Take 1 tablet (4 mg total) by mouth every 6 (six) hours as needed for nausea or vomiting. 20 tablet 0  . ONETOUCH VERIO test strip USE TO CHECK BLOOD SUGAR 3 TIMES A DAY    . oxycodone (OXY-IR) 5 MG capsule Take 5 mg by mouth 2 (two) times daily.    . pantoprazole (PROTONIX) 40 MG tablet TAKE 1 TABLET (40 MG TOTAL) BY MOUTH DAILY AT 6 (SIX) AM. 90 tablet 1  . polyethylene glycol (MIRALAX / GLYCOLAX) 17 g packet Take 17 g by mouth daily. (Patient taking differently: Take 17 g by  mouth daily as needed for mild constipation. ) 14 each 0  . pomalidomide (POMALYST) 2 MG capsule Take 1 capsule (2 mg total) by mouth daily. 21 capsule 0  . potassium chloride (KLOR-CON) 20 MEQ packet EVERY DAY 90 packet 1  . senna-docusate (SENOKOT-S) 8.6-50 MG tablet Take 1 tablet by mouth 2 (two) times daily. (Patient taking differently: Take 1 tablet by mouth 2 (two) times daily as needed for mild constipation. )    . magnesium oxide (MAG-OX) 400 (241.3 Mg) MG tablet Take 1 tablet (400 mg total) by mouth daily. 30 tablet 0   No current facility-administered medications for this visit.    PHYSICAL EXAMINATION: ECOG PERFORMANCE STATUS: 1-2  Vitals:   07/15/19 1446  BP: (!) 125/94  Pulse: 96  Resp: 18  Temp: 98.2 F (36.8 C)  SpO2: 100%   Filed Weights   07/15/19 1446  Weight: 208 lb 14.4 oz (94.8 kg)    GENERAL:alert, no distress and comfortable SKIN: no rash.  EYES:  sclera clear OROPHARYNX: no thrush, ulcers, gingival erythema or drainage  NECK: without mass  LYMPH:  no palpable lymphadenopathy in the cervical or supraclavicular area LUNGS: clear with normal breathing effort HEART: regular rate & rhythm, no lower extremity edema ABDOMEN: abdomen soft, non-tender and normal bowel sounds. No hepatomegaly  NEURO: alert & oriented x 3 with fluent speech, steady gait with assistive device  PAC removed   LABORATORY DATA:  I have reviewed the data as listed CBC Latest Ref Rng & Units 07/15/2019 05/29/2019 04/05/2019  WBC 4.0 - 10.5 K/uL 5.1 2.3(L) 3.8(L)  Hemoglobin 13.0 - 17.0 g/dL 12.0(L) 10.3(L) 11.5(L)  Hematocrit 39.0 - 52.0 % 36.3(L) 30.9(L) 33.8(L)  Platelets 150 - 400 K/uL 256 322 317     CMP Latest Ref Rng & Units 07/15/2019 05/29/2019 04/05/2019  Glucose 70 - 99 mg/dL 95 151(H) 197(H)  BUN 6 - 20 mg/dL '10 10 12  ' Creatinine 0.61 - 1.24 mg/dL 0.86 0.85 0.90  Sodium 135 - 145 mmol/L 144 141 139  Potassium 3.5 - 5.1 mmol/L 3.6 4.0 4.0  Chloride 98 - 111 mmol/L 108  107 107  CO2 22 - 32 mmol/L 24 23 21(L)  Calcium 8.9 - 10.3 mg/dL 9.4 9.0 9.3  Total Protein 6.5 - 8.1 g/dL 6.8 7.1 8.0  Total Bilirubin 0.3 - 1.2 mg/dL 0.3 <0.2(L) 0.4  Alkaline Phos 38 - 126 U/L 64 73 82  AST 15 - 41 U/L 13(L) 17 11(L)  ALT 0 - 44 U/L <'6 15 12      ' RADIOGRAPHIC STUDIES: I have personally reviewed the radiological images as listed and agreed with the findings in the report. No results found.   ASSESSMENT & PLAN: Edward Todd is a 44 y.o. male with   1. 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 Work upconfirmed multiple myeloma, unfortunately he has multiple compression fracture of thoracic and lumbar spinefrom this. -Started induction chemo with XBD:ZHGDJM Velcade injections with Dexamethasonestartedon 09/05/18 and oral Revlimid on 09/14/18. -His cytogeneticsand FISHresults showed trisomy 11, FISH panel otherwise negative, this is considered a standard risk.  -Hewason first line chemo VRD,butdue to poor tolerance toRevlimid, stopped it in August 2020. -He met with Dr. Norma Fredrickson and [facility] transplant team  -He was changed to second-line weekly Faspo injection, Dexa and Pomalyst38m 3 weeks on/1 week off. He continues Zometa monthly, held since 01/2019 due to pending tooth extraction and SCT  -M-proteintrended down; latest Bone marrow biopsy showed plasma cell neoplasm is down to 10% (was 95% on initial biopsy) , overall great partial response.  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  -Mr. RKoelzerappears well today, just over 2 months post transplant. Strength continues to improve. No evidence of complications or infection at this point. He continues bactrim and acyclovir for prophylaxis.  -we reviewed his CBC and CMP from today. MM panel is pending. Mg is mildly low 1.6. I recommend to  begin oral mag-ox once daily.  [facility] -[place]  2. Anemia, secondary to MM -He required blood  transfusion 09/12/18.On Oral iron now.  -Has improved on treatment.he has recovered well since BMT -Hgb 12.0 today  3. Severe low back pain, Headaches -Back painsecondary to thoracic and lumbar compression fractures,lumbar spine stenosisseen on 08/31/18 bone survey.  -He has completed rehab and PT. Strength and conditioning has improved  -Hepreviouslystopped Oxycodone due to fear of Hallucinations.He is on Robaxin 792mand hydrocodonehalf tabletBID and Tylenol 10071mq6hours for HA.  -His back and leg pain have improved, less severe lately. He takes pain medication sparingly.  -last Zometa 01/2019  -Continue tylenol for mild to moderate pain, hydrocodone for severe pain. Hopefully will be able to wean him off opioids soon.  4. DM, Type II, hyperglycemia -On Metformin BIDand sliding scale insulin. -He is being seen by Dr. ElLoanne DrillingReduced oral dexa in the past, no longer on steroids  -BG 160 at home recently, normal today -He will continue calcium BID and Vitamin D 1000 units.  5. Financial support -Approved for Medicaid.   6. Dental pain  -from cracked wisdom tooth -last zometa 02/01/2019 -will defer to transplant team on timing for tooth extraction, no signs of oral abscess. WBC and plt normal today. He will call transplant coordinator for guidance   PLAN: -Labs reviewed, MM panel pending  -Defer to [facility] transplant team re: tooth extraction but OK from CBC position  -Continue bactrim and acyclovir  -Continue nutrition, strengthening -F/u [facility] around 4/20 for D+100 work up  -F/u with Dr. FeBurr Medico/23 -New Rx: Mag-ox once daily   No problem-specific Assessment & Plan notes found for this encounter.   No orders of the defined types were placed in this encounter.  All questions were answered.  The patient knows to call the clinic with any problems, questions or concerns. No barriers to learning was detected.     LaAlla FeelingNP 07/15/19

## 2019-07-16 ENCOUNTER — Telehealth: Payer: Self-pay | Admitting: Nurse Practitioner

## 2019-07-16 LAB — KAPPA/LAMBDA LIGHT CHAINS
Kappa free light chain: 5.6 mg/L (ref 3.3–19.4)
Kappa, lambda light chain ratio: 1.65 (ref 0.26–1.65)
Lambda free light chains: 3.4 mg/L — ABNORMAL LOW (ref 5.7–26.3)

## 2019-07-16 NOTE — Telephone Encounter (Signed)
Scheduled appt  Per 3/22 los.  Spoke with pt and he is aware of the scheduled appt date and time.

## 2019-07-17 ENCOUNTER — Telehealth: Payer: Self-pay

## 2019-07-17 LAB — MULTIPLE MYELOMA PANEL, SERUM
Albumin SerPl Elph-Mcnc: 3.9 g/dL (ref 2.9–4.4)
Albumin/Glob SerPl: 1.7 (ref 0.7–1.7)
Alpha 1: 0.2 g/dL (ref 0.0–0.4)
Alpha2 Glob SerPl Elph-Mcnc: 0.8 g/dL (ref 0.4–1.0)
B-Globulin SerPl Elph-Mcnc: 1.2 g/dL (ref 0.7–1.3)
Gamma Glob SerPl Elph-Mcnc: 0.2 g/dL — ABNORMAL LOW (ref 0.4–1.8)
Globulin, Total: 2.3 g/dL (ref 2.2–3.9)
IgA: 33 mg/dL — ABNORMAL LOW (ref 90–386)
IgG (Immunoglobin G), Serum: 650 mg/dL (ref 603–1613)
IgM (Immunoglobulin M), Srm: 20 mg/dL (ref 20–172)
M Protein SerPl Elph-Mcnc: 0.4 g/dL — ABNORMAL HIGH
Total Protein ELP: 6.2 g/dL (ref 6.0–8.5)

## 2019-07-17 NOTE — Telephone Encounter (Signed)
Lab results and ov note from 07/15/2019 faxed to [facility] attn Gery Pray

## 2019-07-19 NOTE — Progress Notes (Signed)
Counseling intern called patient on 06/24/19 to check-in and ask about scheduling another counseling session. Pt did not answer and CI left a voicemail message encouraging pt to call to schedule a telehealth counseling session. CI will call pt again on 07/01/19 if no call back is received.   Art Buff Douglas Counseling Intern Voicemail:  [phone]

## 2019-07-19 NOTE — Progress Notes (Signed)
Counseling intern spoke to pt on 07/12/2019 for a telehealth counseling session via phone. Counseling intern provided space for pt to open up about experiences and responded with empathy, compassion, and normalization of feelings. Pt presented to counseling in an upbeat mood.  Pt reported he just got back from a doctor appt and that he feel relieved that his present levels look good for the most part. Pt reported that he still experiences fatigue after just 10 minutes of PT. He reported that his appetite is better, but that sleeping is difficult because of codeine withdrawal symptoms. Pt also expressed he has aspirations about what he will do once his tx is completed, which includes going to [place] to visit family and friends.    Pt and CI discussed the challenge of being aware and OK with the difference in his present self, who is weak, physically smaller, and needs support compared to the self he remembers before his cancer who was big, strong, and independent. The pt compared his present self to "a pebble" and his past self as "The [employer] Pt and CI discussed the importance of noticing progress, even when it is slow and minimal. CI also discussed the withdrawal process and normalized his experience. Pt stated that he is not overly stressed because he knows he is seeing progress, but that he continues to use meditation, thought replacement, and watching comedy as strategies for shifting his thinking and his mood.   Pt stated he does want to schedule another counseling session and CI will follow-up to determine a time and date.  Next Counseling Session: TBD   Edward Todd [facility] Counseling Intern Voicemail:  [phone]

## 2019-07-22 DIAGNOSIS — C9000 Multiple myeloma not having achieved remission: Secondary | ICD-10-CM | POA: Diagnosis not present

## 2019-07-23 LAB — UIFE/LIGHT CHAINS/TP QN, 24-HR UR
FR KAPPA LT CH,24HR: 7.93 mg/24 hr
FR LAMBDA LT CH,24HR: <1.44 mg/24 hr
Free Kappa Lt Chains,Ur: 3.87 mg/L (ref 0.63–113.79)
Free Kappa/Lambda Ratio: >5.53 (ref 1.03–31.76)
Free Lambda Lt Chains,Ur: <0.7 mg/L (ref 0.47–11.77)
Total Protein, Urine-Ur/day: 215 mg/24 hr — ABNORMAL HIGH (ref 30–150)
Total Protein, Urine: 10.5 mg/dL
Total Volume: 2050

## 2019-07-24 NOTE — Progress Notes (Signed)
Counseling Intern called patient on 07/24/19 to check-in. Pt did not answer and CI left a voicemail message encouraging pt to call to schedule a counseling session if needed. CI will call again next week to check-in with pt.  Art Buff [place] Counseling Intern Voicemail:  [phone]

## 2019-08-12 ENCOUNTER — Other Ambulatory Visit: Payer: Self-pay | Admitting: Hematology

## 2019-08-12 DIAGNOSIS — C9000 Multiple myeloma not having achieved remission: Secondary | ICD-10-CM

## 2019-08-16 ENCOUNTER — Telehealth: Payer: Self-pay

## 2019-08-16 ENCOUNTER — Inpatient Hospital Stay: Payer: Medicaid Other | Admitting: Hematology

## 2019-08-16 ENCOUNTER — Inpatient Hospital Stay: Payer: Medicaid Other

## 2019-08-16 ENCOUNTER — Other Ambulatory Visit: Payer: Self-pay | Admitting: Nurse Practitioner

## 2019-08-16 NOTE — Telephone Encounter (Signed)
Mr Edward Todd returned my vm.  Per Dr. Burr Medico he was seen at [facility] last week with labs and doesn't need to come here today.  He stated he has an appt at [facility] on 4/28 to discuss maintenance therapy.  He will contact us after that to schedule appts.

## 2019-08-23 NOTE — Progress Notes (Signed)
Counseling intern called patient on 08/23/19 to remind pt that this is the CI's last day at [facility] and to wish him well in his recovery. Reminded pt of the other support services available through [facility] and encouraged him to call for more information.  Art Buff Gilbert Counseling Intern Voicemail:  [phone]

## 2020-08-28 ENCOUNTER — Telehealth: Payer: Self-pay | Admitting: Hematology

## 2020-08-28 NOTE — Telephone Encounter (Signed)
Scheduled appt per 5/6 sch msg. Pt aware.  

## 2020-09-02 ENCOUNTER — Telehealth: Payer: Self-pay | Admitting: Hematology

## 2020-09-02 NOTE — Telephone Encounter (Signed)
Called pt to sch appt per 5/11 sch msg. No answer. Left msg for pt to call back to sch appt.

## 2020-09-03 ENCOUNTER — Inpatient Hospital Stay: Payer: Medicaid Other | Admitting: Hematology

## 2020-12-23 IMAGING — DX METASTATIC BONE SURVEY
9 of 10 series · 9 of 10 positions shown · non-contrast
Comparison: None.

CLINICAL DATA: Low back pain, history of multiple myeloma

EXAM:
METASTATIC BONE SURVEY

[skull lat]
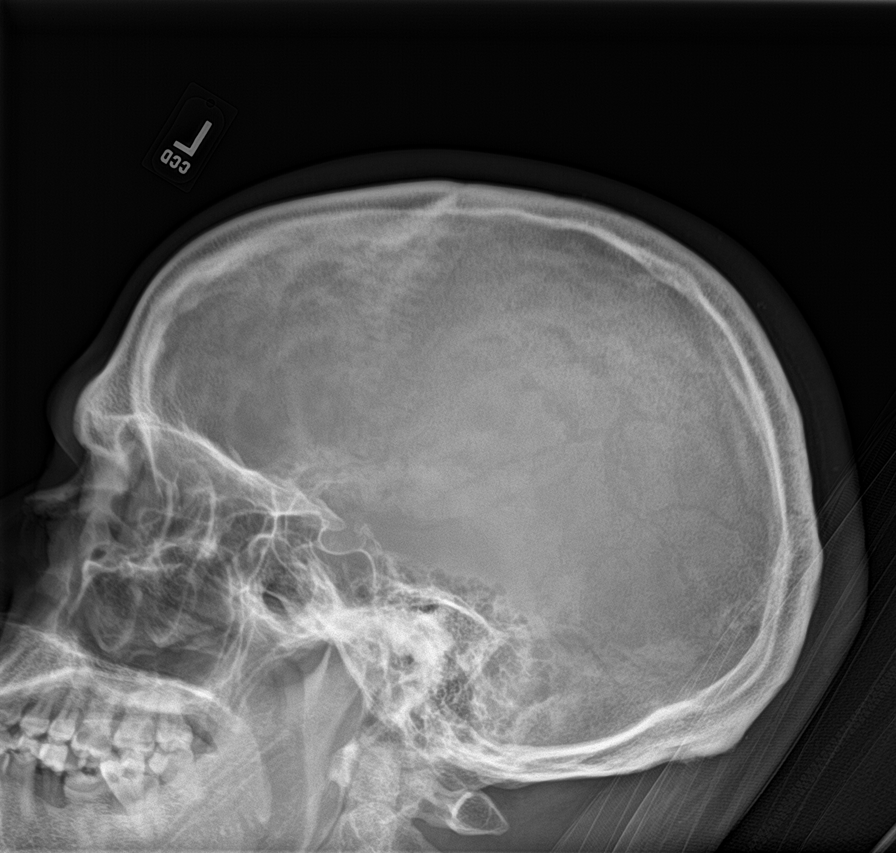

[shoulder ap (1 of 2)]
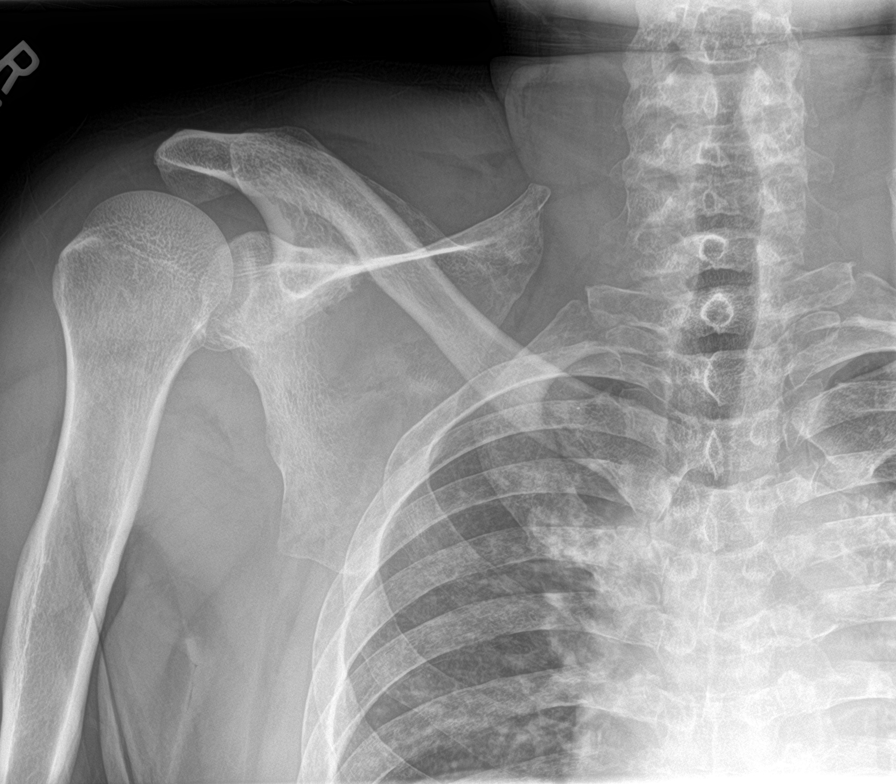

[shoulder ap (2 of 2)]
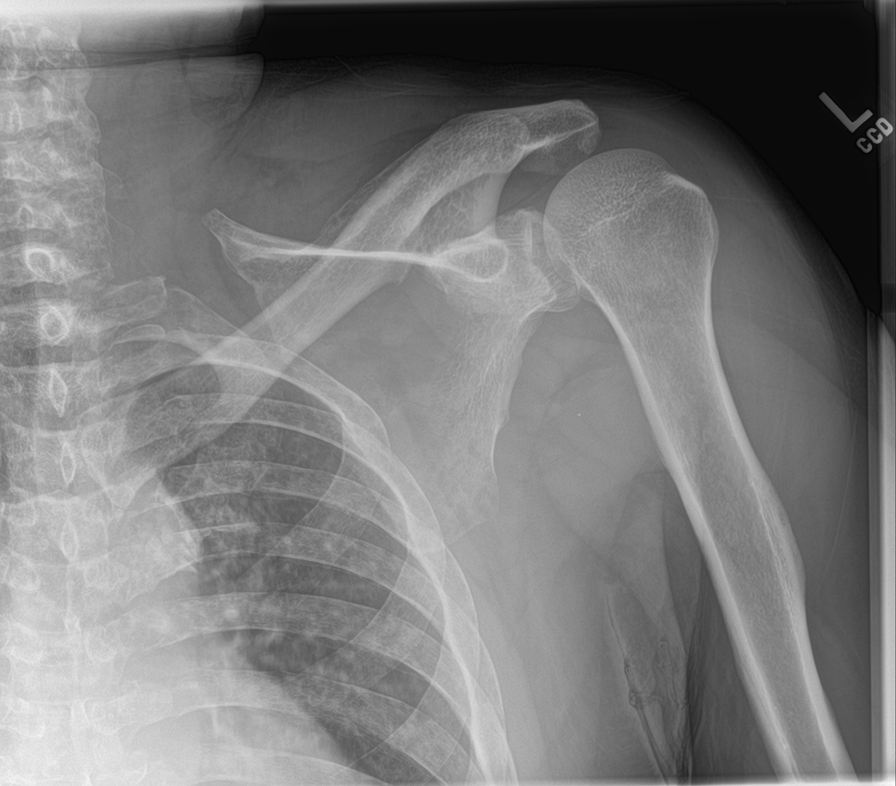

[humerus ap (1 of 2)]
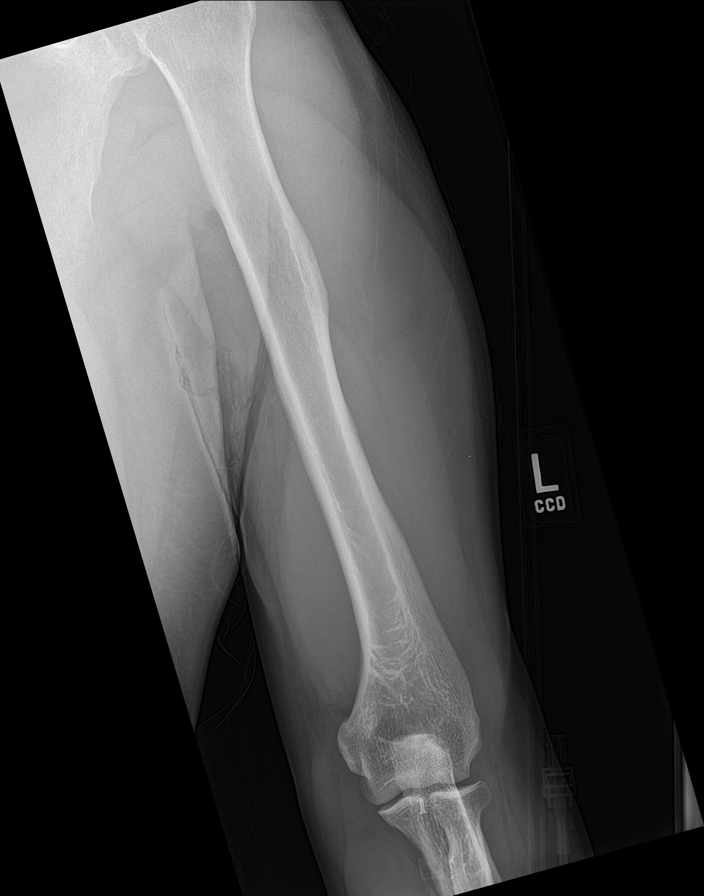

[humerus ap (2 of 2)]
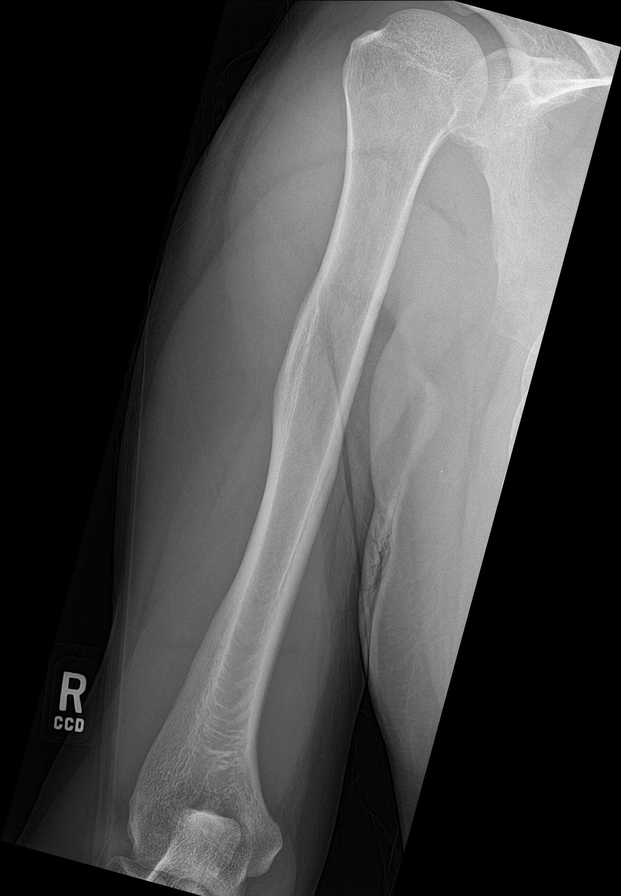

[forearm ap (1 of 2)]
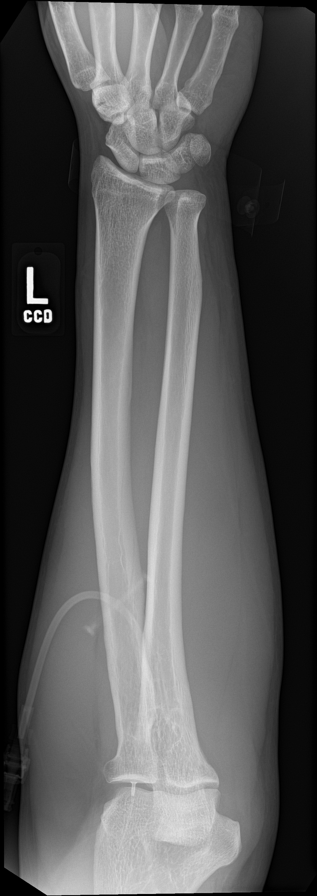

[forearm ap (2 of 2)]
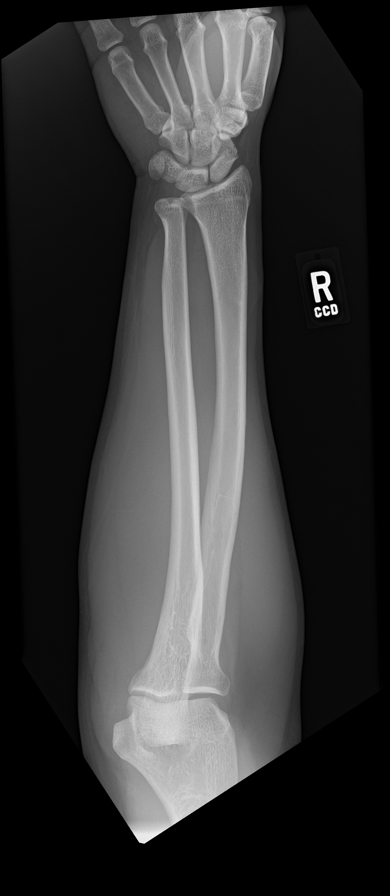

[c-spine ap]
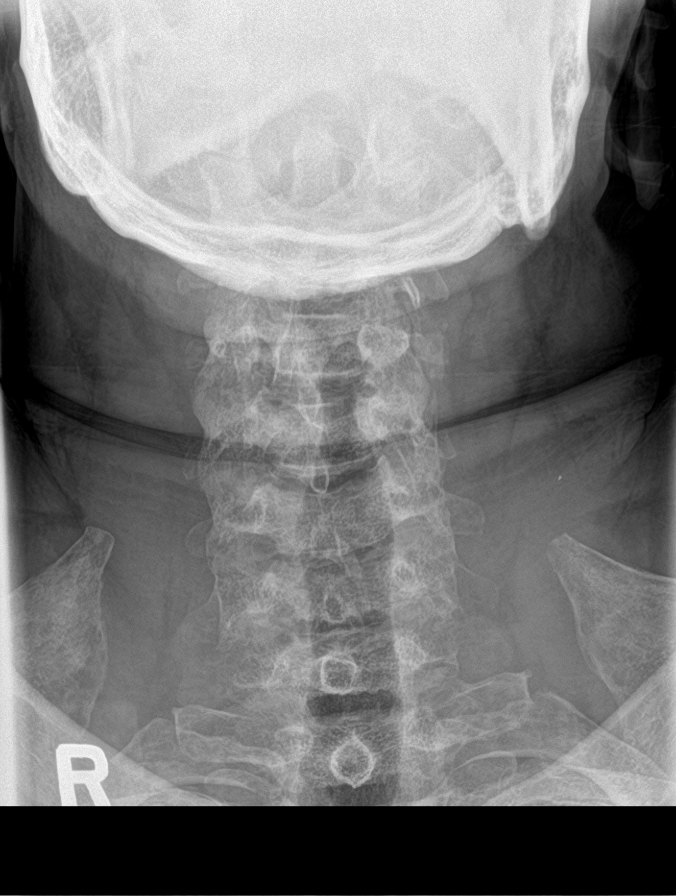

[c-spine lat]
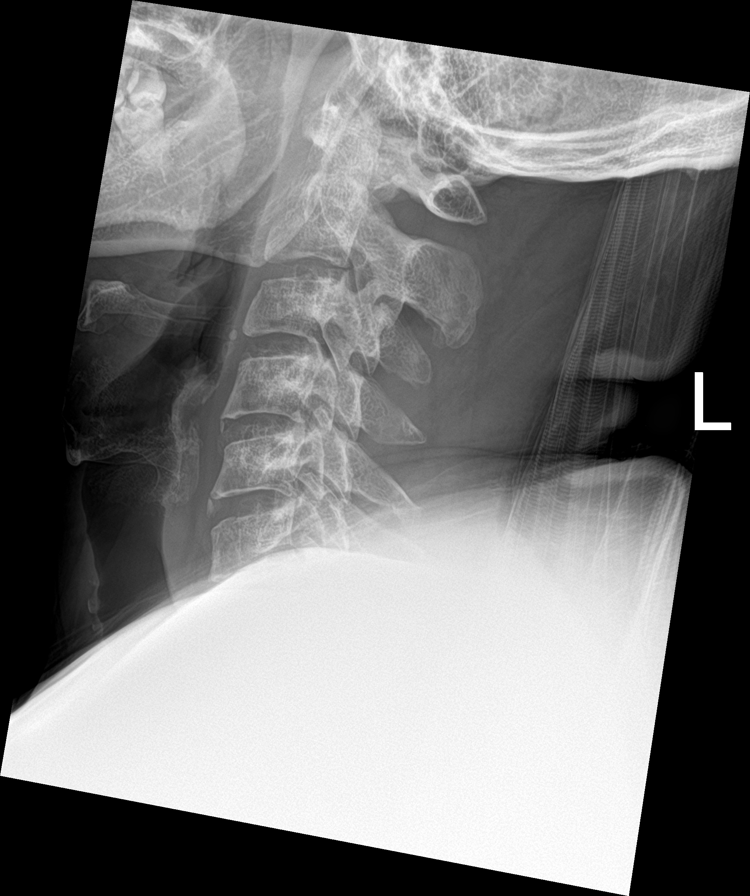

[9 of 10 positions shown; findings below may reference images not displayed]

FINDINGS: Small lucencies within the scapula bilaterally as can be seen with
multiple myeloma. No other aggressive lytic or sclerotic osseous
lesion. No periosteal reaction or bone destruction. No acute osseous
abnormality.

Chronic L1, L2 and L4 vertebral body compression fractures.
Age-indeterminate T10, T9, T8, T6 vertebral body compression
fractures.
IMPRESSION: Small lucencies within the scapula bilaterally as can be seen with
multiple myeloma.

Age-indeterminate T6, T8, T9 and T10 vertebral body compression
fractures.

## 2021-07-21 ENCOUNTER — Encounter: Payer: Self-pay | Admitting: Hematology

## 2021-07-28 ENCOUNTER — Ambulatory Visit (INDEPENDENT_AMBULATORY_CARE_PROVIDER_SITE_OTHER): Payer: 59 | Admitting: Orthopaedic Surgery

## 2021-07-28 ENCOUNTER — Encounter: Payer: Self-pay | Admitting: Orthopaedic Surgery

## 2021-07-28 VITALS — BP 128/78 | HR 80 | Ht 76.0 in | Wt 243.0 lb

## 2021-07-28 DIAGNOSIS — S22000D Wedge compression fracture of unspecified thoracic vertebra, subsequent encounter for fracture with routine healing: Secondary | ICD-10-CM

## 2021-07-28 NOTE — Progress Notes (Signed)
? ?Office Visit Note ?  ?Patient: Edward Todd           ?Date of Birth: Jan 27, 1976           ?MRN: [id number] ?Visit Date: 07/28/2021 ?             ?Requested by: London Pepper, MD ?[address] ?[address] ?[place],  [place] [address] ?PCP: London Pepper, MD ? ? ?Assessment & Plan: ?Visit Diagnoses:  ?1. Compression fracture of thoracic vertebra with routine healing, unspecified thoracic vertebral level, subsequent encounter   ? ? ?Plan: Patient is in remission and has no pain in the spine and has multiple compressions as outlined in the MRI report below for both thoracic spine multiple and also L2 compression fracture lumbar.  No fractures are acute.  I recommend he continue on a walking program which he is able to do now that he is feeling better. ? ?Follow-Up Instructions: Return if symptoms worsen or fail to improve.  ? ?Orders:  ?No orders of the defined types were placed in this encounter. ? ?No orders of the defined types were placed in this encounter. ? ? ? ? Procedures: ?No procedures performed ? ? ?Clinical Data: ?No additional findings. ? ? ?Subjective: ?Chief Complaint  ?Patient presents with  ? Middle Back - Follow-up  ? Lower Back - Follow-up  ? ? ?HPI 46 year old male multiple myeloma.  He has had MRI scan showing multiple compression fractures related with his multiple myeloma.  He has been through treatment and MRI scan shows no acute or subacute compression fractures.  He states his back does not bother him since his treatments his pain is significantly better.  Myeloma was diagnosed in May 2020.  Patient also has some type 2 diabetes. ? ?Review of Systems all other systems are noncontributory to HPI. ? ? ?Objective: ?Vital Signs: BP 128/78   Pulse 80   Ht '6\' 4"'  (1.93 m)   Wt 243 lb (110.2 kg)   BMI 29.58 kg/m?  ? ?Physical Exam ?Constitutional:   ?   Appearance: He is well-developed.  ?HENT:  ?   Head: Normocephalic and atraumatic.  ?   Right Ear: External ear normal.  ?   Left Ear:  External ear normal.  ?Eyes:  ?   Pupils: Pupils are equal, round, and reactive to light.  ?Neck:  ?   Thyroid: No thyromegaly.  ?   Trachea: No tracheal deviation.  ?Cardiovascular:  ?   Rate and Rhythm: Normal rate.  ?Pulmonary:  ?   Effort: Pulmonary effort is normal.  ?   Breath sounds: No wheezing.  ?Abdominal:  ?   General: Bowel sounds are normal.  ?   Palpations: Abdomen is soft.  ?Musculoskeletal:  ?   Cervical back: Neck supple.  ?Skin: ?   General: Skin is warm and dry.  ?   Capillary Refill: Capillary refill takes less than 2 seconds.  ?Neurological:  ?   Mental Status: He is alert and oriented to person, place, and time.  ?Psychiatric:     ?   Behavior: Behavior normal.     ?   Thought Content: Thought content normal.     ?   Judgment: Judgment normal.  ? ? ?Ortho Exam patient gets from sitting standing is amatory.  No pain with bending flexion or extension lumbar thoracic spine. ? ?Specialty Comments:  ?No specialty comments available. ? ?Imaging: ?CLINICAL DATA:  Initial evaluation for compression fracture. History  ?of multiple myeloma.  ? ?  EXAM:  ?MRI THORACIC AND LUMBAR SPINE WITHOUT CONTRAST  ? ?TECHNIQUE:  ?Multiplanar and multiecho pulse sequences of the thoracic and lumbar  ?spine were obtained without intravenous contrast.  ? ?COMPARISON:  Comparison made with previous MRI from 09/01/2018.  ? ?FINDINGS:  ?MRI THORACIC SPINE FINDINGS  ? ?Alignment: Mild exaggeration of the normal thoracic kyphosis. No  ?listhesis.  ? ?Vertebrae: Heterogeneous signal intensity seen within the visualized  ?bone marrow, presumably related to history of multiple myeloma.  ? ?Multiple chronic compression fractures again seen throughout the  ?thoracic spine. These involve the superior endplates of T3, T4, T5,  ?and T6 through T10, and likely T12. The T4, T5, and T12 fractures  ?appear new as compared to prior MRI. Height loss is greatest at the  ?T8 through T10 levels, measuring up to approximately 50%. No bony   ?retropulsion. These fractures are all chronic in appearance, with no  ?acute or subacute fracture visible.  ? ?Cord: Normal signal and morphology. Mild dorsal epidural lipomatosis  ?within the midthoracic spine.  ? ?Paraspinal and other soft tissues: Paraspinous soft tissues within  ?normal limits. Benign appearing T2 hyperintense exophytic cyst  ?partially visualized within the left kidney, no follow-up imaging  ?recommended. Visualized visceral structures otherwise unremarkable.  ? ?Disc levels:  ? ?Minor for age noncompressive disc bulging present at T8-9 through  ?T11-12. Mild lower thoracic facet hypertrophy. No significant spinal  ?stenosis. Mild bilateral foraminal narrowing at T9-10 and T10-11,  ?similar to previous.  ? ?MRI LUMBAR SPINE FINDINGS  ? ?Segmentation: Standard. Lowest well-formed disc space labeled the  ?L5-S1 level.  ? ?Alignment: Straightening of the normal lumbar lordosis. No  ?listhesis.  ? ?Vertebrae: Heterogeneous signal intensity seen throughout the  ?visualized bone marrow, presumably related to history of multiple  ?myeloma. Chronic compression fracture involving the superior  ?endplate of L4, stable. There are additional mild compression  ?fractures involving the L1, L2, and L3 vertebral bodies, new and/or  ?mildly progressed from prior, although also chronic in appearance.  ?Height loss is maximal at the L2 level measuring up to 40-50%. No  ?significant bony retropulsion. No acute or subacute compression  ?fracture. No abnormal marrow edema.  ? ?Conus medullaris and cauda equina: Conus extends to the L1 level.  ?Conus and cauda equina appear normal.  ? ?Paraspinal and other soft tissues: Paraspinous soft tissues within  ?normal limits. Tiny subcentimeter T2 hyperintense cyst noted within  ?the interpolar right kidney, benign in appearance, no follow-up  ?imaging recommended. Visualized visceral structures otherwise  ?unremarkable.  ? ?Disc levels:  ? ?Mild diffuse congenital  shortening of the pedicles noted.  ? ?L1-2: Minimal annular disc bulge. Mild facet hypertrophy. Mild  ?epidural lipomatosis. No significant spinal stenosis. Foramina  ?remain patent.  ? ?L2-3: Mild disc bulge. Mild facet and ligament flavum hypertrophy.  ?Epidural lipomatosis with underlying short pedicles. Resultant mild  ?spinal stenosis. Foramina remain patent.  ? ?L3-4: Mild diffuse disc bulge with disc desiccation. Mild facet and  ?ligament flavum hypertrophy. Epidural lipomatosis with short  ?pedicles. Resultant moderate spinal stenosis. Mild right greater  ?than left L3 foraminal narrowing.  ? ?L4-5: Mild diffuse disc bulge. Superimposed broad-based left  ?foraminal to extraforaminal disc protrusion with annular fissure  ?closely approximates the exiting left L4 nerve root (series 5, image  ?29). Mild to moderate facet hypertrophy. Epidural lipomatosis with  ?underlying short pedicles. Resultant severe spinal stenosis. Mild  ?bilateral L4 foraminal narrowing.  ? ?L5-S1: Small central disc protrusion minimally indents the ventral  ?  thecal sac (series 5, image 37). Mild facet and ligament flavum  ?hypertrophy. Epidural lipomatosis. No significant spinal stenosis.  ?Foramina remain patent.  ? ?IMPRESSION:  ?MR THORACIC SPINE IMPRESSION  ? ?1. Multiple chronic compression fractures T3 through T10 vertebral  ?bodies, as well as the T12 vertebral body. Height loss is greatest  ?at the T8 through T10 levels measuring up to approximately 50%. No  ?associated bony retropulsion or stenosis. No acute or subacute  ?fracture within the thoracic spine.  ?2. Underlying mild for age degenerative spondylosis without  ?significant spinal stenosis. Mild bilateral foraminal narrowing at  ?T9-10 and T10-11.  ? ?MR LUMBAR SPINE IMPRESSION  ? ?1. Multiple chronic compression fractures involving the L1 through  ?L4 vertebral bodies. Height loss is maximal at the level of L2 with  ?up to 40-50% height loss. No significant bony  retropulsion. No acute  ?or subacute fracture within the lumbar spine.  ?2. 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  ?3. Superimposed left foraminal to extrafora

## 2021-08-03 ENCOUNTER — Telehealth: Payer: Self-pay

## 2021-08-03 DIAGNOSIS — C9000 Multiple myeloma not having achieved remission: Secondary | ICD-10-CM

## 2021-08-03 NOTE — Telephone Encounter (Signed)
Pt called requesting referral of care be sent to [facility] Hematology Team.  Faxed referral, pt demographics, and Cira Rue, NP last office note to the Multiple Myeloma Team - New Patient Referral at [phone]  T [phone].  Fax confirmation received. ?

## 2021-11-15 ENCOUNTER — Other Ambulatory Visit: Payer: Self-pay

## 2022-08-05 DIAGNOSIS — G890 Central pain syndrome: Secondary | ICD-10-CM | POA: Diagnosis not present

## 2022-08-05 DIAGNOSIS — M545 Low back pain, unspecified: Secondary | ICD-10-CM | POA: Diagnosis not present

## 2022-08-05 DIAGNOSIS — M25519 Pain in unspecified shoulder: Secondary | ICD-10-CM | POA: Diagnosis not present

## 2022-08-05 DIAGNOSIS — C9001 Multiple myeloma in remission: Secondary | ICD-10-CM | POA: Diagnosis not present

## 2022-08-05 DIAGNOSIS — G894 Chronic pain syndrome: Secondary | ICD-10-CM | POA: Diagnosis not present

## 2022-08-05 DIAGNOSIS — M5412 Radiculopathy, cervical region: Secondary | ICD-10-CM | POA: Diagnosis not present

## 2022-08-05 DIAGNOSIS — M542 Cervicalgia: Secondary | ICD-10-CM | POA: Diagnosis not present

## 2022-08-05 DIAGNOSIS — Z79891 Long term (current) use of opiate analgesic: Secondary | ICD-10-CM | POA: Diagnosis not present

## 2022-08-05 DIAGNOSIS — M546 Pain in thoracic spine: Secondary | ICD-10-CM | POA: Diagnosis not present

## 2022-08-08 DIAGNOSIS — E785 Hyperlipidemia, unspecified: Secondary | ICD-10-CM | POA: Diagnosis not present

## 2022-08-08 DIAGNOSIS — C9000 Multiple myeloma not having achieved remission: Secondary | ICD-10-CM | POA: Diagnosis not present

## 2022-08-08 DIAGNOSIS — E1165 Type 2 diabetes mellitus with hyperglycemia: Secondary | ICD-10-CM | POA: Diagnosis not present

## 2022-08-23 DIAGNOSIS — S22000A Wedge compression fracture of unspecified thoracic vertebra, initial encounter for closed fracture: Secondary | ICD-10-CM | POA: Diagnosis not present

## 2022-08-23 DIAGNOSIS — C9001 Multiple myeloma in remission: Secondary | ICD-10-CM | POA: Diagnosis not present

## 2022-08-23 DIAGNOSIS — Z006 Encounter for examination for normal comparison and control in clinical research program: Secondary | ICD-10-CM | POA: Diagnosis not present

## 2022-08-23 DIAGNOSIS — Z5111 Encounter for antineoplastic chemotherapy: Secondary | ICD-10-CM | POA: Diagnosis not present

## 2022-09-02 DIAGNOSIS — M546 Pain in thoracic spine: Secondary | ICD-10-CM | POA: Diagnosis not present

## 2022-09-02 DIAGNOSIS — G894 Chronic pain syndrome: Secondary | ICD-10-CM | POA: Diagnosis not present

## 2022-09-02 DIAGNOSIS — M542 Cervicalgia: Secondary | ICD-10-CM | POA: Diagnosis not present

## 2022-09-02 DIAGNOSIS — M25519 Pain in unspecified shoulder: Secondary | ICD-10-CM | POA: Diagnosis not present

## 2022-09-02 DIAGNOSIS — G890 Central pain syndrome: Secondary | ICD-10-CM | POA: Diagnosis not present

## 2022-09-02 DIAGNOSIS — Z79891 Long term (current) use of opiate analgesic: Secondary | ICD-10-CM | POA: Diagnosis not present

## 2022-09-02 DIAGNOSIS — C9001 Multiple myeloma in remission: Secondary | ICD-10-CM | POA: Diagnosis not present

## 2022-09-02 DIAGNOSIS — M5412 Radiculopathy, cervical region: Secondary | ICD-10-CM | POA: Diagnosis not present

## 2022-09-02 DIAGNOSIS — M545 Low back pain, unspecified: Secondary | ICD-10-CM | POA: Diagnosis not present

## 2022-09-07 DIAGNOSIS — H524 Presbyopia: Secondary | ICD-10-CM | POA: Diagnosis not present

## 2022-09-07 DIAGNOSIS — H52209 Unspecified astigmatism, unspecified eye: Secondary | ICD-10-CM | POA: Diagnosis not present

## 2022-09-07 DIAGNOSIS — H5213 Myopia, bilateral: Secondary | ICD-10-CM | POA: Diagnosis not present

## 2022-09-16 DIAGNOSIS — C9001 Multiple myeloma in remission: Secondary | ICD-10-CM | POA: Diagnosis not present

## 2022-09-20 DIAGNOSIS — J309 Allergic rhinitis, unspecified: Secondary | ICD-10-CM | POA: Diagnosis not present

## 2022-09-23 DIAGNOSIS — U071 COVID-19: Secondary | ICD-10-CM | POA: Diagnosis not present

## 2022-09-30 DIAGNOSIS — M542 Cervicalgia: Secondary | ICD-10-CM | POA: Diagnosis not present

## 2022-09-30 DIAGNOSIS — M25519 Pain in unspecified shoulder: Secondary | ICD-10-CM | POA: Diagnosis not present

## 2022-09-30 DIAGNOSIS — G890 Central pain syndrome: Secondary | ICD-10-CM | POA: Diagnosis not present

## 2022-09-30 DIAGNOSIS — G894 Chronic pain syndrome: Secondary | ICD-10-CM | POA: Diagnosis not present

## 2022-09-30 DIAGNOSIS — M5412 Radiculopathy, cervical region: Secondary | ICD-10-CM | POA: Diagnosis not present

## 2022-09-30 DIAGNOSIS — M546 Pain in thoracic spine: Secondary | ICD-10-CM | POA: Diagnosis not present

## 2022-09-30 DIAGNOSIS — Z79891 Long term (current) use of opiate analgesic: Secondary | ICD-10-CM | POA: Diagnosis not present

## 2022-09-30 DIAGNOSIS — Z79899 Other long term (current) drug therapy: Secondary | ICD-10-CM | POA: Diagnosis not present

## 2022-09-30 DIAGNOSIS — M545 Low back pain, unspecified: Secondary | ICD-10-CM | POA: Diagnosis not present

## 2022-09-30 DIAGNOSIS — C9001 Multiple myeloma in remission: Secondary | ICD-10-CM | POA: Diagnosis not present

## 2022-10-05 DIAGNOSIS — J0140 Acute pansinusitis, unspecified: Secondary | ICD-10-CM | POA: Diagnosis not present

## 2022-10-05 DIAGNOSIS — U099 Post covid-19 condition, unspecified: Secondary | ICD-10-CM | POA: Diagnosis not present

## 2022-10-11 DIAGNOSIS — Z5111 Encounter for antineoplastic chemotherapy: Secondary | ICD-10-CM | POA: Diagnosis not present

## 2022-10-11 DIAGNOSIS — C9001 Multiple myeloma in remission: Secondary | ICD-10-CM | POA: Diagnosis not present

## 2022-10-11 DIAGNOSIS — S22000A Wedge compression fracture of unspecified thoracic vertebra, initial encounter for closed fracture: Secondary | ICD-10-CM | POA: Diagnosis not present

## 2022-10-11 DIAGNOSIS — Z006 Encounter for examination for normal comparison and control in clinical research program: Secondary | ICD-10-CM | POA: Diagnosis not present

## 2022-10-26 DIAGNOSIS — M545 Low back pain, unspecified: Secondary | ICD-10-CM | POA: Diagnosis not present

## 2022-10-26 DIAGNOSIS — G894 Chronic pain syndrome: Secondary | ICD-10-CM | POA: Diagnosis not present

## 2022-10-26 DIAGNOSIS — G890 Central pain syndrome: Secondary | ICD-10-CM | POA: Diagnosis not present

## 2022-10-26 DIAGNOSIS — C9001 Multiple myeloma in remission: Secondary | ICD-10-CM | POA: Diagnosis not present

## 2022-10-26 DIAGNOSIS — Z79891 Long term (current) use of opiate analgesic: Secondary | ICD-10-CM | POA: Diagnosis not present

## 2022-10-26 DIAGNOSIS — M546 Pain in thoracic spine: Secondary | ICD-10-CM | POA: Diagnosis not present

## 2022-10-26 DIAGNOSIS — Z79899 Other long term (current) drug therapy: Secondary | ICD-10-CM | POA: Diagnosis not present

## 2022-10-26 DIAGNOSIS — M25519 Pain in unspecified shoulder: Secondary | ICD-10-CM | POA: Diagnosis not present

## 2022-10-26 DIAGNOSIS — M5412 Radiculopathy, cervical region: Secondary | ICD-10-CM | POA: Diagnosis not present

## 2022-10-26 DIAGNOSIS — M542 Cervicalgia: Secondary | ICD-10-CM | POA: Diagnosis not present

## 2022-11-08 DIAGNOSIS — C9001 Multiple myeloma in remission: Secondary | ICD-10-CM | POA: Diagnosis not present

## 2022-11-08 DIAGNOSIS — Z5111 Encounter for antineoplastic chemotherapy: Secondary | ICD-10-CM | POA: Diagnosis not present

## 2022-11-08 DIAGNOSIS — Z006 Encounter for examination for normal comparison and control in clinical research program: Secondary | ICD-10-CM | POA: Diagnosis not present

## 2022-11-24 DIAGNOSIS — G890 Central pain syndrome: Secondary | ICD-10-CM | POA: Diagnosis not present

## 2022-11-24 DIAGNOSIS — G894 Chronic pain syndrome: Secondary | ICD-10-CM | POA: Diagnosis not present

## 2022-11-24 DIAGNOSIS — M5412 Radiculopathy, cervical region: Secondary | ICD-10-CM | POA: Diagnosis not present

## 2022-11-24 DIAGNOSIS — Z79899 Other long term (current) drug therapy: Secondary | ICD-10-CM | POA: Diagnosis not present

## 2022-11-24 DIAGNOSIS — M542 Cervicalgia: Secondary | ICD-10-CM | POA: Diagnosis not present

## 2022-11-24 DIAGNOSIS — M25519 Pain in unspecified shoulder: Secondary | ICD-10-CM | POA: Diagnosis not present

## 2022-11-24 DIAGNOSIS — M545 Low back pain, unspecified: Secondary | ICD-10-CM | POA: Diagnosis not present

## 2022-11-24 DIAGNOSIS — Z79891 Long term (current) use of opiate analgesic: Secondary | ICD-10-CM | POA: Diagnosis not present

## 2022-11-24 DIAGNOSIS — M546 Pain in thoracic spine: Secondary | ICD-10-CM | POA: Diagnosis not present

## 2022-11-24 DIAGNOSIS — C9001 Multiple myeloma in remission: Secondary | ICD-10-CM | POA: Diagnosis not present

## 2022-12-07 DIAGNOSIS — Z006 Encounter for examination for normal comparison and control in clinical research program: Secondary | ICD-10-CM | POA: Diagnosis not present

## 2022-12-07 DIAGNOSIS — Z9484 Stem cells transplant status: Secondary | ICD-10-CM | POA: Diagnosis not present

## 2022-12-07 DIAGNOSIS — Z5111 Encounter for antineoplastic chemotherapy: Secondary | ICD-10-CM | POA: Diagnosis not present

## 2022-12-07 DIAGNOSIS — C9001 Multiple myeloma in remission: Secondary | ICD-10-CM | POA: Diagnosis not present

## 2022-12-22 DIAGNOSIS — G894 Chronic pain syndrome: Secondary | ICD-10-CM | POA: Diagnosis not present

## 2022-12-22 DIAGNOSIS — M545 Low back pain, unspecified: Secondary | ICD-10-CM | POA: Diagnosis not present

## 2022-12-22 DIAGNOSIS — Z79891 Long term (current) use of opiate analgesic: Secondary | ICD-10-CM | POA: Diagnosis not present

## 2022-12-22 DIAGNOSIS — C9001 Multiple myeloma in remission: Secondary | ICD-10-CM | POA: Diagnosis not present

## 2022-12-22 DIAGNOSIS — M546 Pain in thoracic spine: Secondary | ICD-10-CM | POA: Diagnosis not present

## 2022-12-22 DIAGNOSIS — M5412 Radiculopathy, cervical region: Secondary | ICD-10-CM | POA: Diagnosis not present

## 2022-12-22 DIAGNOSIS — M25519 Pain in unspecified shoulder: Secondary | ICD-10-CM | POA: Diagnosis not present

## 2022-12-22 DIAGNOSIS — M542 Cervicalgia: Secondary | ICD-10-CM | POA: Diagnosis not present

## 2022-12-22 DIAGNOSIS — G890 Central pain syndrome: Secondary | ICD-10-CM | POA: Diagnosis not present

## 2023-01-04 DIAGNOSIS — S22000A Wedge compression fracture of unspecified thoracic vertebra, initial encounter for closed fracture: Secondary | ICD-10-CM | POA: Diagnosis not present

## 2023-01-04 DIAGNOSIS — C9001 Multiple myeloma in remission: Secondary | ICD-10-CM | POA: Diagnosis not present

## 2023-01-04 DIAGNOSIS — Z006 Encounter for examination for normal comparison and control in clinical research program: Secondary | ICD-10-CM | POA: Diagnosis not present

## 2023-01-04 DIAGNOSIS — Z5111 Encounter for antineoplastic chemotherapy: Secondary | ICD-10-CM | POA: Diagnosis not present

## 2023-01-10 DIAGNOSIS — C9000 Multiple myeloma not having achieved remission: Secondary | ICD-10-CM | POA: Diagnosis not present

## 2023-01-10 DIAGNOSIS — E785 Hyperlipidemia, unspecified: Secondary | ICD-10-CM | POA: Diagnosis not present

## 2023-01-10 DIAGNOSIS — E1165 Type 2 diabetes mellitus with hyperglycemia: Secondary | ICD-10-CM | POA: Diagnosis not present

## 2023-01-18 ENCOUNTER — Encounter: Payer: Self-pay | Admitting: Hematology

## 2023-01-19 DIAGNOSIS — M5412 Radiculopathy, cervical region: Secondary | ICD-10-CM | POA: Diagnosis not present

## 2023-01-19 DIAGNOSIS — M545 Low back pain, unspecified: Secondary | ICD-10-CM | POA: Diagnosis not present

## 2023-01-19 DIAGNOSIS — G894 Chronic pain syndrome: Secondary | ICD-10-CM | POA: Diagnosis not present

## 2023-01-19 DIAGNOSIS — Z79891 Long term (current) use of opiate analgesic: Secondary | ICD-10-CM | POA: Diagnosis not present

## 2023-01-19 DIAGNOSIS — M25519 Pain in unspecified shoulder: Secondary | ICD-10-CM | POA: Diagnosis not present

## 2023-01-19 DIAGNOSIS — C9001 Multiple myeloma in remission: Secondary | ICD-10-CM | POA: Diagnosis not present

## 2023-01-19 DIAGNOSIS — G890 Central pain syndrome: Secondary | ICD-10-CM | POA: Diagnosis not present

## 2023-01-19 DIAGNOSIS — M546 Pain in thoracic spine: Secondary | ICD-10-CM | POA: Diagnosis not present

## 2023-01-19 DIAGNOSIS — M542 Cervicalgia: Secondary | ICD-10-CM | POA: Diagnosis not present

## 2023-01-21 DIAGNOSIS — R0789 Other chest pain: Secondary | ICD-10-CM | POA: Diagnosis not present

## 2023-02-06 DIAGNOSIS — R5383 Other fatigue: Secondary | ICD-10-CM | POA: Diagnosis not present

## 2023-02-06 DIAGNOSIS — E559 Vitamin D deficiency, unspecified: Secondary | ICD-10-CM | POA: Diagnosis not present

## 2023-02-06 DIAGNOSIS — Z006 Encounter for examination for normal comparison and control in clinical research program: Secondary | ICD-10-CM | POA: Diagnosis not present

## 2023-02-06 DIAGNOSIS — D519 Vitamin B12 deficiency anemia, unspecified: Secondary | ICD-10-CM | POA: Diagnosis not present

## 2023-02-06 DIAGNOSIS — Z5111 Encounter for antineoplastic chemotherapy: Secondary | ICD-10-CM | POA: Diagnosis not present

## 2023-02-06 DIAGNOSIS — C9001 Multiple myeloma in remission: Secondary | ICD-10-CM | POA: Diagnosis not present

## 2023-02-21 DIAGNOSIS — M546 Pain in thoracic spine: Secondary | ICD-10-CM | POA: Diagnosis not present

## 2023-02-21 DIAGNOSIS — M5412 Radiculopathy, cervical region: Secondary | ICD-10-CM | POA: Diagnosis not present

## 2023-02-21 DIAGNOSIS — M25511 Pain in right shoulder: Secondary | ICD-10-CM | POA: Diagnosis not present

## 2023-02-21 DIAGNOSIS — G890 Central pain syndrome: Secondary | ICD-10-CM | POA: Diagnosis not present

## 2023-02-21 DIAGNOSIS — M25512 Pain in left shoulder: Secondary | ICD-10-CM | POA: Diagnosis not present

## 2023-02-21 DIAGNOSIS — G894 Chronic pain syndrome: Secondary | ICD-10-CM | POA: Diagnosis not present

## 2023-02-21 DIAGNOSIS — M542 Cervicalgia: Secondary | ICD-10-CM | POA: Diagnosis not present

## 2023-02-21 DIAGNOSIS — M545 Low back pain, unspecified: Secondary | ICD-10-CM | POA: Diagnosis not present

## 2023-02-21 DIAGNOSIS — Z79891 Long term (current) use of opiate analgesic: Secondary | ICD-10-CM | POA: Diagnosis not present

## 2023-03-06 DIAGNOSIS — C9001 Multiple myeloma in remission: Secondary | ICD-10-CM | POA: Diagnosis not present

## 2023-03-06 DIAGNOSIS — Z006 Encounter for examination for normal comparison and control in clinical research program: Secondary | ICD-10-CM | POA: Diagnosis not present

## 2023-03-06 DIAGNOSIS — Z5111 Encounter for antineoplastic chemotherapy: Secondary | ICD-10-CM | POA: Diagnosis not present

## 2023-03-13 DIAGNOSIS — Z780 Asymptomatic menopausal state: Secondary | ICD-10-CM | POA: Diagnosis not present

## 2023-03-21 DIAGNOSIS — G894 Chronic pain syndrome: Secondary | ICD-10-CM | POA: Diagnosis not present

## 2023-03-21 DIAGNOSIS — M542 Cervicalgia: Secondary | ICD-10-CM | POA: Diagnosis not present

## 2023-03-21 DIAGNOSIS — M25552 Pain in left hip: Secondary | ICD-10-CM | POA: Diagnosis not present

## 2023-03-21 DIAGNOSIS — Z79891 Long term (current) use of opiate analgesic: Secondary | ICD-10-CM | POA: Diagnosis not present

## 2023-03-21 DIAGNOSIS — M25561 Pain in right knee: Secondary | ICD-10-CM | POA: Diagnosis not present

## 2023-03-28 DIAGNOSIS — G894 Chronic pain syndrome: Secondary | ICD-10-CM | POA: Diagnosis not present

## 2023-03-28 DIAGNOSIS — Z79891 Long term (current) use of opiate analgesic: Secondary | ICD-10-CM | POA: Diagnosis not present

## 2023-03-29 DIAGNOSIS — F1199 Opioid use, unspecified with unspecified opioid-induced disorder: Secondary | ICD-10-CM | POA: Diagnosis not present

## 2023-04-03 DIAGNOSIS — F1199 Opioid use, unspecified with unspecified opioid-induced disorder: Secondary | ICD-10-CM | POA: Diagnosis not present

## 2023-04-03 DIAGNOSIS — Z006 Encounter for examination for normal comparison and control in clinical research program: Secondary | ICD-10-CM | POA: Diagnosis not present

## 2023-04-03 DIAGNOSIS — C9001 Multiple myeloma in remission: Secondary | ICD-10-CM | POA: Diagnosis not present

## 2023-04-03 DIAGNOSIS — Z5112 Encounter for antineoplastic immunotherapy: Secondary | ICD-10-CM | POA: Diagnosis not present

## 2023-04-05 DIAGNOSIS — F1199 Opioid use, unspecified with unspecified opioid-induced disorder: Secondary | ICD-10-CM | POA: Diagnosis not present

## 2023-04-05 DIAGNOSIS — C9001 Multiple myeloma in remission: Secondary | ICD-10-CM | POA: Diagnosis not present

## 2023-04-05 DIAGNOSIS — Z006 Encounter for examination for normal comparison and control in clinical research program: Secondary | ICD-10-CM | POA: Diagnosis not present

## 2023-04-06 DIAGNOSIS — G894 Chronic pain syndrome: Secondary | ICD-10-CM | POA: Diagnosis not present

## 2023-04-06 DIAGNOSIS — Z79891 Long term (current) use of opiate analgesic: Secondary | ICD-10-CM | POA: Diagnosis not present

## 2023-04-07 DIAGNOSIS — M542 Cervicalgia: Secondary | ICD-10-CM | POA: Diagnosis not present

## 2023-04-07 DIAGNOSIS — M5412 Radiculopathy, cervical region: Secondary | ICD-10-CM | POA: Diagnosis not present

## 2023-04-07 DIAGNOSIS — M25519 Pain in unspecified shoulder: Secondary | ICD-10-CM | POA: Diagnosis not present

## 2023-04-07 DIAGNOSIS — M546 Pain in thoracic spine: Secondary | ICD-10-CM | POA: Diagnosis not present

## 2023-04-10 DIAGNOSIS — F1199 Opioid use, unspecified with unspecified opioid-induced disorder: Secondary | ICD-10-CM | POA: Diagnosis not present

## 2023-04-12 DIAGNOSIS — F1199 Opioid use, unspecified with unspecified opioid-induced disorder: Secondary | ICD-10-CM | POA: Diagnosis not present

## 2023-04-18 DIAGNOSIS — G894 Chronic pain syndrome: Secondary | ICD-10-CM | POA: Diagnosis not present

## 2023-04-18 DIAGNOSIS — M5412 Radiculopathy, cervical region: Secondary | ICD-10-CM | POA: Diagnosis not present

## 2023-04-18 DIAGNOSIS — M542 Cervicalgia: Secondary | ICD-10-CM | POA: Diagnosis not present

## 2023-04-18 DIAGNOSIS — Z79891 Long term (current) use of opiate analgesic: Secondary | ICD-10-CM | POA: Diagnosis not present

## 2023-04-18 DIAGNOSIS — M545 Low back pain, unspecified: Secondary | ICD-10-CM | POA: Diagnosis not present

## 2023-04-18 DIAGNOSIS — M25519 Pain in unspecified shoulder: Secondary | ICD-10-CM | POA: Diagnosis not present

## 2023-05-31 DIAGNOSIS — F1199 Opioid use, unspecified with unspecified opioid-induced disorder: Secondary | ICD-10-CM | POA: Diagnosis not present

## 2023-06-05 DIAGNOSIS — C9001 Multiple myeloma in remission: Secondary | ICD-10-CM | POA: Diagnosis not present

## 2023-06-05 DIAGNOSIS — Z5111 Encounter for antineoplastic chemotherapy: Secondary | ICD-10-CM | POA: Diagnosis not present

## 2023-06-05 DIAGNOSIS — Z006 Encounter for examination for normal comparison and control in clinical research program: Secondary | ICD-10-CM | POA: Diagnosis not present

## 2023-06-06 DIAGNOSIS — R0981 Nasal congestion: Secondary | ICD-10-CM | POA: Diagnosis not present

## 2023-06-06 DIAGNOSIS — J3489 Other specified disorders of nose and nasal sinuses: Secondary | ICD-10-CM | POA: Diagnosis not present

## 2023-06-06 DIAGNOSIS — J069 Acute upper respiratory infection, unspecified: Secondary | ICD-10-CM | POA: Diagnosis not present

## 2023-06-07 DIAGNOSIS — F1199 Opioid use, unspecified with unspecified opioid-induced disorder: Secondary | ICD-10-CM | POA: Diagnosis not present

## 2023-06-08 DIAGNOSIS — R0981 Nasal congestion: Secondary | ICD-10-CM | POA: Diagnosis not present

## 2023-06-08 DIAGNOSIS — R051 Acute cough: Secondary | ICD-10-CM | POA: Diagnosis not present

## 2023-06-08 DIAGNOSIS — R509 Fever, unspecified: Secondary | ICD-10-CM | POA: Diagnosis not present

## 2023-06-14 DIAGNOSIS — F1199 Opioid use, unspecified with unspecified opioid-induced disorder: Secondary | ICD-10-CM | POA: Diagnosis not present

## 2023-06-21 DIAGNOSIS — F1199 Opioid use, unspecified with unspecified opioid-induced disorder: Secondary | ICD-10-CM | POA: Diagnosis not present

## 2023-06-23 DIAGNOSIS — M545 Low back pain, unspecified: Secondary | ICD-10-CM | POA: Diagnosis not present

## 2023-06-23 DIAGNOSIS — Z79891 Long term (current) use of opiate analgesic: Secondary | ICD-10-CM | POA: Diagnosis not present

## 2023-06-23 DIAGNOSIS — M25519 Pain in unspecified shoulder: Secondary | ICD-10-CM | POA: Diagnosis not present

## 2023-06-23 DIAGNOSIS — M5412 Radiculopathy, cervical region: Secondary | ICD-10-CM | POA: Diagnosis not present

## 2023-06-23 DIAGNOSIS — G894 Chronic pain syndrome: Secondary | ICD-10-CM | POA: Diagnosis not present

## 2023-06-28 DIAGNOSIS — F1199 Opioid use, unspecified with unspecified opioid-induced disorder: Secondary | ICD-10-CM | POA: Diagnosis not present

## 2023-07-03 DIAGNOSIS — F1199 Opioid use, unspecified with unspecified opioid-induced disorder: Secondary | ICD-10-CM | POA: Diagnosis not present

## 2023-07-05 DIAGNOSIS — F1199 Opioid use, unspecified with unspecified opioid-induced disorder: Secondary | ICD-10-CM | POA: Diagnosis not present

## 2023-07-10 DIAGNOSIS — F1199 Opioid use, unspecified with unspecified opioid-induced disorder: Secondary | ICD-10-CM | POA: Diagnosis not present

## 2023-07-12 DIAGNOSIS — F1199 Opioid use, unspecified with unspecified opioid-induced disorder: Secondary | ICD-10-CM | POA: Diagnosis not present

## 2023-07-19 DIAGNOSIS — F1199 Opioid use, unspecified with unspecified opioid-induced disorder: Secondary | ICD-10-CM | POA: Diagnosis not present

## 2023-12-29 ENCOUNTER — Encounter: Payer: Self-pay | Admitting: Hematology

## 2023-12-29 ENCOUNTER — Other Ambulatory Visit: Payer: Self-pay

## 2023-12-29 MED ORDER — FREESTYLE LIBRE 3 PLUS SENSOR MISC
11 refills | Status: AC
  Filled 2023-12-29: qty 2, 30d supply, fill #0

## 2024-01-09 ENCOUNTER — Other Ambulatory Visit: Payer: Self-pay

## 2024-01-29 DIAGNOSIS — Z79899 Other long term (current) drug therapy: Secondary | ICD-10-CM | POA: Diagnosis not present

## 2024-01-30 DIAGNOSIS — Z9181 History of falling: Secondary | ICD-10-CM | POA: Diagnosis not present

## 2024-01-30 DIAGNOSIS — Z79899 Other long term (current) drug therapy: Secondary | ICD-10-CM | POA: Diagnosis not present

## 2024-01-30 DIAGNOSIS — E559 Vitamin D deficiency, unspecified: Secondary | ICD-10-CM | POA: Diagnosis not present

## 2024-01-30 DIAGNOSIS — M8588 Other specified disorders of bone density and structure, other site: Secondary | ICD-10-CM | POA: Diagnosis not present

## 2024-01-30 DIAGNOSIS — M4850XS Collapsed vertebra, not elsewhere classified, site unspecified, sequela of fracture: Secondary | ICD-10-CM | POA: Diagnosis not present

## 2024-01-30 DIAGNOSIS — E7800 Pure hypercholesterolemia, unspecified: Secondary | ICD-10-CM | POA: Diagnosis not present

## 2024-01-30 DIAGNOSIS — J069 Acute upper respiratory infection, unspecified: Secondary | ICD-10-CM | POA: Diagnosis not present

## 2024-01-30 DIAGNOSIS — E11610 Type 2 diabetes mellitus with diabetic neuropathic arthropathy: Secondary | ICD-10-CM | POA: Diagnosis not present

## 2024-02-02 DIAGNOSIS — Z79899 Other long term (current) drug therapy: Secondary | ICD-10-CM | POA: Diagnosis not present

## 2024-02-09 DIAGNOSIS — E1165 Type 2 diabetes mellitus with hyperglycemia: Secondary | ICD-10-CM | POA: Diagnosis not present

## 2024-02-09 DIAGNOSIS — C9000 Multiple myeloma not having achieved remission: Secondary | ICD-10-CM | POA: Diagnosis not present

## 2024-02-09 DIAGNOSIS — E785 Hyperlipidemia, unspecified: Secondary | ICD-10-CM | POA: Diagnosis not present
